# Patient Record
Sex: Female | Born: 1935 | Race: White | Hispanic: No | Marital: Married
Health system: Southern US, Community
[De-identification: ages and names within clinical notes are randomized; demographics above are authoritative.]

## PROBLEM LIST (undated history)

## (undated) DIAGNOSIS — I4891 Unspecified atrial fibrillation: Secondary | ICD-10-CM

## (undated) DIAGNOSIS — K56609 Unspecified intestinal obstruction, unspecified as to partial versus complete obstruction: Secondary | ICD-10-CM

## (undated) DIAGNOSIS — F32A Depression, unspecified: Secondary | ICD-10-CM

## (undated) DIAGNOSIS — Z923 Personal history of irradiation: Secondary | ICD-10-CM

## (undated) DIAGNOSIS — G629 Polyneuropathy, unspecified: Secondary | ICD-10-CM

## (undated) DIAGNOSIS — C55 Malignant neoplasm of uterus, part unspecified: Secondary | ICD-10-CM

## (undated) DIAGNOSIS — Z9049 Acquired absence of other specified parts of digestive tract: Secondary | ICD-10-CM

## (undated) DIAGNOSIS — F329 Major depressive disorder, single episode, unspecified: Secondary | ICD-10-CM

## (undated) DIAGNOSIS — G5602 Carpal tunnel syndrome, left upper limb: Secondary | ICD-10-CM

## (undated) DIAGNOSIS — E559 Vitamin D deficiency, unspecified: Secondary | ICD-10-CM

## (undated) DIAGNOSIS — G4700 Insomnia, unspecified: Secondary | ICD-10-CM

## (undated) DIAGNOSIS — E039 Hypothyroidism, unspecified: Secondary | ICD-10-CM

## (undated) HISTORY — DX: Hypothyroidism, unspecified: E03.9

## (undated) HISTORY — DX: Malignant neoplasm of uterus, part unspecified: C55

## (undated) HISTORY — DX: Acquired absence of other specified parts of digestive tract: Z90.49

## (undated) HISTORY — PX: ABDOMINAL HYSTERECTOMY: SHX81

## (undated) HISTORY — DX: Insomnia, unspecified: G47.00

## (undated) HISTORY — PX: CATARACT EXTRACTION: SUR2

## (undated) HISTORY — DX: Polyneuropathy, unspecified: G62.9

## (undated) HISTORY — DX: Carpal tunnel syndrome, left upper limb: G56.02

## (undated) HISTORY — DX: Major depressive disorder, single episode, unspecified: F32.9

## (undated) HISTORY — DX: Vitamin D deficiency, unspecified: E55.9

## (undated) HISTORY — DX: Personal history of irradiation: Z92.3

## (undated) HISTORY — DX: Depression, unspecified: F32.A

---
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
## 2001-02-10 ENCOUNTER — Other Ambulatory Visit: Admission: RE | Admit: 2001-02-10 | Discharge: 2001-02-10 | Payer: Self-pay | Admitting: Internal Medicine

## 2006-05-12 ENCOUNTER — Emergency Department (HOSPITAL_COMMUNITY): Admission: EM | Admit: 2006-05-12 | Discharge: 2006-05-12 | Payer: Self-pay | Admitting: Emergency Medicine

## 2007-12-10 ENCOUNTER — Emergency Department (HOSPITAL_COMMUNITY): Admission: EM | Admit: 2007-12-10 | Discharge: 2007-12-10 | Payer: Self-pay | Admitting: Emergency Medicine

## 2008-02-13 ENCOUNTER — Encounter: Admission: RE | Admit: 2008-02-13 | Discharge: 2008-03-14 | Payer: Self-pay | Admitting: Neurology

## 2012-01-11 DIAGNOSIS — G63 Polyneuropathy in diseases classified elsewhere: Secondary | ICD-10-CM | POA: Insufficient documentation

## 2012-01-11 DIAGNOSIS — M5137 Other intervertebral disc degeneration, lumbosacral region: Secondary | ICD-10-CM | POA: Insufficient documentation

## 2012-01-11 DIAGNOSIS — G544 Lumbosacral root disorders, not elsewhere classified: Secondary | ICD-10-CM | POA: Insufficient documentation

## 2012-01-11 DIAGNOSIS — R269 Unspecified abnormalities of gait and mobility: Secondary | ICD-10-CM | POA: Insufficient documentation

## 2012-01-11 DIAGNOSIS — R209 Unspecified disturbances of skin sensation: Secondary | ICD-10-CM | POA: Insufficient documentation

## 2012-01-28 ENCOUNTER — Ambulatory Visit: Payer: Medicare HMO

## 2012-01-28 ENCOUNTER — Ambulatory Visit (INDEPENDENT_AMBULATORY_CARE_PROVIDER_SITE_OTHER): Payer: Medicare HMO | Admitting: Family Medicine

## 2012-01-28 VITALS — BP 139/76 | HR 75 | Temp 97.9°F | Resp 16 | Ht 63.0 in | Wt 106.4 lb

## 2012-01-28 DIAGNOSIS — M25572 Pain in left ankle and joints of left foot: Secondary | ICD-10-CM

## 2012-01-28 DIAGNOSIS — S93409A Sprain of unspecified ligament of unspecified ankle, initial encounter: Secondary | ICD-10-CM

## 2012-01-28 DIAGNOSIS — S96919A Strain of unspecified muscle and tendon at ankle and foot level, unspecified foot, initial encounter: Secondary | ICD-10-CM

## 2012-01-28 NOTE — Progress Notes (Signed)
Subjective: Patient has been hurting her left ankle since yesterday. X-rays in the lower left leg just above the ankle. There is no injury. She did go grocery shopping yesterday.  Objective: Mild tenderness of the lower leg laterally along the fibula and Achilles area. Vascular squeezed real hard it hurt her left inner calf, but she is not tender along the vein and she had a negative Homans sign. She thought she was to follow little but I could not appreciate that. No erythema or warmth.[facility] reading (PRIMARY) by  Dr. Sofie Rower of leg was normal. One view of the fibula had little shot of that I think is artifact.  Assessment mild ankle strain  Plan: Swede-O. Ibuprofen 200 3 times a day 2 tablets  Return if worse.

## 2012-01-28 NOTE — Patient Instructions (Signed)
Take ibuprofen 3 times daily for several days.  If increasing pain, redness, or swelling, or generally worsening come back for a recheck. Wear the ankle splint until it is feeling better.

## 2013-05-28 ENCOUNTER — Telehealth: Payer: Self-pay | Admitting: Neurology

## 2013-05-28 NOTE — Telephone Encounter (Signed)
Left vm at contact number requesting that Ms [place] get a referral from her PCP for an appointment with Dr Anne Hahn.  She was last seen by Dr Anne Hahn on 01/01/2010.

## 2013-05-29 NOTE — Telephone Encounter (Signed)
Spoke with patient and confirmed that she was seen by Dr Anne Hahn on 01-11-2012 and that she was to have a f/u appointment in 1 year per Dr Anne Hahn.  I informed her that I would forward this information to Dr Anne Hahn' assistant and our scheduling dept. so that she may be scheduled for a follow-up;  I also informed her that I will be placing her on Dr Anne Hahn' wait-list so that she may be contacted in the event of a cancellation.

## 2013-05-29 NOTE — Telephone Encounter (Signed)
I called and left a message for the patient to callback to the office to schedule an appt.

## 2013-06-04 ENCOUNTER — Encounter: Payer: Self-pay | Admitting: Neurology

## 2013-06-04 DIAGNOSIS — M51379 Other intervertebral disc degeneration, lumbosacral region without mention of lumbar back pain or lower extremity pain: Secondary | ICD-10-CM

## 2013-06-04 DIAGNOSIS — R269 Unspecified abnormalities of gait and mobility: Secondary | ICD-10-CM

## 2013-06-04 DIAGNOSIS — G63 Polyneuropathy in diseases classified elsewhere: Secondary | ICD-10-CM

## 2013-06-04 DIAGNOSIS — M5137 Other intervertebral disc degeneration, lumbosacral region: Secondary | ICD-10-CM

## 2013-06-04 DIAGNOSIS — R209 Unspecified disturbances of skin sensation: Secondary | ICD-10-CM

## 2013-06-04 DIAGNOSIS — G544 Lumbosacral root disorders, not elsewhere classified: Secondary | ICD-10-CM

## 2013-06-05 ENCOUNTER — Encounter: Payer: Self-pay | Admitting: Neurology

## 2013-06-05 ENCOUNTER — Ambulatory Visit (INDEPENDENT_AMBULATORY_CARE_PROVIDER_SITE_OTHER): Payer: Medicare HMO | Admitting: Neurology

## 2013-06-05 VITALS — BP 154/85 | HR 86 | Wt 103.0 lb

## 2013-06-05 DIAGNOSIS — R269 Unspecified abnormalities of gait and mobility: Secondary | ICD-10-CM

## 2013-06-05 DIAGNOSIS — G63 Polyneuropathy in diseases classified elsewhere: Secondary | ICD-10-CM

## 2013-06-05 NOTE — Progress Notes (Signed)
Reason for visit: Peripheral neuropathy  Teresa Gonzalez is an 79 y.o. female  History of present illness:  Teresa Gonzalez is an 79 year old right-handed white female with a history of a peripheral neuropathy. Over time, Teresa Gonzalez indicates that Teresa numbness of Teresa leg has extended, and she now senses Teresa numbness to just below Teresa knees bilaterally. Teresa Gonzalez indicates that she stopped driving approximately one year ago because she could no longer feel her feet. Teresa Gonzalez has had one fall recently within Teresa last 2 weeks when she was coming downstairs, Teresa Gonzalez slipped and landed on her buttocks. Teresa Gonzalez fortunately did not sustain significant injury. Teresa Gonzalez has occasional left knee pain at night with sleeping. Teresa Gonzalez reports some occasional right hip pain with walking. Teresa Gonzalez otherwise has no discomfort with Teresa peripheral neuropathy, and she has never gone medications for Teresa neuropathy pain. Teresa Gonzalez has chronic insomnia unrelated to discomfort.  Past Medical History  Diagnosis Date  . Peripheral neuropathy   . Hypothyroidism   . Depression   . Uterine cancer   . Status post radiation therapy   . S/P small bowel resection     with Diarrhea  . Left carpal tunnel syndrome     By nerve conduction study  . Vitamin D deficiency   . Depression   . Insomnia     Past Surgical History  Procedure Laterality Date  . Cataract extraction Bilateral   . Abdominal hysterectomy      Family History  Problem Relation Age of Onset  . Stroke Mother   . Aneurysm Father   . Diabetes Brother     Social history:  reports that she has never smoked. She has never used smokeless tobacco. She reports that she does not drink alcohol or use illicit drugs.    Allergies  Allergen Reactions  . Codeine Itching  . Penicillins Swelling    Medications:  Current Outpatient Prescriptions on File Prior to Visit  Medication Sig Dispense Refill  . aspirin 81 MG EC tablet Take 81  mg by mouth daily. Swallow whole.      . Calcium Carbonate (CALTRATE 600 PO) Take 600 mg by mouth daily.       Marland Kitchen levothyroxine (SYNTHROID, LEVOTHROID) 25 MCG tablet Take 25 mcg by mouth daily.      Marland Kitchen lisinopril (PRINIVIL,ZESTRIL) 10 MG tablet Take 10 mg by mouth daily.      . Multiple Vitamins-Minerals (MULTIVITAMIN WITH MINERALS) tablet Take 1 tablet by mouth daily.      . raloxifene (EVISTA) 60 MG tablet Take 60 mg by mouth daily.      . Vitamin D, Ergocalciferol, (DRISDOL) 50000 UNITS CAPS Take 50,000 Units by mouth every 7 (seven) days.        No current facility-administered medications on file prior to visit.    ROS:  Out of a complete 14 system review of symptoms, Teresa Gonzalez complains only of Teresa following symptoms, and all other reviewed systems are negative.  Left knee pain Numbness in Teresa feet Gait disturbance  Blood pressure 154/85, pulse 86, weight 103 lb (46.72 kg).  Physical Exam  General: Teresa Gonzalez is alert and cooperative at Teresa time of Teresa examination.  Skin: No significant peripheral edema is noted.   Neurologic Exam  Cranial nerves: Facial symmetry is present. Speech is normal, no aphasia or dysarthria is noted. Extraocular movements are full. Visual fields are full.  Motor: Teresa Gonzalez has good strength in all 4  extremities.  Coordination: Teresa Gonzalez has good finger-nose-finger and heel-to-shin bilaterally.  Gait and station: Teresa Gonzalez has a slightly wide-based, unsteady gait. Teresa Gonzalez uses a walker for ambulation. Tandem gait was not attempted. Romberg is negative. No drift is seen.  Reflexes: Deep tendon reflexes are symmetric, but are depressed.   Assessment/Plan:  One. Peripheral neuropathy  2. Gait disturbance  Teresa Gonzalez is safe when using a walker. Teresa Gonzalez has assistance in Teresa home environment. Teresa Gonzalez is not having discomfort with Teresa neuropathy, and there is no indication for medical therapy. Teresa Gonzalez is no longer  operating a motor vehicle. Teresa Gonzalez will followup in one year.  Marlan Palau MD 06/05/2013 12:59 PM  [facility] [address] [place], [place] [address]  Phone [phone] Fax [phone]

## 2013-10-20 ENCOUNTER — Encounter (HOSPITAL_COMMUNITY): Payer: Self-pay | Admitting: Emergency Medicine

## 2013-10-20 ENCOUNTER — Inpatient Hospital Stay (HOSPITAL_COMMUNITY)
Admission: EM | Admit: 2013-10-20 | Discharge: 2013-10-23 | DRG: 389 | Disposition: A | Discharge status: Home / Self Care | Payer: Medicare HMO | Attending: Internal Medicine | Admitting: Internal Medicine

## 2013-10-20 ENCOUNTER — Emergency Department (HOSPITAL_COMMUNITY): Payer: Medicare HMO

## 2013-10-20 DIAGNOSIS — I10 Essential (primary) hypertension: Secondary | ICD-10-CM | POA: Diagnosis present

## 2013-10-20 DIAGNOSIS — R636 Underweight: Secondary | ICD-10-CM | POA: Diagnosis present

## 2013-10-20 DIAGNOSIS — G5600 Carpal tunnel syndrome, unspecified upper limb: Secondary | ICD-10-CM | POA: Diagnosis present

## 2013-10-20 DIAGNOSIS — M810 Age-related osteoporosis without current pathological fracture: Secondary | ICD-10-CM | POA: Diagnosis present

## 2013-10-20 DIAGNOSIS — Z9049 Acquired absence of other specified parts of digestive tract: Secondary | ICD-10-CM

## 2013-10-20 DIAGNOSIS — Z7982 Long term (current) use of aspirin: Secondary | ICD-10-CM

## 2013-10-20 DIAGNOSIS — K560 Paralytic ileus: Secondary | ICD-10-CM | POA: Diagnosis present

## 2013-10-20 DIAGNOSIS — Z90710 Acquired absence of both cervix and uterus: Secondary | ICD-10-CM

## 2013-10-20 DIAGNOSIS — E039 Hypothyroidism, unspecified: Secondary | ICD-10-CM | POA: Diagnosis present

## 2013-10-20 DIAGNOSIS — K56609 Unspecified intestinal obstruction, unspecified as to partial versus complete obstruction: Principal | ICD-10-CM

## 2013-10-20 DIAGNOSIS — Z880 Allergy status to penicillin: Secondary | ICD-10-CM

## 2013-10-20 DIAGNOSIS — F3289 Other specified depressive episodes: Secondary | ICD-10-CM | POA: Diagnosis present

## 2013-10-20 DIAGNOSIS — K912 Postsurgical malabsorption, not elsewhere classified: Secondary | ICD-10-CM | POA: Diagnosis present

## 2013-10-20 DIAGNOSIS — Z923 Personal history of irradiation: Secondary | ICD-10-CM

## 2013-10-20 DIAGNOSIS — G4700 Insomnia, unspecified: Secondary | ICD-10-CM | POA: Diagnosis present

## 2013-10-20 DIAGNOSIS — R197 Diarrhea, unspecified: Secondary | ICD-10-CM | POA: Diagnosis present

## 2013-10-20 DIAGNOSIS — E559 Vitamin D deficiency, unspecified: Secondary | ICD-10-CM | POA: Diagnosis present

## 2013-10-20 DIAGNOSIS — Z681 Body mass index (BMI) 19 or less, adult: Secondary | ICD-10-CM

## 2013-10-20 DIAGNOSIS — Z79899 Other long term (current) drug therapy: Secondary | ICD-10-CM

## 2013-10-20 DIAGNOSIS — Z8542 Personal history of malignant neoplasm of other parts of uterus: Secondary | ICD-10-CM

## 2013-10-20 DIAGNOSIS — Z885 Allergy status to narcotic agent status: Secondary | ICD-10-CM

## 2013-10-20 DIAGNOSIS — G609 Hereditary and idiopathic neuropathy, unspecified: Secondary | ICD-10-CM | POA: Diagnosis present

## 2013-10-20 DIAGNOSIS — F329 Major depressive disorder, single episode, unspecified: Secondary | ICD-10-CM | POA: Diagnosis present

## 2013-10-20 LAB — CBC WITH DIFFERENTIAL/PLATELET
BASOS PCT: 0 % (ref 0–1)
Basophils Absolute: 0 10*3/uL (ref 0.0–0.1)
EOS ABS: 0.1 10*3/uL (ref 0.0–0.7)
Eosinophils Relative: 1 % (ref 0–5)
HEMATOCRIT: 42.7 % (ref 36.0–46.0)
HEMOGLOBIN: 15 g/dL (ref 12.0–15.0)
Lymphocytes Relative: 17 % (ref 12–46)
Lymphs Abs: 1.7 10*3/uL (ref 0.7–4.0)
MCH: 31.3 pg (ref 26.0–34.0)
MCHC: 35.1 g/dL (ref 30.0–36.0)
MCV: 89.1 fL (ref 78.0–100.0)
MONO ABS: 0.7 10*3/uL (ref 0.1–1.0)
MONOS PCT: 8 % (ref 3–12)
Neutro Abs: 7.3 10*3/uL (ref 1.7–7.7)
Neutrophils Relative %: 74 % (ref 43–77)
Platelets: 316 10*3/uL (ref 150–400)
RBC: 4.79 MIL/uL (ref 3.87–5.11)
RDW: 13 % (ref 11.5–15.5)
WBC: 9.8 10*3/uL (ref 4.0–10.5)

## 2013-10-20 LAB — POCT I-STAT TROPONIN I: Troponin i, poc: 0 ng/mL (ref 0.00–0.08)

## 2013-10-20 LAB — URINE MICROSCOPIC-ADD ON

## 2013-10-20 LAB — COMPREHENSIVE METABOLIC PANEL
ALBUMIN: 3.7 g/dL (ref 3.5–5.2)
ALT: 15 U/L (ref 0–35)
AST: 19 U/L (ref 0–37)
Alkaline Phosphatase: 64 U/L (ref 39–117)
BILIRUBIN TOTAL: 0.6 mg/dL (ref 0.3–1.2)
BUN: 13 mg/dL (ref 6–23)
CO2: 27 mEq/L (ref 19–32)
CREATININE: 0.58 mg/dL (ref 0.50–1.10)
Calcium: 9.4 mg/dL (ref 8.4–10.5)
Chloride: 93 mEq/L — ABNORMAL LOW (ref 96–112)
GFR calc Af Amer: >90 mL/min (ref >90)
GFR calc non Af Amer: 79 mL/min — ABNORMAL LOW (ref >90)
Glucose, Bld: 132 mg/dL — ABNORMAL HIGH (ref 70–99)
Potassium: 3.9 mEq/L (ref 3.7–5.3)
Sodium: 133 mEq/L — ABNORMAL LOW (ref 137–147)
TOTAL PROTEIN: 6.8 g/dL (ref 6.0–8.3)

## 2013-10-20 LAB — URINALYSIS, ROUTINE W REFLEX MICROSCOPIC
Glucose, UA: NEGATIVE mg/dL
Hgb urine dipstick: NEGATIVE
KETONES UR: NEGATIVE mg/dL
NITRITE: NEGATIVE
PH: 6 (ref 5.0–8.0)
Protein, ur: NEGATIVE mg/dL
SPECIFIC GRAVITY, URINE: 1.021 (ref 1.005–1.030)
UROBILINOGEN UA: 0.2 mg/dL (ref 0.0–1.0)

## 2013-10-20 LAB — LIPASE, BLOOD: LIPASE: 47 U/L (ref 11–59)

## 2013-10-20 LAB — CG4 I-STAT (LACTIC ACID): Lactic Acid, Venous: 1.92 mmol/L (ref 0.5–2.2)

## 2013-10-20 MED ORDER — ONDANSETRON HCL 4 MG PO TABS: 4.0000 mg | ORAL_TABLET | Freq: Four times a day (QID) | ORAL | Status: DC | PRN

## 2013-10-20 MED ORDER — SODIUM CHLORIDE 0.9 % IV SOLN
INTRAVENOUS | Status: AC
  Administered 2013-10-20: 18:00:00 via INTRAVENOUS
  Administered 2013-10-21: 1000 mL via INTRAVENOUS
  Administered 2013-10-21 – 2013-10-22 (×2): via INTRAVENOUS

## 2013-10-20 MED ORDER — HEPARIN SODIUM (PORCINE) 5000 UNIT/ML IJ SOLN
5000.0000 [IU] | Freq: Three times a day (TID) | INTRAMUSCULAR | Status: DC
  Administered 2013-10-20 – 2013-10-23 (×8): 5000 [IU] via SUBCUTANEOUS
  Filled 2013-10-20 (×11): qty 1

## 2013-10-20 MED ORDER — PANTOPRAZOLE SODIUM 40 MG IV SOLR
40.0000 mg | INTRAVENOUS | Status: DC
Start: 2013-10-20 — End: 2013-10-21
  Administered 2013-10-20: 40 mg via INTRAVENOUS
  Filled 2013-10-20 (×2): qty 40

## 2013-10-20 MED ORDER — ONDANSETRON HCL 4 MG/2ML IJ SOLN
4.0000 mg | Freq: Once | INTRAMUSCULAR | Status: AC
  Administered 2013-10-20: 4 mg via INTRAVENOUS

## 2013-10-20 MED ORDER — FENTANYL CITRATE 0.05 MG/ML IJ SOLN
50.0000 ug | Freq: Once | INTRAMUSCULAR | Status: AC
  Administered 2013-10-20: 50 ug via INTRAVENOUS
  Filled 2013-10-20: qty 2

## 2013-10-20 MED ORDER — ONDANSETRON HCL 4 MG/2ML IJ SOLN
4.0000 mg | Freq: Four times a day (QID) | INTRAMUSCULAR | Status: DC | PRN
  Filled 2013-10-20: qty 2

## 2013-10-20 MED ORDER — MORPHINE SULFATE 2 MG/ML IJ SOLN
1.0000 mg | INTRAMUSCULAR | Status: DC | PRN
  Administered 2013-10-20: 1 mg via INTRAVENOUS
  Filled 2013-10-20: qty 1

## 2013-10-20 NOTE — Consult Note (Signed)
Reason for Consult:  Small bowel obstruction Referring Physician:   Dr. Shon Baton  Teresa Gonzalez is an 80 y.o. female.  HPI:  Last night, she developed some discomfort in the right lower abdominal area as well as epigastric pain and burning. She has had some episodes like this before. The pain was persistent and severe at times. She had one episode of nausea with some vomiting.  Her bowels have still been moving. She does have chronic diarrhea and takes Imodium for this. She's not noted a significant change in her bowel habits. She presented to the emergency department for evaluation. Abdominal x-rays were suggestive of a partial small bowel obstruction and she has been admitted for this. She has a history of a small bowel resection back in 1989 as well as a hysterectomy for malignancy in 1969. She underwent radiation treatment after the hysterectomy.  Past Medical History  Diagnosis Date  . Peripheral neuropathy   . Hypothyroidism   . Depression   . Uterine cancer   . Status post radiation therapy   . S/P small bowel resection     with Diarrhea  . Left carpal tunnel syndrome     By nerve conduction study  . Vitamin D deficiency   . Depression   . Insomnia     Past Surgical History  Procedure Laterality Date  . Cataract extraction Bilateral   . Abdominal hysterectomy      Family History  Problem Relation Age of Onset  . Stroke Mother   . Aneurysm Father   . Diabetes Brother     Social History:  reports that she has never smoked. She has never used smokeless tobacco. She reports that she does not drink alcohol or use illicit drugs.  Allergies:  Allergies  Allergen Reactions  . Codeine Itching  . Penicillins Swelling    Prior to Admission medications   Medication Sig Start Date End Date Taking? Authorizing Provider  aspirin 81 MG EC tablet Take 81 mg by mouth daily. Swallow whole.   Yes Historical Provider, MD  Calcium Carbonate (CALTRATE 600 PO) Take 600 mg by mouth  daily.    Yes Historical Provider, MD  famotidine-calcium carbonate-magnesium hydroxide (ACID CONTROLLER COMPLETE) 10-800-165 MG CHEW chewable tablet Chew 1 tablet by mouth daily as needed.   Yes Historical Provider, MD  levothyroxine (SYNTHROID, LEVOTHROID) 25 MCG tablet Take 25 mcg by mouth daily.   Yes Historical Provider, MD  lisinopril (PRINIVIL,ZESTRIL) 10 MG tablet Take 10 mg by mouth daily.   Yes Historical Provider, MD  loperamide (IMODIUM) 2 MG capsule Take 16 mg by mouth daily.   Yes Historical Provider, MD  Multiple Vitamins-Minerals (MULTIVITAMIN WITH MINERALS) tablet Take 1 tablet by mouth daily.   Yes Historical Provider, MD  raloxifene (EVISTA) 60 MG tablet Take 60 mg by mouth daily.   Yes Historical Provider, MD  Vitamin D, Ergocalciferol, (DRISDOL) 50000 UNITS CAPS Take 50,000 Units by mouth every 7 (seven) days.    Yes Historical Provider, MD     Results for orders placed during the hospital encounter of 10/20/13 (from the past 48 hour(s))  CBC WITH DIFFERENTIAL     Status: None   Collection Time    10/20/13  4:10 PM      Result Value Range   WBC 9.8  4.0 - 10.5 K/uL   RBC 4.79  3.87 - 5.11 MIL/uL   Hemoglobin 15.0  12.0 - 15.0 g/dL   HCT 42.7  36.0 - 46.0 %  MCV 89.1  78.0 - 100.0 fL   MCH 31.3  26.0 - 34.0 pg   MCHC 35.1  30.0 - 36.0 g/dL   RDW 13.0  11.5 - 15.5 %   Platelets 316  150 - 400 K/uL   Neutrophils Relative % 74  43 - 77 %   Neutro Abs 7.3  1.7 - 7.7 K/uL   Lymphocytes Relative 17  12 - 46 %   Lymphs Abs 1.7  0.7 - 4.0 K/uL   Monocytes Relative 8  3 - 12 %   Monocytes Absolute 0.7  0.1 - 1.0 K/uL   Eosinophils Relative 1  0 - 5 %   Eosinophils Absolute 0.1  0.0 - 0.7 K/uL   Basophils Relative 0  0 - 1 %   Basophils Absolute 0.0  0.0 - 0.1 K/uL  COMPREHENSIVE METABOLIC PANEL     Status: Abnormal   Collection Time    10/20/13  4:10 PM      Result Value Range   Sodium 133 (*) 137 - 147 mEq/L   Potassium 3.9  3.7 - 5.3 mEq/L   Chloride 93 (*) 96 -  112 mEq/L   CO2 27  19 - 32 mEq/L   Glucose, Bld 132 (*) 70 - 99 mg/dL   BUN 13  6 - 23 mg/dL   Creatinine, Ser 0.58  0.50 - 1.10 mg/dL   Calcium 9.4  8.4 - 10.5 mg/dL   Total Protein 6.8  6.0 - 8.3 g/dL   Albumin 3.7  3.5 - 5.2 g/dL   AST 19  0 - 37 U/L   ALT 15  0 - 35 U/L   Alkaline Phosphatase 64  39 - 117 U/L   Total Bilirubin 0.6  0.3 - 1.2 mg/dL   GFR calc non Af Amer 79 (*) >90 mL/min   GFR calc Af Amer >90  >90 mL/min   Comment: (NOTE)     The eGFR has been calculated using the CKD EPI equation.     This calculation has not been validated in all clinical situations.     eGFR's persistently <90 mL/min signify possible Chronic Kidney     Disease.  LIPASE, BLOOD     Status: None   Collection Time    10/20/13  4:10 PM      Result Value Range   Lipase 47  11 - 59 U/L  POCT I-STAT TROPONIN I     Status: None   Collection Time    10/20/13  4:17 PM      Result Value Range   Troponin i, poc 0.00  0.00 - 0.08 ng/mL   Comment 3            Comment: Due to the release kinetics of cTnI,     a negative result within the first hours     of the onset of symptoms does not rule out     myocardial infarction with certainty.     If myocardial infarction is still suspected,     repeat the test at appropriate intervals.  CG4 I-STAT (LACTIC ACID)     Status: None   Collection Time    10/20/13  4:19 PM      Result Value Range   Lactic Acid, Venous 1.92  0.5 - 2.2 mmol/L  URINALYSIS, ROUTINE W REFLEX MICROSCOPIC     Status: Abnormal   Collection Time    10/20/13  4:36 PM      Result Value Range  Color, Urine AMBER (*) YELLOW   Comment: BIOCHEMICALS MAY BE AFFECTED BY COLOR   APPearance CLOUDY (*) CLEAR   Specific Gravity, Urine 1.021  1.005 - 1.030   pH 6.0  5.0 - 8.0   Glucose, UA NEGATIVE  NEGATIVE mg/dL   Hgb urine dipstick NEGATIVE  NEGATIVE   Bilirubin Urine SMALL (*) NEGATIVE   Ketones, ur NEGATIVE  NEGATIVE mg/dL   Protein, ur NEGATIVE  NEGATIVE mg/dL   Urobilinogen, UA  0.2  0.0 - 1.0 mg/dL   Nitrite NEGATIVE  NEGATIVE   Leukocytes, UA SMALL (*) NEGATIVE  URINE MICROSCOPIC-ADD ON     Status: Abnormal   Collection Time    10/20/13  4:36 PM      Result Value Range   WBC, UA 0-2  <3 WBC/hpf   Bacteria, UA FEW (*) RARE   Crystals CA OXALATE CRYSTALS (*) NEGATIVE    Dg Abd Acute W/chest  10/20/2013   CLINICAL DATA:  Abdominal pain, history of uterine cancer  EXAM: ACUTE ABDOMEN SERIES (ABDOMEN 2 VIEW & CHEST 1 VIEW)  COMPARISON:  None.  FINDINGS: Cardiomediastinal silhouette is unremarkable. No acute infiltrate or pleural effusion. No pulmonary edema. Mild elevation of the right hemidiaphragm. Moderate stool in right colon and sigmoid colon. Distended small bowel loops with air-fluid levels in right abdomen highly suspicious for bowel obstruction. No free abdominal air.  IMPRESSION: No acute disease within chest. Distended small bowel loops in right abdomen with air-fluid levels highly suspicious for small bowel obstruction. No free abdominal air.   Electronically Signed   By: Lahoma Crocker M.D.   On: 10/20/2013 16:00    Review of Systems  Constitutional: Negative for fever and chills.  Respiratory: Positive for cough. Negative for shortness of breath.   Cardiovascular: Positive for chest pain (Burning pain).  Gastrointestinal: Positive for nausea, vomiting and diarrhea. Negative for blood in stool.  Genitourinary: Negative for dysuria and hematuria.   Blood pressure 155/92, pulse 93, temperature 98.2 F (36.8 C), temperature source Oral, resp. rate 20, height '5\' 4"'  (1.626 m), weight 103 lb (46.72 kg), SpO2 98.00%. Physical Exam  Constitutional: No distress.  Thin, elderly female.  HENT:  Head: Normocephalic and atraumatic.  Eyes: No scleral icterus.  Cardiovascular: Normal rate and regular rhythm.   Respiratory: Effort normal and breath sounds normal.  GI: Soft. Bowel sounds are normal. She exhibits no distension and no mass. There is no tenderness.   Lower midline scar. No hernias.  Genitourinary:  No palpable groin hernias.    Assessment/Plan: Abdominal pain with some nausea and one episode of small volume emesis. She's had multiple abdominal surgeries as well as pelvic radiation. X-rays suggest partial small bowel obstruction. No clinical evidence of ischemia.  Recommendation: I agree with bowel rest. If this does not improve quickly with medical management, would recommend a CT scan with oral and IV contrast.  Moishy Laday J 10/20/2013, 7:39 PM

## 2013-10-20 NOTE — ED Provider Notes (Signed)
Medical screening examination/treatment/procedure(s) were conducted as a shared visit with non-physician practitioner(s) and myself.  I personally evaluated the patient during the encounter.  EKG Interpretation    Date/Time:  Saturday October 20 2013 14:53:54 EST Ventricular Rate:  122 PR Interval:  202 QRS Duration: 122 QT Interval:  410 QTC Calculation: 584 R Axis:   -54 Text Interpretation:  Sinus tachycardia Borderline prolonged PR interval LAE, consider biatrial enlargement Left bundle branch block Baseline wander in lead(s) I III aVL V4 Confirmed by Zenia Resides  MD, Fawna Cranmer ([id number]) on 10/20/2013 3:22:26 PM             Leota Jacobsen, MD 10/20/13 [phone]

## 2013-10-20 NOTE — ED Notes (Signed)
Pt complains of lower right abdominal pain since yesterday sharp in nature.. Pt also  complains of epigastric burning. Pt was given 4 mg of Zofran and 50 meq of fentanyl in route by EMS.  Pt states she had a small bowel movement this AM which is abnormal for her because she usually has diarrhea daily and has to take 12 of imodium for her symptoms. Pt states she has chronic abdominal pain but this pain  has lasted longer than usual.

## 2013-10-20 NOTE — ED Provider Notes (Signed)
Medical screening examination/treatment/procedure(s) were performed by non-physician practitioner and as supervising physician I was immediately available for consultation/collaboration.  EKG Interpretation    Date/Time:  Saturday October 20 2013 14:53:54 EST Ventricular Rate:  122 PR Interval:  202 QRS Duration: 122 QT Interval:  410 QTC Calculation: 584 R Axis:   -54 Text Interpretation:  Sinus tachycardia Borderline prolonged PR interval LAE, consider biatrial enlargement Left bundle branch block Baseline wander in lead(s) I III aVL V4 Confirmed by Stiven Kaspar  MD, Edynn Gillock ([id number]) on 10/20/2013 3:22:26 PM            Pt without signs of acute abd , has sbo and will be admitted by medicine  Leota Jacobsen, MD 10/20/13 1725

## 2013-10-20 NOTE — Progress Notes (Signed)
Called to get report and Nurse unavailable to give report.

## 2013-10-20 NOTE — ED Provider Notes (Signed)
CSN: [id number]     Arrival date & time 10/20/13  1441 History   First MD Initiated Contact with Patient 10/20/13 1514     No chief complaint on file.  (Consider location/radiation/quality/duration/timing/severity/associated sxs/prior Treatment) HPI Comments: Patient with h/o small bowel resection, uterine cancer s/p radiation and abdominal hysterectomy, appendectomy (reported by patient) -- presents with c/o lower R abdominal pain and epigastric chest burning sensation starting last night. Patient took Tums without relief. She had vomited overnight. Patient has daily diarrhea but had a slightly hard, small BM this morning. She denies fever, dysuria. EMS was called and patient received 17mcg fentanyl and 4mg  zofran en route which has resolved pain. No history of heart or lung problems. The onset of this condition was acute. The course is constant. Aggravating factors: none. Alleviating factors: none.    The history is provided by the patient and medical records.    Past Medical History  Diagnosis Date  . Peripheral neuropathy   . Hypothyroidism   . Depression   . Uterine cancer   . Status post radiation therapy   . S/P small bowel resection     with Diarrhea  . Left carpal tunnel syndrome     By nerve conduction study  . Vitamin D deficiency   . Depression   . Insomnia    Past Surgical History  Procedure Laterality Date  . Cataract extraction Bilateral   . Abdominal hysterectomy     Family History  Problem Relation Age of Onset  . Stroke Mother   . Aneurysm Father   . Diabetes Brother    History  Substance Use Topics  . Smoking status: Never Smoker   . Smokeless tobacco: Never Used  . Alcohol Use: No   OB History   Grav Para Term Preterm Abortions TAB SAB Ect Mult Living                 Review of Systems  Constitutional: Negative for fever.  HENT: Negative for rhinorrhea and sore throat.   Eyes: Negative for redness.  Respiratory: Negative for cough.    Cardiovascular: Negative for chest pain.  Gastrointestinal: Positive for nausea, vomiting and abdominal pain. Negative for diarrhea and blood in stool.  Genitourinary: Negative for dysuria.  Musculoskeletal: Negative for myalgias.  Skin: Negative for rash.  Neurological: Negative for headaches.    Allergies  Codeine and Penicillins  Home Medications   Current Outpatient Rx  Name  Route  Sig  Dispense  Refill  . aspirin 81 MG EC tablet   Oral   Take 81 mg by mouth daily. Swallow whole.         . Calcium Carbonate (CALTRATE 600 PO)   Oral   Take 600 mg by mouth daily.          . famotidine-calcium carbonate-magnesium hydroxide (ACID CONTROLLER COMPLETE) 10-800-165 MG CHEW chewable tablet   Oral   Chew 1 tablet by mouth daily as needed.         Marland Kitchen levothyroxine (SYNTHROID, LEVOTHROID) 25 MCG tablet   Oral   Take 25 mcg by mouth daily.         Marland Kitchen lisinopril (PRINIVIL,ZESTRIL) 10 MG tablet   Oral   Take 10 mg by mouth daily.         Marland Kitchen loperamide (IMODIUM) 2 MG capsule   Oral   Take 16 mg by mouth daily.         . Multiple Vitamins-Minerals (MULTIVITAMIN WITH MINERALS) tablet   Oral  Take 1 tablet by mouth daily.         . raloxifene (EVISTA) 60 MG tablet   Oral   Take 60 mg by mouth daily.         . Vitamin D, Ergocalciferol, (DRISDOL) 50000 UNITS CAPS   Oral   Take 50,000 Units by mouth every 7 (seven) days.           BP 164/96  Pulse 123  Temp(Src) 97.7 F (36.5 C) (Oral)  Resp 18  SpO2 98% Physical Exam  Nursing note and vitals reviewed. Constitutional: She appears well-developed and well-nourished.  HENT:  Head: Normocephalic and atraumatic.  Eyes: Conjunctivae are normal. Right eye exhibits no discharge. Left eye exhibits no discharge.  Neck: Normal range of motion. Neck supple.  Cardiovascular: Normal rate, regular rhythm and normal heart sounds.   Pulmonary/Chest: Effort normal and breath sounds normal.  Abdominal: Soft. Bowel  sounds are normal. She exhibits no distension. There is no tenderness. There is no rebound and no guarding.  Neurological: She is alert.  Skin: Skin is warm and dry.  Psychiatric: She has a normal mood and affect.    ED Course  Procedures (including critical care time) Labs Review Labs Reviewed  COMPREHENSIVE METABOLIC PANEL - Abnormal; Notable for the following:    Sodium 133 (*)    Chloride 93 (*)    Glucose, Bld 132 (*)    GFR calc non Af Amer 79 (*)    All other components within normal limits  URINALYSIS, ROUTINE W REFLEX MICROSCOPIC - Abnormal; Notable for the following:    Color, Urine AMBER (*)    APPearance CLOUDY (*)    Bilirubin Urine SMALL (*)    Leukocytes, UA SMALL (*)    All other components within normal limits  URINE MICROSCOPIC-ADD ON - Abnormal; Notable for the following:    Bacteria, UA FEW (*)    Crystals CA OXALATE CRYSTALS (*)    All other components within normal limits  CBC WITH DIFFERENTIAL  LIPASE, BLOOD  BASIC METABOLIC PANEL  CBC  CG4 I-STAT (LACTIC ACID)  POCT I-STAT TROPONIN I   Imaging Review Dg Abd Acute W/chest  10/20/2013   CLINICAL DATA:  Abdominal pain, history of uterine cancer  EXAM: ACUTE ABDOMEN SERIES (ABDOMEN 2 VIEW & CHEST 1 VIEW)  COMPARISON:  None.  FINDINGS: Cardiomediastinal silhouette is unremarkable. No acute infiltrate or pleural effusion. No pulmonary edema. Mild elevation of the right hemidiaphragm. Moderate stool in right colon and sigmoid colon. Distended small bowel loops with air-fluid levels in right abdomen highly suspicious for bowel obstruction. No free abdominal air.  IMPRESSION: No acute disease within chest. Distended small bowel loops in right abdomen with air-fluid levels highly suspicious for small bowel obstruction. No free abdominal air.   Electronically Signed   By: Lahoma Crocker M.D.   On: 10/20/2013 16:00    EKG Interpretation    Date/Time:  Saturday October 20 2013 14:53:54 EST Ventricular Rate:  122 PR  Interval:  202 QRS Duration: 122 QT Interval:  410 QTC Calculation: 584 R Axis:   -54 Text Interpretation:  Sinus tachycardia Borderline prolonged PR interval LAE, consider biatrial enlargement Left bundle branch block Baseline wander in lead(s) I III aVL V4 Confirmed by Zenia Resides  MD, ANTHONY ([id number]) on 10/20/2013 3:22:26 PM           3:31 PM Patient seen and examined. Work-up initiated. Medications ordered.   Vital signs reviewed and are as follows: Filed Vitals:  10/20/13 1452  BP:   Pulse:   Temp: 97.7 F (36.5 C)  Resp:   BP 164/96  Pulse 123  Temp(Src) 97.7 F (36.5 C) (Oral)  Resp 18  SpO2 98%  4:11 PM GMA already in ED and notified of patient. Patient discussed with and seen by Dr. Zenia Resides. They will see and admit.   4:52 PM Remainder of labs reviewed (other than urine which has not returned). No other acute medical issues identified.   MDM   1. Small bowel obstruction    Admit for treatment of SBO.     Carlisle Cater, PA-C 10/20/13 1724

## 2013-10-20 NOTE — ED Notes (Signed)
Patient transported to X-ray 

## 2013-10-20 NOTE — ED Notes (Signed)
Bed: WA01 Expected date:  Expected time:  Means of arrival:  Comments: EMS- elderly abdominal pain

## 2013-10-20 NOTE — H&P (Addendum)
Teresa Gonzalez is an 80 y.o. female.   PCP:   [name], MD   Chief Complaint:  Ab pain  HPI: 35 F s/p Hysterectomy for uterine CA followed c XRT.  She had partial Small Bowel resection many yrs ago and has had chronic diarrhea since - due to short gut syndrome.  She also has developed a peripheral neuropathy as well. She has been doing well in her usual state of health until yesterday when she was not quite right.  Last night she started having Ab discomfort and pains.  She had one small BM this am.  She held her imodium.  Tums was unhelpful. The pains are described as lower right abdominal pain and epigastric burning. The pains progressed and worsened and she called EMS. Pt was given 4 mg of Zofran and 50 meq of fentanyl in route by EMS. She denies fever, dysuria.  No history of heart or lung problems. She has not done anything out of the ordinary.  In ED labs were unremarkable.  No Pancreatitis and AAS showed No acute disease within chest. Distended small bowel loops in right abdomen with air-fluid levels highly suspicious for small bowel obstruction. No free abdominal air.  For the SBO she will be admitted for eval and Rx.  She is currently well hydrated and HD stable.  She is mostly comfortable with intermittent waves of discomfort.  NGT is not in.     Past Medical History:  Past Medical History  Diagnosis Date  . Peripheral neuropathy   . Hypothyroidism   . Depression   . Uterine cancer   . Status post radiation therapy   . S/P small bowel resection     with Diarrhea  . Left carpal tunnel syndrome     By nerve conduction study  . Vitamin D deficiency   . Depression   . Insomnia     Past Surgical History  Procedure Laterality Date  . Cataract extraction Bilateral   . Abdominal hysterectomy        Allergies:   Allergies  Allergen Reactions  . Codeine Itching  . Penicillins Swelling     Medications: Prior to Admission medications   Medication Sig Start Date End  Date Taking? Authorizing Provider  aspirin 81 MG EC tablet Take 81 mg by mouth daily. Swallow whole.   Yes Historical Provider, MD  Calcium Carbonate (CALTRATE 600 PO) Take 600 mg by mouth daily.    Yes Historical Provider, MD  famotidine-calcium carbonate-magnesium hydroxide (ACID CONTROLLER COMPLETE) [phone] MG CHEW chewable tablet Chew 1 tablet by mouth daily as needed.   Yes Historical Provider, MD  levothyroxine (SYNTHROID, LEVOTHROID) 25 MCG tablet Take 25 mcg by mouth daily.   Yes Historical Provider, MD  lisinopril (PRINIVIL,ZESTRIL) 10 MG tablet Take 10 mg by mouth daily.   Yes Historical Provider, MD  loperamide (IMODIUM) 2 MG capsule Take 16 mg by mouth daily.   Yes Historical Provider, MD  Multiple Vitamins-Minerals (MULTIVITAMIN WITH MINERALS) tablet Take 1 tablet by mouth daily.   Yes Historical Provider, MD  raloxifene (EVISTA) 60 MG tablet Take 60 mg by mouth daily.   Yes Historical Provider, MD  Vitamin D, Ergocalciferol, (DRISDOL) 50000 UNITS CAPS Take 50,000 Units by mouth every 7 (seven) days.    Yes Historical Provider, MD      (Not in a hospital admission)   Social History:  reports that she has never smoked. She has never used smokeless tobacco. She reports that she does not  drink alcohol or use illicit drugs.  Family History: Family History  Problem Relation Age of Onset  . Stroke Mother   . Aneurysm Father   . Diabetes Brother     Review of Systems:  Review of Systems - See HPI. Denies CP/SOB. No bladder issues. All ROS done.  Physical Exam:  Blood pressure 145/80, pulse 85, temperature 97.9 F (36.6 C), temperature source Oral, resp. rate 19, SpO2 98.00%. Filed Vitals:   10/20/13 1450 10/20/13 1452 10/20/13 1556 10/20/13 1645  BP: 164/96  164/82 145/80  Pulse: 123  83 85  Temp:  97.7 F (36.5 C) 97.9 F (36.6 C)   TempSrc:  Oral Oral   Resp: 18  19 19   SpO2: 98%  97% 98%   General appearance: Elderly but looks younger than stated age. Head:  Normocephalic, without obvious abnormality, atraumatic Eyes: conjunctivae/corneas clear. PERRL, EOM's intact.  Nose: Nares normal. Septum midline. Mucosa normal. No drainage or sinus tenderness. Throat: lips, mucosa, and tongue normal; teeth and gums normal Neck: no adenopathy, no carotid bruit, no JVD and thyroid not enlarged, symmetric, no tenderness/mass/nodules Resp: CTA Cardio: Reg GI: Ab scars noted.  Softer than expected.  Intermittently tender.  No Reb/Guarding Extremities: extremities normal, atraumatic, no cyanosis or edema Pulses: 2+ and symmetric Lymph nodes: no cervical lymphadenopathy Neurologic: Alert and oriented X 3, normal strength and tone. Normal symmetric reflexes.     Labs on Admission:   Recent Labs  10/20/13 1610  NA 133*  K 3.9  CL 93*  CO2 27  GLUCOSE 132*  BUN 13  CREATININE 0.58  CALCIUM 9.4    Recent Labs  10/20/13 1610  AST 19  ALT 15  ALKPHOS 64  BILITOT 0.6  PROT 6.8  ALBUMIN 3.7    Recent Labs  10/20/13 1610  LIPASE 47    Recent Labs  10/20/13 1610  WBC 9.8  NEUTROABS 7.3  HGB 15.0  HCT 42.7  MCV 89.1  PLT 316   No results found for this basename: CKTOTAL, CKMB, CKMBINDEX, TROPONINI,  in the last 72 hours No results found for this basename: INR,  PROTIME     LAB RESULT POCT:  Results for orders placed during the hospital encounter of 10/20/13  CBC WITH DIFFERENTIAL      Result Value Range   WBC 9.8  4.0 - 10.5 K/uL   RBC 4.79  3.87 - 5.11 MIL/uL   Hemoglobin 15.0  12.0 - 15.0 g/dL   HCT 42.7  36.0 - 46.0 %   MCV 89.1  78.0 - 100.0 fL   MCH 31.3  26.0 - 34.0 pg   MCHC 35.1  30.0 - 36.0 g/dL   RDW 13.0  11.5 - 15.5 %   Platelets 316  150 - 400 K/uL   Neutrophils Relative % 74  43 - 77 %   Neutro Abs 7.3  1.7 - 7.7 K/uL   Lymphocytes Relative 17  12 - 46 %   Lymphs Abs 1.7  0.7 - 4.0 K/uL   Monocytes Relative 8  3 - 12 %   Monocytes Absolute 0.7  0.1 - 1.0 K/uL   Eosinophils Relative 1  0 - 5 %    Eosinophils Absolute 0.1  0.0 - 0.7 K/uL   Basophils Relative 0  0 - 1 %   Basophils Absolute 0.0  0.0 - 0.1 K/uL  COMPREHENSIVE METABOLIC PANEL      Result Value Range   Sodium 133 (*) 137 - 147  mEq/L   Potassium 3.9  3.7 - 5.3 mEq/L   Chloride 93 (*) 96 - 112 mEq/L   CO2 27  19 - 32 mEq/L   Glucose, Bld 132 (*) 70 - 99 mg/dL   BUN 13  6 - 23 mg/dL   Creatinine, Ser 0.58  0.50 - 1.10 mg/dL   Calcium 9.4  8.4 - 10.5 mg/dL   Total Protein 6.8  6.0 - 8.3 g/dL   Albumin 3.7  3.5 - 5.2 g/dL   AST 19  0 - 37 U/L   ALT 15  0 - 35 U/L   Alkaline Phosphatase 64  39 - 117 U/L   Total Bilirubin 0.6  0.3 - 1.2 mg/dL   GFR calc non Af Amer 79 (*) >90 mL/min   GFR calc Af Amer >90  >90 mL/min  LIPASE, BLOOD      Result Value Range   Lipase 47  11 - 59 U/L  CG4 I-STAT (LACTIC ACID)      Result Value Range   Lactic Acid, Venous 1.92  0.5 - 2.2 mmol/L  POCT I-STAT TROPONIN I      Result Value Range   Troponin i, poc 0.00  0.00 - 0.08 ng/mL   Comment 3               Radiological Exams on Admission: Dg Abd Acute W/chest  10/20/2013   CLINICAL DATA:  Abdominal pain, history of uterine cancer  EXAM: ACUTE ABDOMEN SERIES (ABDOMEN 2 VIEW & CHEST 1 VIEW)  COMPARISON:  None.  FINDINGS: Cardiomediastinal silhouette is unremarkable. No acute infiltrate or pleural effusion. No pulmonary edema. Mild elevation of the right hemidiaphragm. Moderate stool in right colon and sigmoid colon. Distended small bowel loops with air-fluid levels in right abdomen highly suspicious for bowel obstruction. No free abdominal air.  IMPRESSION: No acute disease within chest. Distended small bowel loops in right abdomen with air-fluid levels highly suspicious for small bowel obstruction. No free abdominal air.   Electronically Signed   By: Lahoma Crocker M.D.   On: 10/20/2013 16:00      Orders placed during the hospital encounter of 10/20/13  . EKG 12-LEAD  . EKG 12-LEAD  . ED EKG  . ED EKG      Assessment/Plan Principal Problem:   SBO (small bowel obstruction)  SBO - presumed Adhesions.  Mild.  Sxatic Rx and NPO.  Surgical consultation ordered.  IV PPI, IV pain meds, IV Zofran for Sxs. Hold imodium until better and diarrhea returns due to short gut.  F/Up KUB in am.  May need CT Ab/Pelvis?  DVT Proph  Hypothyroid - on Synthroid - Hold while NPO  Osteoporosis - Evista and Vit D held.  HTN - Lisinopril held while NPO   Edy Belt M 10/20/2013, 4:56 PM

## 2013-10-20 NOTE — ED Notes (Signed)
Report called to 4W. Rn unavailable to take report at this time. Will call back.

## 2013-10-20 NOTE — ED Notes (Signed)
Pt. Made aware of need for UA. Pt. sts she will call out when she can go.

## 2013-10-21 ENCOUNTER — Inpatient Hospital Stay (HOSPITAL_COMMUNITY): Payer: Medicare HMO

## 2013-10-21 LAB — CBC
HEMATOCRIT: 37 % (ref 36.0–46.0)
Hemoglobin: 12.4 g/dL (ref 12.0–15.0)
MCH: 30.8 pg (ref 26.0–34.0)
MCHC: 33.5 g/dL (ref 30.0–36.0)
MCV: 91.8 fL (ref 78.0–100.0)
Platelets: 264 10*3/uL (ref 150–400)
RBC: 4.03 MIL/uL (ref 3.87–5.11)
RDW: 13.3 % (ref 11.5–15.5)
WBC: 8.5 10*3/uL (ref 4.0–10.5)

## 2013-10-21 LAB — BASIC METABOLIC PANEL
BUN: 11 mg/dL (ref 6–23)
CO2: 24 meq/L (ref 19–32)
Calcium: 8.2 mg/dL — ABNORMAL LOW (ref 8.4–10.5)
Chloride: 101 mEq/L (ref 96–112)
Creatinine, Ser: 0.6 mg/dL (ref 0.50–1.10)
GFR calc Af Amer: >90 mL/min (ref >90)
GFR, EST NON AFRICAN AMERICAN: 78 mL/min — AB (ref >90)
Glucose, Bld: 113 mg/dL — ABNORMAL HIGH (ref 70–99)
POTASSIUM: 3.9 meq/L (ref 3.7–5.3)
SODIUM: 134 meq/L — AB (ref 137–147)

## 2013-10-21 MED ORDER — LISINOPRIL 10 MG PO TABS
10.0000 mg | ORAL_TABLET | Freq: Every day | ORAL | Status: DC
  Administered 2013-10-21 – 2013-10-22 (×2): 10 mg via ORAL
  Filled 2013-10-21 (×4): qty 1

## 2013-10-21 MED ORDER — PANTOPRAZOLE SODIUM 40 MG PO TBEC
40.0000 mg | DELAYED_RELEASE_TABLET | Freq: Every day | ORAL | Status: DC
  Administered 2013-10-21 – 2013-10-22 (×2): 40 mg via ORAL
  Filled 2013-10-21 (×3): qty 1

## 2013-10-21 MED ORDER — LEVOTHYROXINE SODIUM 25 MCG PO TABS
25.0000 ug | ORAL_TABLET | Freq: Every day | ORAL | Status: DC
  Administered 2013-10-22 – 2013-10-23 (×2): 25 ug via ORAL
  Filled 2013-10-21 (×3): qty 1

## 2013-10-21 NOTE — Progress Notes (Signed)
Subjective: F/Up SBO in pt c H/O extensive Ab surgery in past and prior uterine CA/Surgery/XRT. She feels much better Passing flatus. No BMs. No N/V/AB pains.  Objective: Vital signs in last 24 hours: Temp:  [97.7 F (36.5 C)-98.2 F (36.8 C)] 97.9 F (36.6 C) 2022/11/15 0636) Pulse Rate:  [74-123] 74 11-15-2022 0636) Resp:  [18-22] 20 11/15/2022 0636) BP: (142-164)/(66-96) 142/66 mmHg 15-Nov-2022 0636) SpO2:  [92 %-98 %] 96 % 11-15-2022 0636) Weight:  [46.72 kg (103 lb)-49.2 kg (108 lb 7.5 oz)] 49.2 kg (108 lb 7.5 oz) 11-15-2022 0636) Weight change:     CBG (last 3)  No results found for this basename: GLUCAP,  in the last 72 hours  Intake/Output from previous day: No intake or output data in the 24 hours ending 11/15/2013 1101     Physical Exam  General appearance: Elderly but looks younger than stated age. Sitting in chair Head: Normocephalic, without obvious abnormality, atraumatic  Eyes: conjunctivae/corneas clear. PERRL, EOM's intact.  Nose: Nares normal. Septum midline. Mucosa normal. No drainage or sinus tenderness.  Throat: lips, mucosa, and tongue normal; teeth and gums normal  Neck: no adenopathy, no carotid bruit, no JVD and thyroid not enlarged, symmetric, no tenderness/mass/nodules  Resp: CTA  Cardio: Reg  GI: Ab scars noted. Soft, non-tender. No Reb/Guarding  Extremities: extremities normal, atraumatic, no cyanosis or edema  Pulses: 2+ and symmetric  Lymph nodes: no cervical lymphadenopathy  Neurologic: Alert and oriented X 3, normal strength and tone. Normal symmetric reflexes.      Lab Results:  Recent Labs  10/20/13 1610 November 15, 2013 0527  NA 133* 134*  K 3.9 3.9  CL 93* 101  CO2 27 24  GLUCOSE 132* 113*  BUN 13 11  CREATININE 0.58 0.60  CALCIUM 9.4 8.2*     Recent Labs  10/20/13 1610  AST 19  ALT 15  ALKPHOS 64  BILITOT 0.6  PROT 6.8  ALBUMIN 3.7     Recent Labs  10/20/13 1610 Nov 15, 2013 0527  WBC 9.8 8.5  NEUTROABS 7.3  --   HGB 15.0 12.4  HCT  42.7 37.0  MCV 89.1 91.8  PLT 316 264    No results found for this basename: INR, PROTIME    No results found for this basename: CKTOTAL, CKMB, CKMBINDEX, TROPONINI,  in the last 72 hours  No results found for this basename: TSH, T4TOTAL, FREET3, T3FREE, THYROIDAB,  in the last 72 hours  No results found for this basename: VITAMINB12, FOLATE, FERRITIN, TIBC, IRON, RETICCTPCT,  in the last 72 hours  Micro Results: No results found for this or any previous visit (from the past 240 hour(s)).   Studies/Results: Dg Abd 2 Views  15-Nov-2013   CLINICAL DATA:  Abdominal distention and partial small bowel obstruction.  EXAM: ABDOMEN - 2 VIEW  COMPARISON:  10/20/2013  FINDINGS: Some residual mildly prominent small bowel loops remain in the central and right lower abdomen with air-fluid levels present. Air and stool was present in the colon. No free air is identified.  IMPRESSION: Stable pattern of partial small bowel obstruction/small bowel ileus.   Electronically Signed   By: Aletta Edouard M.D.   On: 11/15/2013 10:22   Dg Abd Acute W/chest  10/20/2013   CLINICAL DATA:  Abdominal pain, history of uterine cancer  EXAM: ACUTE ABDOMEN SERIES (ABDOMEN 2 VIEW & CHEST 1 VIEW)  COMPARISON:  None.  FINDINGS: Cardiomediastinal silhouette is unremarkable. No acute infiltrate or pleural effusion. No pulmonary edema. Mild elevation of the  right hemidiaphragm. Moderate stool in right colon and sigmoid colon. Distended small bowel loops with air-fluid levels in right abdomen highly suspicious for bowel obstruction. No free abdominal air.  IMPRESSION: No acute disease within chest. Distended small bowel loops in right abdomen with air-fluid levels highly suspicious for small bowel obstruction. No free abdominal air.   Electronically Signed   By: Lahoma Crocker M.D.   On: 10/20/2013 16:00     Medications: Scheduled: . heparin  5,000 Units Subcutaneous Q8H  . pantoprazole (PROTONIX) IV  40 mg Intravenous Q24H    Continuous: . sodium chloride 1,000 mL (10/21/13 0655)     Assessment/Plan: Principal Problem:   SBO (small bowel obstruction)  SBO - presumed Adhesions. Mild. Sxatic Rx and NPO started.  As she is much better clinically - add clears and follow. Surgical consultation c Dr Zella Richer appreciated and his plan was: I agree with bowel rest. If this does not improve quickly with medical management, would recommend a CT scan with oral and IV contrast.  Continue IV PPI, IV pain meds, IV Zofran for Sxs. Hold imodium until better and diarrhea returns due to short gut.  F/Up KUB showed: Stable pattern of partial small bowel obstruction/small bowel ileus. May need CT Ab/Pelvis?   DVT Proph - SQ Hep Hypothyroid - restart Synthroid Osteoporosis - Evista and Vit D held.  HTN - restart Lisinopril. BP OK   LOS: 1 day   Cindy Brindisi M 10/21/2013, 11:01 AM

## 2013-10-21 NOTE — Progress Notes (Signed)
Patient ID: Teresa Gonzalez, female   DOB: 1922/03/17, 80 y.o.   MRN: [id number] [facility] Surgery Progress Note:   * No surgery found *  Subjective: Mental status is clear. No complaints.   Objective: Vital signs in last 24 hours: Temp:  [97.7 F (36.5 C)-98.2 F (36.8 C)] 97.9 F (36.6 C) (01/11 0636) Pulse Rate:  [74-123] 74 (01/11 0636) Resp:  [18-22] 20 (01/11 0636) BP: (142-164)/(66-96) 142/66 mmHg (01/11 0636) SpO2:  [92 %-98 %] 96 % (01/11 0636) Weight:  [103 lb (46.72 kg)-108 lb 7.5 oz (49.2 kg)] 108 lb 7.5 oz (49.2 kg) (01/11 0636)  Intake/Output from previous day:   Intake/Output this shift: Total I/O In: -  Out: 200 [Urine:200]  Physical Exam: Work of breathing is not labored.  No complaints of abdominal pain and has been started on liquids.    Lab Results:  Results for orders placed during the hospital encounter of 10/20/13 (from the past 48 hour(s))  CBC WITH DIFFERENTIAL     Status: None   Collection Time    10/20/13  4:10 PM      Result Value Range   WBC 9.8  4.0 - 10.5 K/uL   RBC 4.79  3.87 - 5.11 MIL/uL   Hemoglobin 15.0  12.0 - 15.0 g/dL   HCT 42.7  36.0 - 46.0 %   MCV 89.1  78.0 - 100.0 fL   MCH 31.3  26.0 - 34.0 pg   MCHC 35.1  30.0 - 36.0 g/dL   RDW 13.0  11.5 - 15.5 %   Platelets 316  150 - 400 K/uL   Neutrophils Relative % 74  43 - 77 %   Neutro Abs 7.3  1.7 - 7.7 K/uL   Lymphocytes Relative 17  12 - 46 %   Lymphs Abs 1.7  0.7 - 4.0 K/uL   Monocytes Relative 8  3 - 12 %   Monocytes Absolute 0.7  0.1 - 1.0 K/uL   Eosinophils Relative 1  0 - 5 %   Eosinophils Absolute 0.1  0.0 - 0.7 K/uL   Basophils Relative 0  0 - 1 %   Basophils Absolute 0.0  0.0 - 0.1 K/uL  COMPREHENSIVE METABOLIC PANEL     Status: Abnormal   Collection Time    10/20/13  4:10 PM      Result Value Range   Sodium 133 (*) 137 - 147 mEq/L   Potassium 3.9  3.7 - 5.3 mEq/L   Chloride 93 (*) 96 - 112 mEq/L   CO2 27  19 - 32 mEq/L   Glucose, Bld 132 (*) 70 - 99 mg/dL   BUN 13  6 - 23 mg/dL   Creatinine, Ser 0.58  0.50 - 1.10 mg/dL   Calcium 9.4  8.4 - 10.5 mg/dL   Total Protein 6.8  6.0 - 8.3 g/dL   Albumin 3.7  3.5 - 5.2 g/dL   AST 19  0 - 37 U/L   ALT 15  0 - 35 U/L   Alkaline Phosphatase 64  39 - 117 U/L   Total Bilirubin 0.6  0.3 - 1.2 mg/dL   GFR calc non Af Amer 79 (*) >90 mL/min   GFR calc Af Amer >90  >90 mL/min   Comment: (NOTE)     The eGFR has been calculated using the CKD EPI equation.     This calculation has not been validated in all clinical situations.     eGFR's persistently <90 mL/min signify  possible Chronic Kidney     Disease.  LIPASE, BLOOD     Status: None   Collection Time    10/20/13  4:10 PM      Result Value Range   Lipase 47  11 - 59 U/L  POCT I-STAT TROPONIN I     Status: None   Collection Time    10/20/13  4:17 PM      Result Value Range   Troponin i, poc 0.00  0.00 - 0.08 ng/mL   Comment 3            Comment: Due to the release kinetics of cTnI,     a negative result within the first hours     of the onset of symptoms does not rule out     myocardial infarction with certainty.     If myocardial infarction is still suspected,     repeat the test at appropriate intervals.  CG4 I-STAT (LACTIC ACID)     Status: None   Collection Time    10/20/13  4:19 PM      Result Value Range   Lactic Acid, Venous 1.92  0.5 - 2.2 mmol/L  URINALYSIS, ROUTINE W REFLEX MICROSCOPIC     Status: Abnormal   Collection Time    10/20/13  4:36 PM      Result Value Range   Color, Urine AMBER (*) YELLOW   Comment: BIOCHEMICALS MAY BE AFFECTED BY COLOR   APPearance CLOUDY (*) CLEAR   Specific Gravity, Urine 1.021  1.005 - 1.030   pH 6.0  5.0 - 8.0   Glucose, UA NEGATIVE  NEGATIVE mg/dL   Hgb urine dipstick NEGATIVE  NEGATIVE   Bilirubin Urine SMALL (*) NEGATIVE   Ketones, ur NEGATIVE  NEGATIVE mg/dL   Protein, ur NEGATIVE  NEGATIVE mg/dL   Urobilinogen, UA 0.2  0.0 - 1.0 mg/dL   Nitrite NEGATIVE  NEGATIVE   Leukocytes, UA SMALL  (*) NEGATIVE  URINE MICROSCOPIC-ADD ON     Status: Abnormal   Collection Time    10/20/13  4:36 PM      Result Value Range   WBC, UA 0-2  <3 WBC/hpf   Bacteria, UA FEW (*) RARE   Crystals CA OXALATE CRYSTALS (*) NEGATIVE  BASIC METABOLIC PANEL     Status: Abnormal   Collection Time    10/21/13  5:27 AM      Result Value Range   Sodium 134 (*) 137 - 147 mEq/L   Potassium 3.9  3.7 - 5.3 mEq/L   Chloride 101  96 - 112 mEq/L   Comment: DELTA CHECK NOTED     REPEATED TO VERIFY   CO2 24  19 - 32 mEq/L   Glucose, Bld 113 (*) 70 - 99 mg/dL   BUN 11  6 - 23 mg/dL   Creatinine, Ser 0.60  0.50 - 1.10 mg/dL   Calcium 8.2 (*) 8.4 - 10.5 mg/dL   GFR calc non Af Amer 78 (*) >90 mL/min   GFR calc Af Amer >90  >90 mL/min   Comment: (NOTE)     The eGFR has been calculated using the CKD EPI equation.     This calculation has not been validated in all clinical situations.     eGFR's persistently <90 mL/min signify possible Chronic Kidney     Disease.  CBC     Status: None   Collection Time    10/21/13  5:27 AM      Result Value  Range   WBC 8.5  4.0 - 10.5 K/uL   RBC 4.03  3.87 - 5.11 MIL/uL   Hemoglobin 12.4  12.0 - 15.0 g/dL   Comment: DELTA CHECK NOTED     REPEATED TO VERIFY   HCT 37.0  36.0 - 46.0 %   MCV 91.8  78.0 - 100.0 fL   MCH 30.8  26.0 - 34.0 pg   MCHC 33.5  30.0 - 36.0 g/dL   RDW 13.3  11.5 - 15.5 %   Platelets 264  150 - 400 K/uL    Radiology/Results: Dg Abd 2 Views  10/21/2013   CLINICAL DATA:  Abdominal distention and partial small bowel obstruction.  EXAM: ABDOMEN - 2 VIEW  COMPARISON:  10/20/2013  FINDINGS: Some residual mildly prominent small bowel loops remain in the central and right lower abdomen with air-fluid levels present. Air and stool was present in the colon. No free air is identified.  IMPRESSION: Stable pattern of partial small bowel obstruction/small bowel ileus.   Electronically Signed   By: Aletta Edouard M.D.   On: 10/21/2013 10:22   Dg Abd Acute  W/chest  10/20/2013   CLINICAL DATA:  Abdominal pain, history of uterine cancer  EXAM: ACUTE ABDOMEN SERIES (ABDOMEN 2 VIEW & CHEST 1 VIEW)  COMPARISON:  None.  FINDINGS: Cardiomediastinal silhouette is unremarkable. No acute infiltrate or pleural effusion. No pulmonary edema. Mild elevation of the right hemidiaphragm. Moderate stool in right colon and sigmoid colon. Distended small bowel loops with air-fluid levels in right abdomen highly suspicious for bowel obstruction. No free abdominal air.  IMPRESSION: No acute disease within chest. Distended small bowel loops in right abdomen with air-fluid levels highly suspicious for small bowel obstruction. No free abdominal air.   Electronically Signed   By: Lahoma Crocker M.D.   On: 10/20/2013 16:00    Anti-infectives: Anti-infectives   None      Assessment/Plan: Problem List: Patient Active Problem List   Diagnosis Date Noted  . SBO (small bowel obstruction) 10/20/2013  . Abnormality of gait 01/11/2012  . Disturbance of skin sensation 01/11/2012  . Degeneration of lumbar or lumbosacral intervertebral disc 01/11/2012  . Lumbosacral root lesions, not elsewhere classified 01/11/2012  . Polyneuropathy in other diseases classified elsewhere 01/11/2012    Would advance diet as tolerated.   * No surgery found *    LOS: 1 day   Matt B. Hassell Done, MD, [facility] [phone] beeper [phone]  10/21/2013 12:44 PM

## 2013-10-22 MED ORDER — ZOLPIDEM TARTRATE 5 MG PO TABS
2.5000 mg | ORAL_TABLET | Freq: Every evening | ORAL | Status: DC | PRN
  Administered 2013-10-22: 22:00:00 2.5 mg via ORAL
  Filled 2013-10-22: qty 1

## 2013-10-22 MED ORDER — LOPERAMIDE HCL 2 MG PO CAPS: 2.0000 mg | ORAL_CAPSULE | Freq: Three times a day (TID) | ORAL | Status: DC | PRN

## 2013-10-22 MED ORDER — TRAMADOL HCL 50 MG PO TABS: 50.0000 mg | ORAL_TABLET | Freq: Two times a day (BID) | ORAL | Status: DC | PRN

## 2013-10-22 NOTE — Progress Notes (Signed)
  Subjective: She is doing much better overall. No abdominal pain. Tolerating diet. Bowels are moving.  Objective: Vital signs in last 24 hours: Temp:  [97.5 F (36.4 C)-98.8 F (37.1 C)] 97.9 F (36.6 C) (01/12 0636) Pulse Rate:  [70-91] 78 (01/12 0636) Resp:  [16-20] 19 (01/12 0636) BP: (121-173)/(52-79) 172/79 mmHg (01/12 0636) SpO2:  [96 %-100 %] 98 % (01/12 0636) Weight:  [107 lb 5.8 oz (48.7 kg)] 107 lb 5.8 oz (48.7 kg) (01/12 0500) Last BM Date: 11-03-2013  Intake/Output from previous day: Nov 03, 2022 0701 - 01/12 0700 In: 1506.7 [P.O.:240; I.V.:1266.7] Out: 2400 [Urine:2400] Intake/Output this shift:    PE: General- In NAD Abdomen-soft, nontender, nondistended.  Lab Results:   Recent Labs  10/20/13 1610 11/03/13 0527  WBC 9.8 8.5  HGB 15.0 12.4  HCT 42.7 37.0  PLT 316 264   BMET  Recent Labs  10/20/13 1610 11/03/2013 0527  NA 133* 134*  K 3.9 3.9  CL 93* 101  CO2 27 24  GLUCOSE 132* 113*  BUN 13 11  CREATININE 0.58 0.60  CALCIUM 9.4 8.2*   PT/INR No results found for this basename: LABPROT, INR,  in the last 72 hours Comprehensive Metabolic Panel:    Component Value Date/Time   NA 134* Nov 03, 2013 0527   K 3.9 Nov 03, 2013 0527   CL 101 Nov 03, 2013 0527   CO2 24 03-Nov-2013 0527   BUN 11 2013/11/03 0527   CREATININE 0.60 11-03-2013 0527   GLUCOSE 113* November 03, 2013 0527   CALCIUM 8.2* 2013/11/03 0527   AST 19 10/20/2013 1610   ALT 15 10/20/2013 1610   ALKPHOS 64 10/20/2013 1610   BILITOT 0.6 10/20/2013 1610   PROT 6.8 10/20/2013 1610   ALBUMIN 3.7 10/20/2013 1610     Studies/Results: Dg Abd 2 Views  November 03, 2013   CLINICAL DATA:  Abdominal distention and partial small bowel obstruction.  EXAM: ABDOMEN - 2 VIEW  COMPARISON:  10/20/2013  FINDINGS: Some residual mildly prominent small bowel loops remain in the central and right lower abdomen with air-fluid levels present. Air and stool was present in the colon. No free air is identified.  IMPRESSION: Stable pattern  of partial small bowel obstruction/small bowel ileus.   Electronically Signed   By: Aletta Edouard M.D.   On: 03-Nov-2013 10:22   Dg Abd Acute W/chest  10/20/2013   CLINICAL DATA:  Abdominal pain, history of uterine cancer  EXAM: ACUTE ABDOMEN SERIES (ABDOMEN 2 VIEW & CHEST 1 VIEW)  COMPARISON:  None.  FINDINGS: Cardiomediastinal silhouette is unremarkable. No acute infiltrate or pleural effusion. No pulmonary edema. Mild elevation of the right hemidiaphragm. Moderate stool in right colon and sigmoid colon. Distended small bowel loops with air-fluid levels in right abdomen highly suspicious for bowel obstruction. No free abdominal air.  IMPRESSION: No acute disease within chest. Distended small bowel loops in right abdomen with air-fluid levels highly suspicious for small bowel obstruction. No free abdominal air.   Electronically Signed   By: Lahoma Crocker M.D.   On: 10/20/2013 16:00    Anti-infectives: Anti-infectives   None      Assessment Principal Problem:   SBO (small bowel obstruction)-Short-lived and resolving    LOS: 2 days   Plan: We'll be available if needed.   Hughey Rittenberry J 10/22/2013

## 2013-10-22 NOTE — Progress Notes (Signed)
Subjective: F/Up SBO in pt c H/O extensive Ab surgery in past and prior uterine CA/Surgery/XRT. She feels much better Passing hard small BMs c hemorrhoidal blood. Tolerated clears. Did not sleep well. No N/V/AB pains. Asking @ when to restart imodium.  Objective: Vital signs in last 24 hours: Temp:  [97.5 F (36.4 C)-98.8 F (37.1 C)] 97.9 F (36.6 C) (01/12 0636) Pulse Rate:  [70-91] 78 (01/12 0636) Resp:  [16-20] 19 (01/12 0636) BP: (121-173)/(52-79) 172/79 mmHg (01/12 0636) SpO2:  [96 %-100 %] 98 % (01/12 0636) Weight:  [48.7 kg (107 lb 5.8 oz)] 48.7 kg (107 lb 5.8 oz) (01/12 0500) Weight change: 1.979 kg (4 lb 5.8 oz) Last BM Date: 19-Nov-2013  CBG (last 3)  No results found for this basename: GLUCAP,  in the last 72 hours  Intake/Output from previous day:  Intake/Output Summary (Last 24 hours) at 10/22/13 0644 Last data filed at 10/22/13 0238  Gross per 24 hour  Intake    915 ml  Output   2400 ml  Net  -1485 ml   2022/11/19 0701 - 01/12 0700 In: 737 [P.O.:240; I.V.:675] Out: 2400 [Urine:2400]   Physical Exam  General appearance: Elderly but looks younger than stated age. Sitting in chair Head: Normocephalic, without obvious abnormality, atraumatic  Eyes: conjunctivae/corneas clear. PERRL, EOM's intact.  Nose: Nares normal. Septum midline. Mucosa normal. No drainage or sinus tenderness.  Throat: lips, mucosa, and tongue normal; teeth and gums normal  Neck: no adenopathy, no carotid bruit, no JVD and thyroid not enlarged, symmetric, no tenderness/mass/nodules  Resp: CTA  Cardio: Reg  GI: Ab scars noted. Soft, non-tender. No Reb/Guarding  Extremities: extremities normal, atraumatic, no cyanosis or edema  Pulses: 2+ and symmetric  Lymph nodes: no cervical lymphadenopathy  Neurologic: Alert and oriented X 3, normal strength and tone. Normal symmetric reflexes.      Lab Results:  Recent Labs  10/20/13 1610 11-19-2013 0527  NA 133* 134*  K 3.9 3.9  CL 93* 101   CO2 27 24  GLUCOSE 132* 113*  BUN 13 11  CREATININE 0.58 0.60  CALCIUM 9.4 8.2*     Recent Labs  10/20/13 1610  AST 19  ALT 15  ALKPHOS 64  BILITOT 0.6  PROT 6.8  ALBUMIN 3.7     Recent Labs  10/20/13 1610 2013-11-19 0527  WBC 9.8 8.5  NEUTROABS 7.3  --   HGB 15.0 12.4  HCT 42.7 37.0  MCV 89.1 91.8  PLT 316 264    No results found for this basename: INR,  PROTIME    No results found for this basename: CKTOTAL, CKMB, CKMBINDEX, TROPONINI,  in the last 72 hours  No results found for this basename: TSH, T4TOTAL, FREET3, T3FREE, THYROIDAB,  in the last 72 hours  No results found for this basename: VITAMINB12, FOLATE, FERRITIN, TIBC, IRON, RETICCTPCT,  in the last 72 hours  Micro Results: No results found for this or any previous visit (from the past 240 hour(s)).   Studies/Results: Dg Abd 2 Views  2013/11/19   CLINICAL DATA:  Abdominal distention and partial small bowel obstruction.  EXAM: ABDOMEN - 2 VIEW  COMPARISON:  10/20/2013  FINDINGS: Some residual mildly prominent small bowel loops remain in the central and right lower abdomen with air-fluid levels present. Air and stool was present in the colon. No free air is identified.  IMPRESSION: Stable pattern of partial small bowel obstruction/small bowel ileus.   Electronically Signed   By: Jenness Corner.D.  On: 10/21/2013 10:22   Dg Abd Acute W/chest  10/20/2013   CLINICAL DATA:  Abdominal pain, history of uterine cancer  EXAM: ACUTE ABDOMEN SERIES (ABDOMEN 2 VIEW & CHEST 1 VIEW)  COMPARISON:  None.  FINDINGS: Cardiomediastinal silhouette is unremarkable. No acute infiltrate or pleural effusion. No pulmonary edema. Mild elevation of the right hemidiaphragm. Moderate stool in right colon and sigmoid colon. Distended small bowel loops with air-fluid levels in right abdomen highly suspicious for bowel obstruction. No free abdominal air.  IMPRESSION: No acute disease within chest. Distended small bowel loops in right  abdomen with air-fluid levels highly suspicious for small bowel obstruction. No free abdominal air.   Electronically Signed   By: Lahoma Crocker M.D.   On: 10/20/2013 16:00     Medications: Scheduled: . heparin  5,000 Units Subcutaneous Q8H  . levothyroxine  25 mcg Oral QAC breakfast  . lisinopril  10 mg Oral Daily  . pantoprazole  40 mg Oral Daily   Continuous: . sodium chloride 50 mL/hr at 10/21/13 2137     Assessment/Plan: Principal Problem:   SBO (small bowel obstruction)  SBO - presumed Adhesions. Mild. Sxatic Rx and NPO initially.  As she got better we advanced diet to clears yesterday and reg diet today.  Restart Imodium soon after diarrhea resurfaces.  As she is much better clinically -  follow. Surgical consultation c Dr Zella Richer and Dr Hassell Done appreciated - Hold CT.  Continue PO PPI, PO but rare pain meds, PO or IV Zofran for Sxs. Hold imodium until better and diarrhea returns due to short gut.  F/Up KUB showed: Stable pattern of partial small bowel obstruction/small bowel ileus.    DVT Proph - SQ Hep Hypothyroid - Synthroid Osteoporosis - Evista and Vit D held and can be restarted on D/c  HTN -Lisinopril. BP OK to high. Give 8am meds now. Anticipate D/C tomorrow.   LOS: 2 days   Gareth Fitzner M 10/22/2013, 6:44 AM

## 2013-10-22 NOTE — Progress Notes (Signed)
INITIAL NUTRITION ASSESSMENT  DOCUMENTATION CODES Per approved criteria  -Underweight   INTERVENTION: - Encouraged PO intake - Offered alternative supplements for pt's GI tolerabiltiy - Monitor PO intake and supplement as warranted  NUTRITION DIAGNOSIS: Underweight related to inadequate oral intake as evidenced by BMI <18.5.   Goal: Pt to meet >/= 90% of their estimated nutrition needs    Monitor:  Total protein/energy intake, GI profile, labs, weights  Reason for Assessment: Underweight BMI  80 y.o. female  Admitting Dx: SBO (small bowel obstruction)  ASSESSMENT: 62 F s/p Hysterectomy for uterine CA followed c XRT. She had partial Small Bowel resection many yrs ago and has had chronic diarrhea since - due to short gut syndrome. She also has developed a peripheral neuropathy as well. She has been doing well in her usual state of health until yesterday when she was not quite right. Last night she started having Ab discomfort and pains. She had one small BM this am. She held her imodium. Tums was unhelpful. The pains are described as lower right abdominal pain and epigastric burning. The pains progressed and worsened and she called EMS. Pt was given 4 mg of Zofran and 50 meq of fentanyl in route by EMS. She denies fever, dysuria. No history of heart or lung problems. She has not done anything out of the ordinary. In ED labs were unremarkable. No Pancreatitis and AAS showed No acute disease within chest. Distended small bowel loops in right abdomen with air-fluid levels highly suspicious for small bowel obstruction. No free abdominal air. For the SBO she will be admitted for eval and Rx. She is currently well hydrated and HD stable. She is mostly comfortable with intermittent waves of discomfort.  - Pt denied any changes in weight prior to admission - Has had several days of poor appetite PTA d/t nausea/abd pain. MD noted pt with SBO-presumed adhesions. MD and surgery recommend bowel rest  for treatment - Diet advanced to regular, with improving appetite. Pt consuming approx 75% of meals. Bowels moving. - Pt w/hx of short gut syndrome and chronic diarrhea. Finds Ensure/Boost supplements "too heavy" on her stomach. Was willing to try Resource Breeze supplement if pt appetite does not return to baseline.    Height: Ht Readings from Last 1 Encounters:  10/20/13 5\' 4"  (1.626 m)    Weight: Wt Readings from Last 1 Encounters:  10/22/13 107 lb 5.8 oz (48.7 kg)    Ideal Body Weight: 120 lbs  % Ideal Body Weight: 90%  Wt Readings from Last 10 Encounters:  10/22/13 107 lb 5.8 oz (48.7 kg)  06/05/13 103 lb (46.72 kg)  01/11/12 106 lb (48.081 kg)  01/28/12 106 lb 6.4 oz (48.263 kg)    Usual Body Weight: 107 lbs  % Usual Body Weight: 100%  BMI:  Body mass index is 18.42 kg/(m^2). Underweight  Estimated Nutritional Needs: Kcal: 1300-1500 Protein: 50-60 gram Fluid: 1500 ml/daily  Skin: WDL  Diet Order: General  EDUCATION NEEDS: -No education needs identified at this time   Intake/Output Summary (Last 24 hours) at 10/22/13 1412 Last data filed at 10/22/13 1300  Gross per 24 hour  Intake 1986.67 ml  Output   2100 ml  Net -113.33 ml    Last BM: 1/11   Labs:   Recent Labs Lab 10/20/13 1610 10/21/13 0527  NA 133* 134*  K 3.9 3.9  CL 93* 101  CO2 27 24  BUN 13 11  CREATININE 0.58 0.60  CALCIUM 9.4 8.2*  GLUCOSE  132* 113*    CBG (last 3)  No results found for this basename: GLUCAP,  in the last 72 hours  Scheduled Meds: . heparin  5,000 Units Subcutaneous Q8H  . levothyroxine  25 mcg Oral QAC breakfast  . lisinopril  10 mg Oral Daily  . pantoprazole  40 mg Oral Daily    Continuous Infusions: . sodium chloride 30 mL/hr at 10/22/13 3646    Past Medical History  Diagnosis Date  . Peripheral neuropathy   . Hypothyroidism   . Depression   . Uterine cancer   . Status post radiation therapy   . S/P small bowel resection     with Diarrhea   . Left carpal tunnel syndrome     By nerve conduction study  . Vitamin D deficiency   . Depression   . Insomnia     Past Surgical History  Procedure Laterality Date  . Cataract extraction Bilateral   . Abdominal hysterectomy      Atlee Abide Pelzer LDN Clinical Dietitian [phone]

## 2013-10-23 MED ORDER — LOPERAMIDE HCL 2 MG PO CAPS: 2.0000 mg | ORAL_CAPSULE | Freq: Every day | ORAL | Status: DC | PRN

## 2013-10-23 NOTE — Discharge Instructions (Signed)
Ileus The intestine (bowel, or gut) is a long muscular tube connecting your stomach to your rectum. If the intestine stops working, food cannot pass through. This is called an ileus. This can happen for a variety of reasons. Ileus is a major medical problem that usually requires hospitalization. If your intestine stops working because of a blockage, this is called a bowel obstruction, and is a different condition. CAUSES   Surgery in your abdomen. This can last from a few hours to a few days.  An infection or inflammation in the belly (abdomen). This includes inflammation of the lining of the abdomen (peritonitis).  Infection or inflammation in other parts of the body, such as pneumonia or pancreatitis.  Passage of gallstones or kidney stones.  Damage to the nerves or blood vessels which go to the bowel.  Imbalance in the salts in the blood (electrolytes).  Injury to the brain and or spinal cord.  Medications. Many medications can cause ileus or make it worse. The most common of these are strong pain medications. SYMPTOMS  Symptoms of bowel obstruction come from the bowel inactivity. They may include:  Bloating. Your belly gets bigger (distension).  Pain or discomfort in the abdomen.  Poor appetite, feeling sick to your stomach (nausea) and vomiting.  You may also not be able to hear your normal bowel sounds, such as "growling" in your stomach. DIAGNOSIS   Your history and a physical exam will usually suggest to your caregiver that you have an ileus.  X-rays or a CT scan of your abdomen will confirm the diagnosis. X-rays, CT scans and lab tests may also suggest the cause. TREATMENT   Rest the intestine until it starts working again. This is most often accomplished by:  Stopping intake of oral food and drink. Dehydration is prevented by using IV (intravenous) fluids.  Sometimes, a naso-gastric tube (NG tube) is needed. This is a narrow plastic tube inserted through your nose  and into your stomach. It is connected to suction to keep the stomach emptied out. This also helps treat the nausea and vomiting.  If there is an imbalance in the electrolytes, they are corrected with supplements in your intravenous fluids.  Medications that might make an ileus worse might be stopped.  There are no medications that reliably treat ileus, though your caregiver may suggest a trial of certain medications.  If your condition is slow to resolve, you will be re-evaluated to be sure another condition, such as a blockage, is not present. Ileus is common and usually has a good outcome. Depending on cause of your ileus, it usually can be treated by your caregivers with good results. Sometimes, specialists (surgeons or gastroenterologists) are asked to assist in your care.  HOME CARE INSTRUCTIONS   Follow your caregiver's instructions regarding diet and fluid intake. This will usually include drinking plenty of clear fluids, avoiding alcohol and caffeine, and eating a gentle diet.  Follow your caregiver's instructions regarding activity. A period of rest is sometimes advised before returning to work or school.  Take only medications prescribed by your caregiver. Be especially careful with narcotic pain medication, which can slow your bowel activity and contribute to ileus.  Keep any follow-up appointments with your caregiver or specialists. SEEK MEDICAL CARE IF:   You have a recurrence of nausea, vomiting or abdominal discomfort.  You develop fever of more than 102 F (38.9 C). SEEK IMMEDIATE MEDICAL CARE IF:   You have severe abdominal pain.  You are unable to keep  of nausea, vomiting or abdominal discomfort.   You develop fever of more than 102 F (38.9 C).  SEEK IMMEDIATE MEDICAL CARE IF:    You have severe abdominal pain.   You are unable to keep fluids down.  Document Released: 09/30/2003 Document Revised: 12/20/2011 Document Reviewed: 01/30/2009  Exit[employer] Patient Information 2014 [employer].

## 2013-10-23 NOTE — Discharge Summary (Signed)
Physician Discharge Summary  DISCHARGE SUMMARY   Patient ID: Teresa Gonzalez MR#: [id number] DOB/AGE: 1922-04-04 80 y.o.   Attending 81 M  Patient's PJK:DTOIZ,TIWP M, MD  Consults:  gen surgery  Admit date: 10/20/2013 Discharge date: 10/23/2013  Discharge Diagnoses:  Principal Problem:   SBO (small bowel obstruction)   Patient Active Problem List   Diagnosis Date Noted  . SBO (small bowel obstruction) 10/20/2013  . Abnormality of gait 01/11/2012  . Disturbance of skin sensation 01/11/2012  . Degeneration of lumbar or lumbosacral intervertebral disc 01/11/2012  . Lumbosacral root lesions, not elsewhere classified 01/11/2012  . Polyneuropathy in other diseases classified elsewhere 01/11/2012   Past Medical History  Diagnosis Date  . Peripheral neuropathy   . Hypothyroidism   . Depression   . Uterine cancer   . Status post radiation therapy   . S/P small bowel resection     with Diarrhea  . Left carpal tunnel syndrome     By nerve conduction study  . Vitamin D deficiency   . Depression   . Insomnia     Discharged Condition: stable  Discharge Medications:   Medication List         ACID CONTROLLER COMPLETE [phone] MG Chew chewable tablet  Generic drug:  famotidine-calcium carbonate-magnesium hydroxide  Chew 1 tablet by mouth daily as needed.     aspirin 81 MG EC tablet  Take 81 mg by mouth daily. Swallow whole.     CALTRATE 600 PO  Take 600 mg by mouth daily.     levothyroxine 25 MCG tablet  Commonly known as:  SYNTHROID, LEVOTHROID  Take 25 mcg by mouth daily.     lisinopril 10 MG tablet  Commonly known as:  PRINIVIL,ZESTRIL  Take 10 mg by mouth daily.     loperamide 2 MG capsule  Commonly known as:  IMODIUM  Take 1 capsule (2 mg total) by mouth 5 (five) times daily as needed for diarrhea or loose stools.     multivitamin with minerals tablet  Take 1 tablet by mouth daily.     raloxifene 60 MG tablet  Commonly known as:   EVISTA  Take 60 mg by mouth daily.     Vitamin D (Ergocalciferol) 50000 UNITS Caps capsule  Commonly known as:  DRISDOL  Take 50,000 Units by mouth every 7 (seven) days.        Hospital Procedures: Dg Abd 2 Views  10/21/2013   CLINICAL DATA:  Abdominal distention and partial small bowel obstruction.  EXAM: ABDOMEN - 2 VIEW  COMPARISON:  10/20/2013  FINDINGS: Some residual mildly prominent small bowel loops remain in the central and right lower abdomen with air-fluid levels present. Air and stool was present in the colon. No free air is identified.  IMPRESSION: Stable pattern of partial small bowel obstruction/small bowel ileus.   Electronically Signed   By: Aletta Edouard M.D.   On: 10/21/2013 10:22   Dg Abd Acute W/chest  10/20/2013   CLINICAL DATA:  Abdominal pain, history of uterine cancer  EXAM: ACUTE ABDOMEN SERIES (ABDOMEN 2 VIEW & CHEST 1 VIEW)  COMPARISON:  None.  FINDINGS: Cardiomediastinal silhouette is unremarkable. No acute infiltrate or pleural effusion. No pulmonary edema. Mild elevation of the right hemidiaphragm. Moderate stool in right colon and sigmoid colon. Distended small bowel loops with air-fluid levels in right abdomen highly suspicious for bowel obstruction. No free abdominal air.  IMPRESSION: No acute disease within chest. Distended small bowel loops in right abdomen with air-fluid  levels highly suspicious for small bowel obstruction. No free abdominal air.   Electronically Signed   By: Lahoma Crocker M.D.   On: 10/20/2013 16:00    History of Present Illness: 38 F s/p Hysterectomy for uterine CA followed c XRT. She had partial Small Bowel resection many yrs ago and has had chronic diarrhea since - due to short gut syndrome. She also has developed a peripheral neuropathy as well. She normally takes several imodium per day to manage her chronic diarrhea. She had been doing well in her usual state of health until 1 day prior to admission when she was not quite right. On 1/9  night she started having Ab discomfort and pains. She had one small BM 1/10 am. She held her imodium. Tums was unhelpful. The pains are described as lower right abdominal pain and epigastric burning. The pains progressed and worsened and she called EMS. Pt was given 4 mg of Zofran and 50 meq of fentanyl in route by EMS. She denied fever, dysuria. No history of heart or lung problems. She has not done anything out of the ordinary. In ED labs were unremarkable. No Pancreatitis and AAS showed No acute disease within chest. Distended small bowel loops in right abdomen with air-fluid levels highly suspicious for small bowel obstruction. No free abdominal air. For the SBO she was  admitted for eval and Rx. She was well hydrated and HD stable. She was mostly comfortable with intermittent waves of discomfort. NGT was not needed.      Hospital Course: She was admitted 10/20/13 for SBO in pt c H/O extensive Ab surgery in past and prior uterine CA/Surgery/XRT.  She was made NPO and Imodium held. She was treated conservatively. Gen Surg was consulted. She did not need NGT. We held Ab CT as well. By 1/12 she was Passing hard small BMs c hemorrhoidal blood and tolerated clears. Reg diet started No further N/V/AB pains.   SBO - presumed Adhesions. Mild. Sxatic Rx and NPO initially. As she got better we advanced diet to clears and reg. OK to restart Imodium soon after diarrhea resurfaces and slowly advance back to the way she normally does this medicine. She is much better clinically. Surgical consultation c Dr Zella Richer and Dr Hassell Done appreciated - Hold CT. She was on PPI for the epigastric burning but it is no longer needed.  She stopped needing pain meds. F/Up KUB showed: Stable pattern of partial small bowel obstruction/small bowel ileus.  DVT Proph - SQ Hep  Hypothyroid - Synthroid  Osteoporosis - Evista and Vit D held and can be restarted on D/c  HTN -Lisinopril. BP OK .  Underweight c BMI < 18.5 - she has  been this weight for > 14 yrs and she is healthy.  Monitor.  Seen on am rounds and she is near baseline.  Getting a bath.  Doing well.  Eating.  No pain.  Ready for D/C.  Slept last night.   Day of Discharge Exam BP 132/65  Pulse 79  Temp(Src) 98.7 F (37.1 C) (Oral)  Resp 14  Ht 5\' 4"  (1.626 m)  Wt 48.444 kg (106 lb 12.8 oz)  BMI 18.32 kg/m2  SpO2 100%  Physical Exam: General appearance: Elderly but looks younger than stated age.  Resp: CTA  Cardio: Reg  GI: Ab scars noted. Soft, non-tender. No Reb/Guarding  Extremities: extremities normal, atraumatic, no cyanosis or edema  Pulses: 2+ and symmetric  Lymph nodes: no cervical lymphadenopathy  Neurologic: Alert and oriented  X 3, normal strength and tone. Normal symmetric reflexes.     Discharge Labs:  Recent Labs  10/20/13 1610 10/21/13 0527  NA 133* 134*  K 3.9 3.9  CL 93* 101  CO2 27 24  GLUCOSE 132* 113*  BUN 13 11  CREATININE 0.58 0.60  CALCIUM 9.4 8.2*    Recent Labs  10/20/13 1610  AST 19  ALT 15  ALKPHOS 64  BILITOT 0.6  PROT 6.8  ALBUMIN 3.7    Recent Labs  10/20/13 1610 10/21/13 0527  WBC 9.8 8.5  NEUTROABS 7.3  --   HGB 15.0 12.4  HCT 42.7 37.0  MCV 89.1 91.8  PLT 316 264   No results found for this basename: CKTOTAL, CKMB, CKMBINDEX, TROPONINI,  in the last 72 hours No results found for this basename: TSH, T4TOTAL, FREET3, T3FREE, THYROIDAB,  in the last 72 hours No results found for this basename: VITAMINB12, FOLATE, FERRITIN, TIBC, IRON, RETICCTPCT,  in the last 72 hours No results found for this basename: INR, PROTIME       Discharge instructions:      Future Appointments Provider Department Dept Phone   06/04/2014 2:30 PM Kathrynn Ducking, MD [facility] [phone]     Final discharge disposition not confirmed Follow-up Information   Follow up with [name], MD In 1 week.   Specialty:  Internal Medicine   Contact information:   [facility] [place] [address] [phone]        Disposition: home Follow-up Appts: Follow-up with Dr. Virgina Jock at [facility] in 1 week.  Call for appointment.  Condition on Discharge: stable  Tests Needing Follow-up: None.  Time spent in discharge (includes decision making & examination of pt):25 min  Signed: Calven Gilkes M 10/23/2013, 6:45 AM

## 2014-01-26 ENCOUNTER — Emergency Department (HOSPITAL_COMMUNITY): Payer: Medicare HMO

## 2014-01-26 ENCOUNTER — Encounter (HOSPITAL_COMMUNITY): Payer: Self-pay | Admitting: Emergency Medicine

## 2014-01-26 ENCOUNTER — Emergency Department (HOSPITAL_COMMUNITY)
Admission: EM | Admit: 2014-01-26 | Discharge: 2014-01-26 | Disposition: A | Discharge status: Home / Self Care | Payer: Medicare HMO | Attending: Emergency Medicine | Admitting: Emergency Medicine

## 2014-01-26 DIAGNOSIS — S61409A Unspecified open wound of unspecified hand, initial encounter: Secondary | ICD-10-CM | POA: Insufficient documentation

## 2014-01-26 DIAGNOSIS — E039 Hypothyroidism, unspecified: Secondary | ICD-10-CM | POA: Insufficient documentation

## 2014-01-26 DIAGNOSIS — Y939 Activity, unspecified: Secondary | ICD-10-CM | POA: Insufficient documentation

## 2014-01-26 DIAGNOSIS — Z8542 Personal history of malignant neoplasm of other parts of uterus: Secondary | ICD-10-CM | POA: Insufficient documentation

## 2014-01-26 DIAGNOSIS — W230XXA Caught, crushed, jammed, or pinched between moving objects, initial encounter: Secondary | ICD-10-CM | POA: Insufficient documentation

## 2014-01-26 DIAGNOSIS — S60229A Contusion of unspecified hand, initial encounter: Secondary | ICD-10-CM | POA: Insufficient documentation

## 2014-01-26 DIAGNOSIS — E559 Vitamin D deficiency, unspecified: Secondary | ICD-10-CM | POA: Insufficient documentation

## 2014-01-26 DIAGNOSIS — Z79899 Other long term (current) drug therapy: Secondary | ICD-10-CM | POA: Insufficient documentation

## 2014-01-26 DIAGNOSIS — IMO0002 Reserved for concepts with insufficient information to code with codable children: Secondary | ICD-10-CM

## 2014-01-26 DIAGNOSIS — Z23 Encounter for immunization: Secondary | ICD-10-CM | POA: Insufficient documentation

## 2014-01-26 DIAGNOSIS — Z880 Allergy status to penicillin: Secondary | ICD-10-CM | POA: Insufficient documentation

## 2014-01-26 DIAGNOSIS — Z8659 Personal history of other mental and behavioral disorders: Secondary | ICD-10-CM | POA: Insufficient documentation

## 2014-01-26 DIAGNOSIS — Z7982 Long term (current) use of aspirin: Secondary | ICD-10-CM | POA: Insufficient documentation

## 2014-01-26 DIAGNOSIS — Y929 Unspecified place or not applicable: Secondary | ICD-10-CM | POA: Insufficient documentation

## 2014-01-26 DIAGNOSIS — Z8669 Personal history of other diseases of the nervous system and sense organs: Secondary | ICD-10-CM | POA: Insufficient documentation

## 2014-01-26 DIAGNOSIS — S60222A Contusion of left hand, initial encounter: Secondary | ICD-10-CM

## 2014-01-26 MED ORDER — TETANUS-DIPHTH-ACELL PERTUSSIS 5-2.5-18.5 LF-MCG/0.5 IM SUSP
0.5000 mL | Freq: Once | INTRAMUSCULAR | Status: AC
  Administered 2014-01-26: 0.5 mL via INTRAMUSCULAR
  Filled 2014-01-26: qty 0.5

## 2014-01-26 NOTE — ED Notes (Signed)
Pt states she accidentally shut the car door on her lt hand.  Large skin tear and bruising noted to hand.

## 2014-01-26 NOTE — ED Provider Notes (Signed)
Medical screening examination/treatment/procedure(s) were conducted as a shared visit with non-physician practitioner(s) and myself.  I personally evaluated the patient during the encounter.   EKG Interpretation None        Mervin Kung, MD 01/26/14 [phone]

## 2014-01-26 NOTE — Discharge Instructions (Signed)
1. Medications: usual home medications 2. Treatment: rest, drink plenty of fluids,  3. Follow Up: Please followup with your primary doctor in 3-5 days for wound check.     Sterile Tape Wound Care Some cuts and wounds can be closed using sterile tape, also called skin adhesive strips. Skin adhesive strips can be used for shallow (superficial) and simple cuts, wounds, lacerations, and surgical incisions. These strips act in place of stitches to hold the edges of the wound together, allowing for faster healing. Unlike stitches, the adhesive strips do not require needles or anesthetic medicine for placement. The strips will wear off naturally as the wound is healing. It is important to take proper care of your wound at home while it heals.  HOME CARE INSTRUCTIONS  Try to keep the area around your wound clean and dry. Do not allow the adhesive strips to get wet for the first 12 hours.   Do not use any soaps or ointments on the wound for the first 12 hours.   If a bandage (dressing) has been applied, follow your health care provider's instructions for how often to change the dressing. Keep the dressing dry if one has been applied.   Do not remove the adhesive strips. They will fall off on their own. If they do not, you may remove them gently after 10 days. You should gently wet the strips before removing them. For example, this can be done in the shower.  Do not scratch, pick, or rub the wound area.   Protect the wound from further injury until it is healed.   Protect the wound from sun and tanning bed exposure while it is healing and for several weeks after healing.   Only take over-the-counter or prescription medicines as directed by your health care provider.   Keep all follow-up appointments as directed by your health care provider.  SEEK MEDICAL CARE IF: Your adhesive strips become wet or soaked with blood before the wound has healed. The tape will need to be replaced.  SEEK  IMMEDIATE MEDICAL CARE IF:  You have increasing pain in the wound.   You develop a rash after the strips are applied.  Your wound becomes red, swollen, hot, or tender.   You have a red streak that goes away from the wound.   You have pus coming from the wound.   You have increased bleeding from the wound.  You notice a bad smell coming from the wound.   Your wound breaks open. MAKE SURE YOU:  Understand these instructions.  Will watch your condition.  Will get help right away if you are not doing well or get worse. Document Released: 11/04/2004 Document Revised: 07/18/2013 Document Reviewed: 04/18/2013 [facility] Patient Information 2014 [place], [place].

## 2014-01-26 NOTE — ED Provider Notes (Signed)
CSN: [id number]     Arrival date & time 01/26/14  1533 History   First MD Initiated Contact with Patient 01/26/14 1645     Chief Complaint  Patient presents with  . Hand Injury     (Consider location/radiation/quality/duration/timing/severity/associated sxs/prior Treatment) Patient is a 80 y.o. female presenting with hand injury.  Hand Injury Associated symptoms: no back pain, no fever and no neck pain     Teresa Gonzalez is a 80 y.o. female  with a hx of peripheral neuropathy, depression, hypothryoid  presents to the Emergency Department complaining of acute pain and laceration to the left hand onset approx 3pm after her car door closed on her left hand.  Pt reports she has been able to move all her fingers and wrist without difficulty.  She does not remember the last tetanus shot she had.  Associated symptoms include bleeding at the site.  She reports using ice and a washcloth at home without resolution of the pain. Hemostasis achieved.  Ice improves the pain and palpation makes the pain worse.  Pt denies use of anticoagulants.  Pt denies fever, chills, headache, neck pain, left shoulder pain, weakness, numbness, loss of sensation.     Past Medical History  Diagnosis Date  . Peripheral neuropathy   . Hypothyroidism   . Depression   . Uterine cancer   . Status post radiation therapy   . S/P small bowel resection     with Diarrhea  . Left carpal tunnel syndrome     By nerve conduction study  . Vitamin D deficiency   . Depression   . Insomnia    Past Surgical History  Procedure Laterality Date  . Cataract extraction Bilateral   . Abdominal hysterectomy     Family History  Problem Relation Age of Onset  . Stroke Mother   . Aneurysm Father   . Diabetes Brother    History  Substance Use Topics  . Smoking status: Never Smoker   . Smokeless tobacco: Never Used  . Alcohol Use: No   OB History   Grav Para Term Preterm Abortions TAB SAB Ect Mult Living                  Review of Systems  Constitutional: Negative for fever and chills.  Gastrointestinal: Negative for nausea and vomiting.  Musculoskeletal: Positive for arthralgias (left hand). Negative for back pain, joint swelling, neck pain and neck stiffness.  Skin: Positive for wound.  Allergic/Immunologic: Negative for immunocompromised state.  Neurological: Negative for weakness and numbness.  Hematological: Does not bruise/bleed easily.  Psychiatric/Behavioral: The patient is not nervous/anxious.   All other systems reviewed and are negative.     Allergies  Codeine and Penicillins  Home Medications   Prior to Admission medications   Medication Sig Start Date End Date Taking? Authorizing Provider  aspirin 81 MG EC tablet Take 81 mg by mouth daily. Swallow whole.    Historical Provider, MD  Calcium Carbonate (CALTRATE 600 PO) Take 600 mg by mouth daily.     Historical Provider, MD  famotidine-calcium carbonate-magnesium hydroxide (ACID CONTROLLER COMPLETE) [phone] MG CHEW chewable tablet Chew 1 tablet by mouth daily as needed.    Historical Provider, MD  levothyroxine (SYNTHROID, LEVOTHROID) 25 MCG tablet Take 25 mcg by mouth daily.    Historical Provider, MD  lisinopril (PRINIVIL,ZESTRIL) 10 MG tablet Take 10 mg by mouth daily.    Historical Provider, MD  loperamide (IMODIUM) 2 MG capsule Take 1 capsule (2 mg  total) by mouth 5 (five) times daily as needed for diarrhea or loose stools. 10/23/13   [name], MD  Multiple Vitamins-Minerals (MULTIVITAMIN WITH MINERALS) tablet Take 1 tablet by mouth daily.    Historical Provider, MD  raloxifene (EVISTA) 60 MG tablet Take 60 mg by mouth daily.    Historical Provider, MD  Vitamin D, Ergocalciferol, (DRISDOL) 50000 UNITS CAPS Take 50,000 Units by mouth every 7 (seven) days.     Historical Provider, MD   BP 138/68  Pulse 97  Temp(Src) 99.5 F (37.5 C) (Oral)  Resp 18  SpO2 100% Physical Exam  Nursing note and vitals  reviewed. Constitutional: She is oriented to person, place, and time. She appears well-developed and well-nourished. No distress.  HENT:  Head: Normocephalic and atraumatic.  Eyes: Conjunctivae are normal. No scleral icterus.  Neck: Normal range of motion.  Cardiovascular: Normal rate, regular rhythm, normal heart sounds and intact distal pulses.   No murmur heard. Capillary refill < 3 sec Distal pulses intact  Pulmonary/Chest: Effort normal and breath sounds normal. No respiratory distress. She has no wheezes.  Musculoskeletal: Normal range of motion. She exhibits no edema.  ROM: Full ROM of the left wrist and fingers  Neurological: She is alert and oriented to person, place, and time. She exhibits normal muscle tone. Coordination normal.  Sensation: intact to dull and sharp including 2 point discrimination of the left hand Strength: 5/5 of the LUE including resisted flexion and extension of the fingers and wrist.  Strong and equal grip strength  Skin: Skin is warm and dry. She is not diaphoretic. No erythema.  2cm and 3.5cm lacerations to the posterior portion of the L hand with surrounding ecchymosis and contusion  Psychiatric: She has a normal mood and affect.    ED Course  LACERATION REPAIR Date/Time: 01/26/2014 5:08 PM Performed by: Abigail Butts Authorized by: Abigail Butts Consent: Verbal consent obtained. Risks and benefits: risks, benefits and alternatives were discussed Consent given by: patient Patient understanding: patient states understanding of the procedure being performed Patient consent: the patient's understanding of the procedure matches consent given Procedure consent: procedure consent matches procedure scheduled Relevant documents: relevant documents present and verified Site marked: the operative site was marked Imaging studies: imaging studies available Required items: required blood products, implants, devices, and special equipment  available Patient identity confirmed: verbally with patient and arm band Time out: Immediately prior to procedure a "time out" was called to verify the correct patient, procedure, equipment, support staff and site/side marked as required. Body area: upper extremity Location details: left hand Laceration length: 3.5 cm Foreign bodies: no foreign bodies Tendon involvement: none Nerve involvement: none Vascular damage: no Patient sedated: no Preparation: Patient was prepped and draped in the usual sterile fashion. Irrigation solution: saline Irrigation method: syringe Amount of cleaning: standard Skin closure: Steri-Strips Approximation: loose Approximation difficulty: complex Patient tolerance: Patient tolerated the procedure well with no immediate complications.  LACERATION REPAIR Date/Time: 01/26/2014 5:09 PM Performed by: Abigail Butts Authorized by: Abigail Butts Consent: Verbal consent obtained. Risks and benefits: risks, benefits and alternatives were discussed Consent given by: patient Patient understanding: patient states understanding of the procedure being performed Patient consent: the patient's understanding of the procedure matches consent given Procedure consent: procedure consent matches procedure scheduled Relevant documents: relevant documents present and verified Site marked: the operative site was marked Imaging studies: imaging studies available Required items: required blood products, implants, devices, and special equipment available Patient identity confirmed: verbally with patient and  arm band Time out: Immediately prior to procedure a "time out" was called to verify the correct patient, procedure, equipment, support staff and site/side marked as required. Body area: upper extremity Location details: left hand Laceration length: 1.5 cm Foreign bodies: no foreign bodies Tendon involvement: none Nerve involvement: none Vascular damage:  no Patient sedated: no Preparation: Patient was prepped and draped in the usual sterile fashion. Irrigation solution: saline Irrigation method: syringe Amount of cleaning: standard Skin closure: Steri-Strips Approximation: loose Approximation difficulty: complex Patient tolerance: Patient tolerated the procedure well with no immediate complications.   (including critical care time) Labs Review Labs Reviewed - No data to display  Imaging Review Dg Hand Complete Left  01/26/2014   CLINICAL DATA:  Trauma and pain. Lacerations posterior aspect between the first and third metacarpals.  EXAM: LEFT HAND - COMPLETE 3+ VIEW  COMPARISON:  None.  FINDINGS: Osteopenia. Scattered degenerative changes. Mild dorsal soft tissue swelling at the level of the distal metacarpals. No acute fracture or dislocation.  IMPRESSION: No acute osseous abnormality.   Electronically Signed   By: Abigail Miyamoto M.D.   On: 01/26/2014 16:23     EKG Interpretation None      MDM   Final diagnoses:  Skin tear  Contusion of left hand   Teresa Gonzalez presents with pain in the L hand and multiple skin tears to the dorsum of the L hand after her car door shut on in several hours ago.  Pt without lacerations which are suturable.  Wounds cleaned, skin approximated as closely as possible and wound closed with steri-strips.    X-ray reviewed and is without fracture or dislocation.  I personally reviewed the imaging tests through PACS system. I reviewed available ER/hospitalization records through the EMR.    Tdap booster given. Pressure irrigation performed. Laceration occurred < 8 hours prior to repair which was well tolerated. Pt has no co morbidities to effect normal wound healing. Discussed wound home care w pt and answered questions. Pt to f-u for wound check in 3-5 days with her PCP. Pt is hemodynamically stable w no complaints prior to dc.     It has been determined that no acute conditions requiring further emergency  intervention are present at this time. The patient/guardian have been advised of the diagnosis and plan. We have discussed signs and symptoms that warrant return to the ED, such as changes or worsening in symptoms.   Vital signs are stable at discharge.   BP 138/68  Pulse 97  Temp(Src) 99.5 F (37.5 C) (Oral)  Resp 18  SpO2 100%  Patient/guardian has voiced understanding and agreed to follow-up with the PCP or specialist.  The patient was discussed with and seen by Dr. Rogene Houston who agrees with the treatment plan.      MDM Number of Diagnoses or Management Options Contusion of left hand: new, needed workup Skin tear: new, needed workup   Amount and/or Complexity of Data Reviewed Tests in the radiology section of CPT: ordered and reviewed Obtain history from someone other than the patient: yes Review and summarize past medical records: yes Discuss the patient with other providers: yes Independent visualization of images, tracings, or specimens: yes  Patient Progress Patient progress: improved       Abigail Butts, PA-C 01/26/14 1729

## 2014-01-26 NOTE — ED Provider Notes (Signed)
Medical screening examination/treatment/procedure(s) were conducted as a shared visit with non-physician practitioner(s) and myself.  I personally evaluated the patient during the encounter.   EKG Interpretation None       Patient seen by me. 80 year old female with injury to her left hand. She got it caught in a car door that closes automatically. Did not injure her fingers. Skin tear to the back of the hand. X-rays negative for any fracture. Wound care instructions on over with patient and she understands what to do. Her tetanus was updated today.  Results for orders placed during the hospital encounter of 10/20/13  CBC WITH DIFFERENTIAL      Result Value Ref Range   WBC 9.8  4.0 - 10.5 K/uL   RBC 4.79  3.87 - 5.11 MIL/uL   Hemoglobin 15.0  12.0 - 15.0 g/dL   HCT 42.7  36.0 - 46.0 %   MCV 89.1  78.0 - 100.0 fL   MCH 31.3  26.0 - 34.0 pg   MCHC 35.1  30.0 - 36.0 g/dL   RDW 13.0  11.5 - 15.5 %   Platelets 316  150 - 400 K/uL   Neutrophils Relative % 74  43 - 77 %   Neutro Abs 7.3  1.7 - 7.7 K/uL   Lymphocytes Relative 17  12 - 46 %   Lymphs Abs 1.7  0.7 - 4.0 K/uL   Monocytes Relative 8  3 - 12 %   Monocytes Absolute 0.7  0.1 - 1.0 K/uL   Eosinophils Relative 1  0 - 5 %   Eosinophils Absolute 0.1  0.0 - 0.7 K/uL   Basophils Relative 0  0 - 1 %   Basophils Absolute 0.0  0.0 - 0.1 K/uL  COMPREHENSIVE METABOLIC PANEL      Result Value Ref Range   Sodium 133 (*) 137 - 147 mEq/L   Potassium 3.9  3.7 - 5.3 mEq/L   Chloride 93 (*) 96 - 112 mEq/L   CO2 27  19 - 32 mEq/L   Glucose, Bld 132 (*) 70 - 99 mg/dL   BUN 13  6 - 23 mg/dL   Creatinine, Ser 0.58  0.50 - 1.10 mg/dL   Calcium 9.4  8.4 - 10.5 mg/dL   Total Protein 6.8  6.0 - 8.3 g/dL   Albumin 3.7  3.5 - 5.2 g/dL   AST 19  0 - 37 U/L   ALT 15  0 - 35 U/L   Alkaline Phosphatase 64  39 - 117 U/L   Total Bilirubin 0.6  0.3 - 1.2 mg/dL   GFR calc non Af Amer 79 (*) >90 mL/min   GFR calc Af Amer >90  >90 mL/min  LIPASE, BLOOD       Result Value Ref Range   Lipase 47  11 - 59 U/L  URINALYSIS, ROUTINE W REFLEX MICROSCOPIC      Result Value Ref Range   Color, Urine AMBER (*) YELLOW   APPearance CLOUDY (*) CLEAR   Specific Gravity, Urine 1.021  1.005 - 1.030   pH 6.0  5.0 - 8.0   Glucose, UA NEGATIVE  NEGATIVE mg/dL   Hgb urine dipstick NEGATIVE  NEGATIVE   Bilirubin Urine SMALL (*) NEGATIVE   Ketones, ur NEGATIVE  NEGATIVE mg/dL   Protein, ur NEGATIVE  NEGATIVE mg/dL   Urobilinogen, UA 0.2  0.0 - 1.0 mg/dL   Nitrite NEGATIVE  NEGATIVE   Leukocytes, UA SMALL (*) NEGATIVE  URINE MICROSCOPIC-ADD ON  Result Value Ref Range   WBC, UA 0-2  <3 WBC/hpf   Bacteria, UA FEW (*) RARE   Crystals CA OXALATE CRYSTALS (*) NEGATIVE  BASIC METABOLIC PANEL      Result Value Ref Range   Sodium 134 (*) 137 - 147 mEq/L   Potassium 3.9  3.7 - 5.3 mEq/L   Chloride 101  96 - 112 mEq/L   CO2 24  19 - 32 mEq/L   Glucose, Bld 113 (*) 70 - 99 mg/dL   BUN 11  6 - 23 mg/dL   Creatinine, Ser 0.60  0.50 - 1.10 mg/dL   Calcium 8.2 (*) 8.4 - 10.5 mg/dL   GFR calc non Af Amer 78 (*) >90 mL/min   GFR calc Af Amer >90  >90 mL/min  CBC      Result Value Ref Range   WBC 8.5  4.0 - 10.5 K/uL   RBC 4.03  3.87 - 5.11 MIL/uL   Hemoglobin 12.4  12.0 - 15.0 g/dL   HCT 37.0  36.0 - 46.0 %   MCV 91.8  78.0 - 100.0 fL   MCH 30.8  26.0 - 34.0 pg   MCHC 33.5  30.0 - 36.0 g/dL   RDW 13.3  11.5 - 15.5 %   Platelets 264  150 - 400 K/uL  CG4 I-STAT (LACTIC ACID)      Result Value Ref Range   Lactic Acid, Venous 1.92  0.5 - 2.2 mmol/L  POCT I-STAT TROPONIN I      Result Value Ref Range   Troponin i, poc 0.00  0.00 - 0.08 ng/mL   Comment 3            Dg Hand Complete Left  01/26/2014   CLINICAL DATA:  Trauma and pain. Lacerations posterior aspect between the first and third metacarpals.  EXAM: LEFT HAND - COMPLETE 3+ VIEW  COMPARISON:  None.  FINDINGS: Osteopenia. Scattered degenerative changes. Mild dorsal soft tissue swelling at the level  of the distal metacarpals. No acute fracture or dislocation.  IMPRESSION: No acute osseous abnormality.   Electronically Signed   By: Abigail Miyamoto M.D.   On: 01/26/2014 16:23      Mervin Kung, MD 01/26/14 [phone]

## 2014-06-04 ENCOUNTER — Ambulatory Visit: Payer: Medicare HMO | Admitting: Neurology

## 2014-08-22 ENCOUNTER — Other Ambulatory Visit (HOSPITAL_COMMUNITY): Payer: Self-pay | Admitting: Internal Medicine

## 2014-08-22 DIAGNOSIS — R1314 Dysphagia, pharyngoesophageal phase: Secondary | ICD-10-CM

## 2014-08-28 ENCOUNTER — Encounter: Payer: Self-pay | Admitting: Neurology

## 2014-08-29 ENCOUNTER — Ambulatory Visit (HOSPITAL_COMMUNITY)
Admission: RE | Admit: 2014-08-29 | Discharge: 2014-08-29 | Disposition: A | Discharge status: Home / Self Care | Payer: Commercial Managed Care - HMO | Source: Ambulatory Visit | Attending: Internal Medicine | Admitting: Internal Medicine

## 2014-08-29 DIAGNOSIS — F329 Major depressive disorder, single episode, unspecified: Secondary | ICD-10-CM | POA: Diagnosis not present

## 2014-08-29 DIAGNOSIS — G629 Polyneuropathy, unspecified: Secondary | ICD-10-CM | POA: Insufficient documentation

## 2014-08-29 DIAGNOSIS — Z923 Personal history of irradiation: Secondary | ICD-10-CM | POA: Diagnosis not present

## 2014-08-29 DIAGNOSIS — E559 Vitamin D deficiency, unspecified: Secondary | ICD-10-CM | POA: Insufficient documentation

## 2014-08-29 DIAGNOSIS — E039 Hypothyroidism, unspecified: Secondary | ICD-10-CM | POA: Insufficient documentation

## 2014-08-29 DIAGNOSIS — G4700 Insomnia, unspecified: Secondary | ICD-10-CM | POA: Diagnosis not present

## 2014-08-29 DIAGNOSIS — R1310 Dysphagia, unspecified: Secondary | ICD-10-CM | POA: Diagnosis present

## 2014-08-29 DIAGNOSIS — R1314 Dysphagia, pharyngoesophageal phase: Secondary | ICD-10-CM

## 2014-08-29 NOTE — Procedures (Signed)
Objective Swallowing Evaluation: Modified Barium Swallowing Study  Patient Details  Name: Teresa Gonzalez MRN: [id number] Date of Birth: 1922/03/09  Today's Date: 08/29/2014 Time: 2376-2831 SLP Time Calculation (min) (ACUTE ONLY): 29 min  Past Medical History:  Past Medical History  Diagnosis Date  . Peripheral neuropathy   . Hypothyroidism   . Depression   . Uterine cancer   . Status post radiation therapy   . S/P small bowel resection     with Diarrhea  . Left carpal tunnel syndrome     By nerve conduction study  . Vitamin D deficiency   . Depression   . Insomnia    Past Surgical History:  Past Surgical History  Procedure Laterality Date  . Cataract extraction Bilateral   . Abdominal hysterectomy     HPI:  80 yo female referred by Dr Virgina Jock for MBS due to concerns pt may be having pharyngeal dysphagia.  Pt with PMH + for xerstomia, arthritis, pna, acid reflux, peripheral neuropathy, SBO 11/10/13 s/p surgical repair, uterine cancer s/p radiation treatment.  Pt dysphagia symptoms include  coughing during meals, sensation of food sticking in throat and proximal esophagus, chest pain during meals, and voice changes.  Dysphagia has been noticable x 6 months per pt.  She has not required the heimlich manuever nor has she lost weight.  Pt does state more problems with food than drinks and she has recently had to cough up a pill that she tried to swallow.      Assessment / Plan / Recommendation Clinical Impression  Dysphagia Diagnosis: Within Functional Limits  Clinical impression: Overall oropharyngeal swallow ability is within functional limits for a 80 yo patient.  Flash penetration of thin noted that cleared independently.  Mild vallecular residuals noted that pt sensed and cleared.  Piecemeal deglutition with liquids was functional.    Barium tablet given with thin liquid appeared with transient delay in passage through the thoracic esophagus WITH pt sensation.  Further liquid  swallows appeared to faciliate clearance - suspect this may be pt's primary deficit combining pt's symptoms and MBS results.  Question if pt's severe hoarseness could be attributed to reflux and pt denies improvement in symptoms currently (on reflux medicine x 1 week).    Educated pt to results of testing and compensation strategies to mitigate dysphagia.      Treatment Recommendation    none   Diet Recommendation Thin liquid;Regular   Liquid Administration via: Cup;Straw Medication Administration: Whole meds with liquid Supervision: Patient able to self feed Compensations: Slow rate;Small sips/bites;Follow solids with liquid (start meal with liquids, consume liquids throughout meal, eat several small meals) Postural Changes and/or Swallow Maneuvers: Seated upright 90 degrees;Upright 30-60 min after meal    Other  Recommendations Recommended Consults: Consider esophageal assessment Oral Care Recommendations: Oral care BID   Follow Up Recommendations  None      General Date of Onset: 08/29/14 HPI: 80 yo female referred by Dr Virgina Jock for MBS due to concerns pt may be having pharyngeal dysphagia.  Pt with PMH + for xerstomia, arthritis, pna, acid reflux, peripheral neuropathy, SBO 11/10/13 s/p surgical repair, uterine cancer s/p radiation treatment.  Pt dysphagia symptoms include  coughing during meals, sensation of food sticking in throat and proximal esophagus, chest pain during meals, and voice changes.  Dysphagia has been noticable x 6 months per pt.  She has not required the heimlich manuever nor has she lost weight.  Pt does state more problems with food than drinks and  she has recently had to cough up a pill that she tried to swallow.  Type of Study: Modified Barium Swallowing Study Reason for Referral: Objectively evaluate swallowing function Diet Prior to this Study: Regular;Thin liquids Temperature Spikes Noted: No Respiratory Status: Room air History of Recent Intubation:  No Behavior/Cognition: Alert;Cooperative;Pleasant mood Oral Cavity - Dentition: Adequate natural dentition Oral Motor / Sensory Function: Within functional limits Self-Feeding Abilities: Able to feed self Patient Positioning: Upright in bed Baseline Vocal Quality: Hoarse (voice was noticably hoarse!) Volitional Cough: Strong Volitional Swallow: Able to elicit Anatomy: Within functional limits    Reason for Referral Objectively evaluate swallowing function   Oral Phase Oral Preparation/Oral Phase Oral Phase: Impaired Oral - Nectar Oral - Nectar Cup: Piecemeal swallowing;Within functional limits Oral - Thin Oral - Thin Teaspoon: Piecemeal swallowing;Within functional limits Oral - Thin Cup: Piecemeal swallowing;Within functional limits Oral - Thin Straw: Piecemeal swallowing;Within functional limits Oral - Solids Oral - Puree: Piecemeal swallowing;Within functional limits Oral - Regular: Piecemeal swallowing;Within functional limits Oral - Pill: Piecemeal swallowing;Within functional limits (given with thin barium)   Pharyngeal Phase Pharyngeal Phase Pharyngeal Phase: Impaired Pharyngeal - Thin Pharyngeal - Thin Teaspoon: Within functional limits Pharyngeal - Thin Cup: Penetration/Aspiration during swallow;Within functional limits Penetration/Aspiration details (thin cup): Material enters airway, remains ABOVE vocal cords then ejected out Pharyngeal - Thin Straw: Within functional limits Pharyngeal - Solids Pharyngeal - Puree: Pharyngeal residue - valleculae Pharyngeal - Regular: Within functional limits Pharyngeal - Pill: Within functional limits  Cervical Esophageal Phase    GO    Cervical Esophageal Phase Cervical Esophageal Phase: Impaired Cervical Esophageal Phase - Comment Cervical Esophageal Comment: barium tablet given with thin liquid appeared with transient delay in passage through the thoracic esophagus WITH pt sensation, further liquid swallows faciliated  clearance - suspect this area may be pt's primary deficit combining pt's symptoms and MBS results    Functional Assessment Tool Used: mbs, clinical judgement Functional Limitations: Swallowing Swallow Current Status (H0623): At least 1 percent but less than 20 percent impaired, limited or restricted Swallow Goal Status [phone]): At least 1 percent but less than 20 percent impaired, limited or restricted Swallow Discharge Status [phone]): At least 1 percent but less than 20 percent impaired, limited or restricted    Luanna Salk, [facility] SLP [phone]

## 2014-09-03 ENCOUNTER — Encounter: Payer: Self-pay | Admitting: Neurology

## 2014-10-12 DIAGNOSIS — I272 Other secondary pulmonary hypertension: Secondary | ICD-10-CM | POA: Diagnosis not present

## 2014-10-12 DIAGNOSIS — I4891 Unspecified atrial fibrillation: Secondary | ICD-10-CM | POA: Diagnosis not present

## 2014-10-12 DIAGNOSIS — R627 Adult failure to thrive: Secondary | ICD-10-CM | POA: Diagnosis not present

## 2014-10-12 DIAGNOSIS — I2510 Atherosclerotic heart disease of native coronary artery without angina pectoris: Secondary | ICD-10-CM | POA: Diagnosis not present

## 2014-10-12 DIAGNOSIS — Z7901 Long term (current) use of anticoagulants: Secondary | ICD-10-CM | POA: Diagnosis not present

## 2014-10-12 DIAGNOSIS — I119 Hypertensive heart disease without heart failure: Secondary | ICD-10-CM | POA: Diagnosis not present

## 2014-10-12 DIAGNOSIS — R1310 Dysphagia, unspecified: Secondary | ICD-10-CM | POA: Diagnosis not present

## 2014-10-12 DIAGNOSIS — N3946 Mixed incontinence: Secondary | ICD-10-CM | POA: Diagnosis not present

## 2014-10-12 DIAGNOSIS — N182 Chronic kidney disease, stage 2 (mild): Secondary | ICD-10-CM | POA: Diagnosis not present

## 2014-11-12 ENCOUNTER — Encounter (HOSPITAL_COMMUNITY): Payer: Self-pay | Admitting: Emergency Medicine

## 2014-11-12 ENCOUNTER — Emergency Department (HOSPITAL_COMMUNITY)
Admission: EM | Admit: 2014-11-12 | Discharge: 2014-11-12 | Disposition: A | Discharge status: Home / Self Care | Payer: Commercial Managed Care - HMO | Attending: Emergency Medicine | Admitting: Emergency Medicine

## 2014-11-12 ENCOUNTER — Emergency Department (HOSPITAL_COMMUNITY): Payer: Commercial Managed Care - HMO

## 2014-11-12 DIAGNOSIS — Z7982 Long term (current) use of aspirin: Secondary | ICD-10-CM | POA: Insufficient documentation

## 2014-11-12 DIAGNOSIS — I700 Atherosclerosis of aorta: Secondary | ICD-10-CM | POA: Diagnosis not present

## 2014-11-12 DIAGNOSIS — Z880 Allergy status to penicillin: Secondary | ICD-10-CM | POA: Diagnosis not present

## 2014-11-12 DIAGNOSIS — R197 Diarrhea, unspecified: Secondary | ICD-10-CM | POA: Diagnosis not present

## 2014-11-12 DIAGNOSIS — K5730 Diverticulosis of large intestine without perforation or abscess without bleeding: Secondary | ICD-10-CM | POA: Diagnosis not present

## 2014-11-12 DIAGNOSIS — R1031 Right lower quadrant pain: Secondary | ICD-10-CM | POA: Insufficient documentation

## 2014-11-12 DIAGNOSIS — Z8542 Personal history of malignant neoplasm of other parts of uterus: Secondary | ICD-10-CM | POA: Diagnosis not present

## 2014-11-12 DIAGNOSIS — R112 Nausea with vomiting, unspecified: Secondary | ICD-10-CM | POA: Diagnosis not present

## 2014-11-12 DIAGNOSIS — Z79899 Other long term (current) drug therapy: Secondary | ICD-10-CM | POA: Insufficient documentation

## 2014-11-12 DIAGNOSIS — Z8669 Personal history of other diseases of the nervous system and sense organs: Secondary | ICD-10-CM | POA: Insufficient documentation

## 2014-11-12 DIAGNOSIS — E559 Vitamin D deficiency, unspecified: Secondary | ICD-10-CM | POA: Diagnosis not present

## 2014-11-12 DIAGNOSIS — R140 Abdominal distension (gaseous): Secondary | ICD-10-CM | POA: Diagnosis not present

## 2014-11-12 DIAGNOSIS — R1033 Periumbilical pain: Secondary | ICD-10-CM | POA: Diagnosis not present

## 2014-11-12 DIAGNOSIS — Z8659 Personal history of other mental and behavioral disorders: Secondary | ICD-10-CM | POA: Insufficient documentation

## 2014-11-12 DIAGNOSIS — E039 Hypothyroidism, unspecified: Secondary | ICD-10-CM | POA: Diagnosis not present

## 2014-11-12 DIAGNOSIS — Z90710 Acquired absence of both cervix and uterus: Secondary | ICD-10-CM | POA: Insufficient documentation

## 2014-11-12 DIAGNOSIS — R1030 Lower abdominal pain, unspecified: Secondary | ICD-10-CM

## 2014-11-12 LAB — URINALYSIS, ROUTINE W REFLEX MICROSCOPIC
BILIRUBIN URINE: NEGATIVE
Glucose, UA: NEGATIVE mg/dL
Hgb urine dipstick: NEGATIVE
KETONES UR: NEGATIVE mg/dL
LEUKOCYTES UA: NEGATIVE
NITRITE: NEGATIVE
PH: 5 (ref 5.0–8.0)
Protein, ur: NEGATIVE mg/dL
Specific Gravity, Urine: 1.022 (ref 1.005–1.030)
Urobilinogen, UA: 0.2 mg/dL (ref 0.0–1.0)

## 2014-11-12 LAB — COMPREHENSIVE METABOLIC PANEL
ALBUMIN: 4.3 g/dL (ref 3.5–5.2)
ALK PHOS: 64 U/L (ref 39–117)
ALT: 18 U/L (ref 0–35)
AST: 22 U/L (ref 0–37)
Anion gap: 9 (ref 5–15)
BUN: 14 mg/dL (ref 6–23)
CHLORIDE: 103 mmol/L (ref 96–112)
CO2: 25 mmol/L (ref 19–32)
Calcium: 9.6 mg/dL (ref 8.4–10.5)
Creatinine, Ser: 0.71 mg/dL (ref 0.50–1.10)
GFR calc non Af Amer: 74 mL/min — ABNORMAL LOW (ref >90)
GFR, EST AFRICAN AMERICAN: 85 mL/min — AB (ref >90)
Glucose, Bld: 137 mg/dL — ABNORMAL HIGH (ref 70–99)
POTASSIUM: 3.8 mmol/L (ref 3.5–5.1)
SODIUM: 137 mmol/L (ref 135–145)
Total Bilirubin: 0.5 mg/dL (ref 0.3–1.2)
Total Protein: 7.2 g/dL (ref 6.0–8.3)

## 2014-11-12 LAB — CBC WITH DIFFERENTIAL/PLATELET
Basophils Absolute: 0 10*3/uL (ref 0.0–0.1)
Basophils Relative: 0 % (ref 0–1)
Eosinophils Absolute: 0.2 10*3/uL (ref 0.0–0.7)
Eosinophils Relative: 2 % (ref 0–5)
HCT: 44.5 % (ref 36.0–46.0)
Hemoglobin: 15 g/dL (ref 12.0–15.0)
LYMPHS PCT: 15 % (ref 12–46)
Lymphs Abs: 1.7 10*3/uL (ref 0.7–4.0)
MCH: 31.3 pg (ref 26.0–34.0)
MCHC: 33.7 g/dL (ref 30.0–36.0)
MCV: 92.9 fL (ref 78.0–100.0)
MONO ABS: 0.6 10*3/uL (ref 0.1–1.0)
Monocytes Relative: 6 % (ref 3–12)
Neutro Abs: 8.5 10*3/uL — ABNORMAL HIGH (ref 1.7–7.7)
Neutrophils Relative %: 77 % (ref 43–77)
Platelets: 342 10*3/uL (ref 150–400)
RBC: 4.79 MIL/uL (ref 3.87–5.11)
RDW: 13.7 % (ref 11.5–15.5)
WBC: 11 10*3/uL — AB (ref 4.0–10.5)

## 2014-11-12 LAB — TROPONIN I

## 2014-11-12 LAB — I-STAT CG4 LACTIC ACID, ED: LACTIC ACID, VENOUS: 2.51 mmol/L — AB (ref 0.5–2.0)

## 2014-11-12 LAB — LIPASE, BLOOD: LIPASE: 26 U/L (ref 11–59)

## 2014-11-12 MED ORDER — ONDANSETRON HCL 4 MG/2ML IJ SOLN
4.0000 mg | Freq: Once | INTRAMUSCULAR | Status: AC
  Administered 2014-11-12: 4 mg via INTRAVENOUS
  Filled 2014-11-12: qty 2

## 2014-11-12 MED ORDER — SODIUM CHLORIDE 0.9 % IV SOLN
Freq: Once | INTRAVENOUS | Status: AC
  Administered 2014-11-12: 21:00:00 via INTRAVENOUS

## 2014-11-12 MED ORDER — IOHEXOL 300 MG/ML  SOLN
50.0000 mL | Freq: Once | INTRAMUSCULAR | Status: AC | PRN
  Administered 2014-11-12: 50 mL via ORAL

## 2014-11-12 MED ORDER — ONDANSETRON HCL 4 MG PO TABS: 4.0000 mg | ORAL_TABLET | Freq: Four times a day (QID) | ORAL | Status: DC

## 2014-11-12 MED ORDER — DICYCLOMINE HCL 20 MG PO TABS: 20.0000 mg | ORAL_TABLET | Freq: Two times a day (BID) | ORAL | Status: DC

## 2014-11-12 MED ORDER — IOHEXOL 300 MG/ML  SOLN
100.0000 mL | Freq: Once | INTRAMUSCULAR | Status: AC | PRN
  Administered 2014-11-12: 100 mL via INTRAVENOUS

## 2014-11-12 NOTE — ED Notes (Signed)
Requested UA sample from pt. Pt states that she just used the restroom in the waiting room and is unable to void at this time.

## 2014-11-12 NOTE — Discharge Instructions (Signed)
Abdominal Pain °Many things can cause abdominal pain. Usually, abdominal pain is not caused by a disease and will improve without treatment. It can often be observed and treated at home. Your health care provider will do a physical exam and possibly order blood tests and X-rays to help determine the seriousness of your pain. However, in many cases, more time must pass before a clear cause of the pain can be found. Before that point, your health care provider may not know if you need more testing or further treatment. °HOME CARE INSTRUCTIONS  °Monitor your abdominal pain for any changes. The following actions may help to alleviate any discomfort you are experiencing: °· Only take over-the-counter or prescription medicines as directed by your health care provider. °· Do not take laxatives unless directed to do so by your health care provider. °· Try a clear liquid diet (broth, tea, or water) as directed by your health care provider. Slowly move to a bland diet as tolerated. °SEEK MEDICAL CARE IF: °· You have unexplained abdominal pain. °· You have abdominal pain associated with nausea or diarrhea. °· You have pain when you urinate or have a bowel movement. °· You experience abdominal pain that wakes you in the night. °· You have abdominal pain that is worsened or improved by eating food. °· You have abdominal pain that is worsened with eating fatty foods. °· You have a fever. °SEEK IMMEDIATE MEDICAL CARE IF:  °· Your pain does not go away within 2 hours. °· You keep throwing up (vomiting). °· Your pain is felt only in portions of the abdomen, such as the right side or the left lower portion of the abdomen. °· You pass bloody or black tarry stools. °MAKE SURE YOU: °· Understand these instructions.   °· Will watch your condition.   °· Will get help right away if you are not doing well or get worse.   °Document Released: 07/07/2005 Document Revised: 10/02/2013 Document Reviewed: 06/06/2013 °[employer]® Patient Information  ©2015 [employer]. This information is not intended to replace advice given to you by your health care provider. Make sure you discuss any questions you have with your health care provider. ° °Diarrhea °Diarrhea is frequent loose and watery bowel movements. It can cause you to feel weak and dehydrated. Dehydration can cause you to become tired and thirsty, have a dry mouth, and have decreased urination that often is dark yellow. Diarrhea is a sign of another problem, most often an infection that will not last long. In most cases, diarrhea typically lasts 2-3 days. However, it can last longer if it is a sign of something more serious. It is important to treat your diarrhea as directed by your caregiver to lessen or prevent future episodes of diarrhea. °CAUSES  °Some common causes include: °· Gastrointestinal infections caused by viruses, bacteria, or parasites. °· Food poisoning or food allergies. °· Certain medicines, such as antibiotics, chemotherapy, and laxatives. °· Artificial sweeteners and fructose. °· Digestive disorders. °HOME CARE INSTRUCTIONS °· Ensure adequate fluid intake (hydration): Have 1 cup (8 oz) of fluid for each diarrhea episode. Avoid fluids that contain simple sugars or sports drinks, fruit juices, whole milk products, and sodas. Your urine should be clear or pale yellow if you are drinking enough fluids. Hydrate with an oral rehydration solution that you can purchase at pharmacies, retail stores, and online. You can prepare an oral rehydration solution at home by mixing the following ingredients together: °¨  - tsp table salt. °¨ ¾ tsp baking soda. °¨    tsp salt substitute containing potassium chloride. °¨ 1  tablespoons sugar. °¨ 1 L (34 oz) of water. °· Certain foods and beverages may increase the speed at which food moves through the gastrointestinal (GI) tract. These foods and beverages should be avoided and include: °¨ Caffeinated and alcoholic beverages. °¨ High-fiber foods, such as raw  fruits and vegetables, nuts, seeds, and whole grain breads and cereals. °¨ Foods and beverages sweetened with sugar alcohols, such as xylitol, sorbitol, and mannitol. °· Some foods may be well tolerated and may help thicken stool including: °¨ Starchy foods, such as rice, toast, pasta, low-sugar cereal, oatmeal, grits, baked potatoes, crackers, and bagels. °¨ Bananas. °¨ Applesauce. °· Add probiotic-rich foods to help increase healthy bacteria in the GI tract, such as yogurt and fermented milk products. °· Wash your hands well after each diarrhea episode. °· Only take over-the-counter or prescription medicines as directed by your caregiver. °· Take a warm bath to relieve any burning or pain from frequent diarrhea episodes. °SEEK IMMEDIATE MEDICAL CARE IF:  °· You are unable to keep fluids down. °· You have persistent vomiting. °· You have blood in your stool, or your stools are black and tarry. °· You do not urinate in 6-8 hours, or there is only a small amount of very dark urine. °· You have abdominal pain that increases or localizes. °· You have weakness, dizziness, confusion, or light-headedness. °· You have a severe headache. °· Your diarrhea gets worse or does not get better. °· You have a fever or persistent symptoms for more than 2-3 days. °· You have a fever and your symptoms suddenly get worse. °MAKE SURE YOU:  °· Understand these instructions. °· Will watch your condition. °· Will get help right away if you are not doing well or get worse. °Document Released: 09/17/2002 Document Revised: 02/11/2014 Document Reviewed: 06/04/2012 °[employer]® Patient Information ©2015 [employer]. This information is not intended to replace advice given to you by your health care provider. Make sure you discuss any questions you have with your health care provider. ° °

## 2014-11-12 NOTE — ED Notes (Signed)
MD at bedside. 

## 2014-11-12 NOTE — ED Notes (Signed)
Pt  Made aware of urine sample. Pt stated that she went before coming back to the room. Rn notifited

## 2014-11-12 NOTE — ED Notes (Signed)
Pt with Hx of bowel obstructions c/o RLQ sharp abdominal pain, emesis, diarrhea.

## 2014-11-12 NOTE — ED Provider Notes (Signed)
CSN: [id number]     Arrival date & time 11/12/14  1807 History   First MD Initiated Contact with Patient 11/12/14 1939     Chief Complaint  Patient presents with  . Abdominal Pain     (Consider location/radiation/quality/duration/timing/severity/associated sxs/prior Treatment) HPI Comments: Patient presents to the ER for evaluation of nausea, vomiting, diarrhea and abdominal pain. Patient reports a constant, sharp pain in the right lower abdomen. She has had watery diarrhea, but this is somewhat chronic for her. Patient is concerned because she has a history of bowel obstruction with similar symptoms in the past. She has not had a fever. She does report "indigestion".  Patient is a 81 y.o. female presenting with abdominal pain.  Abdominal Pain Associated symptoms: diarrhea, nausea and vomiting     Past Medical History  Diagnosis Date  . Peripheral neuropathy   . Hypothyroidism   . Depression   . Uterine cancer   . Status post radiation therapy   . S/P small bowel resection     with Diarrhea  . Left carpal tunnel syndrome     By nerve conduction study  . Vitamin D deficiency   . Depression   . Insomnia    Past Surgical History  Procedure Laterality Date  . Cataract extraction Bilateral   . Abdominal hysterectomy     Family History  Problem Relation Age of Onset  . Stroke Mother   . Aneurysm Father   . Diabetes Brother    History  Substance Use Topics  . Smoking status: Never Smoker   . Smokeless tobacco: Never Used  . Alcohol Use: No   OB History    No data available     Review of Systems  Gastrointestinal: Positive for nausea, vomiting, abdominal pain and diarrhea.  All other systems reviewed and are negative.     Allergies  Codeine and Penicillins  Home Medications   Prior to Admission medications   Medication Sig Start Date End Date Taking? Authorizing Provider  aspirin 81 MG EC tablet Take 81 mg by mouth at bedtime. Swallow whole.   Yes Historical  Provider, MD  Calcium Carbonate (CALTRATE 600 PO) Take 600 mg by mouth daily.    Yes Historical Provider, MD  famotidine-calcium carbonate-magnesium hydroxide (ACID CONTROLLER COMPLETE) 10-800-165 MG CHEW chewable tablet Chew 1 tablet by mouth daily as needed (heartburn).    Yes Historical Provider, MD  levothyroxine (SYNTHROID, LEVOTHROID) 25 MCG tablet Take 25 mcg by mouth daily.   Yes Historical Provider, MD  lisinopril (PRINIVIL,ZESTRIL) 10 MG tablet Take 10 mg by mouth daily.   Yes Historical Provider, MD  loperamide (IMODIUM) 2 MG capsule Take 1 capsule (2 mg total) by mouth 5 (five) times daily as needed for diarrhea or loose stools. 10/23/13  Yes [name], MD  Multiple Vitamins-Minerals (MULTIVITAMIN WITH MINERALS) tablet Take 1 tablet by mouth daily.   Yes Historical Provider, MD  NON FORMULARY Take 1 capsule by mouth daily. Juice Plus. Food Supplement.   Yes Historical Provider, MD  omeprazole (PRILOSEC) 40 MG capsule Take 40 mg by mouth daily.   Yes Historical Provider, MD  raloxifene (EVISTA) 60 MG tablet Take 60 mg by mouth daily.   Yes Historical Provider, MD  VITAMIN B1-B12 IJ Inject 1 mL into the skin every 21 ( twenty-one) days.    Yes Historical Provider, MD  Vitamin D, Ergocalciferol, (DRISDOL) 50000 UNITS CAPS Take 50,000 Units by mouth every 7 (seven) days.    Yes Historical Provider, MD  BP 151/73 mmHg  Pulse 86  Temp(Src) 97.7 F (36.5 C) (Oral)  Resp 20  SpO2 94% Physical Exam  Constitutional: She is oriented to person, place, and time. She appears well-developed and well-nourished. No distress.  HENT:  Head: Normocephalic and atraumatic.  Right Ear: Hearing normal.  Left Ear: Hearing normal.  Nose: Nose normal.  Mouth/Throat: Oropharynx is clear and moist and mucous membranes are normal.  Eyes: Conjunctivae and EOM are normal. Pupils are equal, round, and reactive to light.  Neck: Normal range of motion. Neck supple.  Cardiovascular: Regular rhythm, S1 normal  and S2 normal.  Exam reveals no gallop and no friction rub.   No murmur heard. Pulmonary/Chest: Effort normal and breath sounds normal. No respiratory distress. She exhibits no tenderness.  Abdominal: Soft. Normal appearance and bowel sounds are normal. There is no hepatosplenomegaly. There is tenderness in the right lower quadrant and suprapubic area. There is no rebound, no guarding, no tenderness at McBurney's point and negative Murphy's sign. No hernia.  Musculoskeletal: Normal range of motion.  Neurological: She is alert and oriented to person, place, and time. She has normal strength. No cranial nerve deficit or sensory deficit. Coordination normal. GCS eye subscore is 4. GCS verbal subscore is 5. GCS motor subscore is 6.  Skin: Skin is warm, dry and intact. No rash noted. No cyanosis.  Psychiatric: She has a normal mood and affect. Her speech is normal and behavior is normal. Thought content normal.  Nursing note and vitals reviewed.   ED Course  Procedures (including critical care time) Labs Review Labs Reviewed  CBC WITH DIFFERENTIAL/PLATELET - Abnormal; Notable for the following:    WBC 11.0 (*)    Neutro Abs 8.5 (*)    All other components within normal limits  COMPREHENSIVE METABOLIC PANEL - Abnormal; Notable for the following:    Glucose, Bld 137 (*)    GFR calc non Af Amer 74 (*)    GFR calc Af Amer 85 (*)    All other components within normal limits  I-STAT CG4 LACTIC ACID, ED - Abnormal; Notable for the following:    Lactic Acid, Venous 2.51 (*)    All other components within normal limits  LIPASE, BLOOD  URINALYSIS, ROUTINE W REFLEX MICROSCOPIC  TROPONIN I    Imaging Review Ct Abdomen Pelvis W Contrast  11/12/2014   CLINICAL DATA:  Acute onset of sharp right lower quadrant abdominal pain, vomiting and diarrhea. Initial encounter.  EXAM: CT ABDOMEN AND PELVIS WITH CONTRAST  TECHNIQUE: Multidetector CT imaging of the abdomen and pelvis was performed using the standard  protocol following bolus administration of intravenous contrast.  CONTRAST:  163mL OMNIPAQUE IOHEXOL 300 MG/ML SOLN, 41mL OMNIPAQUE IOHEXOL 300 MG/ML SOLN  COMPARISON:  Abdominal radiograph performed 10/21/2013  FINDINGS: Minimal bibasilar atelectasis is noted. Scattered coronary artery calcification is seen. A small hiatal hernia is noted, filled with fluid.  The liver and spleen are unremarkable in appearance. The gallbladder is within normal limits. The pancreas and adrenal glands are unremarkable.  Scattered hypodense and isodense lesions are noted within the kidneys bilaterally, without evidence of enhancement or washout. These likely reflect benign cysts. There is no evidence of hydronephrosis. No renal or ureteral stones are identified. No significant perinephric stranding is appreciated.  Mild distention of the proximal small bowel is thought reflect recently ingested material. Scattered diverticulosis is noted along the mid ileum. The stomach is otherwise unremarkable. No acute vascular abnormalities are seen. Scattered calcification is seen along the  abdominal aorta and its branches.  The patient appears to be status post appendectomy. Scattered diverticulosis is noted along the distal transverse, descending and proximal sigmoid colon, without evidence of diverticulitis. Trace free fluid within the pelvis is nonspecific in nature.  The bladder is mildly distended and grossly unremarkable. The patient is status post hysterectomy. No suspicious adnexal masses are seen. No inguinal lymphadenopathy is seen.  No acute osseous abnormalities are identified. There is minimal grade 1 anterolisthesis of L4 on L5, reflecting underlying facet disease.  IMPRESSION: 1. No acute abnormality seen to explain the patient's symptoms. 2. Mild distention of the proximal small bowel is thought to reflect recently ingested material, without definite evidence of bowel obstruction. 3. Trace free fluid in the pelvis, nonspecific in  appearance. 4. Scattered diverticulosis along the distal transverse, descending and sigmoid colon, without evidence of diverticulitis. The proximal colon is largely filled with fluid. 5. Scattered diverticulosis along the mid ileum. 6. Small hiatal hernia noted, filled with fluid. 7. Scattered calcification along the abdominal aorta and its branches. 8. Hypodense and isodense lesions within the kidneys bilaterally, without evidence of enhancement or washout. These likely reflect benign cysts. 9. Scattered coronary artery calcification seen.   Electronically Signed   By: Garald Balding M.D.   On: 11/12/2014 21:45     EKG Interpretation   Date/Time:  Tuesday November 12 2014 20:13:50 EST Ventricular Rate:  91 PR Interval:  204 QRS Duration: 123 QT Interval:  432 QTC Calculation: 532 R Axis:   -54 Text Interpretation:  Sinus rhythm Atrial premature complex Left bundle  branch block No significant change since last tracing Confirmed by POLLINA   MD, CHRISTOPHER [id number]) on 11/12/2014 9:49:20 PM      MDM   Final diagnoses:  Lower abdominal pain    Patient presents to the ER for evaluation of abdominal pain. Patient and daughter are concerned about the possibility of bowel obstruction, because she has had one previously. Her previous bowel obstruction was because of radiation changes from treatment many years ago. Patient has been having bowel movements today, however. She did have nausea and vomiting earlier. Pain is on the right side of her abdomen. She had tenderness without guarding or rebound. Lab work unremarkable. Urinalysis does not suggest infection. CT scan performed, no evidence of obstruction. No acute pathology seen. Patient reassured, will be treated symptomatically and follow-up with primary doctor.    Orpah Greek, MD 11/12/14 2233

## 2014-12-16 DIAGNOSIS — Z681 Body mass index (BMI) 19 or less, adult: Secondary | ICD-10-CM | POA: Diagnosis not present

## 2014-12-16 DIAGNOSIS — E039 Hypothyroidism, unspecified: Secondary | ICD-10-CM | POA: Diagnosis not present

## 2014-12-16 DIAGNOSIS — I10 Essential (primary) hypertension: Secondary | ICD-10-CM | POA: Diagnosis not present

## 2014-12-16 DIAGNOSIS — N3946 Mixed incontinence: Secondary | ICD-10-CM | POA: Diagnosis not present

## 2014-12-16 DIAGNOSIS — I2510 Atherosclerotic heart disease of native coronary artery without angina pectoris: Secondary | ICD-10-CM | POA: Diagnosis not present

## 2014-12-16 DIAGNOSIS — E40 Kwashiorkor: Secondary | ICD-10-CM | POA: Diagnosis not present

## 2014-12-16 DIAGNOSIS — I429 Cardiomyopathy, unspecified: Secondary | ICD-10-CM | POA: Diagnosis not present

## 2014-12-16 DIAGNOSIS — Z1389 Encounter for screening for other disorder: Secondary | ICD-10-CM | POA: Diagnosis not present

## 2015-06-12 DIAGNOSIS — I2510 Atherosclerotic heart disease of native coronary artery without angina pectoris: Secondary | ICD-10-CM | POA: Diagnosis not present

## 2015-06-12 DIAGNOSIS — I6529 Occlusion and stenosis of unspecified carotid artery: Secondary | ICD-10-CM | POA: Diagnosis not present

## 2015-06-12 DIAGNOSIS — E039 Hypothyroidism, unspecified: Secondary | ICD-10-CM | POA: Diagnosis not present

## 2015-06-12 DIAGNOSIS — E781 Pure hyperglyceridemia: Secondary | ICD-10-CM | POA: Diagnosis not present

## 2015-06-12 DIAGNOSIS — M810 Age-related osteoporosis without current pathological fracture: Secondary | ICD-10-CM | POA: Diagnosis not present

## 2015-06-12 DIAGNOSIS — E538 Deficiency of other specified B group vitamins: Secondary | ICD-10-CM | POA: Diagnosis not present

## 2015-06-12 DIAGNOSIS — I10 Essential (primary) hypertension: Secondary | ICD-10-CM | POA: Diagnosis not present

## 2015-06-20 DIAGNOSIS — I2510 Atherosclerotic heart disease of native coronary artery without angina pectoris: Secondary | ICD-10-CM | POA: Diagnosis not present

## 2015-06-20 DIAGNOSIS — R1310 Dysphagia, unspecified: Secondary | ICD-10-CM | POA: Diagnosis not present

## 2015-06-20 DIAGNOSIS — M25559 Pain in unspecified hip: Secondary | ICD-10-CM | POA: Diagnosis not present

## 2015-06-20 DIAGNOSIS — Z0000 Encounter for general adult medical examination without abnormal findings: Secondary | ICD-10-CM | POA: Diagnosis not present

## 2015-06-20 DIAGNOSIS — I429 Cardiomyopathy, unspecified: Secondary | ICD-10-CM | POA: Diagnosis not present

## 2015-06-20 DIAGNOSIS — M1990 Unspecified osteoarthritis, unspecified site: Secondary | ICD-10-CM | POA: Diagnosis not present

## 2015-06-20 DIAGNOSIS — G4700 Insomnia, unspecified: Secondary | ICD-10-CM | POA: Diagnosis not present

## 2015-06-20 DIAGNOSIS — E40 Kwashiorkor: Secondary | ICD-10-CM | POA: Diagnosis not present

## 2015-06-20 DIAGNOSIS — R269 Unspecified abnormalities of gait and mobility: Secondary | ICD-10-CM | POA: Diagnosis not present

## 2015-07-03 DIAGNOSIS — M810 Age-related osteoporosis without current pathological fracture: Secondary | ICD-10-CM | POA: Diagnosis not present

## 2015-07-09 DIAGNOSIS — Z1212 Encounter for screening for malignant neoplasm of rectum: Secondary | ICD-10-CM | POA: Diagnosis not present

## 2015-07-31 ENCOUNTER — Inpatient Hospital Stay (HOSPITAL_COMMUNITY)
Admission: EM | Admit: 2015-07-31 | Discharge: 2015-08-06 | DRG: 189 | Disposition: A | Discharge status: Skilled Nursing Facility | Payer: Commercial Managed Care - HMO | Attending: Internal Medicine | Admitting: Internal Medicine

## 2015-07-31 DIAGNOSIS — M810 Age-related osteoporosis without current pathological fracture: Secondary | ICD-10-CM | POA: Diagnosis present

## 2015-07-31 DIAGNOSIS — J9601 Acute respiratory failure with hypoxia: Principal | ICD-10-CM | POA: Diagnosis present

## 2015-07-31 DIAGNOSIS — I2510 Atherosclerotic heart disease of native coronary artery without angina pectoris: Secondary | ICD-10-CM | POA: Diagnosis present

## 2015-07-31 DIAGNOSIS — E46 Unspecified protein-calorie malnutrition: Secondary | ICD-10-CM | POA: Diagnosis present

## 2015-07-31 DIAGNOSIS — Z681 Body mass index (BMI) 19 or less, adult: Secondary | ICD-10-CM | POA: Diagnosis not present

## 2015-07-31 DIAGNOSIS — K589 Irritable bowel syndrome without diarrhea: Secondary | ICD-10-CM | POA: Diagnosis present

## 2015-07-31 DIAGNOSIS — F329 Major depressive disorder, single episode, unspecified: Secondary | ICD-10-CM | POA: Diagnosis present

## 2015-07-31 DIAGNOSIS — K912 Postsurgical malabsorption, not elsewhere classified: Secondary | ICD-10-CM | POA: Diagnosis present

## 2015-07-31 DIAGNOSIS — N182 Chronic kidney disease, stage 2 (mild): Secondary | ICD-10-CM | POA: Diagnosis present

## 2015-07-31 DIAGNOSIS — E861 Hypovolemia: Secondary | ICD-10-CM | POA: Diagnosis present

## 2015-07-31 DIAGNOSIS — R197 Diarrhea, unspecified: Secondary | ICD-10-CM | POA: Diagnosis present

## 2015-07-31 DIAGNOSIS — I272 Other secondary pulmonary hypertension: Secondary | ICD-10-CM | POA: Diagnosis present

## 2015-07-31 DIAGNOSIS — E781 Pure hyperglyceridemia: Secondary | ICD-10-CM | POA: Diagnosis present

## 2015-07-31 DIAGNOSIS — Z823 Family history of stroke: Secondary | ICD-10-CM

## 2015-07-31 DIAGNOSIS — Z79899 Other long term (current) drug therapy: Secondary | ICD-10-CM

## 2015-07-31 DIAGNOSIS — G4700 Insomnia, unspecified: Secondary | ICD-10-CM | POA: Diagnosis present

## 2015-07-31 DIAGNOSIS — R0602 Shortness of breath: Secondary | ICD-10-CM | POA: Diagnosis present

## 2015-07-31 DIAGNOSIS — R911 Solitary pulmonary nodule: Secondary | ICD-10-CM | POA: Diagnosis present

## 2015-07-31 DIAGNOSIS — Z880 Allergy status to penicillin: Secondary | ICD-10-CM

## 2015-07-31 DIAGNOSIS — J189 Pneumonia, unspecified organism: Secondary | ICD-10-CM | POA: Diagnosis present

## 2015-07-31 DIAGNOSIS — Z885 Allergy status to narcotic agent status: Secondary | ICD-10-CM

## 2015-07-31 DIAGNOSIS — Z7982 Long term (current) use of aspirin: Secondary | ICD-10-CM

## 2015-07-31 DIAGNOSIS — M8580 Other specified disorders of bone density and structure, unspecified site: Secondary | ICD-10-CM | POA: Diagnosis present

## 2015-07-31 DIAGNOSIS — Z8542 Personal history of malignant neoplasm of other parts of uterus: Secondary | ICD-10-CM

## 2015-07-31 DIAGNOSIS — I482 Chronic atrial fibrillation, unspecified: Secondary | ICD-10-CM | POA: Diagnosis present

## 2015-07-31 DIAGNOSIS — R0600 Dyspnea, unspecified: Secondary | ICD-10-CM | POA: Diagnosis not present

## 2015-07-31 DIAGNOSIS — E876 Hypokalemia: Secondary | ICD-10-CM | POA: Diagnosis present

## 2015-07-31 DIAGNOSIS — E538 Deficiency of other specified B group vitamins: Secondary | ICD-10-CM | POA: Diagnosis present

## 2015-07-31 DIAGNOSIS — R159 Full incontinence of feces: Secondary | ICD-10-CM | POA: Diagnosis present

## 2015-07-31 DIAGNOSIS — R269 Unspecified abnormalities of gait and mobility: Secondary | ICD-10-CM | POA: Diagnosis present

## 2015-07-31 DIAGNOSIS — I480 Paroxysmal atrial fibrillation: Secondary | ICD-10-CM | POA: Diagnosis present

## 2015-07-31 DIAGNOSIS — Z90710 Acquired absence of both cervix and uterus: Secondary | ICD-10-CM | POA: Diagnosis not present

## 2015-07-31 DIAGNOSIS — E872 Acidosis, unspecified: Secondary | ICD-10-CM | POA: Diagnosis present

## 2015-07-31 DIAGNOSIS — I4891 Unspecified atrial fibrillation: Secondary | ICD-10-CM | POA: Diagnosis present

## 2015-07-31 DIAGNOSIS — R069 Unspecified abnormalities of breathing: Secondary | ICD-10-CM | POA: Diagnosis not present

## 2015-07-31 DIAGNOSIS — E871 Hypo-osmolality and hyponatremia: Secondary | ICD-10-CM | POA: Diagnosis present

## 2015-07-31 DIAGNOSIS — R1310 Dysphagia, unspecified: Secondary | ICD-10-CM | POA: Diagnosis present

## 2015-07-31 DIAGNOSIS — G629 Polyneuropathy, unspecified: Secondary | ICD-10-CM | POA: Diagnosis present

## 2015-07-31 DIAGNOSIS — I447 Left bundle-branch block, unspecified: Secondary | ICD-10-CM | POA: Diagnosis present

## 2015-07-31 DIAGNOSIS — I43 Cardiomyopathy in diseases classified elsewhere: Secondary | ICD-10-CM | POA: Diagnosis present

## 2015-07-31 DIAGNOSIS — K219 Gastro-esophageal reflux disease without esophagitis: Secondary | ICD-10-CM | POA: Diagnosis present

## 2015-07-31 DIAGNOSIS — I1310 Hypertensive heart and chronic kidney disease without heart failure, with stage 1 through stage 4 chronic kidney disease, or unspecified chronic kidney disease: Secondary | ICD-10-CM | POA: Diagnosis present

## 2015-07-31 DIAGNOSIS — I248 Other forms of acute ischemic heart disease: Secondary | ICD-10-CM | POA: Diagnosis present

## 2015-07-31 DIAGNOSIS — E039 Hypothyroidism, unspecified: Secondary | ICD-10-CM | POA: Diagnosis present

## 2015-07-31 DIAGNOSIS — Z5189 Encounter for other specified aftercare: Secondary | ICD-10-CM | POA: Diagnosis not present

## 2015-07-31 DIAGNOSIS — Z66 Do not resuscitate: Secondary | ICD-10-CM | POA: Diagnosis present

## 2015-07-31 DIAGNOSIS — R32 Unspecified urinary incontinence: Secondary | ICD-10-CM | POA: Diagnosis present

## 2015-07-31 DIAGNOSIS — A419 Sepsis, unspecified organism: Secondary | ICD-10-CM | POA: Diagnosis not present

## 2015-07-31 DIAGNOSIS — Z923 Personal history of irradiation: Secondary | ICD-10-CM | POA: Diagnosis not present

## 2015-07-31 DIAGNOSIS — E038 Other specified hypothyroidism: Secondary | ICD-10-CM

## 2015-07-31 NOTE — ED Provider Notes (Signed)
CSN: [id number]     Arrival date & time 07/31/15  2350 History  By signing my name below, I, Helane Gunther, attest that this documentation has been prepared under the direction and in the presence of Everlene Balls, MD. Electronically Signed: Helane Gunther, ED Scribe. 08/01/2015. 12:40 AM.    Chief Complaint  Patient presents with  . Atrial Fibrillation  . Shortness of Breath   The history is provided by the patient and the EMS personnel. No language interpreter was used.   HPI Comments: Teresa Gonzalez is a 81 y.o. female brought in by ambulance, who presents to the Emergency Department complaining of A-Fib and SOB that began just PTA. Pt states she was lying in bed when the symptoms began. She reports associated chest tightness and SOB.   Per EMS pt had difficulty breathing when they arrived. She was given 10 albuterol and 0.5 atrovent at home. As well as Adenosine x 2 (6 and 12) and 10mg  Cardizem IV, and 450 cc NaCl. Her BP dropped from 381 systolic to 829/93. Per EMS, pt has not improved and still c/o CP, SOB, and rapid HR. They note that per husband, pt had a physical done one week ago, which came back normal. Pt is from home.   Past Medical History  Diagnosis Date  . Peripheral neuropathy ([place])   . Hypothyroidism   . Depression   . Uterine cancer ([place])   . Status post radiation therapy   . S/P small bowel resection     with Diarrhea  . Left carpal tunnel syndrome     By nerve conduction study  . Vitamin D deficiency   . Depression   . Insomnia    Past Surgical History  Procedure Laterality Date  . Cataract extraction Bilateral   . Abdominal hysterectomy     Family History  Problem Relation Age of Onset  . Stroke Mother   . Aneurysm Father   . Diabetes Brother    Social History  Substance Use Topics  . Smoking status: Never Smoker   . Smokeless tobacco: Never Used  . Alcohol Use: No   OB History    No data available     Review of Systems A complete 10 system  review of systems was obtained and all systems are negative except as noted in the HPI and PMH.   Allergies  Codeine and Penicillins  Home Medications   Prior to Admission medications   Medication Sig Start Date End Date Taking? Authorizing Provider  aspirin 81 MG EC tablet Take 81 mg by mouth at bedtime. Swallow whole.    Historical Provider, MD  Calcium Carbonate (CALTRATE 600 PO) Take 600 mg by mouth daily.     Historical Provider, MD  dicyclomine (BENTYL) 20 MG tablet Take 1 tablet (20 mg total) by mouth 2 (two) times daily. 11/12/14   Orpah Greek, MD  famotidine-calcium carbonate-magnesium hydroxide (ACID CONTROLLER COMPLETE) 10-800-165 MG CHEW chewable tablet Chew 1 tablet by mouth daily as needed (heartburn).     Historical Provider, MD  levothyroxine (SYNTHROID, LEVOTHROID) 25 MCG tablet Take 25 mcg by mouth daily.    Historical Provider, MD  lisinopril (PRINIVIL,ZESTRIL) 10 MG tablet Take 10 mg by mouth daily.    Historical Provider, MD  loperamide (IMODIUM) 2 MG capsule Take 1 capsule (2 mg total) by mouth 5 (five) times daily as needed for diarrhea or loose stools. 10/23/13   Shon Baton, MD  Multiple Vitamins-Minerals (MULTIVITAMIN WITH MINERALS) tablet Take 1  tablet by mouth daily.    Historical Provider, MD  NON FORMULARY Take 1 capsule by mouth daily. Juice Plus. Food Supplement.    Historical Provider, MD  omeprazole (PRILOSEC) 40 MG capsule Take 40 mg by mouth daily.    Historical Provider, MD  ondansetron (ZOFRAN) 4 MG tablet Take 1 tablet (4 mg total) by mouth every 6 (six) hours. 11/12/14   Orpah Greek, MD  raloxifene (EVISTA) 60 MG tablet Take 60 mg by mouth daily.    Historical Provider, MD  VITAMIN B1-B12 IJ Inject 1 mL into the skin every 21 ( twenty-one) days.     Historical Provider, MD  Vitamin D, Ergocalciferol, (DRISDOL) 50000 UNITS CAPS Take 50,000 Units by mouth every 7 (seven) days.     Historical Provider, MD   BP 112/76 mmHg  Pulse 151   Temp(Src) 101.8 F (38.8 C) (Rectal)  Resp 22  Ht 5\' 5"  (1.651 m)  Wt 109 lb (49.442 kg)  BMI 18.14 kg/m2  SpO2 96% Physical Exam  Constitutional: She is oriented to person, place, and time. She appears well-developed and well-nourished. She appears distressed.  HENT:  Head: Normocephalic and atraumatic.  Nose: Nose normal.  Mouth/Throat: Oropharynx is clear and moist. No oropharyngeal exudate.  Eyes: Conjunctivae and EOM are normal. Pupils are equal, round, and reactive to light. No scleral icterus.  Neck: Normal range of motion. Neck supple. No JVD present. No tracheal deviation present. No thyromegaly present.  Cardiovascular: Exam reveals no gallop and no friction rub.   No murmur heard. Tachycardic with irregular rhythm  Pulmonary/Chest: She is in respiratory distress. She has no wheezes. She exhibits no tenderness.  tachypnic with bilateral crackles at the bases  Abdominal: Soft. Bowel sounds are normal. She exhibits no distension and no mass. There is no tenderness. There is no rebound and no guarding.  Musculoskeletal: Normal range of motion. She exhibits no edema or tenderness.  Lymphadenopathy:    She has no cervical adenopathy.  Neurological: She is alert and oriented to person, place, and time. No cranial nerve deficit. She exhibits normal muscle tone.  Skin: Skin is warm and dry. No rash noted. No erythema. No pallor.  Nursing note and vitals reviewed.   ED Course  Procedures  DIAGNOSTIC STUDIES: Oxygen Saturation is 98% on RA, normal by my interpretation.    COORDINATION OF CARE: 11:59 PM - Discussed plans to order diagnostic studies. Pt advised of plan for treatment and pt agrees.  Labs Review Labs Reviewed  BRAIN NATRIURETIC PEPTIDE - Abnormal; Notable for the following:    B Natriuretic Peptide 273.1 (*)    All other components within normal limits  BASIC METABOLIC PANEL - Abnormal; Notable for the following:    Sodium 126 (*)    Potassium 3.2 (*)     Chloride 96 (*)    CO2 20 (*)    Glucose, Bld 179 (*)    Calcium 7.9 (*)    All other components within normal limits  MAGNESIUM - Abnormal; Notable for the following:    Magnesium 1.3 (*)    All other components within normal limits  I-STAT CG4 LACTIC ACID, ED - Abnormal; Notable for the following:    Lactic Acid, Venous 3.38 (*)    All other components within normal limits  I-STAT TROPOININ, ED - Abnormal; Notable for the following:    Troponin i, poc 0.10 (*)    All other components within normal limits  CULTURE, BLOOD (ROUTINE X 2)  CULTURE,  BLOOD (ROUTINE X 2)  URINE CULTURE  CBC WITH DIFFERENTIAL/PLATELET  URINALYSIS, ROUTINE W REFLEX MICROSCOPIC (NOT AT [facility])  I-STAT CG4 LACTIC ACID, ED    Imaging Review Dg Chest Port 1 View  08/01/2015  CLINICAL DATA:  Acute onset of shortness of breath and generalized chest pain. Initial encounter. EXAM: PORTABLE CHEST 1 VIEW COMPARISON:  Chest radiograph performed 10/20/2013 FINDINGS: The lungs are well-aerated. Right upper lobe airspace opacity is compatible with pneumonia. Mild left basilar airspace opacity is also seen. There is no evidence of pleural effusion or pneumothorax. The cardiomediastinal silhouette is borderline normal in size. No acute osseous abnormalities are seen. IMPRESSION: Right upper lobe pneumonia noted. Mild left basilar airspace opacity likely also reflects pneumonia. Followup PA and lateral chest X-ray is recommended in 3-4 weeks following trial of antibiotic therapy, as deemed clinically appropriate, to ensure resolution and exclude underlying malignancy. Electronically Signed   By: Garald Balding M.D.   On: 08/01/2015 00:34   I have personally reviewed and evaluated these images and lab results as part of my medical decision-making.   EKG Interpretation None      MDM   Final diagnoses:  None   Patient presents to the ED for chest pain ans SOB.  ED evaluation reveals sepsis as likely cause, bilateral  pneumonia.  Patient is febrile and tachycardic as well.  Given PR tylenol and IVF.  Sepsis protocol ordered, ICU has been consulted for possible admission as the patient is currently on Bipap.   CRITICAL CARE Performed by: Everlene Balls   Total critical care time: 27min - sepsis  Critical care time was exclusive of separately billable procedures and treating other patients.  Critical care was necessary to treat or prevent imminent or life-threatening deterioration.  Critical care was time spent personally by me on the following activities: development of treatment plan with patient and/or surrogate as well as nursing, discussions with consultants, evaluation of patient's response to treatment, examination of patient, obtaining history from patient or surrogate, ordering and performing treatments and interventions, ordering and review of laboratory studies, ordering and review of radiographic studies, pulse oximetry and re-evaluation of patient's condition.    I, Sinclair Arrazola, personally performed the services described in this documentation. All medical record entries made by the scribe were at my direction and in my presence.  I have reviewed the chart and discharge instructions and agree that the record reflects my personal performance and is accurate and complete. Monesha Monreal.  08/01/2015. 1:56 AM.     Everlene Balls, MD 08/01/15 1655

## 2015-08-01 ENCOUNTER — Encounter (HOSPITAL_COMMUNITY): Payer: Self-pay | Admitting: Emergency Medicine

## 2015-08-01 ENCOUNTER — Emergency Department (HOSPITAL_COMMUNITY): Payer: Commercial Managed Care - HMO

## 2015-08-01 DIAGNOSIS — Z681 Body mass index (BMI) 19 or less, adult: Secondary | ICD-10-CM | POA: Diagnosis not present

## 2015-08-01 DIAGNOSIS — A419 Sepsis, unspecified organism: Secondary | ICD-10-CM | POA: Diagnosis not present

## 2015-08-01 DIAGNOSIS — K912 Postsurgical malabsorption, not elsewhere classified: Secondary | ICD-10-CM | POA: Diagnosis present

## 2015-08-01 DIAGNOSIS — I482 Chronic atrial fibrillation, unspecified: Secondary | ICD-10-CM | POA: Diagnosis present

## 2015-08-01 DIAGNOSIS — K589 Irritable bowel syndrome without diarrhea: Secondary | ICD-10-CM | POA: Diagnosis present

## 2015-08-01 DIAGNOSIS — R0602 Shortness of breath: Secondary | ICD-10-CM | POA: Diagnosis present

## 2015-08-01 DIAGNOSIS — F329 Major depressive disorder, single episode, unspecified: Secondary | ICD-10-CM | POA: Diagnosis present

## 2015-08-01 DIAGNOSIS — E872 Acidosis, unspecified: Secondary | ICD-10-CM | POA: Diagnosis present

## 2015-08-01 DIAGNOSIS — M810 Age-related osteoporosis without current pathological fracture: Secondary | ICD-10-CM | POA: Diagnosis present

## 2015-08-01 DIAGNOSIS — I248 Other forms of acute ischemic heart disease: Secondary | ICD-10-CM | POA: Diagnosis present

## 2015-08-01 DIAGNOSIS — E538 Deficiency of other specified B group vitamins: Secondary | ICD-10-CM | POA: Diagnosis present

## 2015-08-01 DIAGNOSIS — N182 Chronic kidney disease, stage 2 (mild): Secondary | ICD-10-CM | POA: Diagnosis present

## 2015-08-01 DIAGNOSIS — I447 Left bundle-branch block, unspecified: Secondary | ICD-10-CM | POA: Diagnosis present

## 2015-08-01 DIAGNOSIS — I1310 Hypertensive heart and chronic kidney disease without heart failure, with stage 1 through stage 4 chronic kidney disease, or unspecified chronic kidney disease: Secondary | ICD-10-CM | POA: Diagnosis present

## 2015-08-01 DIAGNOSIS — E039 Hypothyroidism, unspecified: Secondary | ICD-10-CM | POA: Diagnosis present

## 2015-08-01 DIAGNOSIS — Z885 Allergy status to narcotic agent status: Secondary | ICD-10-CM | POA: Diagnosis not present

## 2015-08-01 DIAGNOSIS — J189 Pneumonia, unspecified organism: Secondary | ICD-10-CM | POA: Diagnosis present

## 2015-08-01 DIAGNOSIS — I43 Cardiomyopathy in diseases classified elsewhere: Secondary | ICD-10-CM | POA: Diagnosis present

## 2015-08-01 DIAGNOSIS — I272 Other secondary pulmonary hypertension: Secondary | ICD-10-CM | POA: Diagnosis present

## 2015-08-01 DIAGNOSIS — Z7982 Long term (current) use of aspirin: Secondary | ICD-10-CM | POA: Diagnosis not present

## 2015-08-01 DIAGNOSIS — E871 Hypo-osmolality and hyponatremia: Secondary | ICD-10-CM | POA: Diagnosis present

## 2015-08-01 DIAGNOSIS — R0600 Dyspnea, unspecified: Secondary | ICD-10-CM | POA: Diagnosis not present

## 2015-08-01 DIAGNOSIS — E46 Unspecified protein-calorie malnutrition: Secondary | ICD-10-CM | POA: Diagnosis present

## 2015-08-01 DIAGNOSIS — Z79899 Other long term (current) drug therapy: Secondary | ICD-10-CM | POA: Diagnosis not present

## 2015-08-01 DIAGNOSIS — K219 Gastro-esophageal reflux disease without esophagitis: Secondary | ICD-10-CM | POA: Diagnosis present

## 2015-08-01 DIAGNOSIS — Z823 Family history of stroke: Secondary | ICD-10-CM | POA: Diagnosis not present

## 2015-08-01 DIAGNOSIS — R32 Unspecified urinary incontinence: Secondary | ICD-10-CM | POA: Diagnosis present

## 2015-08-01 DIAGNOSIS — I480 Paroxysmal atrial fibrillation: Secondary | ICD-10-CM | POA: Diagnosis present

## 2015-08-01 DIAGNOSIS — R159 Full incontinence of feces: Secondary | ICD-10-CM | POA: Diagnosis present

## 2015-08-01 DIAGNOSIS — J9601 Acute respiratory failure with hypoxia: Secondary | ICD-10-CM | POA: Diagnosis present

## 2015-08-01 DIAGNOSIS — R1310 Dysphagia, unspecified: Secondary | ICD-10-CM | POA: Diagnosis present

## 2015-08-01 DIAGNOSIS — Z90710 Acquired absence of both cervix and uterus: Secondary | ICD-10-CM | POA: Diagnosis not present

## 2015-08-01 DIAGNOSIS — E861 Hypovolemia: Secondary | ICD-10-CM | POA: Diagnosis present

## 2015-08-01 DIAGNOSIS — E781 Pure hyperglyceridemia: Secondary | ICD-10-CM | POA: Diagnosis present

## 2015-08-01 DIAGNOSIS — I4891 Unspecified atrial fibrillation: Secondary | ICD-10-CM | POA: Diagnosis present

## 2015-08-01 DIAGNOSIS — E876 Hypokalemia: Secondary | ICD-10-CM | POA: Diagnosis present

## 2015-08-01 DIAGNOSIS — Z923 Personal history of irradiation: Secondary | ICD-10-CM | POA: Diagnosis not present

## 2015-08-01 DIAGNOSIS — R911 Solitary pulmonary nodule: Secondary | ICD-10-CM | POA: Diagnosis present

## 2015-08-01 DIAGNOSIS — Z880 Allergy status to penicillin: Secondary | ICD-10-CM | POA: Diagnosis not present

## 2015-08-01 DIAGNOSIS — G629 Polyneuropathy, unspecified: Secondary | ICD-10-CM | POA: Diagnosis present

## 2015-08-01 DIAGNOSIS — R197 Diarrhea, unspecified: Secondary | ICD-10-CM | POA: Diagnosis present

## 2015-08-01 DIAGNOSIS — Z66 Do not resuscitate: Secondary | ICD-10-CM | POA: Diagnosis present

## 2015-08-01 DIAGNOSIS — I2510 Atherosclerotic heart disease of native coronary artery without angina pectoris: Secondary | ICD-10-CM | POA: Diagnosis present

## 2015-08-01 DIAGNOSIS — Z8542 Personal history of malignant neoplasm of other parts of uterus: Secondary | ICD-10-CM | POA: Diagnosis not present

## 2015-08-01 DIAGNOSIS — M8580 Other specified disorders of bone density and structure, unspecified site: Secondary | ICD-10-CM | POA: Diagnosis present

## 2015-08-01 DIAGNOSIS — R269 Unspecified abnormalities of gait and mobility: Secondary | ICD-10-CM | POA: Diagnosis present

## 2015-08-01 DIAGNOSIS — G4700 Insomnia, unspecified: Secondary | ICD-10-CM | POA: Diagnosis present

## 2015-08-01 LAB — URINALYSIS, ROUTINE W REFLEX MICROSCOPIC
BILIRUBIN URINE: NEGATIVE
Glucose, UA: NEGATIVE mg/dL
HGB URINE DIPSTICK: NEGATIVE
KETONES UR: NEGATIVE mg/dL
Leukocytes, UA: NEGATIVE
NITRITE: NEGATIVE
Protein, ur: 30 mg/dL — AB
SPECIFIC GRAVITY, URINE: 1.012 (ref 1.005–1.030)
UROBILINOGEN UA: 0.2 mg/dL (ref 0.0–1.0)
pH: 5.5 (ref 5.0–8.0)

## 2015-08-01 LAB — LACTIC ACID, PLASMA
LACTIC ACID, VENOUS: 3.4 mmol/L — AB (ref 0.5–2.0)
LACTIC ACID, VENOUS: 2.9 mmol/L — AB (ref 0.5–2.0)

## 2015-08-01 LAB — CBC WITH DIFFERENTIAL/PLATELET
BASOS ABS: 0 10*3/uL (ref 0.0–0.1)
Basophils Relative: 0 %
EOS PCT: 0 %
Eosinophils Absolute: 0 10*3/uL (ref 0.0–0.7)
HCT: 36.6 % (ref 36.0–46.0)
HEMOGLOBIN: 12.2 g/dL (ref 12.0–15.0)
LYMPHS ABS: 0.8 10*3/uL (ref 0.7–4.0)
Lymphocytes Relative: 9 %
MCH: 30.4 pg (ref 26.0–34.0)
MCHC: 33.3 g/dL (ref 30.0–36.0)
MCV: 91.3 fL (ref 78.0–100.0)
Monocytes Absolute: 0.3 10*3/uL (ref 0.1–1.0)
Monocytes Relative: 4 %
NEUTROS PCT: 87 %
Neutro Abs: 7.4 10*3/uL (ref 1.7–7.7)
PLATELETS: 208 10*3/uL (ref 150–400)
RBC: 4.01 MIL/uL (ref 3.87–5.11)
RDW: 13 % (ref 11.5–15.5)
WBC: 8.5 10*3/uL (ref 4.0–10.5)

## 2015-08-01 LAB — TROPONIN I
TROPONIN I: 0.27 ng/mL — AB (ref <0.031)
Troponin I: 0.29 ng/mL — ABNORMAL HIGH (ref <0.031)

## 2015-08-01 LAB — MAGNESIUM
MAGNESIUM: 2.7 mg/dL — AB (ref 1.7–2.4)
Magnesium: 1.3 mg/dL — ABNORMAL LOW (ref 1.7–2.4)

## 2015-08-01 LAB — BASIC METABOLIC PANEL
ANION GAP: 10 (ref 5–15)
ANION GAP: 11 (ref 5–15)
BUN: 9 mg/dL (ref 6–20)
BUN: 9 mg/dL (ref 6–20)
CALCIUM: 7.9 mg/dL — AB (ref 8.9–10.3)
CHLORIDE: 98 mmol/L — AB (ref 101–111)
CO2: 20 mmol/L — ABNORMAL LOW (ref 22–32)
CO2: 18 mmol/L — ABNORMAL LOW (ref 22–32)
CREATININE: 0.75 mg/dL (ref 0.44–1.00)
Calcium: 7.7 mg/dL — ABNORMAL LOW (ref 8.9–10.3)
Chloride: 96 mmol/L — ABNORMAL LOW (ref 101–111)
Creatinine, Ser: 0.81 mg/dL (ref 0.44–1.00)
GFR calc non Af Amer: >60 mL/min (ref >60)
GLUCOSE: 179 mg/dL — AB (ref 65–99)
Glucose, Bld: 138 mg/dL — ABNORMAL HIGH (ref 65–99)
POTASSIUM: 3.2 mmol/L — AB (ref 3.5–5.1)
POTASSIUM: 3.9 mmol/L (ref 3.5–5.1)
SODIUM: 127 mmol/L — AB (ref 135–145)
Sodium: 126 mmol/L — ABNORMAL LOW (ref 135–145)

## 2015-08-01 LAB — URINE MICROSCOPIC-ADD ON

## 2015-08-01 LAB — I-STAT TROPONIN, ED: Troponin i, poc: 0.1 ng/mL (ref 0.00–0.08)

## 2015-08-01 LAB — CBC
HEMATOCRIT: 33.5 % — AB (ref 36.0–46.0)
HEMOGLOBIN: 11.4 g/dL — AB (ref 12.0–15.0)
MCH: 31.4 pg (ref 26.0–34.0)
MCHC: 34 g/dL (ref 30.0–36.0)
MCV: 92.3 fL (ref 78.0–100.0)
Platelets: 139 10*3/uL — ABNORMAL LOW (ref 150–400)
RBC: 3.63 MIL/uL — AB (ref 3.87–5.11)
RDW: 13.2 % (ref 11.5–15.5)
WBC: 7.9 10*3/uL (ref 4.0–10.5)

## 2015-08-01 LAB — HEPARIN LEVEL (UNFRACTIONATED)
Heparin Unfractionated: 0.34 IU/mL (ref 0.30–0.70)
Heparin Unfractionated: 0.3 IU/mL (ref 0.30–0.70)

## 2015-08-01 LAB — OSMOLALITY: OSMOLALITY: 269 mosm/kg — AB (ref 275–300)

## 2015-08-01 LAB — I-STAT CG4 LACTIC ACID, ED: LACTIC ACID, VENOUS: 3.38 mmol/L — AB (ref 0.5–2.0)

## 2015-08-01 LAB — TSH: TSH: 1.606 u[IU]/mL (ref 0.350–4.500)

## 2015-08-01 LAB — BRAIN NATRIURETIC PEPTIDE: B Natriuretic Peptide: 273.1 pg/mL — ABNORMAL HIGH (ref 0.0–100.0)

## 2015-08-01 LAB — MRSA PCR SCREENING: MRSA BY PCR: NEGATIVE

## 2015-08-01 LAB — PHOSPHORUS: PHOSPHORUS: 2.6 mg/dL (ref 2.5–4.6)

## 2015-08-01 MED ORDER — ASPIRIN EC 81 MG PO TBEC
81.0000 mg | DELAYED_RELEASE_TABLET | Freq: Every day | ORAL | Status: DC
  Administered 2015-08-01 – 2015-08-05 (×5): 81 mg via ORAL
  Filled 2015-08-01 (×5): qty 1

## 2015-08-01 MED ORDER — FAMOTIDINE 10 MG PO TABS
10.0000 mg | ORAL_TABLET | Freq: Every day | ORAL | Status: DC | PRN
  Administered 2015-08-05: 10 mg via ORAL
  Filled 2015-08-01 (×3): qty 1

## 2015-08-01 MED ORDER — LEVOTHYROXINE SODIUM 50 MCG PO TABS
50.0000 ug | ORAL_TABLET | Freq: Every day | ORAL | Status: DC
Start: 2015-08-02 — End: 2015-08-06
  Administered 2015-08-02 – 2015-08-06 (×5): 50 ug via ORAL
  Filled 2015-08-01 (×6): qty 1

## 2015-08-01 MED ORDER — DICYCLOMINE HCL 20 MG PO TABS
20.0000 mg | ORAL_TABLET | Freq: Two times a day (BID) | ORAL | Status: DC
  Administered 2015-08-01 – 2015-08-06 (×10): 20 mg via ORAL
  Filled 2015-08-01 (×12): qty 1

## 2015-08-01 MED ORDER — ACETAMINOPHEN 500 MG PO TABS
1000.0000 mg | ORAL_TABLET | Freq: Once | ORAL | Status: DC
Start: 2015-08-01 — End: 2015-08-01

## 2015-08-01 MED ORDER — SODIUM CHLORIDE 0.9 % IV BOLUS (SEPSIS): 500.0000 mL | INTRAVENOUS | Status: AC

## 2015-08-01 MED ORDER — ONDANSETRON HCL 4 MG/2ML IJ SOLN: 4.0000 mg | Freq: Four times a day (QID) | INTRAMUSCULAR | Status: DC | PRN

## 2015-08-01 MED ORDER — SODIUM CHLORIDE 0.9 % IV SOLN: 250.0000 mL | INTRAVENOUS | Status: DC | PRN

## 2015-08-01 MED ORDER — HEPARIN BOLUS VIA INFUSION
2000.0000 [IU] | Freq: Once | INTRAVENOUS | Status: AC
  Administered 2015-08-01: 2000 [IU] via INTRAVENOUS
  Filled 2015-08-01: qty 2000

## 2015-08-01 MED ORDER — SODIUM CHLORIDE 0.9 % IV BOLUS (SEPSIS)
1000.0000 mL | Freq: Once | INTRAVENOUS | Status: AC
  Administered 2015-08-01: 1000 mL via INTRAVENOUS

## 2015-08-01 MED ORDER — PANTOPRAZOLE SODIUM 40 MG PO TBEC
40.0000 mg | DELAYED_RELEASE_TABLET | Freq: Every day | ORAL | Status: DC
  Administered 2015-08-01 – 2015-08-06 (×6): 40 mg via ORAL
  Filled 2015-08-01 (×6): qty 1

## 2015-08-01 MED ORDER — CETYLPYRIDINIUM CHLORIDE 0.05 % MT LIQD
7.0000 mL | Freq: Two times a day (BID) | OROMUCOSAL | Status: DC
  Administered 2015-08-01 – 2015-08-05 (×8): 7 mL via OROMUCOSAL

## 2015-08-01 MED ORDER — VANCOMYCIN HCL IN DEXTROSE 1-5 GM/200ML-% IV SOLN
1000.0000 mg | Freq: Once | INTRAVENOUS | Status: AC
  Administered 2015-08-01: 1000 mg via INTRAVENOUS
  Filled 2015-08-01: qty 200

## 2015-08-01 MED ORDER — HEPARIN (PORCINE) IN NACL 100-0.45 UNIT/ML-% IJ SOLN
900.0000 [IU]/h | INTRAMUSCULAR | Status: DC
  Administered 2015-08-01: 700 [IU]/h via INTRAVENOUS
  Administered 2015-08-02 – 2015-08-03 (×2): 800 [IU]/h via INTRAVENOUS
  Administered 2015-08-05: 900 [IU]/h via INTRAVENOUS
  Filled 2015-08-01 (×4): qty 250

## 2015-08-01 MED ORDER — LEVOTHYROXINE SODIUM 50 MCG PO TABS
25.0000 ug | ORAL_TABLET | Freq: Once | ORAL | Status: AC
  Administered 2015-08-01: 25 ug via ORAL
  Filled 2015-08-01: qty 1

## 2015-08-01 MED ORDER — NOREPINEPHRINE BITARTRATE 1 MG/ML IV SOLN
2.0000 ug/min | INTRAVENOUS | Status: DC
  Filled 2015-08-01: qty 4

## 2015-08-01 MED ORDER — LOPERAMIDE HCL 2 MG PO CAPS
2.0000 mg | ORAL_CAPSULE | Freq: Every day | ORAL | Status: DC | PRN
  Administered 2015-08-01: 2 mg via ORAL
  Administered 2015-08-01: 4 mg via ORAL
  Administered 2015-08-01 – 2015-08-05 (×22): 2 mg via ORAL
  Filled 2015-08-01 (×23): qty 1

## 2015-08-01 MED ORDER — ASPIRIN 325 MG PO TABS
325.0000 mg | ORAL_TABLET | Freq: Once | ORAL | Status: AC
  Administered 2015-08-01: 325 mg via ORAL
  Filled 2015-08-01: qty 1

## 2015-08-01 MED ORDER — NITROGLYCERIN 0.4 MG SL SUBL: 0.4000 mg | SUBLINGUAL_TABLET | SUBLINGUAL | Status: DC | PRN

## 2015-08-01 MED ORDER — ACETAMINOPHEN 650 MG RE SUPP
975.0000 mg | Freq: Once | RECTAL | Status: AC
  Administered 2015-08-01: 975 mg via RECTAL
  Filled 2015-08-01 (×2): qty 1

## 2015-08-01 MED ORDER — SODIUM CHLORIDE 0.9 % IV SOLN
INTRAVENOUS | Status: DC
Start: 2015-08-01 — End: 2015-08-01

## 2015-08-01 MED ORDER — DEXTROSE 5 % IV SOLN
1.0000 g | Freq: Once | INTRAVENOUS | Status: DC
  Filled 2015-08-01: qty 1

## 2015-08-01 MED ORDER — RALOXIFENE HCL 60 MG PO TABS
60.0000 mg | ORAL_TABLET | Freq: Every day | ORAL | Status: DC
  Administered 2015-08-01 – 2015-08-06 (×6): 60 mg via ORAL
  Filled 2015-08-01 (×6): qty 1

## 2015-08-01 MED ORDER — DEXTROSE 5 % IV SOLN
2.0000 g | INTRAVENOUS | Status: DC
  Administered 2015-08-01 – 2015-08-03 (×3): 2 g via INTRAVENOUS
  Filled 2015-08-01 (×4): qty 2

## 2015-08-01 MED ORDER — MAGNESIUM SULFATE 2 GM/50ML IV SOLN
2.0000 g | Freq: Once | INTRAVENOUS | Status: AC
  Administered 2015-08-01: 2 g via INTRAVENOUS
  Filled 2015-08-01: qty 50

## 2015-08-01 MED ORDER — POTASSIUM CHLORIDE CRYS ER 20 MEQ PO TBCR
40.0000 meq | EXTENDED_RELEASE_TABLET | Freq: Once | ORAL | Status: AC
  Administered 2015-08-01: 40 meq via ORAL
  Filled 2015-08-01: qty 2

## 2015-08-01 MED ORDER — DEXTROSE 5 % IV SOLN
500.0000 mg | INTRAVENOUS | Status: DC
  Administered 2015-08-01 – 2015-08-02 (×2): 500 mg via INTRAVENOUS
  Filled 2015-08-01 (×5): qty 500

## 2015-08-01 MED ORDER — ADULT MULTIVITAMIN W/MINERALS CH
1.0000 | ORAL_TABLET | Freq: Every day | ORAL | Status: DC
  Administered 2015-08-01 – 2015-08-06 (×6): 1 via ORAL
  Filled 2015-08-01 (×6): qty 1

## 2015-08-01 MED ORDER — HEPARIN SODIUM (PORCINE) 5000 UNIT/ML IJ SOLN: 5000.0000 [IU] | Freq: Three times a day (TID) | INTRAMUSCULAR | Status: DC

## 2015-08-01 MED ORDER — LEVOTHYROXINE SODIUM 25 MCG PO TABS
25.0000 ug | ORAL_TABLET | Freq: Every day | ORAL | Status: DC
  Administered 2015-08-01: 25 ug via ORAL
  Filled 2015-08-01 (×2): qty 1

## 2015-08-01 MED ORDER — LEVOFLOXACIN IN D5W 750 MG/150ML IV SOLN
750.0000 mg | Freq: Once | INTRAVENOUS | Status: AC
  Administered 2015-08-01: 750 mg via INTRAVENOUS
  Filled 2015-08-01: qty 150

## 2015-08-01 NOTE — Progress Notes (Signed)
Lab called with critical LA 3.4.  Result called to CCM.  No new  orders at this time.

## 2015-08-01 NOTE — ED Notes (Signed)
Respiratory at bedside to apply Bipap. 

## 2015-08-01 NOTE — Progress Notes (Signed)
Info regarding pt pulled from 06/2015 Notes.  Past Medical History: HTN.   EF 30-40%.  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 - 06/20/15 PNA  04/2008 Prevnar 06/18/14 Tdap  01/26/14 Zostavax  04/06/06  ECHO 05/2007  ECHO.  EF 30-40%. No Change form 2007 Carotids 04/2010 Carotid Artery Duplex. <50% B Carotid Stenosis. vert Antegrade.  CAD--  11/12/14 CT showed Scattered coronary artery calcification.  Known LBBBB ECHO 05/2007 function was EF 30-40%.   On ASA.  LDL 83 (06/12/2015 10:58:00 AM) LDL 74 (06/11/2014 2:51:00 PM)   Dysphagia, unspecified She had UGI in fall 2015.  Occ eats too fast and gets "lump in chest" small bited and fluids c meals   Protein malnutrition- She is underwieght and has malabsorption. She is doing well for her. She has gained some weight back BMI:  19.31 (06/20/2015 11:18:51 AM), weight: 109 (06/20/2015 11:18:51 AM), height: 63 (06/20/2015 11:18:51 AM) weight: 110.38 (12/16/2014 10:53:55 AM) weight: 109 (06/18/2014 2:23:01 PM)   Insomnia  Abnormality of gait  Uses Hand brake rolling walker well. gets overbalanced and no recent falls.   CARDIOMYOPATHY Last ECHO 05/2007 function was EF 30-40%.   no Sxs. No need to repeat ECHO when stable. On ACEi.  Chronic OA and Hip issues.   CAROTID ARTERY DISEASE  Carotids 04/2010 Carotid Artery Duplex. <50% B Carotid Stenosis. vert Antegrade.  B12 DEFICIENCY on shots -  Rx  Microalbuminuria 50 in 2015 - 51.4 06/2015   CKD stage II (mild) GFR 60-89 (ICD-585.2) (ICD10-N18.2) Microalb 50's . Cr: 0.7 (06/12/2015) Cr Cl  78.4  VERTIGO meclizine 12/5mg  1/2 to 1 every 6 hrs as needed, caution on sedation, mostly at hs Occ vertigo if gets up too quickly     URINARY INCONTINENCE, MIXEDLots of incont HS. Some Dysuria. (-) UA  Chronic Hyponatremia  131 06/2015  OSTEOPOROSIS - Recent DEXA done  Evista 60 Mg Tabs (Raloxifene hcl) .Marland Kitchen... 1 tab daily    Vitamin D (ergocalciferol) 50000 Unit Caps (Ergocalciferol) .Marland Kitchen... Take one tablet 4 days per week   NEUROPATHY Neuropathy from B12 Def and Low back Saw Dr Jannifer Franklin 06/04/13 for Peripheral neuropathy and  Gait disturbance Continue walker. The patient has assistance in the home environment. The patient is not having discomfort with the neuropathy, and there is no indication for medical therapy. The Neuropathy has not progressed. The patient is no longer operating a motor vehicle.     LUNG NODULE Lung nodule - See CT Scan 12/09/08. She and I were comfortable not repeating CT scan again and besides if she had lung Cancer she would probably not be here. Last CXR was (-)   LBBB ECHO 05/2007  ECHO.  EF 30-40%. No Change form 2007. EKG 10/2013 was without change. She is stable.   Hypertensive cardiomyopathy    HYPOTHYROIDISM on Rx    Synthroid 50 Mcg Tabs (Levothyroxine sodium) .Marland Kitchen... 1 tab daily TSH: 3.72 (06/12/2015)   Total T3: 80.5 (12/16/2014) - #s fine. Chol: 165 (06/12/2015)   LDL: 83 (06/12/2015)  DIARRHEA, CHRONIC Her updated medication list for this problem includes:    Meclizine Hcl 12.5 Mg Oral Tabs (Meclizine hcl) .Marland Kitchen... 1/2 to 1 every 6 hrs as needed for dizziness, may take one at bedtime    Loperamide Hcl 2 Mg Oral Caps (Loperamide hcl) .Marland Kitchen... Take 1 capsule by mouth up to 8 times a day as needed for diarrhea  Dr Cristina Gong prn Short bowel syndrome: Chronic diarrhea on Immodium - off the  cholestyramine She is doing the best she can on current meds. Titrates the Imodium u and down as needed She has to be careful to try and regulate and may leave off Loperimide prn    HYPERTRIGLYCERIDEMIA Total 165 (06/12/2015 10:58:00 AM), Trig 121 (06/12/2015 10:58:00 AM) HDL 58 MG/DL (06/12/2015 10:58:00 AM) LDL 83 (06/12/2015 10:58:00 AM)  Total 158 (06/11/2014 2:51:00 PM), Trig 193 (06/11/2014 2:51:00 PM) HDL 45 MG/DL (06/11/2014 2:51:00 PM) LDL 74 (06/11/2014 2:51:00 PM)  Off Cholestyramine  TG 121-235 due to short gut and doing well currently s issues     Medications Added to Medication List This Visit: 1)  Loperamide Hcl 2 Mg Oral Caps (Loperamide hcl) .... Take 1 capsule by mouth up to 8 times a day as needed for diarrhea  Complete Medication List: 1)  Prilosec 40 Mg Oral Cpdr (Omeprazole) .... Take one tablet by mouth daily 29minutes prior to meal 2)  Vitamin D3 1000 Unit Oral Caps (Cholecalciferol) .Marland Kitchen.. 1 po qd 3)  Juice Plus Fibre Oral Liqd (Nutritional supplements) .... Qd ad 4)  Meclizine Hcl 12.5 Mg Oral Tabs (Meclizine hcl) .... 1/2 to 1 every 6 hrs as needed for dizziness, may take one at bedtime 5)  Aspirin 81 Mg Ec Tab (Aspirin) .... Take one (1) tablet by mouth daily 6)  Cyanocobalamin 1000 Mcg/ml Inj Soln (Cyanocobalamin) .Marland Kitchen.. 1045mcg im every month 7)  Caltrate 600+d Tabs (Calcium carbonate-vitamin d tabs) .... Take one tablet by mouth twice daily 8)  Evista 60 Mg Tabs (Raloxifene hcl) .Marland Kitchen.. 1 tab daily 9)  Loperamide Hcl 2 Mg Oral Caps (Loperamide hcl) .... Take 1 capsule by mouth up to 8 times a day as needed for diarrhea 10)  Lisinopril 10 Mg Tabs (Lisinopril) .Marland Kitchen.. 1 tab daily 11)  Multivitamins Tabs (Multiple vitamin) .... Take one tablet by mouth every day 12)  Synthroid 50 Mcg Tabs (Levothyroxine sodium) .Marland Kitchen.. 1 tab daily 13)  Vitamin D (ergocalciferol) 50000 Unit Caps (Ergocalciferol) .... Take one tablet 4 days per week

## 2015-08-01 NOTE — ED Notes (Signed)
Per EMS:  Pt called EMS due to SOB x 2 hours.  Denies CP.  Pt was breathing heavily upon EMS arrival.  Given 10 albuterol and 0.5 atrovent at home.  Pt found to be in A-fib with RVR w/ HR of 180+.  Pt given Adenosine x 2 (6 and 12) and 10mg  Cardizem IV.  Pt given 450 cc NaCl.  BP dropped from 248 systolic to 185/90.

## 2015-08-01 NOTE — ED Notes (Signed)
Md at bedside

## 2015-08-01 NOTE — Progress Notes (Signed)
ANTICOAGULATION CONSULT NOTE - Initial Consult  Pharmacy Consult for heparin Indication: atrial fibrillation  Allergies  Allergen Reactions  . Codeine Itching  . Penicillins Swelling    Patient Measurements: Height: 5\' 5"  (165.1 cm) Weight: 109 lb (49.442 kg) IBW/kg (Calculated) : 57  Vital Signs: Temp: 101.8 F (38.8 C) (10/21 0121) Temp Source: Rectal (10/21 0121) BP: 98/49 mmHg (10/21 0245) Pulse Rate: 100 (10/21 0245)  Labs:  Recent Labs  08/01/15 0008  HGB 12.2  HCT 36.6  PLT 208  CREATININE 0.81    Estimated Creatinine Clearance: 35.3 mL/min (by C-G formula based on Cr of 0.81).   Medical History: Past Medical History  Diagnosis Date  . Peripheral neuropathy ([place])   . Hypothyroidism   . Depression   . Uterine cancer ([place])   . Status post radiation therapy   . S/P small bowel resection     with Diarrhea  . Left carpal tunnel syndrome     By nerve conduction study  . Vitamin D deficiency   . Depression   . Insomnia      Assessment: 81yo female c/o SOB, being tx'd for CAP, also found to be in new-onset Afib w/ RVR, to begin heparin.  Goal of Therapy:  Heparin level 0.3-0.7 units/ml Monitor platelets by anticoagulation protocol: Yes   Plan:  Will give heparin 2000 units x1 followed by gtt at 700 units/hr and monitor heparin levels and CBC.  Wynona Neat, PharmD, BCPS  08/01/2015,3:07 AM

## 2015-08-01 NOTE — H&P (Signed)
Name: Teresa Gonzalez MRN: [id number] DOB: 03/22/1922    ADMISSION DATE:  07/31/2015 CONSULTATION DATE:  08/01/15  REFERRING MD :  EDP  CHIEF COMPLAINT:  SOB  BRIEF PATIENT DESCRIPTION: 81 y.o. F brought to [facility] ED 10/21 for SOB.  Found to have CAP and was started on BiPAP for hypoxia and increased work of breathing.  PCCM called for admission.  SIGNIFICANT EVENTS  10/21 - admitted  STUDIES:  CXR 10/21 >>> RUL infiltrate c/w CAP.  Mild left basilar opacity likely also reflects PNA.  HISTORY OF PRESENT ILLNESS:  Teresa Gonzalez is a 81 y.o. F with PMH as outlined below.  She was brought to [facility] ED during early AM hours 10/21 due to SOB and "just feeling bad".  She apparently has "felt bad" for the past few days with no specific complaint.  On night of 10/20 while trying to go to sleep, she began to experience SOB and palpitations.  EMS was dispatched and on their arrival she was found to be in A.fib with RVR (HR in 180's).  She was given albuterol, atrovent, adenosine x 2, 10mg  Cardizem, and 450cc NS bolus.  On ED arrival, she was hypotensive; however, this responded well to IVF's.  She was placed on BiPAP due to hypoxia and increased WOB.  Per pt, BiPAP has helped relieve her sensation of SOB.  CXR was obtained and revealed RUL infiltrate.  She had 1 documented rectal temp of 101.67F and initial lactate was elevated at 3.38.  PCCM was called for admission.  She has not had any fevers/chills/sweats, chest pain, cough, wheezing, N/V/D, abdominal pain, myalgias.  No recent travel or exposure to known sick contacts.  Her sister does live in a nursing home and she does visit her, but she had not had any overnight stays in health care facility's and has not been hospitalized recently.  At the time of my assessment, I feel that she can come off of BiPAP; however, she asks if she can continue it until morning given the fact that she feels more comfortable with it and state her SOB has improved with it.  PAST  MEDICAL HISTORY :   has a past medical history of Peripheral neuropathy ([place]); Hypothyroidism; Depression; Uterine cancer ([place]); Status post radiation therapy; S/P small bowel resection; Left carpal tunnel syndrome; Vitamin D deficiency; Depression; and Insomnia.  has past surgical history that includes Cataract extraction (Bilateral) and Abdominal hysterectomy. Prior to Admission medications   Medication Sig Start Date End Date Taking? Authorizing Provider  aspirin 81 MG EC tablet Take 81 mg by mouth at bedtime. Swallow whole.    Historical Provider, MD  Calcium Carbonate (CALTRATE 600 PO) Take 600 mg by mouth daily.     Historical Provider, MD  dicyclomine (BENTYL) 20 MG tablet Take 1 tablet (20 mg total) by mouth 2 (two) times daily. 11/12/14   Orpah Greek, MD  famotidine-calcium carbonate-magnesium hydroxide (ACID CONTROLLER COMPLETE) 10-800-165 MG CHEW chewable tablet Chew 1 tablet by mouth daily as needed (heartburn).     Historical Provider, MD  levothyroxine (SYNTHROID, LEVOTHROID) 25 MCG tablet Take 25 mcg by mouth daily.    Historical Provider, MD  lisinopril (PRINIVIL,ZESTRIL) 10 MG tablet Take 10 mg by mouth daily.    Historical Provider, MD  loperamide (IMODIUM) 2 MG capsule Take 1 capsule (2 mg total) by mouth 5 (five) times daily as needed for diarrhea or loose stools. 10/23/13   Shon Baton, MD  Multiple Vitamins-Minerals (MULTIVITAMIN WITH MINERALS)  tablet Take 1 tablet by mouth daily.    Historical Provider, MD  NON FORMULARY Take 1 capsule by mouth daily. Juice Plus. Food Supplement.    Historical Provider, MD  omeprazole (PRILOSEC) 40 MG capsule Take 40 mg by mouth daily.    Historical Provider, MD  ondansetron (ZOFRAN) 4 MG tablet Take 1 tablet (4 mg total) by mouth every 6 (six) hours. 11/12/14   Orpah Greek, MD  raloxifene (EVISTA) 60 MG tablet Take 60 mg by mouth daily.    Historical Provider, MD  VITAMIN B1-B12 IJ Inject 1 mL into the skin every 21 (  twenty-one) days.     Historical Provider, MD  Vitamin D, Ergocalciferol, (DRISDOL) 50000 UNITS CAPS Take 50,000 Units by mouth every 7 (seven) days.     Historical Provider, MD   Allergies  Allergen Reactions  . Codeine Itching  . Penicillins Swelling    FAMILY HISTORY:  family history includes Aneurysm in her father; Diabetes in her brother; Stroke in her mother. SOCIAL HISTORY:  reports that she has never smoked. She has never used smokeless tobacco. She reports that she does not drink alcohol or use illicit drugs.  REVIEW OF SYSTEMS:   All negative; except for those that are bolded, which indicate positives.  Constitutional: weight loss, weight gain, night sweats, fevers, chills, fatigue, weakness, generalized ill feeling. HEENT: headaches, sore throat, sneezing, nasal congestion, post nasal drip, difficulty swallowing, tooth/dental problems, visual complaints, visual changes, ear aches. Neuro: difficulty with speech, weakness, numbness, ataxia. CV:  chest pain, orthopnea, PND, swelling in lower extremities, dizziness, palpitations, syncope.  Resp: cough, hemoptysis, dyspnea, wheezing. GI  heartburn, indigestion, abdominal pain, nausea, vomiting, diarrhea, constipation, change in bowel habits, loss of appetite, hematemesis, melena, hematochezia, watery stools (chronic). GU: dysuria, change in color of urine, urgency or frequency, flank pain, hematuria. MSK: joint pain or swelling, decreased range of motion. Psych: change in mood or affect, depression, anxiety, suicidal ideations, homicidal ideations. Skin: rash, itching, bruising.   SUBJECTIVE:  SOB is improved since arrival to ED.  She denies any chest pain, cough, N/V/D, abd pain.  VITAL SIGNS: Temp:  [99.1 F (37.3 C)-101.8 F (38.8 C)] 101.8 F (38.8 C) (10/21 0121) Pulse Rate:  [102-153] 102 (10/21 0215) Resp:  [16-30] 17 (10/21 0215) BP: (95-112)/(46-79) 105/58 mmHg (10/21 0215) SpO2:  [85 %-99 %] 99 % (10/21  0215) FiO2 (%):  [40 %] 40 % (10/21 0032) Weight:  [49.442 kg (109 lb)] 49.442 kg (109 lb) (10/21 0032)  PHYSICAL EXAMINATION: General: Elderly female, resting in bed, in NAD. Neuro: A&O x 3, non-focal.  HEENT: [place]/AT. PERRL, arcus senilis.  BiPAP in place. Cardiovascular: IRIR, no M/R/G.  Lungs: Respirations even and unlabored.  Faint basilar rhonchi R > L. Abdomen: Old abdominal midline incision noted.  BS x 4, soft, NT/ND.  Musculoskeletal: No gross deformities, no edema.  Skin: Intact, warm, no rashes.    Recent Labs Lab 08/01/15 0008  NA 126*  K 3.2*  CL 96*  CO2 20*  BUN 9  CREATININE 0.81  GLUCOSE 179*    Recent Labs Lab 08/01/15 0008  HGB 12.2  HCT 36.6  WBC 8.5  PLT 208   Dg Chest Port 1 View  08/01/2015  CLINICAL DATA:  Acute onset of shortness of breath and generalized chest pain. Initial encounter. EXAM: PORTABLE CHEST 1 VIEW COMPARISON:  Chest radiograph performed 10/20/2013 FINDINGS: The lungs are well-aerated. Right upper lobe airspace opacity is compatible with pneumonia. Mild left basilar  airspace opacity is also seen. There is no evidence of pleural effusion or pneumothorax. The cardiomediastinal silhouette is borderline normal in size. No acute osseous abnormalities are seen. IMPRESSION: Right upper lobe pneumonia noted. Mild left basilar airspace opacity likely also reflects pneumonia. Followup PA and lateral chest X-ray is recommended in 3-4 weeks following trial of antibiotic therapy, as deemed clinically appropriate, to ensure resolution and exclude underlying malignancy. Electronically Signed   By: Garald Balding M.D.   On: 08/01/2015 00:34    ASSESSMENT / PLAN:  CAP - bilateral infiltrates, R > L. Acute hypoxic respiratory failure - due to above. Plan: Empiric abx (ceftriaxone, azithromycin). Sputum cultures, blood cultures. Check PCT. Trend lactate. Continue BiPAP as needed. Supplemental O2 as needed when not on BiPAP. F/u CXR in AM. Would  get f/u CXR with PCP in 1 month to ensure resolution of infiltrate (she has hx uterine CA s/p radiation).  A.fib - appears new; could be compensatory due to infectious process vs electrolyte derangements. Troponin leak - suspect demand ischemia. LBB - old. Plan: Heparin gtt for now. Defer rate controlling agents for now given that she had labile BP after EMS administered cardizem. Trend troponins.  Hyponatremia - likely hypovolemic. Hypokalemia. NAG metabolic acidosis. Hypomagnesemia - s/p 2g replacement in ED. ? Hypocalcemia - no albumin to correct for. Plan: NS @ 75. Check serum osmoles. 43mEq K PO. Check ionized calcium. BMP in AM.  Hypothyroidism. Plan: Continue outpatient synthroid. Check TSH.  GERD. ? Irritable bowel with fecal incontinence. Plan: Continue outpatient PPI, bentyl, loperamide.  Osteoporosis. Plan: Continue outpatient raloxifene.  Best Practice: Code Status:  DNR / DNI. VTE Prophylaxis:  SCD's / Heparin gtt. Diet:  NPO for now, diet in AM.   Montey Hora, PA - [facility] Pager: [phone]  or [phone] 08/01/2015, 2:41 AM  Attending Note:  81 year old female with PMH above presenting with SOB to the ED.  Was noted to have a CAP and had increased WOB.  BiPAP was started by EDP and PCCM was called to admit.  On physical exam continues to have increased WOB but improved from admission.  R>L crackles on exam.  I reviewed CXR myself and RUL infiltrate noted, likely PNA.  Discussed with PCCM-NP and bedside RN and TRH-Hospitalist.  CAP - bilateral infiltrates, R > L. Acute hypoxic respiratory failure - due to above. Plan: Continue rocephin and zithromax. F/U on culture. PCT pending. Change BiPAP to PRN, tolerating [employer] well at this point. Titrate O2 for sat of 88-92%. Would get f/u CXR with PCP in 1 month to ensure resolution of infiltrate (she has hx uterine CA s/p radiation).  A.fib - appears new;  could be compensatory due to infectious process vs electrolyte derangements. Troponin leak - suspect demand ischemia. LBB - old. Plan: Heparin for a-fib. Will not continue home rate control medications, has been stable since has been in the hospital. Troponin 0.27, likely demand ischemia, if f/u is increasing now that the patient is stable will consider calling cards.  Hyponatremia - likely hypovolemic. Hypokalemia. NAG metabolic acidosis. Hypomagnesemia - s/p 2g replacement in ED. ? Hypocalcemia - no albumin to correct for. Plan: KVO IVF at this point to avoid fluid overload. Check serum osmoles. K-dur 40 mg PO x1. Replace electrolytes as indicated. BMP in AM.  Hypothyroidism. Plan: Continue outpatient synthroid (increase to 50 from 25, evidently that is the home dose). TSH 1.606.  GERD. ? Irritable  bowel with fecal incontinence. Plan: Continue outpatient PPI, bentyl, loperamide. Heart healthy diet.  Osteoporosis. Plan: Continue outpatient raloxifene.  Patient is DNR.  Hold for observation in SDU but transfer care to [facility] with PCCM off 10/22.  Patient seen and examined, agree with above note.  I dictated the care and orders written for this patient under my direction.  Rush Farmer, MD [phone]

## 2015-08-01 NOTE — Progress Notes (Signed)
ANTICOAGULATION CONSULT NOTE - Follow Up Consult  Pharmacy Consult for heparin Indication: atrial fibrillation   Patient Measurements: Height: 5\' 5"  (165.1 cm) Weight: 112 lb 3.4 oz (50.9 kg) IBW/kg (Calculated) : 57   Vital Signs: Temp: 98.5 F (36.9 C) (10/21 0700) Temp Source: Oral (10/21 0700) BP: 129/68 mmHg (10/21 0900) Pulse Rate: 86 (10/21 0900)  Labs:  Recent Labs  08/01/15 0008 08/01/15 0445 08/01/15 1128  HGB 12.2 11.4*  --   HCT 36.6 33.5*  --   PLT 208 139*  --   HEPARINUNFRC  --   --  0.34  CREATININE 0.81 0.75  --   TROPONINI  --  0.27*  --     Estimated Creatinine Clearance: 36.8 mL/min (by C-G formula based on Cr of 0.75).   Medications:  Scheduled:  . antiseptic oral rinse  7 mL Mouth Rinse BID  . aspirin EC  81 mg Oral QHS  . [START ON 08/02/2015] azithromycin  500 mg Intravenous Q24H  . cefTRIAXone (ROCEPHIN)  IV  2 g Intravenous Q24H  . dicyclomine  20 mg Oral BID  . levothyroxine  25 mcg Oral QAC breakfast  . multivitamin with minerals  1 tablet Oral Daily  . pantoprazole  40 mg Oral Daily  . raloxifene  60 mg Oral Daily   Infusions:  . sodium chloride 500 mL (08/01/15 0353)  . heparin 700 Units/hr (08/01/15 0352)    Assessment: 81yo female c/o SOB, found to be in new-onset Afib w/ RVR (CHADSVASC= 5; HTN, age, gender, HF) on heparin. Heparin is at 700 units/hr and initial level is at goal (HL= 0.34)  Goal of Therapy:  Heparin level 0.3-0.7 units/ml Monitor platelets by anticoagulation protocol: Yes   Plan:  -No heparin changes needed -Will follow anticoagulation plans  Hildred Laser, Pharm D 08/01/2015 12:00 PM

## 2015-08-01 NOTE — Progress Notes (Signed)
ANTIBIOTIC CONSULT NOTE - INITIAL  Pharmacy Consult for Rocephin and Zithromax Indication: CAP   Labs:  Recent Labs  08/01/15 0008  WBC 8.5  HGB 12.2  PLT 208  CREATININE 0.81   Estimated Creatinine Clearance: 35.3 mL/min (by C-G formula based on Cr of 0.81).   Assessment/Plan:  81yo female c/o SOB, found to be in Afib w/ RVR and CXR shows PNA, code sepsis called in ED, to begin IV ABX.  Will start Rocephin 2g IV Q24H and azithromycin 500mg  IV Q24H and monitor CBC and Cx.  Wynona Neat, PharmD, BCPS  08/01/2015,2:38 AM

## 2015-08-01 NOTE — Progress Notes (Signed)
ANTICOAGULATION CONSULT NOTE - Follow Up Consult  Pharmacy Consult for heparin Indication: atrial fibrillation   Patient Measurements: Height: 5\' 5"  (165.1 cm) Weight: 112 lb 3.4 oz (50.9 kg) IBW/kg (Calculated) : 57   Vital Signs: Temp: 97.5 F (36.4 C) (10/21 1954) Temp Source: Axillary (10/21 1954) BP: 160/76 mmHg (10/21 2000) Pulse Rate: 81 (10/21 2000)  Labs:  Recent Labs  08/01/15 0008 08/01/15 0445 08/01/15 1128 08/01/15 2015  HGB 12.2 11.4*  --   --   HCT 36.6 33.5*  --   --   PLT 208 139*  --   --   HEPARINUNFRC  --   --  0.34 0.30  CREATININE 0.81 0.75  --   --   TROPONINI  --  0.27* 0.29*  --     Estimated Creatinine Clearance: 36.8 mL/min (by C-G formula based on Cr of 0.75).   Medications:  Scheduled:  . antiseptic oral rinse  7 mL Mouth Rinse BID  . aspirin EC  81 mg Oral QHS  . [START ON 08/02/2015] azithromycin  500 mg Intravenous Q24H  . cefTRIAXone (ROCEPHIN)  IV  2 g Intravenous Q24H  . dicyclomine  20 mg Oral BID  . [START ON 08/02/2015] levothyroxine  50 mcg Oral QAC breakfast  . multivitamin with minerals  1 tablet Oral Daily  . pantoprazole  40 mg Oral Daily  . raloxifene  60 mg Oral Daily   Infusions:  . heparin 700 Units/hr (08/01/15 2000)    Assessment: 81yo female c/o SOB, found to be in new-onset Afib w/ RVR (CHADSVASC= 5; HTN, age, gender, HF) on heparin. Heparin is at 700 units/hr and initial level is at goal (HL= 0.34)  Confirmatory HL 0.30, slightly increase Heparin IV to 800units/hr.   Goal of Therapy:  Heparin level 0.3-0.7 units/ml Monitor platelets by anticoagulation protocol: Yes   Plan:  -Increase heparin IV to 800units/hr -Daily HL/CBC -Will follow anticoagulation plans  Jireh Elmore C. Lennox Grumbles, PharmD Pharmacy Resident  Pager: [phone] 08/01/2015 8:52 PM

## 2015-08-01 NOTE — ED Notes (Signed)
Pt saturations on RA at 85%.  Pt placed on 2L [place].  Pt saturations around 91%.  Pt bumped up to 4L per MD.

## 2015-08-01 NOTE — ED Notes (Signed)
Pt called out stating she was having some itching in her left arm.  Red hives noted above the wrist to elbow.  Levaquin stopped and MD made aware.

## 2015-08-02 ENCOUNTER — Inpatient Hospital Stay (HOSPITAL_COMMUNITY): Payer: Commercial Managed Care - HMO

## 2015-08-02 DIAGNOSIS — J9601 Acute respiratory failure with hypoxia: Secondary | ICD-10-CM | POA: Diagnosis present

## 2015-08-02 DIAGNOSIS — E038 Other specified hypothyroidism: Secondary | ICD-10-CM

## 2015-08-02 DIAGNOSIS — E876 Hypokalemia: Secondary | ICD-10-CM | POA: Diagnosis present

## 2015-08-02 DIAGNOSIS — I4891 Unspecified atrial fibrillation: Secondary | ICD-10-CM | POA: Diagnosis present

## 2015-08-02 DIAGNOSIS — M810 Age-related osteoporosis without current pathological fracture: Secondary | ICD-10-CM | POA: Diagnosis present

## 2015-08-02 DIAGNOSIS — E871 Hypo-osmolality and hyponatremia: Secondary | ICD-10-CM | POA: Diagnosis present

## 2015-08-02 DIAGNOSIS — E039 Hypothyroidism, unspecified: Secondary | ICD-10-CM | POA: Diagnosis present

## 2015-08-02 LAB — URINE CULTURE

## 2015-08-02 LAB — CBC
HEMATOCRIT: 33.4 % — AB (ref 36.0–46.0)
Hemoglobin: 11.2 g/dL — ABNORMAL LOW (ref 12.0–15.0)
MCH: 30.7 pg (ref 26.0–34.0)
MCHC: 33.5 g/dL (ref 30.0–36.0)
MCV: 91.5 fL (ref 78.0–100.0)
Platelets: 195 10*3/uL (ref 150–400)
RBC: 3.65 MIL/uL — ABNORMAL LOW (ref 3.87–5.11)
RDW: 13.4 % (ref 11.5–15.5)
WBC: 5.8 10*3/uL (ref 4.0–10.5)

## 2015-08-02 LAB — BASIC METABOLIC PANEL
Anion gap: 7 (ref 5–15)
BUN: 7 mg/dL (ref 6–20)
CALCIUM: 8.1 mg/dL — AB (ref 8.9–10.3)
CO2: 21 mmol/L — AB (ref 22–32)
CREATININE: 0.5 mg/dL (ref 0.44–1.00)
Chloride: 101 mmol/L (ref 101–111)
GFR calc Af Amer: >60 mL/min (ref >60)
GFR calc non Af Amer: >60 mL/min (ref >60)
GLUCOSE: 100 mg/dL — AB (ref 65–99)
Potassium: 4.2 mmol/L (ref 3.5–5.1)
Sodium: 129 mmol/L — ABNORMAL LOW (ref 135–145)

## 2015-08-02 LAB — LACTIC ACID, PLASMA
Lactic Acid, Venous: 2.3 mmol/L (ref 0.5–2.0)
Lactic Acid, Venous: 4.1 mmol/L (ref 0.5–2.0)

## 2015-08-02 LAB — HEPARIN LEVEL (UNFRACTIONATED): Heparin Unfractionated: 0.34 IU/mL (ref 0.30–0.70)

## 2015-08-02 LAB — TROPONIN I: TROPONIN I: 0.1 ng/mL — AB (ref <0.031)

## 2015-08-02 LAB — PHOSPHORUS: Phosphorus: 2.5 mg/dL (ref 2.5–4.6)

## 2015-08-02 LAB — MAGNESIUM: Magnesium: 1.8 mg/dL (ref 1.7–2.4)

## 2015-08-02 MED ORDER — SODIUM CHLORIDE 0.9 % IV SOLN
INTRAVENOUS | Status: DC
  Administered 2015-08-02 – 2015-08-04 (×3): via INTRAVENOUS

## 2015-08-02 MED ORDER — METHYLPREDNISOLONE SODIUM SUCC 125 MG IJ SOLR
60.0000 mg | INTRAMUSCULAR | Status: DC
  Administered 2015-08-02 – 2015-08-03 (×2): 60 mg via INTRAVENOUS
  Filled 2015-08-02 (×2): qty 2

## 2015-08-02 MED ORDER — SODIUM CHLORIDE 0.9 % IV BOLUS (SEPSIS)
1000.0000 mL | Freq: Once | INTRAVENOUS | Status: AC
  Administered 2015-08-02: 1000 mL via INTRAVENOUS

## 2015-08-02 MED ORDER — ALPRAZOLAM 0.25 MG PO TABS
0.2500 mg | ORAL_TABLET | Freq: Once | ORAL | Status: AC
  Administered 2015-08-02: 0.25 mg via ORAL
  Filled 2015-08-02: qty 1

## 2015-08-02 MED ORDER — DM-GUAIFENESIN ER 30-600 MG PO TB12
1.0000 | ORAL_TABLET | Freq: Two times a day (BID) | ORAL | Status: DC
  Administered 2015-08-02 – 2015-08-06 (×8): 1 via ORAL
  Filled 2015-08-02 (×8): qty 1

## 2015-08-02 MED ORDER — LEVALBUTEROL HCL 1.25 MG/0.5ML IN NEBU
1.2500 mg | INHALATION_SOLUTION | Freq: Four times a day (QID) | RESPIRATORY_TRACT | Status: DC
  Administered 2015-08-02: 1.25 mg via RESPIRATORY_TRACT
  Filled 2015-08-02: qty 0.5

## 2015-08-02 NOTE — Progress Notes (Signed)
CRITICAL VALUE ALERT  Critical value received:  Lactic Acid 4.1  Date of notification:  08/02/2015  Time of notification:  2007  Critical value read back:Yes.    Nurse who received alert:  Rosebud Poles RN  MD notified (1st page):  Raliegh Ip Schorr  Time of first page:  2024  MD notified (2nd page):K. Schorr  Time of second page:2045  MD notified (3rd page): Dr. Fredirick Maudlin  Time of Third page: 2102  Responding MD:  Fredirick Maudlin  Time MD responded:  2105

## 2015-08-02 NOTE — Progress Notes (Signed)
[place] Progress Note Patient Name: Teresa Gonzalez DOB: [birth year] MRN: [id number]   Date of Service  08/02/2015  HPI/Events of Note  Patient requests sleeping aide.   eICU Interventions  Will order Xanax 0.25 mg PO X 1.      Intervention Category Minor Interventions: Routine modifications to care plan (e.g. PRN medications for pain, fever)  Ma Munoz Eugene 08/02/2015, 1:35 AM

## 2015-08-02 NOTE — Progress Notes (Signed)
[place] TEAM 1 - Stepdown/ICU TEAM Progress Note  Finl[place] HOBDY NLG:[id number] DOB: [birth year] DOA: 07/31/2015 PCP: [name], MD  Admit HPI / Brief Narrative: 81 y.o. WF PMHx depression, insomnia, hypothyroidism, peripheral neuropathy, uterine cancer S/P XRT, S/P small bowel resection   as outlined below. She was brought to [facility] ED during early AM hours 10/21 due to SOB and "just feeling bad". She apparently has "felt bad" for the past few days with no specific complaint. On night of 10/20 while trying to go to sleep, she began to experience SOB and palpitations. EMS was dispatched and on their arrival she was found to be in A.fib with RVR (HR in 180's). She was given albuterol, atrovent, adenosine x 2, 10mg  Cardizem, and 450cc NS bolus.  On ED arrival, she was hypotensive; however, this responded well to IVF's. She was placed on BiPAP due to hypoxia and increased WOB. Per pt, BiPAP has helped relieve her sensation of SOB. CXR was obtained and revealed RUL infiltrate. She had 1 documented rectal temp of 101.35F and initial lactate was elevated at 3.38. PCCM was called for admission.  She has not had any fevers/chills/sweats, chest pain, cough, wheezing, N/V/D, abdominal pain, myalgias. No recent travel or exposure to known sick contacts. Her sister does live in a nursing home and she does visit her, but she had not had any overnight stays in health care facility's and has not been hospitalized recently.  At the time of my assessment, I feel that she can come off of BiPAP; however, she asks if she can continue it until morning given the fact that she feels more comfortable with it and state her SOB has improved with it.  HPI/Subjective: 10/22, A/O 4, positive SOB, negative CP, negative N/V. States does not use home O2    Assessment/Plan: Acute respiratory failure with hypoxia /CAP  - bilateral infiltrates, R > L. -Empiric abx (ceftriaxone, azithromycin). -Sputum cultures,  blood cultures. -Trend lactate. -Continue BiPAP as needed. -Supplemental O2 as needed when not on BiPAP. -Solu-Medrol 60 mg daily -Xopenex QID -Mucinex DM -Flutter valve  Atrial Fibrillation unspecified type (new) -Currently in NSR -LBBB -Obtain echocardiogram  -Strict in and out -Daily weight  ACS? -Troponin leak, most likely demand ischemia; trend troponins.  Hyponatremia  - likely hypovolemic. Normal saline 26ml/hr  Hypokalemia. -Resolved monitor closely  Hypomagnesemia -Resolved . ? Hypocalcemia   Hypothyroidism. -Continue synthroid 50 g daily . -TSH WNL.  ? Irritable bowel with fecal incontinence. -Continue outpatient PPI, bentyl, loperamide.  Osteoporosis. Continue outpatient raloxifene 60 mg daily.    Code Status: DO NOT RESUSCITATE Family Communication: no family present at time of exam Disposition Plan: Resolution CAP    Consultants: Dr.Wesam Kathryne Sharper [facility] M)   Procedure/Significant Events: CXR 10/21 >>> RUL infiltrate c/w CAP. Mild left basilar opacity likely also reflects PNA.   Culture 10/21 blood left hand/wrist NGTD 10/21 urine multiple species present 10/21 MRSA by PCR negative   Antibiotics: Azithromycin 10/22>> Ceftriaxone 10/21>>   DVT prophylaxis: Heparin drip   Devices    LINES / TUBES:      Continuous Infusions: . sodium chloride 75 mL/hr at 08/02/15 1804  . heparin 800 Units/hr (08/02/15 1900)    Objective: VITAL SIGNS: Temp: 98.1 F (36.7 C) (10/22 1623) Temp Source: Oral (10/22 1623) BP: 155/77 mmHg (10/22 1820) Pulse Rate: 92 (10/22 1900) SPO2; FIO2:   Intake/Output Summary (Last 24 hours) at 08/02/15 1957 Last data filed at 08/02/15 1900  Gross per 24  hour  Intake 1415.67 ml  Output    675 ml  Net 740.67 ml     Exam: General: A/O 4, positive acute respiratory distress (course, talks in 5 word sentences) Eyes: Negative headache, eye pain, double vision,negative scleral  hemorrhage ENT: Negative Runny nose, negative gingival bleeding, Neck:  Negative scars, masses, torticollis, lymphadenopathy, JVD Lungs: poor air movement diffusely, mild by basilar bilaterally wheezes, negative crackles Cardiovascular: Regular rate and rhythm without murmur gallop or rub normal S1 and S2 Abdomen:negative abdominal pain, nondistended, positive soft, bowel sounds, no rebound, no ascites, no appreciable mass Extremities: No significant cyanosis, clubbing, or edema bilateral lower extremities Psychiatric:  Negative depression, negative anxiety, negative fatigue, negative mania  Neurologic:  Cranial nerves II through XII intact, tongue/uvula midline, all extremities muscle strength 5/5, sensation intact throughout, negative dysarthria, negative expressive aphasia, negative receptive aphasia.   Data Reviewed: Basic Metabolic Panel:  Recent Labs Lab 08/01/15 0008 08/01/15 0445 08/02/15 0257  NA 126* 127* 129*  K 3.2* 3.9 4.2  CL 96* 98* 101  CO2 20* 18* 21*  GLUCOSE 179* 138* 100*  BUN 9 9 7   CREATININE 0.81 0.75 0.50  CALCIUM 7.9* 7.7* 8.1*  MG 1.3* 2.7* 1.8  PHOS  --  2.6 2.5   Liver Function Tests: No results for input(s): AST, ALT, ALKPHOS, BILITOT, PROT, ALBUMIN in the last 168 hours. No results for input(s): LIPASE, AMYLASE in the last 168 hours. No results for input(s): AMMONIA in the last 168 hours. CBC:  Recent Labs Lab 08/01/15 0008 08/01/15 0445 08/02/15 0257  WBC 8.5 7.9 5.8  NEUTROABS 7.4  --   --   HGB 12.2 11.4* 11.2*  HCT 36.6 33.5* 33.4*  MCV 91.3 92.3 91.5  PLT 208 139* 195   Cardiac Enzymes:  Recent Labs Lab 08/01/15 0445 08/01/15 1128 08/02/15 1700  TROPONINI 0.27* 0.29* 0.10*   BNP (last 3 results)  Recent Labs  08/01/15 0008  BNP 273.1*    ProBNP (last 3 results) No results for input(s): PROBNP in the last 8760 hours.  CBG: No results for input(s): GLUCAP in the last 168 hours.  Recent Results (from the past 240  hour(s))  Blood Culture (routine x 2)     Status: None (Preliminary result)   Collection Time: 08/01/15  1:13 AM  Result Value Ref Range Status   Specimen Description BLOOD LEFT HAND  Final   Special Requests BOTTLES DRAWN AEROBIC AND ANAEROBIC 5CC  Final   Culture NO GROWTH 1 DAY  Final   Report Status PENDING  Incomplete  Blood Culture (routine x 2)     Status: None (Preliminary result)   Collection Time: 08/01/15  1:17 AM  Result Value Ref Range Status   Specimen Description BLOOD LEFT WRIST  Final   Special Requests BOTTLES DRAWN AEROBIC AND ANAEROBIC 5CC  Final   Culture NO GROWTH 1 DAY  Final   Report Status PENDING  Incomplete  Urine culture     Status: None   Collection Time: 08/01/15  1:25 AM  Result Value Ref Range Status   Specimen Description URINE, RANDOM  Final   Special Requests NONE  Final   Culture MULTIPLE SPECIES PRESENT, SUGGEST RECOLLECTION  Final   Report Status 08/02/2015 FINAL  Final  MRSA PCR Screening     Status: None   Collection Time: 08/01/15  3:42 AM  Result Value Ref Range Status   MRSA by PCR NEGATIVE NEGATIVE Final    Comment:  The GeneXpert MRSA Assay (FDA approved for NASAL specimens only), is one component of a comprehensive MRSA colonization surveillance program. It is not intended to diagnose MRSA infection nor to guide or monitor treatment for MRSA infections.      Studies:  Recent x-ray studies have been reviewed in detail by the Attending Physician  Scheduled Meds:  Scheduled Meds: . antiseptic oral rinse  7 mL Mouth Rinse BID  . aspirin EC  81 mg Oral QHS  . azithromycin  500 mg Intravenous Q24H  . cefTRIAXone (ROCEPHIN)  IV  2 g Intravenous Q24H  . dextromethorphan-guaiFENesin  1 tablet Oral BID  . dicyclomine  20 mg Oral BID  . levalbuterol  1.25 mg Nebulization 4 times per day  . levothyroxine  50 mcg Oral QAC breakfast  . methylPREDNISolone (SOLU-MEDROL) injection  60 mg Intravenous Q24H  . multivitamin with  minerals  1 tablet Oral Daily  . pantoprazole  40 mg Oral Daily  . raloxifene  60 mg Oral Daily    Time spent on care of this patient: 40 mins   WOODS, Geraldo Docker , MD  [facility] Office  [phone] Pager [phone]  On-Call/Text Page:      [email or web address]      password [email or web address]  If 7PM-7AM, please contact night-coverage [email or web address] Password [email or web address] 08/02/2015, 7:57 PM   LOS: 1 day   Care during the described time interval was provided by me .  I have reviewed this patient's available data, including medical history, events of note, physical examination, and all test results as part of my evaluation. I have personally reviewed and interpreted all radiology studies.   Dia Crawford, MD [phone] Pager

## 2015-08-02 NOTE — Evaluation (Signed)
Physical Therapy Evaluation Patient Details Name: Teresa Gonzalez MRN: [id number] DOB: Nov 13, 1921 Today's Date: 08/02/2015   History of Present Illness  pt is a 81 y/o with pmhx of peripheral neuropathy, uterine CA s/p radiation, admitted with SOB due to CAP with hypoxia.   Clinical Impression  Pt admitted with/for CAP.  Pt currently limited functionally due to the problems listed below.  (see problems list.)  Pt will benefit from PT to maximize function and safety to be able to get home safely with available assist of family and PCA.     Follow Up Recommendations Home health PT    Equipment Recommendations  None recommended by PT    Recommendations for Other Services       Precautions / Restrictions Precautions Precautions: Fall Restrictions Weight Bearing Restrictions: No      Mobility  Bed Mobility               General bed mobility comments: NT  Transfers Overall transfer level: Needs assistance Equipment used: Rolling walker (2 wheeled) Transfers: Sit to/from Stand Sit to Stand: Min assist         General transfer comment: Used 1 hand on armrest, but pulled too hard on RW that RW needed holding.  Ambulation/Gait Ambulation/Gait assistance: Min guard;Min assist Ambulation Distance (Feet): 125 Feet Assistive device: Rolling walker (2 wheeled) Gait Pattern/deviations: Step-through pattern;Drifts right/left;Narrow base of support Gait velocity: slow and not variable Gait velocity interpretation: Below normal speed for age/gender General Gait Details: mildly unsteady with RW, but no need to assist for stability in general except for when asked to scan around or during turns when pt in notably more unsteady.  Stairs            Wheelchair Mobility    Modified Rankin (Stroke Patients Only)       Balance Overall balance assessment: Needs assistance Sitting-balance support: No upper extremity supported Sitting balance-Leahy Scale: Fair Sitting  balance - Comments: can reach outside BOS for objects     Standing balance-Leahy Scale: Poor Standing balance comment: reliant on the RW                             Pertinent Vitals/Pain Pain Assessment: No/denies pain    Home Living Family/patient expects to be discharged to:: Private residence Living Arrangements: Spouse/significant other Available Help at Discharge: Family;Personal care attendant;Available 24 hours/day (PCA 5 days/wk 8-4:30) Type of Home: House Home Access: Stairs to enter Entrance Stairs-Rails:  (support) Entrance Stairs-Number of Steps: 1 Home Layout: One level Home Equipment: Walker - 4 wheels      Prior Function Level of Independence: Needs assistance   Gait / Transfers Assistance Needed: I in home with RW  ADL's / Homemaking Assistance Needed: assisted by PCA for some ADLS  Comments: pt walks outside with PCA and runs errands with PCA assist     Hand Dominance        Extremity/Trunk Assessment   Upper Extremity Assessment: Defer to OT evaluation           Lower Extremity Assessment: Generalized weakness;Overall WFL for tasks assessed (proximal weakness and lower trunk weakness)         Communication   Communication: No difficulties  Cognition Arousal/Alertness: Awake/alert Behavior During Therapy: WFL for tasks assessed/performed Overall Cognitive Status: Within Functional Limits for tasks assessed  General Comments General comments (skin integrity, edema, etc.): VSS on RA and with ambulation    Exercises        Assessment/Plan    PT Assessment Patient needs continued PT services  PT Diagnosis Difficulty walking;Generalized weakness   PT Problem List Decreased strength;Decreased activity tolerance;Decreased balance;Decreased mobility;Decreased knowledge of use of DME  PT Treatment Interventions DME instruction;Gait training;Stair training;Functional mobility training;Therapeutic  activities;Balance training;Patient/family education   PT Goals (Current goals can be found in the Care Plan section) Acute Rehab PT Goals Patient Stated Goal: move more safely PT Goal Formulation: With patient Time For Goal Achievement: 08/09/15 Potential to Achieve Goals: Good    Frequency Min 3X/week   Barriers to discharge        Co-evaluation               End of Session   Activity Tolerance: Patient tolerated treatment well Patient left: in chair;with call bell/phone within reach;with family/visitor present Nurse Communication: Mobility status         Time: 1045-1110 PT Time Calculation (min) (ACUTE ONLY): 25 min   Charges:   PT Evaluation $Initial PT Evaluation Tier I: 1 Procedure PT Treatments $Gait Training: 8-22 mins   PT G Codes:        Abigaelle Verley, Tessie Fass 08/02/2015, 11:24 AM 08/02/2015  Donnella Sham, PT [phone]  (pager)

## 2015-08-02 NOTE — Progress Notes (Signed)
ANTICOAGULATION CONSULT NOTE - Follow Up Consult  Pharmacy Consult for heparin Indication: atrial fibrillation   Patient Measurements: Height: 5\' 5"  (165.1 cm) Weight: 113 lb 5.1 oz (51.4 kg) IBW/kg (Calculated) : 57   Vital Signs: Temp: 98 F (36.7 C) (10/22 0322) Temp Source: Oral (10/22 0322) BP: 158/81 mmHg (10/22 0600) Pulse Rate: 85 (10/22 0600)  Labs:  Recent Labs  08/01/15 0008 08/01/15 0445 08/01/15 1128 08/01/15 2015 08/02/15 0257  HGB 12.2 11.4*  --   --  11.2*  HCT 36.6 33.5*  --   --  33.4*  PLT 208 139*  --   --  195  HEPARINUNFRC  --   --  0.34 0.30 0.34  CREATININE 0.81 0.75  --   --  0.50  TROPONINI  --  0.27* 0.29*  --   --     Estimated Creatinine Clearance: 37.2 mL/min (by C-G formula based on Cr of 0.5).   Medications:  Scheduled:  . antiseptic oral rinse  7 mL Mouth Rinse BID  . aspirin EC  81 mg Oral QHS  . azithromycin  500 mg Intravenous Q24H  . cefTRIAXone (ROCEPHIN)  IV  2 g Intravenous Q24H  . dicyclomine  20 mg Oral BID  . levothyroxine  50 mcg Oral QAC breakfast  . multivitamin with minerals  1 tablet Oral Daily  . pantoprazole  40 mg Oral Daily  . raloxifene  60 mg Oral Daily   Infusions:  . heparin 800 Units/hr (08/01/15 2100)    Assessment: 81yo female admitted on 07/31/2015 c/o SOB, found to be in new-onset Afib w/ RVR (CHADSVASC= 5; HTN, age, gender, HF) on heparin. Heparin is at 800 units/hr and level remains therapeutic at 0.34. H/H remains low but stable, plt wnl with no reported significant s/s bleeding.   Goal of Therapy:  Heparin level 0.3-0.7 units/ml Monitor platelets by anticoagulation protocol: Yes   Plan:  - Continue heparin gtt at 800units/hr - Daily HL/CBC - Will follow long-term anticoagulation plans  Kaleiah Kutzer K. Velva Harman, PharmD, BCPS, CPP Clinical Pharmacist Pager: [phone] Phone: [phone] 08/02/2015 7:31 AM

## 2015-08-02 NOTE — Progress Notes (Addendum)
CRITICAL VALUE ALERT  Critical value received:  Lactic Acid 2.3  Date of notification:  08/02/2015  Time of notification:  5750  Critical value read back:Yes.    Nurse who received alert:  Bobbe Medico, RN  MD notified (1st page):  Dr. Sherral Hammers  Time of first page:  1830  MD notified (2nd page):  Time of second page:  Responding MD:  Text paged Dr. Sherral Hammers.  Lab is lower than previous Lactic acid.  Will continue to monitor..  Time MD responded:

## 2015-08-03 ENCOUNTER — Inpatient Hospital Stay (HOSPITAL_COMMUNITY): Payer: Commercial Managed Care - HMO

## 2015-08-03 DIAGNOSIS — R0600 Dyspnea, unspecified: Secondary | ICD-10-CM

## 2015-08-03 LAB — TROPONIN I
TROPONIN I: 0.05 ng/mL — AB (ref <0.031)
TROPONIN I: 0.07 ng/mL — AB (ref <0.031)

## 2015-08-03 LAB — CBC WITH DIFFERENTIAL/PLATELET
BASOS ABS: 0 10*3/uL (ref 0.0–0.1)
Basophils Relative: 0 %
Eosinophils Absolute: 0 10*3/uL (ref 0.0–0.7)
Eosinophils Relative: 0 %
HCT: 33.1 % — ABNORMAL LOW (ref 36.0–46.0)
HEMOGLOBIN: 11.2 g/dL — AB (ref 12.0–15.0)
LYMPHS ABS: 0.7 10*3/uL (ref 0.7–4.0)
LYMPHS PCT: 18 %
MCH: 30.9 pg (ref 26.0–34.0)
MCHC: 33.8 g/dL (ref 30.0–36.0)
MCV: 91.2 fL (ref 78.0–100.0)
Monocytes Absolute: 0.2 10*3/uL (ref 0.1–1.0)
Monocytes Relative: 4 %
NEUTROS PCT: 78 %
Neutro Abs: 2.9 10*3/uL (ref 1.7–7.7)
Platelets: 222 10*3/uL (ref 150–400)
RBC: 3.63 MIL/uL — AB (ref 3.87–5.11)
RDW: 13.3 % (ref 11.5–15.5)
WBC: 3.7 10*3/uL — AB (ref 4.0–10.5)

## 2015-08-03 LAB — COMPREHENSIVE METABOLIC PANEL
ALT: 20 U/L (ref 14–54)
ANION GAP: 6 (ref 5–15)
AST: 28 U/L (ref 15–41)
Albumin: 2.6 g/dL — ABNORMAL LOW (ref 3.5–5.0)
Alkaline Phosphatase: 61 U/L (ref 38–126)
BILIRUBIN TOTAL: 0.4 mg/dL (ref 0.3–1.2)
BUN: 6 mg/dL (ref 6–20)
CHLORIDE: 101 mmol/L (ref 101–111)
CO2: 25 mmol/L (ref 22–32)
Calcium: 8.2 mg/dL — ABNORMAL LOW (ref 8.9–10.3)
Creatinine, Ser: 0.55 mg/dL (ref 0.44–1.00)
Glucose, Bld: 157 mg/dL — ABNORMAL HIGH (ref 65–99)
POTASSIUM: 3.9 mmol/L (ref 3.5–5.1)
Sodium: 132 mmol/L — ABNORMAL LOW (ref 135–145)
TOTAL PROTEIN: 5.2 g/dL — AB (ref 6.5–8.1)

## 2015-08-03 LAB — HEPARIN LEVEL (UNFRACTIONATED): HEPARIN UNFRACTIONATED: 0.37 [IU]/mL (ref 0.30–0.70)

## 2015-08-03 LAB — MAGNESIUM: Magnesium: 1.5 mg/dL — ABNORMAL LOW (ref 1.7–2.4)

## 2015-08-03 LAB — CALCIUM, IONIZED: CALCIUM, IONIZED, SERUM: 4.6 mg/dL (ref 4.5–5.6)

## 2015-08-03 LAB — LACTIC ACID, PLASMA
Lactic Acid, Venous: 1.6 mmol/L (ref 0.5–2.0)
Lactic Acid, Venous: 3.1 mmol/L (ref 0.5–2.0)
Lactic Acid, Venous: 3.2 mmol/L (ref 0.5–2.0)

## 2015-08-03 MED ORDER — LEVALBUTEROL HCL 1.25 MG/0.5ML IN NEBU
1.2500 mg | INHALATION_SOLUTION | Freq: Four times a day (QID) | RESPIRATORY_TRACT | Status: DC
  Administered 2015-08-03 – 2015-08-04 (×7): 1.25 mg via RESPIRATORY_TRACT
  Filled 2015-08-03 (×8): qty 0.5

## 2015-08-03 MED ORDER — AZITHROMYCIN 250 MG PO TABS
250.0000 mg | ORAL_TABLET | ORAL | Status: DC
  Administered 2015-08-03 – 2015-08-05 (×3): 250 mg via ORAL
  Filled 2015-08-03 (×4): qty 1

## 2015-08-03 MED ORDER — CEFIXIME 400 MG PO CAPS: 400.0000 mg | ORAL_CAPSULE | Freq: Every day | ORAL | Status: DC

## 2015-08-03 MED ORDER — MAGNESIUM OXIDE 400 (241.3 MG) MG PO TABS
400.0000 mg | ORAL_TABLET | Freq: Two times a day (BID) | ORAL | Status: AC
  Administered 2015-08-03 (×2): 400 mg via ORAL
  Filled 2015-08-03 (×2): qty 1

## 2015-08-03 MED ORDER — CEFIXIME 100 MG/5ML PO SUSR
260.0000 mg | Freq: Every day | ORAL | Status: DC
  Administered 2015-08-04 – 2015-08-06 (×3): 260 mg via ORAL
  Filled 2015-08-03 (×3): qty 13

## 2015-08-03 MED ORDER — SODIUM CHLORIDE 0.9 % IV BOLUS (SEPSIS)
500.0000 mL | Freq: Once | INTRAVENOUS | Status: AC
  Administered 2015-08-03: 500 mL via INTRAVENOUS

## 2015-08-03 NOTE — Progress Notes (Signed)
Rt note: Pt not on bipap at this time.  Rt will continue to monitor.

## 2015-08-03 NOTE — Progress Notes (Signed)
CRITICAL VALUE ALERT  Critical value received:  Lactic Acid  Date of notification:  08/03/2015  Time of notification:  0109  Critical value read back:Yes.    Nurse who received alert:  Rosebud Poles RN  MD notified (1st page):  Raliegh Ip Schorr  Time of first page:  0130  Responding MD:  Lamar Blinks  Time MD responded:  [phone]

## 2015-08-03 NOTE — Progress Notes (Signed)
Pt. HR going in and out of ST and A fib RVR. Highest HR in 150s while moving from bedside commode. Informed L. [place]. Will continue to monitor closely.

## 2015-08-03 NOTE — Progress Notes (Signed)
[place] TEAM 1 - Stepdown/ICU TEAM Progress Note  Teresa Gonzalez:[id number] DOB: 13-Jul-1922 DOA: 07/31/2015 PCP: [name], MD  Admit HPI / Brief Narrative: 81 y.o. WF PMHx depression, insomnia, hypothyroidism, peripheral neuropathy, uterine cancer S/P XRT, S/P small bowel resection   as outlined below. She was brought to [facility] ED during early AM hours 10/21 due to SOB and "just feeling bad". She apparently has "felt bad" for the past few days with no specific complaint. On night of 10/20 while trying to go to sleep, she began to experience SOB and palpitations. EMS was dispatched and on their arrival she was found to be in A.fib with RVR (HR in 180's). She was given albuterol, atrovent, adenosine x 2, 10mg  Cardizem, and 450cc NS bolus.  On ED arrival, she was hypotensive; however, this responded well to IVF's. She was placed on BiPAP due to hypoxia and increased WOB. Per pt, BiPAP has helped relieve her sensation of SOB. CXR was obtained and revealed RUL infiltrate. She had 1 documented rectal temp of 101.51F and initial lactate was elevated at 3.38. PCCM was called for admission.  She has not had any fevers/chills/sweats, chest pain, cough, wheezing, N/V/D, abdominal pain, myalgias. No recent travel or exposure to known sick contacts. Her sister does live in a nursing home and she does visit her, but she had not had any overnight stays in health care facility's and has not been hospitalized recently.  At the time of my assessment, I feel that she can come off of BiPAP; however, she asks if she can continue it until morning given the fact that she feels more comfortable with it and state her SOB has improved with it.  HPI/Subjective: 10/23, A/O 4, negative SOB, negative CP, negative N/V. States does not use home O2    Assessment/Plan: Acute respiratory failure with hypoxia /CAP  - bilateral infiltrates, R > L. -Empiric abx  for 7 days. -Sputum cultures, blood  cultures. -Trend lactate. -Continue BiPAP as needed. -Supplemental O2 as needed when not on BiPAP. -Solu-Medrol 60 mg daily -Xopenex QID -Mucinex DM -Flutter valve -10/23 PT/OT pending -Ambulatory SPO2  Atrial Fibrillation unspecified type (new) -Currently in NSR -LBBB -Obtain echocardiogram  -Strict in and out since admission; + 2.5 L -Daily weight; 10/23 bed weight= 49.7 kg  ACS? -Trending down most likely demand ischemia  Hyponatremia  - likely hypovolemic. Improving with hydration  -Continue Normal saline 46ml/hr  Hypokalemia. -Resolved monitor closely  Hypomagnesemia -Magnesium oxide 400 mg  BID 2 doses   Hypothyroidism. -Continue synthroid 50 g daily . -TSH WNL.  ? Irritable bowel with fecal incontinence. -Continue outpatient PPI, bentyl, loperamide.  Osteoporosis. Continue outpatient raloxifene 60 mg daily.    Code Status: DO NOT RESUSCITATE Family Communication: no family present at time of exam Disposition Plan: Resolution CAP    Consultants: Dr.Wesam [facility]ryne [facility] M)   Procedure/Significant Events: CXR 10/21 >>> RUL infiltrate c/w CAP. Mild left basilar opacity likely also reflects PNA. 10/23 echocardiogram;- Left ventricle: moderate LVH. Focal basal hypertrophy.  -LVEF= 50% to 55%. - Mitral valve: moderate regurgitation. - Pulmonary arteries: PA peak pressure: 34 mm Hg (S).  Culture 10/21 blood left hand/wrist NGTD 10/21 urine multiple species present 10/21 MRSA by PCR negative 10/22 urine pending   Antibiotics: Azithromycin 10/22>> Ceftriaxone 10/21>> stop 10/23 Cefixime 10/24>>   DVT prophylaxis: Heparin drip   Devices    LINES / TUBES:      Continuous Infusions: . sodium chloride 75 mL/hr at  08/03/15 0023  . heparin 800 Units/hr (08/02/15 1900)    Objective: VITAL SIGNS: Temp: 97.9 F (36.6 C) (10/23 1122) Temp Source: Oral (10/23 1122) BP: 150/83 mmHg (10/23 1122) Pulse Rate: 88 (10/23  1122) SPO2; FIO2:   Intake/Output Summary (Last 24 hours) at 08/03/15 1308 Last data filed at 08/03/15 1220  Gross per 24 hour  Intake 2828.42 ml  Output   1100 ml  Net 1728.42 ml     Exam: General: A/O 4, negative acute respiratory distress  Eyes: Negative headache, eye pain, double vision,negative scleral hemorrhage ENT: Negative Runny nose, negative gingival bleeding, Neck:  Negative scars, masses, torticollis, lymphadenopathy, JVD Lungs: clear to auscultation bilateral, negative wheezes, negative crackles Cardiovascular: Regular rate and rhythm without murmur gallop or rub normal S1 and S2 Abdomen:negative abdominal pain, nondistended, positive soft, bowel sounds, no rebound, no ascites, no appreciable mass Extremities: No significant cyanosis, clubbing, or edema bilateral lower extremities Psychiatric:  Negative depression, negative anxiety, negative fatigue, negative mania  Neurologic:  Cranial nerves II through XII intact, tongue/uvula midline, all extremities muscle strength 5/5, sensation intact throughout, negative dysarthria, negative expressive aphasia, negative receptive aphasia.   Data Reviewed: Basic Metabolic Panel:  Recent Labs Lab 08/01/15 0008 08/01/15 0445 08/02/15 0257 08/03/15 0443  NA 126* 127* 129* 132*  K 3.2* 3.9 4.2 3.9  CL 96* 98* 101 101  CO2 20* 18* 21* 25  GLUCOSE 179* 138* 100* 157*  BUN 9 9 7 6   CREATININE 0.81 0.75 0.50 0.55  CALCIUM 7.9* 7.7* 8.1* 8.2*  MG 1.3* 2.7* 1.8 1.5*  PHOS  --  2.6 2.5  --    Liver Function Tests:  Recent Labs Lab 08/03/15 0443  AST 28  ALT 20  ALKPHOS 61  BILITOT 0.4  PROT 5.2*  ALBUMIN 2.6*   No results for input(s): LIPASE, AMYLASE in the last 168 hours. No results for input(s): AMMONIA in the last 168 hours. CBC:  Recent Labs Lab 08/01/15 0008 08/01/15 0445 08/02/15 0257 08/03/15 0443  WBC 8.5 7.9 5.8 3.7*  NEUTROABS 7.4  --   --  2.9  HGB 12.2 11.4* 11.2* 11.2*  HCT 36.6 33.5*  33.4* 33.1*  MCV 91.3 92.3 91.5 91.2  PLT 208 139* 195 222   Cardiac Enzymes:  Recent Labs Lab 08/01/15 0445 08/01/15 1128 08/02/15 1700 08/02/15 2331 08/03/15 0443  TROPONINI 0.27* 0.29* 0.10* 0.07* 0.05*   BNP (last 3 results)  Recent Labs  08/01/15 0008  BNP 273.1*    ProBNP (last 3 results) No results for input(s): PROBNP in the last 8760 hours.  CBG: No results for input(s): GLUCAP in the last 168 hours.  Recent Results (from the past 240 hour(s))  Blood Culture (routine x 2)     Status: None (Preliminary result)   Collection Time: 08/01/15  1:13 AM  Result Value Ref Range Status   Specimen Description BLOOD LEFT HAND  Final   Special Requests BOTTLES DRAWN AEROBIC AND ANAEROBIC 5CC  Final   Culture NO GROWTH 2 DAYS  Final   Report Status PENDING  Incomplete  Blood Culture (routine x 2)     Status: None (Preliminary result)   Collection Time: 08/01/15  1:17 AM  Result Value Ref Range Status   Specimen Description BLOOD LEFT WRIST  Final   Special Requests BOTTLES DRAWN AEROBIC AND ANAEROBIC 5CC  Final   Culture NO GROWTH 2 DAYS  Final   Report Status PENDING  Incomplete  Urine culture  Status: None   Collection Time: 08/01/15  1:25 AM  Result Value Ref Range Status   Specimen Description URINE, RANDOM  Final   Special Requests NONE  Final   Culture MULTIPLE SPECIES PRESENT, SUGGEST RECOLLECTION  Final   Report Status 08/02/2015 FINAL  Final  MRSA PCR Screening     Status: None   Collection Time: 08/01/15  3:42 AM  Result Value Ref Range Status   MRSA by PCR NEGATIVE NEGATIVE Final    Comment:        The GeneXpert MRSA Assay (FDA approved for NASAL specimens only), is one component of a comprehensive MRSA colonization surveillance program. It is not intended to diagnose MRSA infection nor to guide or monitor treatment for MRSA infections.      Studies:  Recent x-ray studies have been reviewed in detail by the Attending  Physician  Scheduled Meds:  Scheduled Meds: . antiseptic oral rinse  7 mL Mouth Rinse BID  . aspirin EC  81 mg Oral QHS  . azithromycin  500 mg Intravenous Q24H  . cefTRIAXone (ROCEPHIN)  IV  2 g Intravenous Q24H  . dextromethorphan-guaiFENesin  1 tablet Oral BID  . dicyclomine  20 mg Oral BID  . levalbuterol  1.25 mg Nebulization Q6H  . levothyroxine  50 mcg Oral QAC breakfast  . methylPREDNISolone (SOLU-MEDROL) injection  60 mg Intravenous Q24H  . multivitamin with minerals  1 tablet Oral Daily  . pantoprazole  40 mg Oral Daily  . raloxifene  60 mg Oral Daily    Time spent on care of this patient: 40 mins   Azlee Monforte, Geraldo Docker , MD  [facility] Office  [phone] Pager [phone]  On-Call/Text Page:      [email or web address]      password [email or web address]  If 7PM-7AM, please contact night-coverage [email or web address] Password [email or web address] 08/03/2015, 1:08 PM   LOS: 2 days   Care during the described time interval was provided by me .  I have reviewed this patient's available data, including medical history, events of note, physical examination, and all test results as part of my evaluation. I have personally reviewed and interpreted all radiology studies.   Dia Crawford, MD [phone] Pager

## 2015-08-03 NOTE — Progress Notes (Signed)
ANTICOAGULATION CONSULT NOTE - Follow Up Consult  Pharmacy Consult for heparin Indication: atrial fibrillation   Patient Measurements: Height: 5\' 5"  (165.1 cm) Weight: 109 lb 9.1 oz (49.7 kg) IBW/kg (Calculated) : 57   Vital Signs: Temp: 97.6 F (36.4 C) (10/23 0741) Temp Source: Oral (10/23 0741) BP: 158/80 mmHg (10/23 0741) Pulse Rate: 79 (10/23 0741)  Labs:  Recent Labs  08/01/15 0445  08/01/15 2015 08/02/15 0257 08/02/15 1700 08/02/15 2331 08/03/15 0443  HGB 11.4*  --   --  11.2*  --   --  11.2*  HCT 33.5*  --   --  33.4*  --   --  33.1*  PLT 139*  --   --  195  --   --  222  HEPARINUNFRC  --   < > 0.30 0.34  --   --  0.37  CREATININE 0.75  --   --  0.50  --   --  0.55  TROPONINI 0.27*  < >  --   --  0.10* 0.07* 0.05*  < > = values in this interval not displayed.  Estimated Creatinine Clearance: 35.9 mL/min (by C-G formula based on Cr of 0.55).   Medications:  Scheduled:  . antiseptic oral rinse  7 mL Mouth Rinse BID  . aspirin EC  81 mg Oral QHS  . azithromycin  500 mg Intravenous Q24H  . cefTRIAXone (ROCEPHIN)  IV  2 g Intravenous Q24H  . dextromethorphan-guaiFENesin  1 tablet Oral BID  . dicyclomine  20 mg Oral BID  . levalbuterol  1.25 mg Nebulization Q6H  . levothyroxine  50 mcg Oral QAC breakfast  . methylPREDNISolone (SOLU-MEDROL) injection  60 mg Intravenous Q24H  . multivitamin with minerals  1 tablet Oral Daily  . pantoprazole  40 mg Oral Daily  . raloxifene  60 mg Oral Daily   Infusions:  . sodium chloride 75 mL/hr at 08/03/15 0023  . heparin 800 Units/hr (08/02/15 1900)    Assessment: 81yo female admitted on 07/31/2015 c/o SOB, found to be in new-onset Afib w/ RVR (CHADSVASC= 5; HTN, age, gender, HF) on heparin. Heparin is at 800 units/hr and level remains therapeutic at 0.37. H/H remains low but stable, plt wnl with no reported significant s/s bleeding.   Goal of Therapy:  Heparin level 0.3-0.7 units/ml Monitor platelets by  anticoagulation protocol: Yes   Plan:  - Continue heparin gtt at 800units/hr - Daily HL/CBC - Will follow long-term anticoagulation plans  Rosco Harriott K. Velva Harman, PharmD, BCPS, CPP Clinical Pharmacist Pager: [phone] Phone: [phone] 08/03/2015 8:08 AM

## 2015-08-03 NOTE — Progress Notes (Signed)
  Echocardiogram 2D Echocardiogram has been performed.  Teresa Gonzalez 08/03/2015, 11:57 AM

## 2015-08-04 DIAGNOSIS — E876 Hypokalemia: Secondary | ICD-10-CM

## 2015-08-04 DIAGNOSIS — J189 Pneumonia, unspecified organism: Secondary | ICD-10-CM

## 2015-08-04 DIAGNOSIS — E871 Hypo-osmolality and hyponatremia: Secondary | ICD-10-CM

## 2015-08-04 DIAGNOSIS — J9601 Acute respiratory failure with hypoxia: Principal | ICD-10-CM

## 2015-08-04 DIAGNOSIS — I4891 Unspecified atrial fibrillation: Secondary | ICD-10-CM

## 2015-08-04 LAB — CBC WITH DIFFERENTIAL/PLATELET
BASOS PCT: 0 %
Basophils Absolute: 0 10*3/uL (ref 0.0–0.1)
Eosinophils Absolute: 0 10*3/uL (ref 0.0–0.7)
Eosinophils Relative: 0 %
HEMATOCRIT: 34.7 % — AB (ref 36.0–46.0)
HEMOGLOBIN: 11.6 g/dL — AB (ref 12.0–15.0)
LYMPHS PCT: 12 %
Lymphs Abs: 0.9 10*3/uL (ref 0.7–4.0)
MCH: 30.3 pg (ref 26.0–34.0)
MCHC: 33.4 g/dL (ref 30.0–36.0)
MCV: 90.6 fL (ref 78.0–100.0)
MONO ABS: 0.3 10*3/uL (ref 0.1–1.0)
MONOS PCT: 3 %
NEUTROS ABS: 6.4 10*3/uL (ref 1.7–7.7)
NEUTROS PCT: 85 %
Platelets: 260 10*3/uL (ref 150–400)
RBC: 3.83 MIL/uL — ABNORMAL LOW (ref 3.87–5.11)
RDW: 13.3 % (ref 11.5–15.5)
WBC: 7.6 10*3/uL (ref 4.0–10.5)

## 2015-08-04 LAB — COMPREHENSIVE METABOLIC PANEL
ALBUMIN: 2.8 g/dL — AB (ref 3.5–5.0)
ALT: 26 U/L (ref 14–54)
ANION GAP: 9 (ref 5–15)
AST: 39 U/L (ref 15–41)
Alkaline Phosphatase: 61 U/L (ref 38–126)
BUN: 11 mg/dL (ref 6–20)
CHLORIDE: 104 mmol/L (ref 101–111)
CO2: 24 mmol/L (ref 22–32)
Calcium: 8.9 mg/dL (ref 8.9–10.3)
Creatinine, Ser: 0.72 mg/dL (ref 0.44–1.00)
GFR calc non Af Amer: >60 mL/min (ref >60)
GLUCOSE: 154 mg/dL — AB (ref 65–99)
POTASSIUM: 4 mmol/L (ref 3.5–5.1)
SODIUM: 137 mmol/L (ref 135–145)
Total Bilirubin: 0.5 mg/dL (ref 0.3–1.2)
Total Protein: 5.5 g/dL — ABNORMAL LOW (ref 6.5–8.1)

## 2015-08-04 LAB — MAGNESIUM: Magnesium: 1.6 mg/dL — ABNORMAL LOW (ref 1.7–2.4)

## 2015-08-04 LAB — URINE CULTURE: Culture: NO GROWTH

## 2015-08-04 LAB — HEPARIN LEVEL (UNFRACTIONATED): Heparin Unfractionated: 0.3 IU/mL (ref 0.30–0.70)

## 2015-08-04 MED ORDER — METOPROLOL TARTRATE 25 MG PO TABS
25.0000 mg | ORAL_TABLET | Freq: Two times a day (BID) | ORAL | Status: DC
  Administered 2015-08-04 – 2015-08-06 (×5): 25 mg via ORAL
  Filled 2015-08-04 (×5): qty 1

## 2015-08-04 MED ORDER — MAGNESIUM SULFATE 2 GM/50ML IV SOLN
2.0000 g | Freq: Once | INTRAVENOUS | Status: AC
  Administered 2015-08-04: 2 g via INTRAVENOUS
  Filled 2015-08-04: qty 50

## 2015-08-04 MED ORDER — LEVALBUTEROL HCL 1.25 MG/0.5ML IN NEBU: 1.2500 mg | INHALATION_SOLUTION | Freq: Four times a day (QID) | RESPIRATORY_TRACT | Status: DC | PRN

## 2015-08-04 NOTE — Progress Notes (Signed)
[place] TEAM 1 - Stepdown/ICU TEAM PROGRESS NOTE  Teresa Gonzalez SVX:[id number] DOB: 1921-10-22 DOA: 07/31/2015 PCP: Teresa Reel, MD  Admit HPI / Brief Narrative: 81 y.o. F Hx depression, insomnia, hypothyroidism, peripheral neuropathy, uterine cancer S/P XRT, and S/P small bowel resection who was brought to [facility] ED during early AM hours 10/21 due to SOB and "just feeling bad". EMS was dispatched and on their arrival she was found to be in A.fib with RVR (HR in 180's). She was given albuterol, atrovent, adenosine x 2, 10mg  Cardizem, and 450cc NS bolus.  On ED arrival, she was hypotensive; however, this responded well to IVF's. She was placed on BiPAP due to hypoxia and increased WOB. CXR revealed RUL infiltrate. She had a rectal temp of 101.56F and initial lactate was elevated at 3.38.  HPI/Subjective: Pt is resting comfortably.  She denies cp, sob, n/v, or abdom pain.  She has an episode of RVR to 150 last PM when exerting herself.    Assessment/Plan:  Acute respiratory failure with hypoxia /CAP  -bilateral basilar infiltrates R > L - clinically improving  -empiric abxfor 7 days  Newly diagnosed Atrial Fibrillation w/ RVR   -relapsed back into Afib last PM w/ RVR - CHA2DS2 VASc score is 4 - Has Bled score 2-3 - will allow PT to evaluate pt and then make decision on long term anticoag use at home - she would be interested if we recommend that she be on a blood thinner   Elevated troponin - ACS? -most likely demand ischemia - trended down w/o cp   Chronic LBBB  Hyponatremia  -hypovolemic - resolved with hydration   Hypokalemia -Resolved  Hypomagnesemia -replace and follow - goal is 2.0  Hypothyroidism -Continue synthroid 50 g daily  -TSH WNL  ? Irritable bowel with fecal incontinence -Continue outpatient PPI, bentyl, loperamide  Osteoporosis Continue outpatient raloxifene 60 mg daily  Code Status: DNR Family Communication: no family present at time of  exam Disposition Plan: PT/OT evals today - make decision on Eliquis or ASA alone - add BB to control HR - possible d/c home 10/25  Consultants: PCCM  Procedures: 10/23 TTE - Left ventricle: moderate LVH. Focal basal hypertrophy -LVEF= 50% to 55%. - Mitral valve: moderate regurgitation - Pulmonary arteries: PA peak pressure: 34 mm Hg (S).  Antibiotics: Azithromycin 10/22>> Ceftriaxone 10/21>> 10/23 Cefixime 10/24>>  DVT prophylaxis: IV heparin   Objective: Blood pressure 148/71, pulse 91, temperature 97.6 F (36.4 C), temperature source Oral, resp. rate 22, height 5\' 5"  (1.651 m), weight 51.03 kg (112 lb 8 oz), SpO2 96 %.  Intake/Output Summary (Last 24 hours) at 08/04/15 1138 Last data filed at 08/04/15 1000  Gross per 24 hour  Intake   2186 ml  Output   1877 ml  Net    309 ml   Exam: General: No acute respiratory distress Lungs: mild bibasilar crackles - no wheeze  Cardiovascular: Regular rate and rhythm without murmur gallop or rub  Abdomen: Nontender, nondistended, soft, bowel sounds positive, no rebound, no ascites, no appreciable mass Extremities: No significant cyanosis, clubbing, or edema bilateral lower extremities  Data Reviewed: Basic Metabolic Panel:  Recent Labs Lab 08/01/15 0008 08/01/15 0445 08/02/15 0257 08/03/15 0443 08/04/15 0255  NA 126* 127* 129* 132* 137  K 3.2* 3.9 4.2 3.9 4.0  CL 96* 98* 101 101 104  CO2 20* 18* 21* 25 24  GLUCOSE 179* 138* 100* 157* 154*  BUN 9 9 7 6 11   CREATININE 0.81  0.75 0.50 0.55 0.72  CALCIUM 7.9* 7.7* 8.1* 8.2* 8.9  MG 1.3* 2.7* 1.8 1.5* 1.6*  PHOS  --  2.6 2.5  --   --     CBC:  Recent Labs Lab 08/01/15 0008 08/01/15 0445 08/02/15 0257 08/03/15 0443 08/04/15 0255  WBC 8.5 7.9 5.8 3.7* 7.6  NEUTROABS 7.4  --   --  2.9 6.4  HGB 12.2 11.4* 11.2* 11.2* 11.6*  HCT 36.6 33.5* 33.4* 33.1* 34.7*  MCV 91.3 92.3 91.5 91.2 90.6  PLT 208 139* 195 222 260    Liver Function Tests:  Recent Labs Lab  08/03/15 0443 08/04/15 0255  AST 28 39  ALT 20 26  ALKPHOS 61 61  BILITOT 0.4 0.5  PROT 5.2* 5.5*  ALBUMIN 2.6* 2.8*    Cardiac Enzymes:  Recent Labs Lab 08/01/15 0445 08/01/15 1128 08/02/15 1700 08/02/15 2331 08/03/15 0443  TROPONINI 0.27* 0.29* 0.10* 0.07* 0.05*    Recent Results (from the past 240 hour(s))  Blood Culture (routine x 2)     Status: None (Preliminary result)   Collection Time: 08/01/15  1:13 AM  Result Value Ref Range Status   Specimen Description BLOOD LEFT HAND  Final   Special Requests BOTTLES DRAWN AEROBIC AND ANAEROBIC 5CC  Final   Culture NO GROWTH 2 DAYS  Final   Report Status PENDING  Incomplete  Blood Culture (routine x 2)     Status: None (Preliminary result)   Collection Time: 08/01/15  1:17 AM  Result Value Ref Range Status   Specimen Description BLOOD LEFT WRIST  Final   Special Requests BOTTLES DRAWN AEROBIC AND ANAEROBIC 5CC  Final   Culture NO GROWTH 2 DAYS  Final   Report Status PENDING  Incomplete  Urine culture     Status: None   Collection Time: 08/01/15  1:25 AM  Result Value Ref Range Status   Specimen Description URINE, RANDOM  Final   Special Requests NONE  Final   Culture MULTIPLE SPECIES PRESENT, SUGGEST RECOLLECTION  Final   Report Status 08/02/2015 FINAL  Final  MRSA PCR Screening     Status: None   Collection Time: 08/01/15  3:42 AM  Result Value Ref Range Status   MRSA by PCR NEGATIVE NEGATIVE Final    Comment:        The GeneXpert MRSA Assay (FDA approved for NASAL specimens only), is one component of a comprehensive MRSA colonization surveillance program. It is not intended to diagnose MRSA infection nor to guide or monitor treatment for MRSA infections.      Studies:   Recent x-ray studies have been reviewed in detail by the Attending Physician  Scheduled Meds:  Scheduled Meds: . antiseptic oral rinse  7 mL Mouth Rinse BID  . aspirin EC  81 mg Oral QHS  . azithromycin  250 mg Oral Q24H  .  cefixime  260 mg Oral Daily  . dextromethorphan-guaiFENesin  1 tablet Oral BID  . dicyclomine  20 mg Oral BID  . levalbuterol  1.25 mg Nebulization Q6H  . levothyroxine  50 mcg Oral QAC breakfast  . methylPREDNISolone (SOLU-MEDROL) injection  60 mg Intravenous Q24H  . multivitamin with minerals  1 tablet Oral Daily  . pantoprazole  40 mg Oral Daily  . raloxifene  60 mg Oral Daily    Time spent on care of this patient: 35 mins   Lupe Bonner T , MD   [facility] Office  [phone] Pager - Text Page per Shea Evans as per  below:  On-Call/Text Page:      [email or web address]      password [email or web address]  If 7PM-7AM, please contact night-coverage [email or web address] Password [email or web address] 08/04/2015, 11:38 AM   LOS: 3 days

## 2015-08-04 NOTE — Evaluation (Signed)
Occupational Therapy Evaluation Patient Details Name: Teresa Gonzalez MRN: [id number] DOB: 1921/12/02 Today's Date: 08/04/2015    History of Present Illness pt is a 81 y/o with pmhx of peripheral neuropathy, uterine CA s/p radiation, admitted with SOB due to CAP with hypoxia.    Clinical Impression   This 81 yo female admitted with above presents to acute OT with decreased balance and decreased mobility thus affecting her ability to care for herself with minimal help at home pta from a basic ADL standpoint. She will benefit from acute OT with follow up OT at SNF short term or HHOT if she can have 24/7 care (needing A anytime she is up on her feet).    Follow Up Recommendations  SNF;Other (comment);Supervision/Assistance - 24 hour (unless 24/7 S/prn A anytime she is up on her feet is available--if so then [facility])    Equipment Recommendations  None recommended by OT       Precautions / Restrictions Precautions Precautions: Fall Restrictions Weight Bearing Restrictions: No      Mobility Bed Mobility Overal bed mobility: Needs Assistance Bed Mobility: Sit to Supine       Sit to supine: Min guard    Transfers Overall transfer level: Needs assistance Equipment used: 1 person hand held assist Transfers: Sit to/from Stand Sit to Stand: Min assist            Balance Overall balance assessment: Needs assistance Sitting-balance support: Feet supported;No upper extremity supported Sitting balance-Leahy Scale: Good     Standing balance support: Bilateral upper extremity supported Standing balance-Leahy Scale: Poor                              ADL Overall ADL's : Needs assistance/impaired Eating/Feeding: Independent;Sitting   Grooming: Set up;Sitting   Upper Body Bathing: Set up;Sitting   Lower Body Bathing: Minimal assistance;Sit to/from stand   Upper Body Dressing : Set up;Sitting   Lower Body Dressing: Minimal assistance;Sit to/from stand   Toilet  Transfer: Minimal assistance;Stand-pivot;BSC   Toileting- Water quality scientist and Hygiene: Min guard;Sitting/lateral lean               Vision Additional Comments: No change from baseline          Pertinent Vitals/Pain Pain Assessment: No/denies pain     Hand Dominance Right   Extremity/Trunk Assessment Upper Extremity Assessment Upper Extremity Assessment: Overall WFL for tasks assessed           Communication Communication Communication: No difficulties   Cognition Arousal/Alertness: Awake/alert Behavior During Therapy: WFL for tasks assessed/performed Overall Cognitive Status: Within Functional Limits for tasks assessed                                Home Living Family/patient expects to be discharged to:: Private residence Living Arrangements: Spouse/significant other Available Help at Discharge: Family;Personal care attendant;Available 24 hours/day (PCA  5 days a week 8-4:30) Type of Home: House Home Access: Stairs to enter [employer] of Steps: 1   Home Layout: One level     Bathroom Shower/Tub: Occupational psychologist: Standard Bathroom Accessibility: Yes   Home Equipment: Environmental consultant - 4 wheels          Prior Functioning/Environment Level of Independence: Needs assistance  Gait / Transfers Assistance Needed: Independent in home with RW ADL's / Homemaking Assistance Needed: assisted by PCA for some ADLS  Comments: pt walks outside with PCA and runs errands with PCA assist    OT Diagnosis: Generalized weakness   OT Problem List: Decreased strength;Impaired balance (sitting and/or standing);Decreased knowledge of use of DME or AE   OT Treatment/Interventions: Self-care/ADL training;Patient/family education;Balance training;DME and/or AE instruction;Therapeutic activities    OT Goals(Current goals can be found in the care plan section) Acute Rehab OT Goals Patient Stated Goal: get where I can be up again  without someone needing to help me OT Goal Formulation: With patient Time For Goal Achievement: 08/11/15 Potential to Achieve Goals: Good  OT Frequency: Min 2X/week              End of Session    Activity Tolerance: Patient tolerated treatment well Patient left: in bed;with call bell/phone within reach;with bed alarm set   Time: 1348-1405 OT Time Calculation (min): 17 min Charges:  OT General Charges $OT Visit: 1 Procedure OT Evaluation $Initial OT Evaluation Tier I: 1 Procedure  Almon Register [phone] 08/04/2015, 3:29 PM

## 2015-08-04 NOTE — Progress Notes (Addendum)
10/24 Pt copay will be $47 for Eliquis, copay will be$350 for Pradaxa- Xarelto was not covered . Gave pt 30day free eliquis card.pt to talk w husb and da req going for rehab for 1-2 weeks vs home w fam and caregiver she has now from 8:30-4pm and hhpt. Will await decision. Explained to pt how i would help w hhpt if needed. Will cont to follow.

## 2015-08-04 NOTE — Care Management Important Message (Signed)
Important Message  Patient Details  Name: Teresa Gonzalez MRN: [id number] Date of Birth: 15-Apr-1922   Medicare Important Message Given:  Yes-second notification given    Delorse Lek 08/04/2015, 12:15 PM

## 2015-08-04 NOTE — Progress Notes (Signed)
ANTICOAGULATION CONSULT NOTE - Follow Up Consult  Pharmacy Consult for heparin Indication: atrial fibrillation   Patient Measurements: Height: 5\' 5"  (165.1 cm) Weight: 112 lb 8 oz (51.03 kg) IBW/kg (Calculated) : 57   Vital Signs: Temp: 97.6 F (36.4 C) (10/24 1132) Temp Source: Oral (10/24 1132) BP: 148/71 mmHg (10/24 1132) Pulse Rate: 91 (10/24 1132)  Labs:  Recent Labs  08/02/15 0257 08/02/15 1700 08/02/15 2331 08/03/15 0443 08/04/15 0255  HGB 11.2*  --   --  11.2* 11.6*  HCT 33.4*  --   --  33.1* 34.7*  PLT 195  --   --  222 260  HEPARINUNFRC 0.34  --   --  0.37 0.30  CREATININE 0.50  --   --  0.55 0.72  TROPONINI  --  0.10* 0.07* 0.05*  --     Estimated Creatinine Clearance: 36.9 mL/min (by C-G formula based on Cr of 0.72).   Medications:  Scheduled:  . antiseptic oral rinse  7 mL Mouth Rinse BID  . aspirin EC  81 mg Oral QHS  . azithromycin  250 mg Oral Q24H  . cefixime  260 mg Oral Daily  . dextromethorphan-guaiFENesin  1 tablet Oral BID  . dicyclomine  20 mg Oral BID  . levalbuterol  1.25 mg Nebulization Q6H  . levothyroxine  50 mcg Oral QAC breakfast  . metoprolol tartrate  25 mg Oral BID  . multivitamin with minerals  1 tablet Oral Daily  . pantoprazole  40 mg Oral Daily  . raloxifene  60 mg Oral Daily   Infusions:  . sodium chloride 10 mL/hr at 08/04/15 1225  . heparin 800 Units/hr (08/04/15 0800)    Assessment: 81yo female admitted on 07/31/2015 c/o SOB, found to be in new-onset Afib w/ RVR (CHADSVASC= 5; HTN, age, gender, HF) on heparin drip. Heparin is at 800 units/hr and level remains therapeutic at 0.3. H/H remains low but stable, plt wnl with no reported significant s/s bleeding.  Follow up plans to change to oral AC  Goal of Therapy:  Heparin level 0.3-0.7 units/ml Monitor platelets by anticoagulation protocol: Yes   Plan:  - Continue heparin gtt at 800units/hr - Daily HL/CBC - Will follow long-term anticoagulation  plans   Bonnita Nasuti Pharm.D. CPP, BCPS Clinical Pharmacist [phone] 08/04/2015 3:18 PM

## 2015-08-04 NOTE — Progress Notes (Signed)
Physical Therapy Treatment Patient Details Name: Teresa Gonzalez MRN: [id number] DOB: 05/06/1922 Today's Date: 08/04/2015    History of Present Illness pt is a 81 y/o with pmhx of peripheral neuropathy, uterine CA s/p radiation, admitted with SOB due to CAP with hypoxia.     PT Comments    Pt functioning at [facility] and at significant falls risk. Pt remains to have irregular HR during session however asymptomatic. HR would spike to 163 and then return to 105. In order for safe d/c home pt requires 24/7 ASSIST not just supervision. Pt does have an aide 830-4 M-F but her husband doesn't get home until 5-530. When asked if spouse can assist her she reports "well maybe I should go to rehab." Unclear if spouse is capable of providing assist patient needs. Patient to talk to daughter and spouse to see if they can provide 24/7 assist if not PT recommends pt go to [facility]SNF to achieve supervision level of function for safe transition home. PT to re-assess tomorrow.  Follow Up Recommendations  SNF;Supervision/Assistance - 24 hour (may be able to go home if 24/7 available.)     Equipment Recommendations  None recommended by PT    Recommendations for Other Services       Precautions / Restrictions Precautions Precautions: Fall Restrictions Weight Bearing Restrictions: No    Mobility  Bed Mobility               General bed mobility comments: pt up in chair  Transfers Overall transfer level: Needs assistance Equipment used: Rolling walker (2 wheeled) Transfers: Sit to/from Stand Sit to Stand: Min assist         General transfer comment: v/c's to push up from chair, minA to steady pt during transition of hands from chair to RW  Ambulation/Gait Ambulation/Gait assistance: Min assist Ambulation Distance (Feet): 125 Feet Assistive device: Rolling walker (2 wheeled) Gait Pattern/deviations: Step-through pattern;Decreased stride length;Shuffle;Narrow base of support Gait velocity:  slow Gait velocity interpretation: Below normal speed for age/gender General Gait Details: pt with couple of episodes of instability but not overt LOB. pt unsteady with RW requiring min guard to steady pt. pt significantly decreased pace during turns, minimal foot clearance. when asked how this compares to her ambulation PTA pt reports "oh this is so much slowerTourist information centre manager [place] (Stroke Patients Only)       Balance                                    Cognition Arousal/Alertness: Awake/alert Behavior During Therapy: WFL for tasks assessed/performed Overall Cognitive Status: Within Functional Limits for tasks assessed                      Exercises      General Comments        Pertinent Vitals/Pain Pain Assessment: No/denies pain    Home Living                      Prior Function            PT Goals (current goals can now be found in the care plan section) Acute Rehab PT Goals Patient Stated Goal: go back to how I was Progress towards PT goals: Progressing toward goals    Frequency  Min  3X/week    PT Plan Discharge plan needs to be updated    Co-evaluation             End of Session Equipment Utilized During Treatment: Gait belt Activity Tolerance: Patient tolerated treatment well Patient left: in chair;with call bell/phone within reach;with family/visitor present     Time: 1211-1233 PT Time Calculation (min) (ACUTE ONLY): 22 min  Charges:  $Gait Training: 8-22 mins                    G Codes:      Kingsley Callander 08/04/2015, 12:57 PM   Kittie Plater, PT, DPT Pager #: [phone] Office #: [phone]

## 2015-08-05 LAB — CBC
HEMATOCRIT: 33.6 % — AB (ref 36.0–46.0)
HEMOGLOBIN: 11.1 g/dL — AB (ref 12.0–15.0)
MCH: 30.2 pg (ref 26.0–34.0)
MCHC: 33 g/dL (ref 30.0–36.0)
MCV: 91.3 fL (ref 78.0–100.0)
Platelets: 266 10*3/uL (ref 150–400)
RBC: 3.68 MIL/uL — ABNORMAL LOW (ref 3.87–5.11)
RDW: 13.6 % (ref 11.5–15.5)
WBC: 10.4 10*3/uL (ref 4.0–10.5)

## 2015-08-05 LAB — BASIC METABOLIC PANEL
ANION GAP: 10 (ref 5–15)
BUN: 11 mg/dL (ref 6–20)
CO2: 25 mmol/L (ref 22–32)
Calcium: 8.7 mg/dL — ABNORMAL LOW (ref 8.9–10.3)
Chloride: 103 mmol/L (ref 101–111)
Creatinine, Ser: 0.67 mg/dL (ref 0.44–1.00)
GFR calc Af Amer: >60 mL/min (ref >60)
Glucose, Bld: 106 mg/dL — ABNORMAL HIGH (ref 65–99)
POTASSIUM: 3.6 mmol/L (ref 3.5–5.1)
SODIUM: 138 mmol/L (ref 135–145)

## 2015-08-05 LAB — HEPARIN LEVEL (UNFRACTIONATED): HEPARIN UNFRACTIONATED: 0.22 [IU]/mL — AB (ref 0.30–0.70)

## 2015-08-05 LAB — MAGNESIUM: MAGNESIUM: 2 mg/dL (ref 1.7–2.4)

## 2015-08-05 MED ORDER — APIXABAN 2.5 MG PO TABS
2.5000 mg | ORAL_TABLET | Freq: Two times a day (BID) | ORAL | Status: DC
  Administered 2015-08-05 – 2015-08-06 (×3): 2.5 mg via ORAL
  Filled 2015-08-05 (×3): qty 1

## 2015-08-05 MED ORDER — RIVAROXABAN 15 MG PO TABS
15.0000 mg | ORAL_TABLET | Freq: Every day | ORAL | Status: DC
  Filled 2015-08-05: qty 1

## 2015-08-05 MED ORDER — LOPERAMIDE HCL 2 MG PO CAPS
4.0000 mg | ORAL_CAPSULE | Freq: Four times a day (QID) | ORAL | Status: DC | PRN
  Administered 2015-08-05 – 2015-08-06 (×3): 4 mg via ORAL
  Filled 2015-08-05 (×3): qty 2

## 2015-08-05 NOTE — Clinical Social Work Placement (Signed)
   CLINICAL SOCIAL WORK PLACEMENT  NOTE  Date:  08/05/2015  Patient Details  Name: DEVA RON MRN: [id number] Date of Birth: July 31, 1922  Clinical Social Work is seeking post-discharge placement for this patient at the [facility] level of care (*CSW will initial, date and re-position this form in  chart as items are completed):  Yes   Patient/family provided with [facility] Work Department's list of facilities offering this level of care within the geographic area requested by the patient (or if unable, by the patient's family).  Yes   Patient/family informed of their freedom to choose among providers that offer the needed level of care, that participate in Medicare, Medicaid or managed care program needed by the patient, have an available bed and are willing to accept the patient.  Yes   Patient/family informed of [facility]'s ownership interest in [facility] and [facility], as well as of the fact that they are under no obligation to receive care at these facilities.  PASRR submitted to EDS on 08/05/15     PASRR number received on 08/05/15     Existing PASRR number confirmed on       FL2 transmitted to all facilities in geographic area requested by pt/family on 08/05/15     FL2 transmitted to all facilities within larger geographic area on       Patient informed that his/her managed care company has contracts with or will negotiate with certain facilities, including the following:            Patient/family informed of bed offers received.  Patient chooses bed at       Physician recommends and patient chooses bed at      Patient to be transferred to   on  .  Patient to be transferred to facility by       Patient family notified on   of transfer.  Name of family member notified:        PHYSICIAN       Additional Comment:    _______________________________________________ Liz Beach MSW, [place], [place], [phone]

## 2015-08-05 NOTE — Progress Notes (Signed)
[place] TEAM 1 - Stepdown/ICU TEAM Progress Note  Teresa Gonzalez GGY:[id number] DOB: August 22, 1922 DOA: 07/31/2015 PCP: [name], MD  Admit HPI / Brief Narrative: 81 y.o. WF PMHx depression, insomnia, hypothyroidism, peripheral neuropathy, uterine cancer S/P XRT, S/P small bowel resection   as outlined below. She was brought to [facility] ED during early AM hours 10/21 due to SOB and "just feeling bad". She apparently has "felt bad" for the past few days with no specific complaint. On night of 10/20 while trying to go to sleep, she began to experience SOB and palpitations. EMS was dispatched and on their arrival she was found to be in A.fib with RVR (HR in 180's). She was given albuterol, atrovent, adenosine x 2, 10mg  Cardizem, and 450cc NS bolus.  On ED arrival, she was hypotensive; however, this responded well to IVF's. She was placed on BiPAP due to hypoxia and increased WOB. Per pt, BiPAP has helped relieve her sensation of SOB. CXR was obtained and revealed RUL infiltrate. She had 1 documented rectal temp of 101.60F and initial lactate was elevated at 3.38. PCCM was called for admission.  She has not had any fevers/chills/sweats, chest pain, cough, wheezing, N/V/D, abdominal pain, myalgias. No recent travel or exposure to known sick contacts. Her sister does live in a nursing home and she does visit her, but she had not had any overnight stays in health care facility's and has not been hospitalized recently.  At the time of my assessment, I feel that she can come off of BiPAP; however, she asks if she can continue it until morning given the fact that she feels more comfortable with it and state her SOB has improved with it.  HPI/Subjective: 10/25,  A/O 4, negative SOB, negative CP, negative N/V. States does not use home O2. Understands she needs to go to SNF to regain strength    Assessment/Plan: Acute respiratory failure with hypoxia /CAP  - bilateral infiltrates, R > L. -Empiric  abx  for 7 days. -Sputum cultures, blood cultures. -Supplemental O2 as needed when not on BiPAP. -Xopenex QID PRN -Mucinex DM -Flutter valve -PT/OT; recommends SNF -Ambulatory SPO2 -Follow-up with Dr. Shon Baton in 2 weeks for acute respiratory failure with hypoxia, paroxysmal atrial fibrillation, pulmonary hypertension  Paroxysmal Atrial Fibrillation (new Dx) -Currently in NSR -LBBB -Echocardiogram ; pulmonary hypertension see results below -Strict in and out since admission; + 2.5 L -Daily weight; 10/23 bed weight= 49.7 kg -Start Eliquis  Pulmonary hypertension/MV regurgitation -Continue metoprolol 25 mg BID  Hyponatremia  - Resolved  Hypokalemia. -Potassium goal> 4  Hypomagnesemia -Magnesium goal> 2  Hypothyroidism. -Continue synthroid 50 g daily . -TSH WNL.  ? Irritable bowel with fecal incontinence. -Continue outpatient PPI, bentyl, loperamide.  Osteoporosis. Continue outpatient raloxifene 60 mg daily.    Code Status: DO NOT RESUSCITATE Family Communication: no family present at time of exam Disposition Plan: Resolution CAP; CSW searching for SNF    Consultants: Dr.Wesam Kathryne Sharper [facility])   Procedure/Significant Events: CXR 10/21 >>> RUL infiltrate c/w CAP. Mild left basilar opacity likely also reflects PNA. 10/23 echocardiogram;- Left ventricle: moderate LVH. Focal basal hypertrophy.  -LVEF= 50% to 55%. - Mitral valve: moderate regurgitation. - Pulmonary arteries: PA peak pressure: 34 mm Hg (S).  Culture 10/21 blood left hand/wrist NGTD 10/21 urine multiple species present 10/21 MRSA by PCR negative 10/22 urine pending   Antibiotics: Azithromycin 10/22>> Ceftriaxone 10/21>> stop 10/23 Cefixime 10/24>>   DVT prophylaxis: Eliquis   Devices  LINES / TUBES:      Continuous Infusions: . sodium chloride 10 mL/hr at 08/04/15 1225    Objective: VITAL SIGNS: Temp: 98 F (36.7 C) (10/25 1617) Temp Source: Oral (10/25 1617) BP:  168/84 mmHg (10/25 1617) Pulse Rate: 76 (10/25 1617) SPO2; FIO2:   Intake/Output Summary (Last 24 hours) at 08/05/15 1955 Last data filed at 08/05/15 1600  Gross per 24 hour  Intake 976.43 ml  Output   2025 ml  Net -1048.57 ml     Exam: General: A/O 4, negative acute respiratory distress  Eyes: Negative headache, eye pain, double vision,negative scleral hemorrhage ENT: Negative Runny nose, negative gingival bleeding, Neck:  Negative scars, masses, torticollis, lymphadenopathy, JVD Lungs: clear to auscultation bilateral, negative wheezes, negative crackles Cardiovascular: Regular rate and rhythm without murmur gallop or rub normal S1 and S2 Abdomen:negative abdominal pain, nondistended, positive soft, bowel sounds, no rebound, no ascites, no appreciable mass Extremities: No significant cyanosis, clubbing, or edema bilateral lower extremities Psychiatric:  Negative depression, negative anxiety, negative fatigue, negative mania  Neurologic:  Cranial nerves II through XII intact, tongue/uvula midline, all extremities muscle strength 5/5, sensation intact throughout, negative dysarthria, negative expressive aphasia, negative receptive aphasia.   Data Reviewed: Basic Metabolic Panel:  Recent Labs Lab 08/01/15 0445 08/02/15 0257 08/03/15 0443 08/04/15 0255 08/05/15 0225  NA 127* 129* 132* 137 138  K 3.9 4.2 3.9 4.0 3.6  CL 98* 101 101 104 103  CO2 18* 21* 25 24 25   GLUCOSE 138* 100* 157* 154* 106*  BUN 9 7 6 11 11   CREATININE 0.75 0.50 0.55 0.72 0.67  CALCIUM 7.7* 8.1* 8.2* 8.9 8.7*  MG 2.7* 1.8 1.5* 1.6* 2.0  PHOS 2.6 2.5  --   --   --    Liver Function Tests:  Recent Labs Lab 08/03/15 0443 08/04/15 0255  AST 28 39  ALT 20 26  ALKPHOS 61 61  BILITOT 0.4 0.5  PROT 5.2* 5.5*  ALBUMIN 2.6* 2.8*   No results for input(s): LIPASE, AMYLASE in the last 168 hours. No results for input(s): AMMONIA in the last 168 hours. CBC:  Recent Labs Lab 08/01/15 0008  08/01/15 0445 08/02/15 0257 08/03/15 0443 08/04/15 0255 08/05/15 0225  WBC 8.5 7.9 5.8 3.7* 7.6 10.4  NEUTROABS 7.4  --   --  2.9 6.4  --   HGB 12.2 11.4* 11.2* 11.2* 11.6* 11.1*  HCT 36.6 33.5* 33.4* 33.1* 34.7* 33.6*  MCV 91.3 92.3 91.5 91.2 90.6 91.3  PLT 208 139* 195 222 260 266   Cardiac Enzymes:  Recent Labs Lab 08/01/15 0445 08/01/15 1128 08/02/15 1700 08/02/15 2331 08/03/15 0443  TROPONINI 0.27* 0.29* 0.10* 0.07* 0.05*   BNP (last 3 results)  Recent Labs  08/01/15 0008  BNP 273.1*    ProBNP (last 3 results) No results for input(s): PROBNP in the last 8760 hours.  CBG: No results for input(s): GLUCAP in the last 168 hours.  Recent Results (from the past 240 hour(s))  Blood Culture (routine x 2)     Status: None (Preliminary result)   Collection Time: 08/01/15  1:13 AM  Result Value Ref Range Status   Specimen Description BLOOD LEFT HAND  Final   Special Requests BOTTLES DRAWN AEROBIC AND ANAEROBIC 5CC  Final   Culture NO GROWTH 4 DAYS  Final   Report Status PENDING  Incomplete  Blood Culture (routine x 2)     Status: None (Preliminary result)   Collection Time: 08/01/15  1:17 AM  Result Value Ref Range Status   Specimen Description BLOOD LEFT WRIST  Final   Special Requests BOTTLES DRAWN AEROBIC AND ANAEROBIC 5CC  Final   Culture NO GROWTH 4 DAYS  Final   Report Status PENDING  Incomplete  Urine culture     Status: None   Collection Time: 08/01/15  1:25 AM  Result Value Ref Range Status   Specimen Description URINE, RANDOM  Final   Special Requests NONE  Final   Culture MULTIPLE SPECIES PRESENT, SUGGEST RECOLLECTION  Final   Report Status 08/02/2015 FINAL  Final  MRSA PCR Screening     Status: None   Collection Time: 08/01/15  3:42 AM  Result Value Ref Range Status   MRSA by PCR NEGATIVE NEGATIVE Final    Comment:        The GeneXpert MRSA Assay (FDA approved for NASAL specimens only), is one component of a comprehensive MRSA  colonization surveillance program. It is not intended to diagnose MRSA infection nor to guide or monitor treatment for MRSA infections.   Culture, Urine     Status: None   Collection Time: 08/02/15 11:40 PM  Result Value Ref Range Status   Specimen Description URINE, CLEAN CATCH  Final   Special Requests NONE  Final   Culture NO GROWTH 1 DAY  Final   Report Status 08/04/2015 FINAL  Final     Studies:  Recent x-ray studies have been reviewed in detail by the Attending Physician  Scheduled Meds:  Scheduled Meds: . antiseptic oral rinse  7 mL Mouth Rinse BID  . apixaban  2.5 mg Oral BID  . aspirin EC  81 mg Oral QHS  . azithromycin  250 mg Oral Q24H  . cefixime  260 mg Oral Daily  . dextromethorphan-guaiFENesin  1 tablet Oral BID  . dicyclomine  20 mg Oral BID  . levothyroxine  50 mcg Oral QAC breakfast  . metoprolol tartrate  25 mg Oral BID  . multivitamin with minerals  1 tablet Oral Daily  . pantoprazole  40 mg Oral Daily  . raloxifene  60 mg Oral Daily    Time spent on care of this patient: 40 mins   WOODS, Geraldo Docker , MD  [facility] Office  [phone] Pager [phone]  On-Call/Text Page:      [email or web address]      password [email or web address]  If 7PM-7AM, please contact night-coverage [email or web address] Password [email or web address] 08/05/2015, 7:55 PM   LOS: 4 days   Care during the described time interval was provided by me .  I have reviewed this patient's available data, including medical history, events of note, physical examination, and all test results as part of my evaluation. I have personally reviewed and interpreted all radiology studies.   Dia Crawford, MD [phone] Pager

## 2015-08-05 NOTE — NC FL2 (Addendum)
[place] LEVEL OF CARE SCREENING TOOL     IDENTIFICATION  Patient Name: Teresa Gonzalez Birthdate: 1921/11/21 Sex: female Admission Date (Current Location): 07/31/2015  [facility] and [place] Number: Therapist, sports and Address:  [facility] [address] Provider Number: [id number]   Attending Physician Name and Address:  Allie Bossier, MD  Relative Name and Address:  Rip Harbour    Current Level of Care: Hospital Recommended Level of Care: [place] Prior Approval Number:    Date Approved/Denied:   PASRR Number: [id number]  Discharge Plan: SNF    Current Diagnoses: Patient Active Problem List   Diagnosis Date Noted  . Atrial fibrillation ([place])   . Acute respiratory failure with hypoxia ([place])   . Hyponatremia   . Hypokalemia   . Other specified hypothyroidism   . Osteoporosis   . CAP (community acquired pneumonia) 08/01/2015  . Sepsis ([place])   . Chronic atrial fibrillation ([place])   . Metabolic acidosis   . SBO (small bowel obstruction) ([place]) 10/20/2013  . Abnormality of gait 01/11/2012  . Disturbance of skin sensation 01/11/2012  . Degeneration of lumbar or lumbosacral intervertebral disc 01/11/2012  . Lumbosacral root lesions, not elsewhere classified 01/11/2012  . Polyneuropathy in other diseases classified elsewhere ([place]) 01/11/2012    DISORIENTED AMBULATORY STATUS BLADDER BOWEL   (Oriented x4) Ambulatory Incontinent Continent  INAPPROPRIATE BEHAVIOR FUNCTIONAL LIMITATIONS COMMUNICATION OF NEEDS RESPIRATION      Verbally Normal  PERSONAL CARE ASSISTANCE ACTIVITIES/SOCIAL SKIN NUTRITION STATUS  Bathing, Dressing Active Normal Diet (Heart Healthy)  PHYSICIAN VISITS NEUROLOGICAL            SPECIAL CARE FACTORS FREQUENCY  PT (By licensed PT) ([place] is a DNR)     PT Frequency: 5x/week             Current Medications (08/05/2015): Current Facility-Administered Medications  Medication Dose Route Frequency Provider Last Rate Last  Dose  . 0.9 %  sodium chloride infusion   Intravenous Continuous Cherene Altes, MD 10 mL/hr at 08/04/15 1225    . antiseptic oral rinse (CPC / CETYLPYRIDINIUM CHLORIDE 0.05%) solution 7 mL  7 mL Mouth Rinse BID Raylene Miyamoto, MD   7 mL at 08/04/15 2159  . aspirin EC tablet 81 mg  81 mg Oral QHS Rahul P Desai, PA-C   81 mg at 08/04/15 2155  . azithromycin (ZITHROMAX) tablet 250 mg  250 mg Oral Q24H Allie Bossier, MD   250 mg at 08/04/15 2012  . cefixime (SUPRAX) 100 MG/5ML suspension 260 mg  260 mg Oral Daily Allie Bossier, MD   260 mg at 08/04/15 0917  . dextromethorphan-guaiFENesin (MUCINEX DM) 30-600 MG per 12 hr tablet 1 tablet  1 tablet Oral BID Allie Bossier, MD   1 tablet at 08/04/15 2154  . dicyclomine (BENTYL) tablet 20 mg  20 mg Oral BID Rahul P Desai, PA-C   20 mg at 08/04/15 0917  . famotidine (PEPCID) tablet 10 mg  10 mg Oral Daily PRN Rahul P Desai, PA-C      . heparin ADULT infusion 100 units/mL (25000 units/250 mL)  900 Units/hr Intravenous Continuous Laren Everts, RPH 9 mL/hr at 08/05/15 0411 900 Units/hr at 08/05/15 0411  . levalbuterol (XOPENEX) nebulizer solution 1.25 mg  1.25 mg Nebulization Q6H PRN Cherene Altes, MD      . levothyroxine (SYNTHROID, LEVOTHROID) tablet 50 mcg  50 mcg Oral QAC breakfast Rush Farmer, MD   50  mcg at 08/05/15 0811  . loperamide (IMODIUM) capsule 2 mg  2 mg Oral 5 X Daily PRN Rahul P Desai, PA-C   2 mg at 08/04/15 1829  . metoprolol tartrate (LOPRESSOR) tablet 25 mg  25 mg Oral BID Cherene Altes, MD   25 mg at 08/04/15 2155  . multivitamin with minerals tablet 1 tablet  1 tablet Oral Daily Rahul Dianna Rossetti, PA-C   1 tablet at 08/04/15 0916  . pantoprazole (PROTONIX) EC tablet 40 mg  40 mg Oral Daily Rahul P Desai, PA-C   40 mg at 08/04/15 0916  . raloxifene (EVISTA) tablet 60 mg  60 mg Oral Daily Rahul P Desai, PA-C   60 mg at 08/04/15 8916   Do not use this list as official medication orders. Please verify with discharge  summary.  Discharge Medications:   Medication List    ASK your doctor about these medications        aspirin 81 MG EC tablet  Take 81 mg by mouth at bedtime. Swallow whole.     CALTRATE 600 PO  Take 600 mg by mouth daily.     dicyclomine 20 MG tablet  Commonly known as:  BENTYL  Take 1 tablet (20 mg total) by mouth 2 (two) times daily.     lisinopril 10 MG tablet  Commonly known as:  PRINIVIL,ZESTRIL  Take 10 mg by mouth daily.     loperamide 2 MG capsule  Commonly known as:  IMODIUM  Take 1 capsule (2 mg total) by mouth 5 (five) times daily as needed for diarrhea or loose stools.     multivitamin with minerals tablet  Take 1 tablet by mouth daily.     NON FORMULARY  Take 1 capsule by mouth daily. Juice Plus. Food Supplement.     omeprazole 40 MG capsule  Commonly known as:  PRILOSEC  Take 40 mg by mouth daily.     ondansetron 4 MG tablet  Commonly known as:  ZOFRAN  Take 1 tablet (4 mg total) by mouth every 6 (six) hours.     raloxifene 60 MG tablet  Commonly known as:  EVISTA  Take 60 mg by mouth daily.     SYNTHROID 50 MCG tablet  Generic drug:  levothyroxine  Take 50 mcg by mouth daily.     VITAMIN B1-B12 IJ  Inject 1 mL into the skin every 21 ( twenty-one) days.     Vitamin D (Ergocalciferol) 50000 UNITS Caps capsule  Commonly known as:  DRISDOL  Take 50,000 Units by mouth every 7 (seven) days.        Relevant Imaging Results:  Relevant Lab Results:  Recent Labs    Additional Information Allergic to Codeine and Penicillins  Liz Beach MSW, LCSW, [place], [phone]    Cherene Altes, MD [facility] For Consults/Admissions - Flow Manager - [phone] Office  [phone]

## 2015-08-05 NOTE — Progress Notes (Signed)
ANTICOAGULATION CONSULT NOTE - Follow Up Consult  Pharmacy Consult for heparin Indication: atrial fibrillation   Labs:  Recent Labs  08/02/15 1700 08/02/15 2331  08/03/15 0443 08/04/15 0255 08/05/15 0225  HGB  --   --   < > 11.2* 11.6* 11.1*  HCT  --   --   --  33.1* 34.7* 33.6*  PLT  --   --   --  222 260 266  HEPARINUNFRC  --   --   --  0.37 0.30 0.22*  CREATININE  --   --   --  0.55 0.72  --   TROPONINI 0.10* 0.07*  --  0.05*  --   --   < > = values in this interval not displayed.    Assessment: 81yo female now subtherapeutic on heparin after several levels at low end of goal.  Goal of Therapy:  Heparin level 0.3-0.7 units/ml   Plan:  Will increase heparin gtt by 2 units/kg/hr to 900 units/hr and check level in [place], PharmD, BCPS  08/05/2015,3:35 AM

## 2015-08-05 NOTE — Progress Notes (Signed)
Ambulated patient 250 ft.   Pulse oximetry pre-ambulation: 95% Pulse oximetry during ambulation: 92% Pulse oximetry post-ambulation: 95%  Vicie Mutters, RN

## 2015-08-05 NOTE — Progress Notes (Signed)
Physical Therapy Treatment Patient Details Name: Teresa Gonzalez MRN: [id number] DOB: [birth year] Today's Date: 08/05/2015    History of Present Illness pt is a 81 y/o with pmhx of peripheral neuropathy, uterine CA s/p radiation, admitted with SOB due to CAP with hypoxia.     PT Comments    Pt admitted with above diagnosis. Pt currently with functional limitations due to balance and endurance deficits. Pt progressing.  Still needs NH to reach prior functional level.  Pt agreeable.   Pt will benefit from skilled PT to increase their independence and safety with mobility to allow discharge to the venue listed below.    Follow Up Recommendations  SNF;Supervision/Assistance - 24 hour     Equipment Recommendations  None recommended by PT    Recommendations for Other Services       Precautions / Restrictions Precautions Precautions: Fall Restrictions Weight Bearing Restrictions: No    Mobility  Bed Mobility                  Transfers Overall transfer level: Needs assistance Equipment used: Rolling walker (2 wheeled) Transfers: Sit to/from Stand Sit to Stand: Min guard         General transfer comment: pt able to push up from chair with cues for hand placement with incr time and 2 attempts.   Ambulation/Gait Ambulation/Gait assistance: Min guard;Min assist Ambulation Distance (Feet): 250 Feet Assistive device: Rolling walker (2 wheeled) Gait Pattern/deviations: Step-through pattern;Decreased stride length;Drifts right/left;Narrow base of support Gait velocity: slow Gait velocity interpretation: Below normal speed for age/gender General Gait Details: pt with couple of episodes of instability but not overt LOB. pt unsteady with RW requiring min guard to steady pt. pt significantly decreased pace during turns, minimal foot clearance.   Stairs            Wheelchair Mobility    Modified Rankin (Stroke Patients Only)       Balance Overall balance  assessment: Needs assistance Sitting-balance support: No upper extremity supported;Feet supported Sitting balance-Leahy Scale: Good Sitting balance - Comments: can reach outside BOS for objects   Standing balance support: Bilateral upper extremity supported;During functional activity Standing balance-Leahy Scale: Poor Standing balance comment: relies on RW                    Cognition Arousal/Alertness: Awake/alert Behavior During Therapy: WFL for tasks assessed/performed Overall Cognitive Status: Within Functional Limits for tasks assessed                      Exercises General Exercises - Upper Extremity Shoulder Flexion: AROM;Both;5 reps;Seated General Exercises - Lower Extremity Ankle Circles/Pumps: AROM;Both;10 reps;Seated Long Arc Quad: AROM;Both;10 reps;Seated Hip Flexion/Marching: AROM;Both;10 reps;Seated    General Comments        Pertinent Vitals/Pain Pain Assessment: No/denies pain  HR 78-89 bpm.  O2 95% on RA    Home Living                      Prior Function            PT Goals (current goals can now be found in the care plan section) Progress towards PT goals: Progressing toward goals    Frequency  Min 3X/week    PT Plan Current plan remains appropriate    Co-evaluation             End of Session Equipment Utilized During Treatment: Gait belt Activity Tolerance: Patient tolerated treatment well  Patient left: in chair;with call bell/phone within reach     Time: 1100-1112 PT Time Calculation (min) (ACUTE ONLY): 12 min  Charges:  $Gait Training: 8-22 mins                    G Codes:      Denice Paradise 08/27/2015, 12:28 PM  Allard Lightsey,PT Acute Rehabilitation [phone] (pager)

## 2015-08-05 NOTE — Progress Notes (Addendum)
ANTICOAGULATION CONSULT NOTE - Follow Up Consult  Pharmacy Consult for heparin>>apixaban Indication: atrial fibrillation   Patient Measurements: Height: 5\' 5"  (165.1 cm) Weight: 113 lb 1.6 oz (51.302 kg) IBW/kg (Calculated) : 57   Vital Signs: Temp: 97.6 F (36.4 C) (10/25 0729) Temp Source: Oral (10/25 0729) BP: 135/73 mmHg (10/25 0729) Pulse Rate: 87 (10/25 0729)  Labs:  Recent Labs  08/02/15 1700 08/02/15 2331  08/03/15 0443 08/04/15 0255 08/05/15 0225  HGB  --   --   < > 11.2* 11.6* 11.1*  HCT  --   --   --  33.1* 34.7* 33.6*  PLT  --   --   --  222 260 266  HEPARINUNFRC  --   --   --  0.37 0.30 0.22*  CREATININE  --   --   --  0.55 0.72 0.67  TROPONINI 0.10* 0.07*  --  0.05*  --   --   < > = values in this interval not displayed.  Estimated Creatinine Clearance: 37.1 mL/min (by C-G formula based on Cr of 0.67).   Assessment: 81yo female admitted on 07/31/2015 c/o SOB, found to be in new-onset Afib w/ RVR (CHADSVASC= 5; HTN, age, gender, HF) currently on heparin drip.  Heparin is at 900 units/hr and level just below goal with am check. H/H remains low but stable, plt wnl with no reported significant s/s bleeding.    New orders received this am to change to apixaban, will adjust to 2.5mg  bid given age/wt.  Goal of Therapy:  Heparin level 0.3-0.7 units/ml Monitor platelets by anticoagulation protocol: Yes   Plan:  - D/c heparin - Start apixaban 2.5mg  bid  Erin Hearing PharmD., BCPS Clinical Pharmacist Pager [phone] 08/05/2015 8:40 AM

## 2015-08-05 NOTE — Clinical Social Work Note (Signed)
Clinical Social Work Assessment  Patient Details  Name: Teresa Gonzalez MRN: [id number] Date of Birth: 1922-05-30  Date of referral:  08/05/15               Reason for consult:  Facility Placement                Permission sought to share information with:  Facility Sport and exercise psychologist, Family Supports Permission granted to share information::  Yes, Verbal Permission Granted  Name::     Rilla Buckman  Agency::  SNF  Relationship::  Husband  Contact Information:  [phone]  Housing/Transportation Living arrangements for the past 2 months:  [place] of Information:  Patient, Spouse Patient Interpreter Needed:  None Criminal Activity/Legal Involvement Pertinent to Current Situation/Hospitalization:  No - Comment as needed Significant Relationships:  Adult Children, Spouse Lives with:  Spouse Do you feel safe going back to the place where you live?  Yes Need for family participation in patient care:  Yes (Comment)  Care giving concerns:  Patient lives at home with husband. Care needed for the patient exceeds the capabilities for care received at the home.   Social Worker assessment / plan:  BSW intern met with patient and patient husband at bedside to complete assessment. Patient stated that she came to the hospital from home with shortness of breath and chest pains. Patient states that her and her husband live at home. Patient states that she has a daughter that lives in [place]. BSW intern spoke with patient about SNF as an option at discharge. Patient was very receptive to the idea of going to SNF for rehab. Patient stated that she wants to get well so she can go home. BSW intern asked patient if she had a preferred facility, and patient stated that she would like to go to [facility] because that facility is close to her house. Social worker to continue to assist as needed.  Employment status:  Retired Nurse, adult PT Recommendations:   [place] / Referral to community resources:  [place]  Patient/Family's Response to care:  Patient and husband were both very pleased with the care that the patient has been receiving while she has been in the hospital.  Patient/Family's Understanding of and Emotional Response to Diagnosis, Current Treatment, and Prognosis:  Patient and patient husband understand that patient needs rehab to gain strength to be able to return home. Patient and patient husband understand that rehab involves going to SNF before patient will be ready to go home. Patient and patient husband are hopeful of patient's upcoming discharge. Patient and patient husband are understanding of post discharge needs.  Emotional Assessment Appearance:  Appears stated age, Well-Groomed Attitude/Demeanor/Rapport:  Other (Pleasant) Affect (typically observed):  Accepting, Hopeful, Pleasant, Appropriate Orientation:  Oriented to Self, Oriented to Place, Oriented to  Time, Oriented to Situation Alcohol / Substance use:  Not Applicable Psych involvement (Current and /or in the community):  No (Comment)  Discharge Needs  Concerns to be addressed:  Discharge Planning Concerns Readmission within the last 30 days:  No Current discharge risk:  None Barriers to Discharge:  Continued Medical Work up   [employer], [phone]

## 2015-08-06 DIAGNOSIS — I480 Paroxysmal atrial fibrillation: Secondary | ICD-10-CM | POA: Diagnosis not present

## 2015-08-06 DIAGNOSIS — I4891 Unspecified atrial fibrillation: Secondary | ICD-10-CM | POA: Diagnosis not present

## 2015-08-06 DIAGNOSIS — Z5189 Encounter for other specified aftercare: Secondary | ICD-10-CM | POA: Diagnosis not present

## 2015-08-06 DIAGNOSIS — J9601 Acute respiratory failure with hypoxia: Secondary | ICD-10-CM | POA: Diagnosis not present

## 2015-08-06 DIAGNOSIS — J189 Pneumonia, unspecified organism: Secondary | ICD-10-CM | POA: Diagnosis not present

## 2015-08-06 DIAGNOSIS — E876 Hypokalemia: Secondary | ICD-10-CM | POA: Diagnosis not present

## 2015-08-06 DIAGNOSIS — I272 Other secondary pulmonary hypertension: Secondary | ICD-10-CM | POA: Diagnosis not present

## 2015-08-06 DIAGNOSIS — M810 Age-related osteoporosis without current pathological fracture: Secondary | ICD-10-CM

## 2015-08-06 LAB — CULTURE, BLOOD (ROUTINE X 2)
CULTURE: NO GROWTH
Culture: NO GROWTH

## 2015-08-06 MED ORDER — METOPROLOL TARTRATE 25 MG PO TABS: 25.0000 mg | ORAL_TABLET | Freq: Two times a day (BID) | ORAL | Status: DC

## 2015-08-06 MED ORDER — APIXABAN 2.5 MG PO TABS: 2.5000 mg | ORAL_TABLET | Freq: Two times a day (BID) | ORAL | Status: DC

## 2015-08-06 NOTE — Discharge Summary (Signed)
DISCHARGE SUMMARY  ELLOWYN RIEVES  MR#: [id number]  DOB:10/13/1921  Date of Admission: 07/31/2015 Date of Discharge: 08/06/2015  Attending Physician:Heriberto Stmartin T  Patient's NWG:NFAOZ,HYQM M, MD  Consults:  none  Disposition: D/C to SNF   Follow-up Appts:     Follow-up Information    Follow up with [name], MD. Schedule an appointment as soon as possible for a visit in 2 weeks.   Specialty:  Internal Medicine   Why:  Follow-up with Dr. Shon Baton in 2 weeks for acute respiratory failure with hypoxia, paroxysmal atrial fibrillation, pulmonary hypertension   Contact information:   [place] [address] [phone]      Tests Needing Follow-up: -recheck of CBC in 3-5 days as a new start on Eliquis  -monitor BP and consider resuming prior Lisinopril if BP not controlled on BB alone   Discharge Diagnoses: Acute respiratory failure with hypoxia /CAP  Newly diagnosed Atrial Fibrillation w/ RVR  Elevated troponin - ACS? Chronic LBBB Hyponatremia  Hypokalemia Hypomagnesemia Hypothyroidism ? Irritable bowel with fecal incontinence Osteoporosis  Initial presentation: 81 y.o. F Hx depression, insomnia, hypothyroidism, peripheral neuropathy, uterine cancer S/P XRT, and S/P small bowel resection who was brought to [facility] ED during early AM hours 10/21 due to SOB and "just feeling bad". EMS was dispatched and on their arrival she was found to be in A.fib with RVR (HR in 180's). She was given albuterol, atrovent, adenosine x 2, 10mg  Cardizem, and 450cc NS bolus.  On ED arrival, she was hypotensive; however, this responded well to IVF's. She was placed on BiPAP due to hypoxia and increased WOB. CXR revealed RUL infiltrate. She had a rectal temp of 101.51F and initial lactate was elevated at 3.38.  Hospital Course:  Acute respiratory failure with hypoxia /CAP  -bilateral basilar infiltrates R > L - clinically improving  -has completed a course of  empiric abxfor 7 days -O2 sats 92% or greater on RA even w/ ambulation   Newly diagnosed Parox Atrial Fibrillation w/ intermittent RVR  -relapsed in and out of Afib w/ RVR during admit - CHA2DS2 VASc score is 4 - Has Bled score 2-3 - as pt is going to SNF facility it is felt that use of anticoag is reasonable - Eliquis initiated this admit   Elevated troponin -most likely demand ischemia/rate related - trended down w/o cp after peak at 0.29  Chronic LBBB  Hyponatremia  -hypovolemic - resolved with hydration   Hypokalemia -Resolved  Hypomagnesemia -replace and follow - goal is 2.0  Hypothyroidism -Continue synthroid 50 g daily  -TSH WNL  ? Irritable bowel with fecal incontinence -Continue outpatient PPI, bentyl, loperamide  Osteoporosis Continue outpatient raloxifene 60 mg daily    Medication List    STOP taking these medications        aspirin 81 MG EC tablet     lisinopril 10 MG tablet  Commonly known as:  PRINIVIL,ZESTRIL      TAKE these medications        apixaban 2.5 MG Tabs tablet  Commonly known as:  ELIQUIS  Take 1 tablet (2.5 mg total) by mouth 2 (two) times daily.     CALTRATE 600 PO  Take 600 mg by mouth daily.     dicyclomine 20 MG tablet  Commonly known as:  BENTYL  Take 1 tablet (20 mg total) by mouth 2 (two) times daily.     loperamide 2 MG capsule  Commonly known as:  IMODIUM  Take 1 capsule (  2 mg total) by mouth 5 (five) times daily as needed for diarrhea or loose stools.     metoprolol tartrate 25 MG tablet  Commonly known as:  LOPRESSOR  Take 1 tablet (25 mg total) by mouth 2 (two) times daily.     multivitamin with minerals tablet  Take 1 tablet by mouth daily.     NON FORMULARY  Take 1 capsule by mouth daily. Juice Plus. Food Supplement.     omeprazole 40 MG capsule  Commonly known as:  PRILOSEC  Take 40 mg by mouth daily.     ondansetron 4 MG tablet  Commonly known as:  ZOFRAN  Take 1 tablet (4 mg total) by mouth  every 6 (six) hours.     raloxifene 60 MG tablet  Commonly known as:  EVISTA  Take 60 mg by mouth daily.     SYNTHROID 50 MCG tablet  Generic drug:  levothyroxine  Take 50 mcg by mouth daily.     VITAMIN B1-B12 IJ  Inject 1 mL into the skin every 21 ( twenty-one) days.     Vitamin D (Ergocalciferol) 50000 UNITS Caps capsule  Commonly known as:  DRISDOL  Take 50,000 Units by mouth every 7 (seven) days.       Day of Discharge BP 151/83 mmHg  Pulse 72  Temp(Src) 98.1 F (36.7 C) (Oral)  Resp 16  Ht 5\' 5"  (1.651 m)  Wt 48.988 kg (108 lb)  BMI 17.97 kg/m2  SpO2 96%  Physical Exam: General: No acute respiratory distress Lungs: Clear to auscultation bilaterally without wheezes or crackles Cardiovascular: Regular rate and rhythm without murmur gallop or rub normal S1 and S2 Abdomen: Nontender, nondistended, soft, bowel sounds positive, no rebound, no ascites, no appreciable mass Extremities: No significant cyanosis, clubbing, or edema bilateral lower extremities  Basic Metabolic Panel:  Recent Labs Lab 08/01/15 0445 08/02/15 0257 08/03/15 0443 08/04/15 0255 08/05/15 0225  NA 127* 129* 132* 137 138  K 3.9 4.2 3.9 4.0 3.6  CL 98* 101 101 104 103  CO2 18* 21* 25 24 25   GLUCOSE 138* 100* 157* 154* 106*  BUN 9 7 6 11 11   CREATININE 0.75 0.50 0.55 0.72 0.67  CALCIUM 7.7* 8.1* 8.2* 8.9 8.7*  MG 2.7* 1.8 1.5* 1.6* 2.0  PHOS 2.6 2.5  --   --   --     Liver Function Tests:  Recent Labs Lab 08/03/15 0443 08/04/15 0255  AST 28 39  ALT 20 26  ALKPHOS 61 61  BILITOT 0.4 0.5  PROT 5.2* 5.5*  ALBUMIN 2.6* 2.8*    CBC:  Recent Labs Lab 08/01/15 0008 08/01/15 0445 08/02/15 0257 08/03/15 0443 08/04/15 0255 08/05/15 0225  WBC 8.5 7.9 5.8 3.7* 7.6 10.4  NEUTROABS 7.4  --   --  2.9 6.4  --   HGB 12.2 11.4* 11.2* 11.2* 11.6* 11.1*  HCT 36.6 33.5* 33.4* 33.1* 34.7* 33.6*  MCV 91.3 92.3 91.5 91.2 90.6 91.3  PLT 208 139* 195 222 260 266    Cardiac  Enzymes:  Recent Labs Lab 08/01/15 0445 08/01/15 1128 08/02/15 1700 08/02/15 2331 08/03/15 0443  TROPONINI 0.27* 0.29* 0.10* 0.07* 0.05*   BNP (last 3 results)  Recent Labs  08/01/15 0008  BNP 273.1*    Recent Results (from the past 240 hour(s))  Blood Culture (routine x 2)     Status: None (Preliminary result)   Collection Time: 08/01/15  1:13 AM  Result Value Ref Range Status   Specimen  Description BLOOD LEFT HAND  Final   Special Requests BOTTLES DRAWN AEROBIC AND ANAEROBIC 5CC  Final   Culture NO GROWTH 4 DAYS  Final   Report Status PENDING  Incomplete  Blood Culture (routine x 2)     Status: None (Preliminary result)   Collection Time: 08/01/15  1:17 AM  Result Value Ref Range Status   Specimen Description BLOOD LEFT WRIST  Final   Special Requests BOTTLES DRAWN AEROBIC AND ANAEROBIC 5CC  Final   Culture NO GROWTH 4 DAYS  Final   Report Status PENDING  Incomplete  Urine culture     Status: None   Collection Time: 08/01/15  1:25 AM  Result Value Ref Range Status   Specimen Description URINE, RANDOM  Final   Special Requests NONE  Final   Culture MULTIPLE SPECIES PRESENT, SUGGEST RECOLLECTION  Final   Report Status 08/02/2015 FINAL  Final  MRSA PCR Screening     Status: None   Collection Time: 08/01/15  3:42 AM  Result Value Ref Range Status   MRSA by PCR NEGATIVE NEGATIVE Final    Comment:        The GeneXpert MRSA Assay (FDA approved for NASAL specimens only), is one component of a comprehensive MRSA colonization surveillance program. It is not intended to diagnose MRSA infection nor to guide or monitor treatment for MRSA infections.   Culture, Urine     Status: None   Collection Time: 08/02/15 11:40 PM  Result Value Ref Range Status   Specimen Description URINE, CLEAN CATCH  Final   Special Requests NONE  Final   Culture NO GROWTH 1 DAY  Final   Report Status 08/04/2015 FINAL  Final      Time spent in discharge (includes decision making &  examination of pt): >35 minutes  08/06/2015, 12:08 PM   Cherene Altes, MD [facility] Office  [phone] Pager [phone]  On-Call/Text Page:      [email or web address]      password [facility]

## 2015-08-06 NOTE — Discharge Instructions (Signed)

## 2015-08-06 NOTE — Progress Notes (Signed)
Pt has been very appropriate with no problematic behaviors.

## 2015-08-06 NOTE — Clinical Social Work Note (Signed)
Patient has a bed at [facility] (patient's preferred facility). CSW has made [facility] aware of patient needing to DC to SNF. MD please write DC Summary and DC order when you feel patient is appropriate for DC.   Liz Beach MSW, [employer], [place], [phone]

## 2015-08-06 NOTE — Clinical Social Work Placement (Signed)
   CLINICAL SOCIAL WORK PLACEMENT  NOTE  Date:  08/06/2015  Patient Details  Name: Teresa Gonzalez MRN: [id number] Date of Birth: [birth year]  Clinical Social Work is seeking post-discharge placement for this patient at the [place] level of care (*CSW will initial, date and re-position this form in  chart as items are completed):  Yes   Patient/family provided with [place] Work Department's list of facilities offering this level of care within the geographic area requested by the patient (or if unable, by the patient's family).  Yes   Patient/family informed of their freedom to choose among providers that offer the needed level of care, that participate in Medicare, Medicaid or managed care program needed by the patient, have an available bed and are willing to accept the patient.  Yes   Patient/family informed of [facility]'s ownership interest in [facility] and [facility], as well as of the fact that they are under no obligation to receive care at these facilities.  PASRR submitted to EDS on 08/05/15     PASRR number received on 08/05/15     Existing PASRR number confirmed on       FL2 transmitted to all facilities in geographic area requested by pt/family on 08/05/15     FL2 transmitted to all facilities within larger geographic area on       Patient informed that his/her managed care company has contracts with or will negotiate with certain facilities, including the following:        Yes   Patient/family informed of bed offers received.  Patient chooses bed at [facility]     Physician recommends and patient chooses bed at      Patient to be transferred to [facility] on 08/06/15.  Patient to be transferred to facility by Daughter     Patient family notified on 08/06/15 of transfer.  Name of family member notified:  Daughter at bedside     PHYSICIAN       Additional Comment:  Per MD patient ready for DC to [facility]. RN, patient, patient's family, and facility notified of DC. RN given number for report. DC packet on chart. Ambulance transport requested for patient. CSW signing off.   _______________________________________________ Liz Beach MSW, [place], [place], [phone]

## 2015-08-07 DIAGNOSIS — I272 Other secondary pulmonary hypertension: Secondary | ICD-10-CM | POA: Diagnosis not present

## 2015-08-13 DIAGNOSIS — Z681 Body mass index (BMI) 19 or less, adult: Secondary | ICD-10-CM | POA: Diagnosis not present

## 2015-08-13 DIAGNOSIS — I4891 Unspecified atrial fibrillation: Secondary | ICD-10-CM | POA: Diagnosis not present

## 2015-08-13 DIAGNOSIS — M5416 Radiculopathy, lumbar region: Secondary | ICD-10-CM | POA: Diagnosis not present

## 2015-08-13 DIAGNOSIS — M1990 Unspecified osteoarthritis, unspecified site: Secondary | ICD-10-CM | POA: Diagnosis not present

## 2015-08-16 DIAGNOSIS — I2510 Atherosclerotic heart disease of native coronary artery without angina pectoris: Secondary | ICD-10-CM | POA: Diagnosis not present

## 2015-08-16 DIAGNOSIS — I119 Hypertensive heart disease without heart failure: Secondary | ICD-10-CM | POA: Diagnosis not present

## 2015-08-16 DIAGNOSIS — R1310 Dysphagia, unspecified: Secondary | ICD-10-CM | POA: Diagnosis not present

## 2015-08-16 DIAGNOSIS — Z7901 Long term (current) use of anticoagulants: Secondary | ICD-10-CM | POA: Diagnosis not present

## 2015-08-16 DIAGNOSIS — N3946 Mixed incontinence: Secondary | ICD-10-CM | POA: Diagnosis not present

## 2015-08-16 DIAGNOSIS — N182 Chronic kidney disease, stage 2 (mild): Secondary | ICD-10-CM | POA: Diagnosis not present

## 2015-08-16 DIAGNOSIS — R627 Adult failure to thrive: Secondary | ICD-10-CM | POA: Diagnosis not present

## 2015-08-16 DIAGNOSIS — I272 Other secondary pulmonary hypertension: Secondary | ICD-10-CM | POA: Diagnosis not present

## 2015-08-16 DIAGNOSIS — I4891 Unspecified atrial fibrillation: Secondary | ICD-10-CM | POA: Diagnosis not present

## 2015-08-18 DIAGNOSIS — N3946 Mixed incontinence: Secondary | ICD-10-CM | POA: Diagnosis not present

## 2015-08-18 DIAGNOSIS — R1310 Dysphagia, unspecified: Secondary | ICD-10-CM | POA: Diagnosis not present

## 2015-08-18 DIAGNOSIS — N182 Chronic kidney disease, stage 2 (mild): Secondary | ICD-10-CM | POA: Diagnosis not present

## 2015-08-18 DIAGNOSIS — I4891 Unspecified atrial fibrillation: Secondary | ICD-10-CM | POA: Diagnosis not present

## 2015-08-18 DIAGNOSIS — I119 Hypertensive heart disease without heart failure: Secondary | ICD-10-CM | POA: Diagnosis not present

## 2015-08-18 DIAGNOSIS — I2510 Atherosclerotic heart disease of native coronary artery without angina pectoris: Secondary | ICD-10-CM | POA: Diagnosis not present

## 2015-08-18 DIAGNOSIS — R627 Adult failure to thrive: Secondary | ICD-10-CM | POA: Diagnosis not present

## 2015-08-18 DIAGNOSIS — Z7901 Long term (current) use of anticoagulants: Secondary | ICD-10-CM | POA: Diagnosis not present

## 2015-08-18 DIAGNOSIS — I272 Other secondary pulmonary hypertension: Secondary | ICD-10-CM | POA: Diagnosis not present

## 2015-08-19 DIAGNOSIS — Z7901 Long term (current) use of anticoagulants: Secondary | ICD-10-CM | POA: Diagnosis not present

## 2015-08-19 DIAGNOSIS — R627 Adult failure to thrive: Secondary | ICD-10-CM | POA: Diagnosis not present

## 2015-08-19 DIAGNOSIS — N3946 Mixed incontinence: Secondary | ICD-10-CM | POA: Diagnosis not present

## 2015-08-19 DIAGNOSIS — I272 Other secondary pulmonary hypertension: Secondary | ICD-10-CM | POA: Diagnosis not present

## 2015-08-19 DIAGNOSIS — I4891 Unspecified atrial fibrillation: Secondary | ICD-10-CM | POA: Diagnosis not present

## 2015-08-19 DIAGNOSIS — N182 Chronic kidney disease, stage 2 (mild): Secondary | ICD-10-CM | POA: Diagnosis not present

## 2015-08-19 DIAGNOSIS — R1310 Dysphagia, unspecified: Secondary | ICD-10-CM | POA: Diagnosis not present

## 2015-08-19 DIAGNOSIS — I2510 Atherosclerotic heart disease of native coronary artery without angina pectoris: Secondary | ICD-10-CM | POA: Diagnosis not present

## 2015-08-19 DIAGNOSIS — I119 Hypertensive heart disease without heart failure: Secondary | ICD-10-CM | POA: Diagnosis not present

## 2015-08-20 DIAGNOSIS — R627 Adult failure to thrive: Secondary | ICD-10-CM | POA: Diagnosis not present

## 2015-08-20 DIAGNOSIS — N182 Chronic kidney disease, stage 2 (mild): Secondary | ICD-10-CM | POA: Diagnosis not present

## 2015-08-20 DIAGNOSIS — I2510 Atherosclerotic heart disease of native coronary artery without angina pectoris: Secondary | ICD-10-CM | POA: Diagnosis not present

## 2015-08-20 DIAGNOSIS — I4891 Unspecified atrial fibrillation: Secondary | ICD-10-CM | POA: Diagnosis not present

## 2015-08-20 DIAGNOSIS — I119 Hypertensive heart disease without heart failure: Secondary | ICD-10-CM | POA: Diagnosis not present

## 2015-08-20 DIAGNOSIS — R1310 Dysphagia, unspecified: Secondary | ICD-10-CM | POA: Diagnosis not present

## 2015-08-20 DIAGNOSIS — Z7901 Long term (current) use of anticoagulants: Secondary | ICD-10-CM | POA: Diagnosis not present

## 2015-08-20 DIAGNOSIS — I272 Other secondary pulmonary hypertension: Secondary | ICD-10-CM | POA: Diagnosis not present

## 2015-08-20 DIAGNOSIS — N3946 Mixed incontinence: Secondary | ICD-10-CM | POA: Diagnosis not present

## 2015-08-21 DIAGNOSIS — I119 Hypertensive heart disease without heart failure: Secondary | ICD-10-CM | POA: Diagnosis not present

## 2015-08-21 DIAGNOSIS — I4891 Unspecified atrial fibrillation: Secondary | ICD-10-CM | POA: Diagnosis not present

## 2015-08-21 DIAGNOSIS — N3946 Mixed incontinence: Secondary | ICD-10-CM | POA: Diagnosis not present

## 2015-08-21 DIAGNOSIS — N182 Chronic kidney disease, stage 2 (mild): Secondary | ICD-10-CM | POA: Diagnosis not present

## 2015-08-21 DIAGNOSIS — I2510 Atherosclerotic heart disease of native coronary artery without angina pectoris: Secondary | ICD-10-CM | POA: Diagnosis not present

## 2015-08-21 DIAGNOSIS — I272 Other secondary pulmonary hypertension: Secondary | ICD-10-CM | POA: Diagnosis not present

## 2015-08-21 DIAGNOSIS — R1310 Dysphagia, unspecified: Secondary | ICD-10-CM | POA: Diagnosis not present

## 2015-08-21 DIAGNOSIS — Z7901 Long term (current) use of anticoagulants: Secondary | ICD-10-CM | POA: Diagnosis not present

## 2015-08-21 DIAGNOSIS — R627 Adult failure to thrive: Secondary | ICD-10-CM | POA: Diagnosis not present

## 2015-08-22 DIAGNOSIS — R1310 Dysphagia, unspecified: Secondary | ICD-10-CM | POA: Diagnosis not present

## 2015-08-22 DIAGNOSIS — N182 Chronic kidney disease, stage 2 (mild): Secondary | ICD-10-CM | POA: Diagnosis not present

## 2015-08-22 DIAGNOSIS — N3946 Mixed incontinence: Secondary | ICD-10-CM | POA: Diagnosis not present

## 2015-08-22 DIAGNOSIS — I272 Other secondary pulmonary hypertension: Secondary | ICD-10-CM | POA: Diagnosis not present

## 2015-08-22 DIAGNOSIS — R627 Adult failure to thrive: Secondary | ICD-10-CM | POA: Diagnosis not present

## 2015-08-22 DIAGNOSIS — I2510 Atherosclerotic heart disease of native coronary artery without angina pectoris: Secondary | ICD-10-CM | POA: Diagnosis not present

## 2015-08-22 DIAGNOSIS — Z7901 Long term (current) use of anticoagulants: Secondary | ICD-10-CM | POA: Diagnosis not present

## 2015-08-22 DIAGNOSIS — I4891 Unspecified atrial fibrillation: Secondary | ICD-10-CM | POA: Diagnosis not present

## 2015-08-22 DIAGNOSIS — I119 Hypertensive heart disease without heart failure: Secondary | ICD-10-CM | POA: Diagnosis not present

## 2015-08-25 DIAGNOSIS — N182 Chronic kidney disease, stage 2 (mild): Secondary | ICD-10-CM | POA: Diagnosis not present

## 2015-08-25 DIAGNOSIS — I4891 Unspecified atrial fibrillation: Secondary | ICD-10-CM | POA: Diagnosis not present

## 2015-08-25 DIAGNOSIS — R1310 Dysphagia, unspecified: Secondary | ICD-10-CM | POA: Diagnosis not present

## 2015-08-25 DIAGNOSIS — I272 Other secondary pulmonary hypertension: Secondary | ICD-10-CM | POA: Diagnosis not present

## 2015-08-25 DIAGNOSIS — I2510 Atherosclerotic heart disease of native coronary artery without angina pectoris: Secondary | ICD-10-CM | POA: Diagnosis not present

## 2015-08-25 DIAGNOSIS — R627 Adult failure to thrive: Secondary | ICD-10-CM | POA: Diagnosis not present

## 2015-08-25 DIAGNOSIS — Z7901 Long term (current) use of anticoagulants: Secondary | ICD-10-CM | POA: Diagnosis not present

## 2015-08-25 DIAGNOSIS — I119 Hypertensive heart disease without heart failure: Secondary | ICD-10-CM | POA: Diagnosis not present

## 2015-08-25 DIAGNOSIS — N3946 Mixed incontinence: Secondary | ICD-10-CM | POA: Diagnosis not present

## 2015-08-26 DIAGNOSIS — R627 Adult failure to thrive: Secondary | ICD-10-CM | POA: Diagnosis not present

## 2015-08-26 DIAGNOSIS — I119 Hypertensive heart disease without heart failure: Secondary | ICD-10-CM | POA: Diagnosis not present

## 2015-08-26 DIAGNOSIS — R1310 Dysphagia, unspecified: Secondary | ICD-10-CM | POA: Diagnosis not present

## 2015-08-26 DIAGNOSIS — I272 Other secondary pulmonary hypertension: Secondary | ICD-10-CM | POA: Diagnosis not present

## 2015-08-26 DIAGNOSIS — I4891 Unspecified atrial fibrillation: Secondary | ICD-10-CM | POA: Diagnosis not present

## 2015-08-26 DIAGNOSIS — N3946 Mixed incontinence: Secondary | ICD-10-CM | POA: Diagnosis not present

## 2015-08-26 DIAGNOSIS — I2510 Atherosclerotic heart disease of native coronary artery without angina pectoris: Secondary | ICD-10-CM | POA: Diagnosis not present

## 2015-08-26 DIAGNOSIS — N182 Chronic kidney disease, stage 2 (mild): Secondary | ICD-10-CM | POA: Diagnosis not present

## 2015-08-26 DIAGNOSIS — Z7901 Long term (current) use of anticoagulants: Secondary | ICD-10-CM | POA: Diagnosis not present

## 2015-08-27 DIAGNOSIS — N182 Chronic kidney disease, stage 2 (mild): Secondary | ICD-10-CM | POA: Diagnosis not present

## 2015-08-27 DIAGNOSIS — I272 Other secondary pulmonary hypertension: Secondary | ICD-10-CM | POA: Diagnosis not present

## 2015-08-27 DIAGNOSIS — N3946 Mixed incontinence: Secondary | ICD-10-CM | POA: Diagnosis not present

## 2015-08-27 DIAGNOSIS — I4891 Unspecified atrial fibrillation: Secondary | ICD-10-CM | POA: Diagnosis not present

## 2015-08-27 DIAGNOSIS — I2510 Atherosclerotic heart disease of native coronary artery without angina pectoris: Secondary | ICD-10-CM | POA: Diagnosis not present

## 2015-08-27 DIAGNOSIS — I119 Hypertensive heart disease without heart failure: Secondary | ICD-10-CM | POA: Diagnosis not present

## 2015-08-27 DIAGNOSIS — Z7901 Long term (current) use of anticoagulants: Secondary | ICD-10-CM | POA: Diagnosis not present

## 2015-08-27 DIAGNOSIS — R1310 Dysphagia, unspecified: Secondary | ICD-10-CM | POA: Diagnosis not present

## 2015-08-27 DIAGNOSIS — R627 Adult failure to thrive: Secondary | ICD-10-CM | POA: Diagnosis not present

## 2015-08-28 DIAGNOSIS — R627 Adult failure to thrive: Secondary | ICD-10-CM | POA: Diagnosis not present

## 2015-08-28 DIAGNOSIS — N182 Chronic kidney disease, stage 2 (mild): Secondary | ICD-10-CM | POA: Diagnosis not present

## 2015-08-28 DIAGNOSIS — I119 Hypertensive heart disease without heart failure: Secondary | ICD-10-CM | POA: Diagnosis not present

## 2015-08-28 DIAGNOSIS — R1310 Dysphagia, unspecified: Secondary | ICD-10-CM | POA: Diagnosis not present

## 2015-08-28 DIAGNOSIS — I4891 Unspecified atrial fibrillation: Secondary | ICD-10-CM | POA: Diagnosis not present

## 2015-08-28 DIAGNOSIS — I2510 Atherosclerotic heart disease of native coronary artery without angina pectoris: Secondary | ICD-10-CM | POA: Diagnosis not present

## 2015-08-28 DIAGNOSIS — N3946 Mixed incontinence: Secondary | ICD-10-CM | POA: Diagnosis not present

## 2015-08-28 DIAGNOSIS — I272 Other secondary pulmonary hypertension: Secondary | ICD-10-CM | POA: Diagnosis not present

## 2015-08-28 DIAGNOSIS — Z7901 Long term (current) use of anticoagulants: Secondary | ICD-10-CM | POA: Diagnosis not present

## 2015-09-01 DIAGNOSIS — Z7901 Long term (current) use of anticoagulants: Secondary | ICD-10-CM | POA: Diagnosis not present

## 2015-09-01 DIAGNOSIS — I2510 Atherosclerotic heart disease of native coronary artery without angina pectoris: Secondary | ICD-10-CM | POA: Diagnosis not present

## 2015-09-01 DIAGNOSIS — N182 Chronic kidney disease, stage 2 (mild): Secondary | ICD-10-CM | POA: Diagnosis not present

## 2015-09-01 DIAGNOSIS — I4891 Unspecified atrial fibrillation: Secondary | ICD-10-CM | POA: Diagnosis not present

## 2015-09-01 DIAGNOSIS — I272 Other secondary pulmonary hypertension: Secondary | ICD-10-CM | POA: Diagnosis not present

## 2015-09-01 DIAGNOSIS — R627 Adult failure to thrive: Secondary | ICD-10-CM | POA: Diagnosis not present

## 2015-09-01 DIAGNOSIS — I119 Hypertensive heart disease without heart failure: Secondary | ICD-10-CM | POA: Diagnosis not present

## 2015-09-01 DIAGNOSIS — R1310 Dysphagia, unspecified: Secondary | ICD-10-CM | POA: Diagnosis not present

## 2015-09-01 DIAGNOSIS — N3946 Mixed incontinence: Secondary | ICD-10-CM | POA: Diagnosis not present

## 2015-09-02 DIAGNOSIS — I119 Hypertensive heart disease without heart failure: Secondary | ICD-10-CM | POA: Diagnosis not present

## 2015-09-02 DIAGNOSIS — R627 Adult failure to thrive: Secondary | ICD-10-CM | POA: Diagnosis not present

## 2015-09-02 DIAGNOSIS — Z7901 Long term (current) use of anticoagulants: Secondary | ICD-10-CM | POA: Diagnosis not present

## 2015-09-02 DIAGNOSIS — N3946 Mixed incontinence: Secondary | ICD-10-CM | POA: Diagnosis not present

## 2015-09-02 DIAGNOSIS — I2510 Atherosclerotic heart disease of native coronary artery without angina pectoris: Secondary | ICD-10-CM | POA: Diagnosis not present

## 2015-09-02 DIAGNOSIS — R1310 Dysphagia, unspecified: Secondary | ICD-10-CM | POA: Diagnosis not present

## 2015-09-02 DIAGNOSIS — I272 Other secondary pulmonary hypertension: Secondary | ICD-10-CM | POA: Diagnosis not present

## 2015-09-02 DIAGNOSIS — I4891 Unspecified atrial fibrillation: Secondary | ICD-10-CM | POA: Diagnosis not present

## 2015-09-02 DIAGNOSIS — N182 Chronic kidney disease, stage 2 (mild): Secondary | ICD-10-CM | POA: Diagnosis not present

## 2015-09-04 DIAGNOSIS — J189 Pneumonia, unspecified organism: Secondary | ICD-10-CM | POA: Diagnosis not present

## 2015-09-04 DIAGNOSIS — I4891 Unspecified atrial fibrillation: Secondary | ICD-10-CM | POA: Diagnosis not present

## 2015-09-04 DIAGNOSIS — I248 Other forms of acute ischemic heart disease: Secondary | ICD-10-CM | POA: Diagnosis not present

## 2015-09-04 DIAGNOSIS — R627 Adult failure to thrive: Secondary | ICD-10-CM | POA: Diagnosis not present

## 2015-09-04 DIAGNOSIS — Z681 Body mass index (BMI) 19 or less, adult: Secondary | ICD-10-CM | POA: Diagnosis not present

## 2015-09-04 DIAGNOSIS — I272 Other secondary pulmonary hypertension: Secondary | ICD-10-CM | POA: Diagnosis not present

## 2015-09-09 DIAGNOSIS — R627 Adult failure to thrive: Secondary | ICD-10-CM | POA: Diagnosis not present

## 2015-09-09 DIAGNOSIS — I272 Other secondary pulmonary hypertension: Secondary | ICD-10-CM | POA: Diagnosis not present

## 2015-09-09 DIAGNOSIS — N182 Chronic kidney disease, stage 2 (mild): Secondary | ICD-10-CM | POA: Diagnosis not present

## 2015-09-09 DIAGNOSIS — R1310 Dysphagia, unspecified: Secondary | ICD-10-CM | POA: Diagnosis not present

## 2015-09-09 DIAGNOSIS — I2510 Atherosclerotic heart disease of native coronary artery without angina pectoris: Secondary | ICD-10-CM | POA: Diagnosis not present

## 2015-09-09 DIAGNOSIS — N3946 Mixed incontinence: Secondary | ICD-10-CM | POA: Diagnosis not present

## 2015-09-09 DIAGNOSIS — Z7901 Long term (current) use of anticoagulants: Secondary | ICD-10-CM | POA: Diagnosis not present

## 2015-09-09 DIAGNOSIS — I4891 Unspecified atrial fibrillation: Secondary | ICD-10-CM | POA: Diagnosis not present

## 2015-09-09 DIAGNOSIS — I119 Hypertensive heart disease without heart failure: Secondary | ICD-10-CM | POA: Diagnosis not present

## 2015-09-10 DIAGNOSIS — R197 Diarrhea, unspecified: Secondary | ICD-10-CM | POA: Diagnosis not present

## 2015-09-11 DIAGNOSIS — I2510 Atherosclerotic heart disease of native coronary artery without angina pectoris: Secondary | ICD-10-CM | POA: Diagnosis not present

## 2015-09-11 DIAGNOSIS — I272 Other secondary pulmonary hypertension: Secondary | ICD-10-CM | POA: Diagnosis not present

## 2015-09-11 DIAGNOSIS — N182 Chronic kidney disease, stage 2 (mild): Secondary | ICD-10-CM | POA: Diagnosis not present

## 2015-09-11 DIAGNOSIS — N3946 Mixed incontinence: Secondary | ICD-10-CM | POA: Diagnosis not present

## 2015-09-11 DIAGNOSIS — R627 Adult failure to thrive: Secondary | ICD-10-CM | POA: Diagnosis not present

## 2015-09-11 DIAGNOSIS — I119 Hypertensive heart disease without heart failure: Secondary | ICD-10-CM | POA: Diagnosis not present

## 2015-09-11 DIAGNOSIS — I4891 Unspecified atrial fibrillation: Secondary | ICD-10-CM | POA: Diagnosis not present

## 2015-09-11 DIAGNOSIS — Z7901 Long term (current) use of anticoagulants: Secondary | ICD-10-CM | POA: Diagnosis not present

## 2015-09-11 DIAGNOSIS — R1310 Dysphagia, unspecified: Secondary | ICD-10-CM | POA: Diagnosis not present

## 2015-09-17 DIAGNOSIS — N182 Chronic kidney disease, stage 2 (mild): Secondary | ICD-10-CM | POA: Diagnosis not present

## 2015-09-17 DIAGNOSIS — I2510 Atherosclerotic heart disease of native coronary artery without angina pectoris: Secondary | ICD-10-CM | POA: Diagnosis not present

## 2015-09-17 DIAGNOSIS — I272 Other secondary pulmonary hypertension: Secondary | ICD-10-CM | POA: Diagnosis not present

## 2015-09-17 DIAGNOSIS — I119 Hypertensive heart disease without heart failure: Secondary | ICD-10-CM | POA: Diagnosis not present

## 2015-09-17 DIAGNOSIS — Z7901 Long term (current) use of anticoagulants: Secondary | ICD-10-CM | POA: Diagnosis not present

## 2015-09-17 DIAGNOSIS — R1310 Dysphagia, unspecified: Secondary | ICD-10-CM | POA: Diagnosis not present

## 2015-09-17 DIAGNOSIS — I4891 Unspecified atrial fibrillation: Secondary | ICD-10-CM | POA: Diagnosis not present

## 2015-09-17 DIAGNOSIS — R627 Adult failure to thrive: Secondary | ICD-10-CM | POA: Diagnosis not present

## 2015-09-17 DIAGNOSIS — N3946 Mixed incontinence: Secondary | ICD-10-CM | POA: Diagnosis not present

## 2015-09-23 DIAGNOSIS — I119 Hypertensive heart disease without heart failure: Secondary | ICD-10-CM | POA: Diagnosis not present

## 2015-09-23 DIAGNOSIS — R1310 Dysphagia, unspecified: Secondary | ICD-10-CM | POA: Diagnosis not present

## 2015-09-23 DIAGNOSIS — R627 Adult failure to thrive: Secondary | ICD-10-CM | POA: Diagnosis not present

## 2015-09-23 DIAGNOSIS — Z7901 Long term (current) use of anticoagulants: Secondary | ICD-10-CM | POA: Diagnosis not present

## 2015-09-23 DIAGNOSIS — I2510 Atherosclerotic heart disease of native coronary artery without angina pectoris: Secondary | ICD-10-CM | POA: Diagnosis not present

## 2015-09-23 DIAGNOSIS — N182 Chronic kidney disease, stage 2 (mild): Secondary | ICD-10-CM | POA: Diagnosis not present

## 2015-09-23 DIAGNOSIS — N3946 Mixed incontinence: Secondary | ICD-10-CM | POA: Diagnosis not present

## 2015-09-23 DIAGNOSIS — I4891 Unspecified atrial fibrillation: Secondary | ICD-10-CM | POA: Diagnosis not present

## 2015-09-23 DIAGNOSIS — I272 Other secondary pulmonary hypertension: Secondary | ICD-10-CM | POA: Diagnosis not present

## 2015-09-25 DIAGNOSIS — N182 Chronic kidney disease, stage 2 (mild): Secondary | ICD-10-CM | POA: Diagnosis not present

## 2015-09-25 DIAGNOSIS — R627 Adult failure to thrive: Secondary | ICD-10-CM | POA: Diagnosis not present

## 2015-09-25 DIAGNOSIS — I272 Other secondary pulmonary hypertension: Secondary | ICD-10-CM | POA: Diagnosis not present

## 2015-09-25 DIAGNOSIS — Z681 Body mass index (BMI) 19 or less, adult: Secondary | ICD-10-CM | POA: Diagnosis not present

## 2015-09-25 DIAGNOSIS — K529 Noninfective gastroenteritis and colitis, unspecified: Secondary | ICD-10-CM | POA: Diagnosis not present

## 2015-09-25 DIAGNOSIS — I10 Essential (primary) hypertension: Secondary | ICD-10-CM | POA: Diagnosis not present

## 2015-09-25 DIAGNOSIS — E038 Other specified hypothyroidism: Secondary | ICD-10-CM | POA: Diagnosis not present

## 2015-09-29 DIAGNOSIS — N3946 Mixed incontinence: Secondary | ICD-10-CM | POA: Diagnosis not present

## 2015-09-29 DIAGNOSIS — Z7901 Long term (current) use of anticoagulants: Secondary | ICD-10-CM | POA: Diagnosis not present

## 2015-09-29 DIAGNOSIS — R1310 Dysphagia, unspecified: Secondary | ICD-10-CM | POA: Diagnosis not present

## 2015-09-29 DIAGNOSIS — I4891 Unspecified atrial fibrillation: Secondary | ICD-10-CM | POA: Diagnosis not present

## 2015-09-29 DIAGNOSIS — R627 Adult failure to thrive: Secondary | ICD-10-CM | POA: Diagnosis not present

## 2015-09-29 DIAGNOSIS — N182 Chronic kidney disease, stage 2 (mild): Secondary | ICD-10-CM | POA: Diagnosis not present

## 2015-09-29 DIAGNOSIS — I119 Hypertensive heart disease without heart failure: Secondary | ICD-10-CM | POA: Diagnosis not present

## 2015-09-29 DIAGNOSIS — I272 Other secondary pulmonary hypertension: Secondary | ICD-10-CM | POA: Diagnosis not present

## 2015-09-29 DIAGNOSIS — I2510 Atherosclerotic heart disease of native coronary artery without angina pectoris: Secondary | ICD-10-CM | POA: Diagnosis not present

## 2015-10-09 DIAGNOSIS — I2510 Atherosclerotic heart disease of native coronary artery without angina pectoris: Secondary | ICD-10-CM | POA: Diagnosis not present

## 2015-10-09 DIAGNOSIS — Z7901 Long term (current) use of anticoagulants: Secondary | ICD-10-CM | POA: Diagnosis not present

## 2015-10-09 DIAGNOSIS — N3946 Mixed incontinence: Secondary | ICD-10-CM | POA: Diagnosis not present

## 2015-10-09 DIAGNOSIS — N182 Chronic kidney disease, stage 2 (mild): Secondary | ICD-10-CM | POA: Diagnosis not present

## 2015-10-09 DIAGNOSIS — R627 Adult failure to thrive: Secondary | ICD-10-CM | POA: Diagnosis not present

## 2015-10-09 DIAGNOSIS — R1310 Dysphagia, unspecified: Secondary | ICD-10-CM | POA: Diagnosis not present

## 2015-10-09 DIAGNOSIS — I272 Other secondary pulmonary hypertension: Secondary | ICD-10-CM | POA: Diagnosis not present

## 2015-10-09 DIAGNOSIS — I119 Hypertensive heart disease without heart failure: Secondary | ICD-10-CM | POA: Diagnosis not present

## 2015-10-09 DIAGNOSIS — I4891 Unspecified atrial fibrillation: Secondary | ICD-10-CM | POA: Diagnosis not present

## 2015-10-15 DIAGNOSIS — R627 Adult failure to thrive: Secondary | ICD-10-CM | POA: Diagnosis not present

## 2015-10-15 DIAGNOSIS — I4891 Unspecified atrial fibrillation: Secondary | ICD-10-CM | POA: Diagnosis not present

## 2015-10-15 DIAGNOSIS — I272 Other secondary pulmonary hypertension: Secondary | ICD-10-CM | POA: Diagnosis not present

## 2015-10-15 DIAGNOSIS — N390 Urinary tract infection, site not specified: Secondary | ICD-10-CM | POA: Diagnosis not present

## 2015-10-17 DIAGNOSIS — N182 Chronic kidney disease, stage 2 (mild): Secondary | ICD-10-CM | POA: Diagnosis not present

## 2015-10-17 DIAGNOSIS — N390 Urinary tract infection, site not specified: Secondary | ICD-10-CM | POA: Diagnosis not present

## 2015-10-17 DIAGNOSIS — I2510 Atherosclerotic heart disease of native coronary artery without angina pectoris: Secondary | ICD-10-CM | POA: Diagnosis not present

## 2015-10-17 DIAGNOSIS — I4891 Unspecified atrial fibrillation: Secondary | ICD-10-CM | POA: Diagnosis not present

## 2015-10-17 DIAGNOSIS — N3946 Mixed incontinence: Secondary | ICD-10-CM | POA: Diagnosis not present

## 2015-10-17 DIAGNOSIS — H5210 Myopia, unspecified eye: Secondary | ICD-10-CM | POA: Diagnosis not present

## 2015-10-17 DIAGNOSIS — H5203 Hypermetropia, bilateral: Secondary | ICD-10-CM | POA: Diagnosis not present

## 2015-10-17 DIAGNOSIS — R627 Adult failure to thrive: Secondary | ICD-10-CM | POA: Diagnosis not present

## 2015-10-17 DIAGNOSIS — I272 Other secondary pulmonary hypertension: Secondary | ICD-10-CM | POA: Diagnosis not present

## 2015-10-17 DIAGNOSIS — I119 Hypertensive heart disease without heart failure: Secondary | ICD-10-CM | POA: Diagnosis not present

## 2015-10-17 DIAGNOSIS — R1310 Dysphagia, unspecified: Secondary | ICD-10-CM | POA: Diagnosis not present

## 2015-10-21 DIAGNOSIS — I4891 Unspecified atrial fibrillation: Secondary | ICD-10-CM | POA: Diagnosis not present

## 2015-10-21 DIAGNOSIS — N182 Chronic kidney disease, stage 2 (mild): Secondary | ICD-10-CM | POA: Diagnosis not present

## 2015-10-21 DIAGNOSIS — R627 Adult failure to thrive: Secondary | ICD-10-CM | POA: Diagnosis not present

## 2015-10-21 DIAGNOSIS — I119 Hypertensive heart disease without heart failure: Secondary | ICD-10-CM | POA: Diagnosis not present

## 2015-10-21 DIAGNOSIS — I272 Other secondary pulmonary hypertension: Secondary | ICD-10-CM | POA: Diagnosis not present

## 2015-10-21 DIAGNOSIS — I2510 Atherosclerotic heart disease of native coronary artery without angina pectoris: Secondary | ICD-10-CM | POA: Diagnosis not present

## 2015-10-21 DIAGNOSIS — N390 Urinary tract infection, site not specified: Secondary | ICD-10-CM | POA: Diagnosis not present

## 2015-10-21 DIAGNOSIS — R1310 Dysphagia, unspecified: Secondary | ICD-10-CM | POA: Diagnosis not present

## 2015-10-21 DIAGNOSIS — N3946 Mixed incontinence: Secondary | ICD-10-CM | POA: Diagnosis not present

## 2015-10-27 DIAGNOSIS — N3946 Mixed incontinence: Secondary | ICD-10-CM | POA: Diagnosis not present

## 2015-10-27 DIAGNOSIS — I272 Other secondary pulmonary hypertension: Secondary | ICD-10-CM | POA: Diagnosis not present

## 2015-10-27 DIAGNOSIS — R1310 Dysphagia, unspecified: Secondary | ICD-10-CM | POA: Diagnosis not present

## 2015-10-27 DIAGNOSIS — I4891 Unspecified atrial fibrillation: Secondary | ICD-10-CM | POA: Diagnosis not present

## 2015-10-27 DIAGNOSIS — I2510 Atherosclerotic heart disease of native coronary artery without angina pectoris: Secondary | ICD-10-CM | POA: Diagnosis not present

## 2015-10-27 DIAGNOSIS — I119 Hypertensive heart disease without heart failure: Secondary | ICD-10-CM | POA: Diagnosis not present

## 2015-10-27 DIAGNOSIS — R627 Adult failure to thrive: Secondary | ICD-10-CM | POA: Diagnosis not present

## 2015-10-27 DIAGNOSIS — N390 Urinary tract infection, site not specified: Secondary | ICD-10-CM | POA: Diagnosis not present

## 2015-10-27 DIAGNOSIS — N182 Chronic kidney disease, stage 2 (mild): Secondary | ICD-10-CM | POA: Diagnosis not present

## 2015-11-04 DIAGNOSIS — N390 Urinary tract infection, site not specified: Secondary | ICD-10-CM | POA: Diagnosis not present

## 2015-11-04 DIAGNOSIS — R1310 Dysphagia, unspecified: Secondary | ICD-10-CM | POA: Diagnosis not present

## 2015-11-04 DIAGNOSIS — I2510 Atherosclerotic heart disease of native coronary artery without angina pectoris: Secondary | ICD-10-CM | POA: Diagnosis not present

## 2015-11-04 DIAGNOSIS — I4891 Unspecified atrial fibrillation: Secondary | ICD-10-CM | POA: Diagnosis not present

## 2015-11-04 DIAGNOSIS — N3946 Mixed incontinence: Secondary | ICD-10-CM | POA: Diagnosis not present

## 2015-11-04 DIAGNOSIS — R627 Adult failure to thrive: Secondary | ICD-10-CM | POA: Diagnosis not present

## 2015-11-04 DIAGNOSIS — I272 Other secondary pulmonary hypertension: Secondary | ICD-10-CM | POA: Diagnosis not present

## 2015-11-04 DIAGNOSIS — I119 Hypertensive heart disease without heart failure: Secondary | ICD-10-CM | POA: Diagnosis not present

## 2015-11-04 DIAGNOSIS — N182 Chronic kidney disease, stage 2 (mild): Secondary | ICD-10-CM | POA: Diagnosis not present

## 2015-11-10 DIAGNOSIS — R1310 Dysphagia, unspecified: Secondary | ICD-10-CM | POA: Diagnosis not present

## 2015-11-10 DIAGNOSIS — I119 Hypertensive heart disease without heart failure: Secondary | ICD-10-CM | POA: Diagnosis not present

## 2015-11-10 DIAGNOSIS — I2510 Atherosclerotic heart disease of native coronary artery without angina pectoris: Secondary | ICD-10-CM | POA: Diagnosis not present

## 2015-11-10 DIAGNOSIS — I272 Other secondary pulmonary hypertension: Secondary | ICD-10-CM | POA: Diagnosis not present

## 2015-11-10 DIAGNOSIS — N390 Urinary tract infection, site not specified: Secondary | ICD-10-CM | POA: Diagnosis not present

## 2015-11-10 DIAGNOSIS — I4891 Unspecified atrial fibrillation: Secondary | ICD-10-CM | POA: Diagnosis not present

## 2015-11-10 DIAGNOSIS — N3946 Mixed incontinence: Secondary | ICD-10-CM | POA: Diagnosis not present

## 2015-11-10 DIAGNOSIS — R627 Adult failure to thrive: Secondary | ICD-10-CM | POA: Diagnosis not present

## 2015-11-10 DIAGNOSIS — N182 Chronic kidney disease, stage 2 (mild): Secondary | ICD-10-CM | POA: Diagnosis not present

## 2015-11-19 DIAGNOSIS — R1310 Dysphagia, unspecified: Secondary | ICD-10-CM | POA: Diagnosis not present

## 2015-11-19 DIAGNOSIS — N182 Chronic kidney disease, stage 2 (mild): Secondary | ICD-10-CM | POA: Diagnosis not present

## 2015-11-19 DIAGNOSIS — N3946 Mixed incontinence: Secondary | ICD-10-CM | POA: Diagnosis not present

## 2015-11-19 DIAGNOSIS — I2510 Atherosclerotic heart disease of native coronary artery without angina pectoris: Secondary | ICD-10-CM | POA: Diagnosis not present

## 2015-11-19 DIAGNOSIS — R627 Adult failure to thrive: Secondary | ICD-10-CM | POA: Diagnosis not present

## 2015-11-19 DIAGNOSIS — N390 Urinary tract infection, site not specified: Secondary | ICD-10-CM | POA: Diagnosis not present

## 2015-11-19 DIAGNOSIS — I4891 Unspecified atrial fibrillation: Secondary | ICD-10-CM | POA: Diagnosis not present

## 2015-11-19 DIAGNOSIS — I272 Other secondary pulmonary hypertension: Secondary | ICD-10-CM | POA: Diagnosis not present

## 2015-11-19 DIAGNOSIS — I119 Hypertensive heart disease without heart failure: Secondary | ICD-10-CM | POA: Diagnosis not present

## 2015-11-26 DIAGNOSIS — Z681 Body mass index (BMI) 19 or less, adult: Secondary | ICD-10-CM | POA: Diagnosis not present

## 2015-11-26 DIAGNOSIS — M62830 Muscle spasm of back: Secondary | ICD-10-CM | POA: Diagnosis not present

## 2015-11-26 DIAGNOSIS — S60410A Abrasion of right index finger, initial encounter: Secondary | ICD-10-CM | POA: Diagnosis not present

## 2015-11-28 DIAGNOSIS — L0889 Other specified local infections of the skin and subcutaneous tissue: Secondary | ICD-10-CM | POA: Diagnosis not present

## 2015-11-28 DIAGNOSIS — Z681 Body mass index (BMI) 19 or less, adult: Secondary | ICD-10-CM | POA: Diagnosis not present

## 2015-11-28 DIAGNOSIS — L03019 Cellulitis of unspecified finger: Secondary | ICD-10-CM | POA: Diagnosis not present

## 2015-11-28 DIAGNOSIS — S60410D Abrasion of right index finger, subsequent encounter: Secondary | ICD-10-CM | POA: Diagnosis not present

## 2015-12-05 DIAGNOSIS — I10 Essential (primary) hypertension: Secondary | ICD-10-CM | POA: Diagnosis not present

## 2015-12-05 DIAGNOSIS — L0889 Other specified local infections of the skin and subcutaneous tissue: Secondary | ICD-10-CM | POA: Diagnosis not present

## 2015-12-05 DIAGNOSIS — S60410S Abrasion of right index finger, sequela: Secondary | ICD-10-CM | POA: Diagnosis not present

## 2015-12-05 DIAGNOSIS — L03011 Cellulitis of right finger: Secondary | ICD-10-CM | POA: Diagnosis not present

## 2015-12-18 DIAGNOSIS — I272 Other secondary pulmonary hypertension: Secondary | ICD-10-CM | POA: Diagnosis not present

## 2015-12-18 DIAGNOSIS — L03019 Cellulitis of unspecified finger: Secondary | ICD-10-CM | POA: Diagnosis not present

## 2015-12-18 DIAGNOSIS — I2510 Atherosclerotic heart disease of native coronary artery without angina pectoris: Secondary | ICD-10-CM | POA: Diagnosis not present

## 2015-12-18 DIAGNOSIS — N182 Chronic kidney disease, stage 2 (mild): Secondary | ICD-10-CM | POA: Diagnosis not present

## 2015-12-18 DIAGNOSIS — E038 Other specified hypothyroidism: Secondary | ICD-10-CM | POA: Diagnosis not present

## 2015-12-18 DIAGNOSIS — K529 Noninfective gastroenteritis and colitis, unspecified: Secondary | ICD-10-CM | POA: Diagnosis not present

## 2015-12-18 DIAGNOSIS — R627 Adult failure to thrive: Secondary | ICD-10-CM | POA: Diagnosis not present

## 2015-12-18 DIAGNOSIS — I119 Hypertensive heart disease without heart failure: Secondary | ICD-10-CM | POA: Diagnosis not present

## 2015-12-18 DIAGNOSIS — I480 Paroxysmal atrial fibrillation: Secondary | ICD-10-CM | POA: Diagnosis not present

## 2016-01-06 DIAGNOSIS — L03011 Cellulitis of right finger: Secondary | ICD-10-CM | POA: Diagnosis not present

## 2016-05-21 DIAGNOSIS — G4489 Other headache syndrome: Secondary | ICD-10-CM | POA: Diagnosis not present

## 2016-05-21 DIAGNOSIS — R1111 Vomiting without nausea: Secondary | ICD-10-CM | POA: Diagnosis not present

## 2016-05-21 DIAGNOSIS — K2970 Gastritis, unspecified, without bleeding: Secondary | ICD-10-CM | POA: Diagnosis not present

## 2016-05-22 ENCOUNTER — Encounter (HOSPITAL_COMMUNITY): Payer: Self-pay | Admitting: Emergency Medicine

## 2016-05-22 ENCOUNTER — Emergency Department (HOSPITAL_COMMUNITY): Payer: Commercial Managed Care - HMO

## 2016-05-22 ENCOUNTER — Emergency Department (HOSPITAL_COMMUNITY)
Admission: EM | Admit: 2016-05-22 | Discharge: 2016-05-22 | Disposition: A | Discharge status: Home / Self Care | Payer: Commercial Managed Care - HMO | Attending: Emergency Medicine | Admitting: Emergency Medicine

## 2016-05-22 DIAGNOSIS — R1084 Generalized abdominal pain: Secondary | ICD-10-CM | POA: Diagnosis not present

## 2016-05-22 DIAGNOSIS — Z8542 Personal history of malignant neoplasm of other parts of uterus: Secondary | ICD-10-CM | POA: Insufficient documentation

## 2016-05-22 DIAGNOSIS — R112 Nausea with vomiting, unspecified: Secondary | ICD-10-CM | POA: Diagnosis not present

## 2016-05-22 DIAGNOSIS — R072 Precordial pain: Secondary | ICD-10-CM | POA: Diagnosis not present

## 2016-05-22 DIAGNOSIS — K449 Diaphragmatic hernia without obstruction or gangrene: Secondary | ICD-10-CM | POA: Diagnosis not present

## 2016-05-22 DIAGNOSIS — R05 Cough: Secondary | ICD-10-CM | POA: Diagnosis not present

## 2016-05-22 DIAGNOSIS — E039 Hypothyroidism, unspecified: Secondary | ICD-10-CM | POA: Diagnosis not present

## 2016-05-22 DIAGNOSIS — Z7901 Long term (current) use of anticoagulants: Secondary | ICD-10-CM | POA: Insufficient documentation

## 2016-05-22 DIAGNOSIS — R1030 Lower abdominal pain, unspecified: Secondary | ICD-10-CM | POA: Diagnosis not present

## 2016-05-22 DIAGNOSIS — R079 Chest pain, unspecified: Secondary | ICD-10-CM | POA: Diagnosis not present

## 2016-05-22 LAB — URINALYSIS, ROUTINE W REFLEX MICROSCOPIC
BILIRUBIN URINE: NEGATIVE
GLUCOSE, UA: NEGATIVE mg/dL
Hgb urine dipstick: NEGATIVE
KETONES UR: NEGATIVE mg/dL
Nitrite: NEGATIVE
PH: 5.5 (ref 5.0–8.0)
Protein, ur: NEGATIVE mg/dL
Specific Gravity, Urine: 1.025 (ref 1.005–1.030)

## 2016-05-22 LAB — HEPATIC FUNCTION PANEL
ALBUMIN: 4.1 g/dL (ref 3.5–5.0)
ALT: 20 U/L (ref 14–54)
AST: 27 U/L (ref 15–41)
Alkaline Phosphatase: 62 U/L (ref 38–126)
BILIRUBIN DIRECT: 0.1 mg/dL (ref 0.1–0.5)
BILIRUBIN INDIRECT: 0.5 mg/dL (ref 0.3–0.9)
BILIRUBIN TOTAL: 0.6 mg/dL (ref 0.3–1.2)
TOTAL PROTEIN: 7.1 g/dL (ref 6.5–8.1)

## 2016-05-22 LAB — URINE MICROSCOPIC-ADD ON

## 2016-05-22 LAB — BASIC METABOLIC PANEL
ANION GAP: 11 (ref 5–15)
BUN: 11 mg/dL (ref 6–20)
CALCIUM: 9.9 mg/dL (ref 8.9–10.3)
CHLORIDE: 102 mmol/L (ref 101–111)
CO2: 19 mmol/L — AB (ref 22–32)
CREATININE: 0.62 mg/dL (ref 0.44–1.00)
GFR calc non Af Amer: >60 mL/min (ref >60)
Glucose, Bld: 159 mg/dL — ABNORMAL HIGH (ref 65–99)
Potassium: 3.5 mmol/L (ref 3.5–5.1)
SODIUM: 132 mmol/L — AB (ref 135–145)

## 2016-05-22 LAB — CBC
HCT: 44.6 % (ref 36.0–46.0)
HEMOGLOBIN: 15 g/dL (ref 12.0–15.0)
MCH: 30.4 pg (ref 26.0–34.0)
MCHC: 33.6 g/dL (ref 30.0–36.0)
MCV: 90.5 fL (ref 78.0–100.0)
PLATELETS: 298 10*3/uL (ref 150–400)
RBC: 4.93 MIL/uL (ref 3.87–5.11)
RDW: 13.2 % (ref 11.5–15.5)
WBC: 10.6 10*3/uL — AB (ref 4.0–10.5)

## 2016-05-22 LAB — I-STAT TROPONIN, ED: TROPONIN I, POC: 0.01 ng/mL (ref 0.00–0.08)

## 2016-05-22 LAB — LIPASE, BLOOD: Lipase: 24 U/L (ref 11–51)

## 2016-05-22 MED ORDER — IOPAMIDOL (ISOVUE-300) INJECTION 61%
INTRAVENOUS | Status: AC
  Administered 2016-05-22: 100 mL
  Filled 2016-05-22: qty 100

## 2016-05-22 MED ORDER — FENTANYL CITRATE (PF) 100 MCG/2ML IJ SOLN
50.0000 ug | Freq: Once | INTRAMUSCULAR | Status: AC
  Administered 2016-05-22: 50 ug via INTRAVENOUS
  Filled 2016-05-22: qty 2

## 2016-05-22 MED ORDER — ONDANSETRON HCL 4 MG/2ML IJ SOLN
4.0000 mg | Freq: Once | INTRAMUSCULAR | Status: AC
  Administered 2016-05-22: 4 mg via INTRAVENOUS
  Filled 2016-05-22: qty 2

## 2016-05-22 MED ORDER — LOPERAMIDE HCL 2 MG PO CAPS
2.0000 mg | ORAL_CAPSULE | Freq: Once | ORAL | Status: AC
  Administered 2016-05-22: 2 mg via ORAL
  Filled 2016-05-22: qty 1

## 2016-05-22 MED ORDER — SODIUM CHLORIDE 0.9 % IV BOLUS (SEPSIS)
500.0000 mL | Freq: Once | INTRAVENOUS | Status: AC
  Administered 2016-05-22: 500 mL via INTRAVENOUS

## 2016-05-22 NOTE — ED Provider Notes (Signed)
[facility] DEPT Provider Note   CSN: [place]:[id number] Arrival date & time: 05/22/16  [id number]  First Provider Contact:   First MD Initiated Contact with Patient 05/22/16 0120     By signing my name below, I, Maud Deed. Royston Sinner and Dolores Hoose, attest that this documentation has been prepared under the direction and in the presence of Ripley Fraise, MD.  Electronically Signed: Maud Deed. Royston Sinner, ED Scribe. 05/22/16. 1:37 AM.   History   Chief Complaint Chief Complaint  Patient presents with  . Abdominal Pain  . Emesis  . Tachycardia  . Chest Pain    The history is provided by the patient and a relative. No language interpreter was used.  Abdominal Pain   This is a new problem. The current episode started 6 to 12 hours ago. The problem occurs constantly. The problem has been gradually worsening. Associated symptoms include nausea, vomiting and arthralgias. Pertinent negatives include hematochezia and melena. Nothing aggravates the symptoms. Nothing relieves the symptoms.    HPI Comments: Teresa Gonzalez, brought in by EMS is a 82 y.o. female with a PMHx of A-Fib who presents to the Emergency Department complaining of worsening, constant chest pain and abdominal pain onset a few hours ago after lunch today. She describes the pain as a "soreness" and "aching". Pt reports associated vomiting, nausea, back pain, and shoulder pain. No aggravating or alleviating factors at this time. Pt was given 4 mg of Zofran en route to department. Per nurse, EMS stated systolic blood pressure dropped from 160 to 120 during orthostatics after pt became dizzy when moving from sitting to standing. Pt denies any melena, hematochezia or vision changes. PSHx includes bowel resection, appendectomy and hysterectomy.   Pt is currently on Eliquis daily.   Past Medical History:  Diagnosis Date  . Depression   . Depression   . Hypothyroidism   . Insomnia   . Left carpal tunnel syndrome    By nerve conduction study  .  Peripheral neuropathy ([place])   . S/P small bowel resection    with Diarrhea  . Status post radiation therapy   . Uterine cancer ([place])   . Vitamin D deficiency     Patient Active Problem List   Diagnosis Date Noted  . Atrial fibrillation ([place])   . Osteoporosis   . SBO (small bowel obstruction) ([place]) 10/20/2013  . Abnormality of gait 01/11/2012  . Disturbance of skin sensation 01/11/2012  . Degeneration of lumbar or lumbosacral intervertebral disc 01/11/2012  . Lumbosacral root lesions, not elsewhere classified 01/11/2012  . Polyneuropathy in other diseases classified elsewhere ([place]) 01/11/2012    Past Surgical History:  Procedure Laterality Date  . ABDOMINAL HYSTERECTOMY    . CATARACT EXTRACTION Bilateral     OB History    No data available       Home Medications    Prior to Admission medications   Medication Sig Start Date End Date Taking? Authorizing Provider  apixaban (ELIQUIS) 2.5 MG TABS tablet Take 1 tablet (2.5 mg total) by mouth 2 (two) times daily. 08/06/15   Cherene Altes, MD  Calcium Carbonate (CALTRATE 600 PO) Take 600 mg by mouth daily.     Historical Provider, MD  dicyclomine (BENTYL) 20 MG tablet Take 1 tablet (20 mg total) by mouth 2 (two) times daily. 11/12/14   Orpah Greek, MD  loperamide (IMODIUM) 2 MG capsule Take 1 capsule (2 mg total) by mouth 5 (five) times daily as needed for diarrhea or loose stools. 10/23/13  Shon Baton, MD  metoprolol tartrate (LOPRESSOR) 25 MG tablet Take 1 tablet (25 mg total) by mouth 2 (two) times daily. 08/06/15   Cherene Altes, MD  Multiple Vitamins-Minerals (MULTIVITAMIN WITH MINERALS) tablet Take 1 tablet by mouth daily.    Historical Provider, MD  NON FORMULARY Take 1 capsule by mouth daily. Juice Plus. Food Supplement.    Historical Provider, MD  omeprazole (PRILOSEC) 40 MG capsule Take 40 mg by mouth daily.    Historical Provider, MD  ondansetron (ZOFRAN) 4 MG tablet Take 1 tablet (4 mg total) by mouth  every 6 (six) hours. 11/12/14   Orpah Greek, MD  raloxifene (EVISTA) 60 MG tablet Take 60 mg by mouth daily.    Historical Provider, MD  SYNTHROID 50 MCG tablet Take 50 mcg by mouth daily. 07/01/15   Historical Provider, MD  VITAMIN B1-B12 IJ Inject 1 mL into the skin every 21 ( twenty-one) days.     Historical Provider, MD  Vitamin D, Ergocalciferol, (DRISDOL) 50000 UNITS CAPS Take 50,000 Units by mouth every 7 (seven) days.     Historical Provider, MD    Family History Family History  Problem Relation Age of Onset  . Stroke Mother   . Aneurysm Father   . Diabetes Brother     Social History Social History  Substance Use Topics  . Smoking status: Never Smoker  . Smokeless tobacco: Never Used  . Alcohol use No     Allergies   Codeine and Penicillins   Review of Systems Review of Systems  Eyes: Negative for visual disturbance.  Cardiovascular: Positive for chest pain.  Gastrointestinal: Positive for abdominal pain, nausea and vomiting. Negative for blood in stool, hematochezia and melena.  Musculoskeletal: Positive for arthralgias and back pain.  All other systems reviewed and are negative.    Physical Exam Updated Vital Signs BP 163/94   Pulse 94   Temp 98.4 F (36.9 C) (Oral)   Resp 20   SpO2 95%   Physical Exam CONSTITUTIONAL: elderly, no acute distress HEAD: Normocephalic/atraumatic EYES: EOMI/PERRL ENMT: Mucous membranes moist NECK: supple no meningeal signs SPINE/BACK:entire spine nontender CV: S1/S2 noted, no murmurs/rubs/gallops noted LUNGS: Lungs are clear to auscultation bilaterally, no apparent distress ABDOMEN: soft, moderate diffuse tenderness, no rebound or guarding, decreased bowel sounds noted  GU:no cva tenderness NEURO: Pt is awake/alert/appropriate, moves all extremitiesx4.  No facial droop.   EXTREMITIES: pulses normal/equal, full ROM SKIN: warm, color normal PSYCH: no abnormalities of mood noted, alert and oriented to  situational  ED Treatments / Results  DIAGNOSTIC STUDIES: Oxygen Saturation is 95% on RA, adequate by my interpretation.    COORDINATION OF CARE: 1:36 AM-Discussed treatment plan with pt at bedside which included X-ray, CT abdomen, and bloodwork and pt agreed to plan.      Labs (all labs ordered are listed, but only abnormal results are displayed) Labs Reviewed  BASIC METABOLIC PANEL - Abnormal; Notable for the following:       Result Value   Sodium 132 (*)    CO2 19 (*)    Glucose, Bld 159 (*)    All other components within normal limits  CBC - Abnormal; Notable for the following:    WBC 10.6 (*)    All other components within normal limits  URINALYSIS, ROUTINE W REFLEX MICROSCOPIC (NOT AT [facility]) - Abnormal; Notable for the following:    Leukocytes, UA TRACE (*)    All other components within normal limits  URINE MICROSCOPIC-ADD ON -  Abnormal; Notable for the following:    Squamous Epithelial / LPF 0-5 (*)    Bacteria, UA FEW (*)    All other components within normal limits  HEPATIC FUNCTION PANEL  LIPASE, BLOOD  I-STAT TROPOININ, ED    EKG  EKG Interpretation  Date/Time:  Saturday May 22 2016 00:59:40 EDT Ventricular Rate:  89 PR Interval:  206 QRS Duration: 126 QT Interval:  422 QTC Calculation: 513 R Axis:   -67 Text Interpretation:  Normal sinus rhythm Left axis deviation Left bundle branch block Abnormal ECG Confirmed by Christy Gentles  MD, Elenore Rota ([id number]) on 05/22/2016 1:19:09 AM       Radiology Ct Abdomen Pelvis W Contrast  Result Date: 05/22/2016 CLINICAL DATA:  Acute onset of substernal chest pain, radiating to the right side of the abdomen. Nausea and vomiting. Dizziness. Initial encounter. EXAM: CT ABDOMEN AND PELVIS WITH CONTRAST TECHNIQUE: Multidetector CT imaging of the abdomen and pelvis was performed using the standard protocol following bolus administration of intravenous contrast. CONTRAST:  1110mL ISOVUE-300 IOPAMIDOL (ISOVUE-300) INJECTION 61%  COMPARISON:  CT of the abdomen and pelvis from 11/12/2014 FINDINGS: Minimal bibasilar opacities may reflect atelectasis or scarring. A small hiatal hernia is noted, filled with fluid. The stomach is largely filled with fluid. Scattered coronary artery calcifications are seen. There is dilatation of the common bile duct to 1.2 cm, raising concern for distal obstruction. No definite underlying mass is seen. No intrahepatic biliary ductal dilatation is seen. The liver and spleen are unremarkable in appearance. The gallbladder is within normal limits. The pancreas and adrenal glands are unremarkable. Scattered bilateral renal cysts are noted. Evaluation for renal or ureteral stones is suboptimal due to contrast within the renal collecting systems. There is no evidence of hydronephrosis. There appears to be a bilobed 2.9 cm isodense lesion at the lateral aspect of the right kidney, relatively stable from 2016 and likely reflecting benign cysts. No perinephric stranding is appreciated. No free fluid is identified. Scattered diverticula are seen about the proximal ileum at the upper pelvis. The remainder of the small bowel is grossly unremarkable. No acute vascular abnormalities are seen. Relatively diffuse calcification is noted along the abdominal aorta and its branches. The appendix is not definitely characterized; there is no evidence of appendicitis. The transverse colon is mildly redundant. The colon is partially filled with stool and is unremarkable in appearance. The bladder is mildly distended and grossly unremarkable in appearance. A small amount of contrast is seen within the bladder. The patient is status post hysterectomy. No suspicious adnexal masses are seen. No inguinal lymphadenopathy is seen. No acute osseous abnormalities are identified. There is mild grade 1 anterolisthesis of L4 on L5, reflecting underlying facet disease. There is also mild grade 1 retrolisthesis of L1 on L2. IMPRESSION: 1. Dilatation  of the common bile duct to 1.2 cm, raising concern for distal obstruction, though no definite underlying mass is seen, and no intrahepatic biliary ductal dilatation is seen. This is more prominent than in 2016. Further evaluation could be considered if deemed clinically appropriate. 2. Small hiatal hernia, filled with fluid. 3. Scattered bilateral renal cysts. Bilobed 2.9 cm isodense lesion at the lateral aspect of the right kidney is relatively stable from 2016 and likely reflects benign cysts. 4. Scattered diverticula about the proximal ileum at the upper pelvis. 5. Minimal bibasilar airspace opacities may reflect atelectasis or scarring. 6. Scattered coronary artery calcifications seen. 7. Relatively diffuse calcification along the abdominal aorta and its branches. Electronically Signed  By: Garald Balding M.D.   On: 05/22/2016 04:06   Dg Chest Portable 1 View  Result Date: 05/22/2016 CLINICAL DATA:  Chest pain shortness breath tonight.  Dry cough. EXAM: PORTABLE CHEST 1 VIEW COMPARISON:  08/02/2015. FINDINGS: Normal sized heart. A small amount of linear density at the left lung base with improvement. Clear right lung. Minimal diffuse peribronchial thickening. Aortic calcifications. Mild scoliosis. IMPRESSION: 1. Mild left basilar linear atelectasis or scarring. 2. Minimal bronchitic changes. Electronically Signed   By: Claudie Revering M.D.   On: 05/22/2016 02:01    Procedures Procedures (including critical care time)  Medications Ordered in ED Medications  loperamide (IMODIUM) capsule 2 mg (not administered)  sodium chloride 0.9 % bolus 500 mL (0 mLs Intravenous Stopped 05/22/16 0518)  fentaNYL (SUBLIMAZE) injection 50 mcg (50 mcg Intravenous Given 05/22/16 0313)  iopamidol (ISOVUE-300) 61 % injection (100 mLs  Contrast Given 05/22/16 0241)  fentaNYL (SUBLIMAZE) injection 50 mcg (50 mcg Intravenous Given 05/22/16 0531)  ondansetron (ZOFRAN) injection 4 mg (4 mg Intravenous Given 05/22/16 0530)      Initial Impression / Assessment and Plan / ED Course  I have reviewed the triage vital signs and the nursing notes.  Pertinent labs & imaging results that were available during my care of the patient were reviewed by me and considered in my medical decision making (see chart for details).  Clinical Course    Pt  Observed for several hours She felt improved For CP - appeared atypical and her main issue was abdominal pain and she had reproducible pain on exam CT imaging negative for acute disease, ?CBD dilation though no other acute findings.  I feel this can be worked up as an outpatient.  She has no focal RUQ tenderness  She is now improved She had some loose/frequent stools which states is common and she requests imodium   Final Clinical Impressions(s) / ED Diagnoses   Final diagnoses:  Non-intractable vomiting with nausea, vomiting of unspecified type  Lower abdominal pain    New Prescriptions New Prescriptions   No medications on file  I personally performed the services described in this documentation, which was scribed in my presence. The recorded information has been reviewed and is accurate.        Ripley Fraise, MD 05/22/16 [phone]

## 2016-05-22 NOTE — Discharge Instructions (Signed)
°  SEEK IMMEDIATE MEDICAL ATTENTION IF: °The pain does not go away or becomes severe, particularly over the next 8-12 hours.  °A temperature above 100.4F develops.  °Repeated vomiting occurs (multiple episodes).  °The pain becomes localized to portions of the abdomen.  In an adult, the left lower portion of the abdomen could be colitis or diverticulitis.  °Blood is being passed in stools or vomit (bright red or black tarry stools).  °Return also if you develop chest pain, difficulty breathing, dizziness or fainting, or become confused, poorly responsive, or inconsolable. ° °

## 2016-05-22 NOTE — ED Triage Notes (Signed)
Pt presents from with [facility] for abd pain with N/V since yesterday; pt also reporting substernal CP that radiates to the RIGHT side that began at 3pm yesterday; EMS reports pt became dizzy when changing positions on scene from sitting to standing orthostatic VS changes from 0000000 systolic to 123456 systolic (HR remained same); pt given 4mg  zofran IV enroute

## 2016-05-25 ENCOUNTER — Emergency Department (HOSPITAL_COMMUNITY): Payer: Commercial Managed Care - HMO

## 2016-05-25 ENCOUNTER — Emergency Department (HOSPITAL_COMMUNITY)
Admission: EM | Admit: 2016-05-25 | Discharge: 2016-05-26 | Disposition: A | Discharge status: Home / Self Care | Payer: Commercial Managed Care - HMO | Attending: Emergency Medicine | Admitting: Emergency Medicine

## 2016-05-25 ENCOUNTER — Encounter (HOSPITAL_COMMUNITY): Payer: Self-pay | Admitting: Emergency Medicine

## 2016-05-25 DIAGNOSIS — S0993XA Unspecified injury of face, initial encounter: Secondary | ICD-10-CM | POA: Diagnosis present

## 2016-05-25 DIAGNOSIS — Z8542 Personal history of malignant neoplasm of other parts of uterus: Secondary | ICD-10-CM | POA: Diagnosis not present

## 2016-05-25 DIAGNOSIS — S199XXA Unspecified injury of neck, initial encounter: Secondary | ICD-10-CM | POA: Diagnosis not present

## 2016-05-25 DIAGNOSIS — Y990 Civilian activity done for income or pay: Secondary | ICD-10-CM | POA: Diagnosis not present

## 2016-05-25 DIAGNOSIS — W19XXXA Unspecified fall, initial encounter: Secondary | ICD-10-CM

## 2016-05-25 DIAGNOSIS — W228XXA Striking against or struck by other objects, initial encounter: Secondary | ICD-10-CM | POA: Insufficient documentation

## 2016-05-25 DIAGNOSIS — S098XXA Other specified injuries of head, initial encounter: Secondary | ICD-10-CM | POA: Diagnosis not present

## 2016-05-25 DIAGNOSIS — M7989 Other specified soft tissue disorders: Secondary | ICD-10-CM | POA: Diagnosis not present

## 2016-05-25 DIAGNOSIS — S01111A Laceration without foreign body of right eyelid and periocular area, initial encounter: Secondary | ICD-10-CM | POA: Diagnosis not present

## 2016-05-25 DIAGNOSIS — Y939 Activity, unspecified: Secondary | ICD-10-CM | POA: Diagnosis not present

## 2016-05-25 DIAGNOSIS — E039 Hypothyroidism, unspecified: Secondary | ICD-10-CM | POA: Diagnosis not present

## 2016-05-25 DIAGNOSIS — S0181XA Laceration without foreign body of other part of head, initial encounter: Secondary | ICD-10-CM | POA: Insufficient documentation

## 2016-05-25 DIAGNOSIS — Y929 Unspecified place or not applicable: Secondary | ICD-10-CM | POA: Diagnosis not present

## 2016-05-25 DIAGNOSIS — Z7901 Long term (current) use of anticoagulants: Secondary | ICD-10-CM | POA: Insufficient documentation

## 2016-05-25 DIAGNOSIS — G44209 Tension-type headache, unspecified, not intractable: Secondary | ICD-10-CM | POA: Diagnosis not present

## 2016-05-25 DIAGNOSIS — M25531 Pain in right wrist: Secondary | ICD-10-CM | POA: Diagnosis not present

## 2016-05-25 DIAGNOSIS — R9431 Abnormal electrocardiogram [ECG] [EKG]: Secondary | ICD-10-CM | POA: Diagnosis not present

## 2016-05-25 DIAGNOSIS — R42 Dizziness and giddiness: Secondary | ICD-10-CM | POA: Diagnosis not present

## 2016-05-25 DIAGNOSIS — IMO0002 Reserved for concepts with insufficient information to code with codable children: Secondary | ICD-10-CM

## 2016-05-25 LAB — CBC
HEMATOCRIT: 39.7 % (ref 36.0–46.0)
Hemoglobin: 13.1 g/dL (ref 12.0–15.0)
MCH: 30.3 pg (ref 26.0–34.0)
MCHC: 33 g/dL (ref 30.0–36.0)
MCV: 91.7 fL (ref 78.0–100.0)
PLATELETS: 271 10*3/uL (ref 150–400)
RBC: 4.33 MIL/uL (ref 3.87–5.11)
RDW: 13.4 % (ref 11.5–15.5)
WBC: 8.3 10*3/uL (ref 4.0–10.5)

## 2016-05-25 LAB — BASIC METABOLIC PANEL
Anion gap: 9 (ref 5–15)
BUN: 13 mg/dL (ref 6–20)
CHLORIDE: 102 mmol/L (ref 101–111)
CO2: 26 mmol/L (ref 22–32)
CREATININE: 0.65 mg/dL (ref 0.44–1.00)
Calcium: 9.1 mg/dL (ref 8.9–10.3)
GFR calc Af Amer: >60 mL/min (ref >60)
GFR calc non Af Amer: >60 mL/min (ref >60)
GLUCOSE: 99 mg/dL (ref 65–99)
Potassium: 3.3 mmol/L — ABNORMAL LOW (ref 3.5–5.1)
SODIUM: 137 mmol/L (ref 135–145)

## 2016-05-25 MED ORDER — LIDOCAINE HCL 2 % IJ SOLN
10.0000 mL | Freq: Once | INTRAMUSCULAR | Status: DC
  Filled 2016-05-25: qty 20

## 2016-05-25 MED ORDER — LIDOCAINE-EPINEPHRINE (PF) 2 %-1:200000 IJ SOLN
10.0000 mL | Freq: Once | INTRAMUSCULAR | Status: AC
  Administered 2016-05-25: 10 mL via INTRADERMAL
  Filled 2016-05-25: qty 20

## 2016-05-25 MED ORDER — LIDOCAINE HCL (PF) 1 % IJ SOLN
INTRAMUSCULAR | Status: DC
Start: 2016-05-25 — End: 2016-05-26
  Filled 2016-05-25: qty 30

## 2016-05-25 NOTE — ED Notes (Signed)
Pt in radiology, family in room and is updated

## 2016-05-25 NOTE — ED Triage Notes (Signed)
Pt is on elaquis for Afib. Pt at home- stood up to go to restroom in a hurry  and fell to floor. Pt did not pass out. Struck head on portable heater. Pt has lac above right eye.  Pt denies neck and back pain, but does have right wrist pain. Pt BP 170/100, CBG 104, LBBB

## 2016-05-25 NOTE — ED Notes (Signed)
Pt with bleeding to lac to R side of forehead, pressure dressing applied

## 2016-05-25 NOTE — ED Notes (Signed)
Bleeding controlled at this time by bandage on pt's head

## 2016-05-25 NOTE — ED Provider Notes (Signed)
[facility] DEPT Provider Note   CSN: [id number] Arrival date & time: 05/25/16  1636     History   Chief Complaint Chief Complaint  Patient presents with  . Fall    HPI Teresa Gonzalez is a 82 y.o. female.   Fall  This is a recurrent problem. Episode onset: prior to arrival. Episode frequency: occasionally. The problem has been resolved. Pertinent negatives include no chest pain, no abdominal pain, no headaches and no shortness of breath. Nothing aggravates the symptoms. Nothing relieves the symptoms. Treatments tried: gauze to forehead. The treatment provided mild relief.    Past Medical History:  Diagnosis Date  . Depression   . Depression   . Hypothyroidism   . Insomnia   . Left carpal tunnel syndrome    By nerve conduction study  . Peripheral neuropathy ([place])   . S/P small bowel resection    with Diarrhea  . Status post radiation therapy   . Uterine cancer ([place])   . Vitamin D deficiency     Patient Active Problem List   Diagnosis Date Noted  . Atrial fibrillation ([place])   . Osteoporosis   . SBO (small bowel obstruction) ([place]) 10/20/2013  . Abnormality of gait 01/11/2012  . Disturbance of skin sensation 01/11/2012  . Degeneration of lumbar or lumbosacral intervertebral disc 01/11/2012  . Lumbosacral root lesions, not elsewhere classified 01/11/2012  . Polyneuropathy in other diseases classified elsewhere ([place]) 01/11/2012    Past Surgical History:  Procedure Laterality Date  . ABDOMINAL HYSTERECTOMY    . CATARACT EXTRACTION Bilateral     OB History    No data available       Home Medications    Prior to Admission medications   Medication Sig Start Date End Date Taking? Authorizing Provider  apixaban (ELIQUIS) 2.5 MG TABS tablet Take 1 tablet (2.5 mg total) by mouth 2 (two) times daily. 08/06/15   Cherene Altes, MD  Calcium Carbonate (CALTRATE 600 PO) Take 600 mg by mouth daily.     Historical Provider, MD  Cholecalciferol (VITAMIN D3) 1000  units CAPS Take 1,000 Units by mouth daily.    Historical Provider, MD  dicyclomine (BENTYL) 20 MG tablet Take 1 tablet (20 mg total) by mouth 2 (two) times daily. Patient not taking: Reported on 05/22/2016 11/12/14   Orpah Greek, MD  loperamide (IMODIUM) 2 MG capsule Take 1 capsule (2 mg total) by mouth 5 (five) times daily as needed for diarrhea or loose stools. 10/23/13   Shon Baton, MD  metoprolol tartrate (LOPRESSOR) 25 MG tablet Take 1 tablet (25 mg total) by mouth 2 (two) times daily. 08/06/15   Cherene Altes, MD  Multiple Vitamins-Minerals (MULTIVITAMIN WITH MINERALS) tablet Take 1 tablet by mouth daily.    Historical Provider, MD  NON FORMULARY Take 1 capsule by mouth daily. Juice Plus. Food Supplement.    Historical Provider, MD  omeprazole (PRILOSEC) 40 MG capsule Take 40 mg by mouth daily.    Historical Provider, MD  ondansetron (ZOFRAN) 4 MG tablet Take 1 tablet (4 mg total) by mouth every 6 (six) hours. Patient not taking: Reported on 05/22/2016 11/12/14   Orpah Greek, MD  raloxifene (EVISTA) 60 MG tablet Take 60 mg by mouth daily.    Historical Provider, MD  SYNTHROID 50 MCG tablet Take 50 mcg by mouth daily. 07/01/15   Historical Provider, MD  VITAMIN B1-B12 IJ Inject 1 mL into the skin every 21 ( twenty-one) days.  Historical Provider, MD  Vitamin D, Ergocalciferol, (DRISDOL) 50000 UNITS CAPS Take 50,000 Units by mouth See admin instructions. Takes on Monday, Wednesday, Friday and Sunday    Historical Provider, MD    Family History Family History  Problem Relation Age of Onset  . Stroke Mother   . Aneurysm Father   . Diabetes Brother     Social History Social History  Substance Use Topics  . Smoking status: Never Smoker  . Smokeless tobacco: Never Used  . Alcohol use No     Allergies   Codeine and Penicillins   Review of Systems Review of Systems  Constitutional: Negative.   HENT: Negative.   Eyes: Negative.   Respiratory: Negative for  cough and shortness of breath.   Cardiovascular: Negative for chest pain.  Gastrointestinal: Negative for abdominal pain, nausea and vomiting.  Genitourinary: Negative.   Musculoskeletal: Positive for back pain (mild chronic, no change today).  Skin: Negative.   Neurological: Positive for dizziness (chronic, not worse today). Negative for syncope, speech difficulty, light-headedness and headaches.  Psychiatric/Behavioral: Negative for agitation and confusion. The patient is not nervous/anxious.      Physical Exam Updated Vital Signs BP 171/82   Pulse 88   Resp 16   SpO2 100%   Physical Exam  Constitutional: She is oriented to person, place, and time. She appears well-developed and well-nourished. No distress.  HENT:  Head: Normocephalic.  Right Ear: External ear normal.  Left Ear: External ear normal.  Forehead laceration No other TTP or instability. No epistaxis, no oropharynx bleeding  Eyes: Conjunctivae and EOM are normal. Pupils are equal, round, and reactive to light.  No signs of occular entrapment  Neck: Neck supple. No tracheal deviation present.  No TTP to C spine  Cardiovascular: Normal rate, regular rhythm and intact distal pulses.   Murmur (mild systolic murmur at base of heart) heard. Pulmonary/Chest: Effort normal and breath sounds normal. No stridor. No respiratory distress. She has no wheezes. She has no rales. She exhibits no tenderness.  Abdominal: Soft. She exhibits no distension. There is no tenderness. There is no rebound and no guarding.  Musculoskeletal: She exhibits tenderness (mild R wrist tenderness). She exhibits no edema or deformity.  Neurological: She is alert and oriented to person, place, and time. She has normal strength. No cranial nerve deficit or sensory deficit. GCS eye subscore is 4. GCS verbal subscore is 5. GCS motor subscore is 6.  Skin: Skin is warm and dry. Capillary refill takes less than 2 seconds. She is not diaphoretic.  Ecchymosis  along R periorbital area  Psychiatric: She has a normal mood and affect. Her behavior is normal. Judgment and thought content normal.  Nursing note and vitals reviewed.    ED Treatments / Results  Labs (all labs ordered are listed, but only abnormal results are displayed) Labs Reviewed  BASIC METABOLIC PANEL - Abnormal; Notable for the following:       Result Value   Potassium 3.3 (*)    All other components within normal limits  CBC    EKG  EKG Interpretation  Date/Time:  Tuesday May 25 2016 16:42:43 EDT Ventricular Rate:  87 PR Interval:    QRS Duration: 131 QT Interval:  405 QTC Calculation: 488 R Axis:   -58 Text Interpretation:  Sinus rhythm Prolonged PR interval Left bundle branch block No significant change since last tracing Confirmed by LIU MD, DANA [id number]) on 05/25/2016 4:54:43 PM       Radiology No results  found.  Procedures .Marland KitchenLaceration Repair Date/Time: 05/27/2016 10:35 PM Performed by: Darlina Rumpf Authorized by: Brantley Stage DUO   Consent:    Consent obtained:  Verbal   Consent given by:  Patient   Risks discussed:  Infection, need for additional repair, nerve damage, pain, vascular damage, poor wound healing and poor cosmetic result   Alternatives discussed:  Observation Universal protocol:    Procedure explained and questions answered to patient or proxy's satisfaction: yes     Imaging studies available: yes     Patient identity confirmed:  Arm band Anesthesia (see MAR for exact dosages):    Anesthesia method:  Local infiltration   Local anesthetic:  Lidocaine 2% WITH epi Laceration details:    Location:  Face   Face location:  Forehead   Wound length (cm): 3 segments of 1.5 cm long.   Depth (mm):  5 Repair type:    Repair type:  Intermediate (stellate wound with arterial bleeding needing to be controlled) Exploration:    Hemostasis achieved with:  Direct pressure and epinephrine   Wound exploration: wound explored through full range of  motion and entire depth of wound probed and visualized     Wound extent: vascular damage (bleeding (arterial))     Wound extent: no fascia violation noted, no foreign bodies/material noted, no muscle damage noted, no nerve damage noted and no underlying fracture noted     Contaminated: no   Treatment:    Area cleansed with:  Saline   Amount of cleaning:  Standard   Irrigation solution:  Sterile water   Irrigation volume:  300cc   Irrigation method:  Syringe Skin repair:    Repair method:  Sutures   Suture size:  5-0   Suture material:  Prolene   Suture technique:  Simple interrupted   Number of sutures:  4 Approximation:    Approximation:  Close Post-procedure details:    Dressing:  Antibiotic ointment (gauze and coban to prevent further oozing)   Patient tolerance of procedure:  Tolerated well, no immediate complications   (including critical care time)  Medications Ordered in ED Medications  lidocaine-EPINEPHrine (XYLOCAINE W/EPI) 2 %-1:200000 (PF) injection 10 mL (10 mLs Intradermal Given by Other 05/25/16 1957)     Initial Impression / Assessment and Plan / ED Course  I have reviewed the triage vital signs and the nursing notes.  Pertinent labs & imaging results that were available during my care of the patient were reviewed by me and considered in my medical decision making (see chart for details).  Clinical Course   82 year old female on Eliquis for atrial fibrillation presents to the emergency department after a fall.  Patient states that it was mechanical and did not have a syncopal component to it.  No loss of consciousness.  On examination only visible injury is a right frontal stellate laceration that has an arterial bleeding component to it.  Bleeding stopped and ligation was not necessary.  Laceration repair as above.  Prior to repair, CT head and neck was performed and were unremarkable for acute fractures or intracranial injuries.  Patient denied any neck or back  pain.  Had right wrist pain without significant deformity, thus right wrist x-ray was obtained and was within normal limits.  Basic blood work was grossly unremarkable.  Unfortunately due to acuity in the emergency department, repair was delayed.  Once completed however, patient was ambulated without difficulty.  Discussed basic wound care with patient and daughter at bedside.  All questions answered.  Encouraged patient to follow-up with primary care doctor for suture removal on Monday.  Usual and customary return precautions after laceration repair were discussed.  Patient and family member stated understanding and are in agreement with plan.   Final Clinical Impressions(s) / ED Diagnoses   Final diagnoses:  Fall, initial encounter  Laceration    New Prescriptions New Prescriptions   No medications on file     Darlina Rumpf, MD 05/27/16 [place] Liu, MD 05/28/16 [phone]

## 2016-05-26 MED ORDER — BACITRACIN ZINC 500 UNIT/GM EX OINT: TOPICAL_OINTMENT | Freq: Once | CUTANEOUS | Status: DC

## 2016-05-26 NOTE — ED Notes (Signed)
Pt provided with d/c instructions at this time. Pt verbalizes understanding of d/c instructions as well as follow up procedure after d/c.  No new RX at time of d/c.  Pt in no apparent distress at this time. Pt assisted out of the ED via WC at this time.   

## 2016-05-26 NOTE — ED Provider Notes (Signed)
I saw and evaluated the patient, reviewed the resident's note and I agree with the findings and plan.   EKG Interpretation  Date/Time:  Tuesday May 25 2016 16:42:43 EDT Ventricular Rate:  87 PR Interval:    QRS Duration: 131 QT Interval:  405 QTC Calculation: 488 R Axis:   -58 Text Interpretation:  Sinus rhythm Prolonged PR interval Left bundle branch block No significant change since last tracing Confirmed by Kacey Dysert MD, Brendan Gadson [id number]) on 05/25/2016 4:54:43 PM      Procedure(s) performed: Laceration repair. I confirm that I have examined the patient, was present during the key aspects of the procedure.   I have independently reviewed the following tracings and/or images and used them in my medical decision making: Ct head and cervical spine. x0ray wrist  82 year old female with history atrial fibrillation on Eliquis who presents after mechanical fall. States she is normally unsteady on her feet, stood up too quickly after getting up from sitting today, lost balance and fell back and hit her head forward on a portable heater. No LOC. Denies syncope or near syncope prior to fall. Sustained laceration to forehead. No neck pain, confusion, n/v, chest pain, difficulty breathing, back pain, abdominal pain, numbness, weakness. She is neuro in tact. Well appearing and in no acute distress. Stellate laceration to forehead with small arterial bleeder. Pressure and compression dressing applied. Laceration eventually sutured with control of bleeding. CT head and cspine negative for injury. She is able to ambulate steadily prior to d/c.      Forde Dandy, MD 05/26/16 [phone]

## 2016-05-26 NOTE — ED Notes (Signed)
Pt ambulatory with stand by assist 80 feet without difficulty.

## 2016-05-31 DIAGNOSIS — Z4802 Encounter for removal of sutures: Secondary | ICD-10-CM | POA: Diagnosis not present

## 2016-05-31 DIAGNOSIS — Z681 Body mass index (BMI) 19 or less, adult: Secondary | ICD-10-CM | POA: Diagnosis not present

## 2016-05-31 DIAGNOSIS — I480 Paroxysmal atrial fibrillation: Secondary | ICD-10-CM | POA: Diagnosis not present

## 2016-05-31 DIAGNOSIS — R42 Dizziness and giddiness: Secondary | ICD-10-CM | POA: Diagnosis not present

## 2016-05-31 DIAGNOSIS — K529 Noninfective gastroenteritis and colitis, unspecified: Secondary | ICD-10-CM | POA: Diagnosis not present

## 2016-06-28 DIAGNOSIS — E038 Other specified hypothyroidism: Secondary | ICD-10-CM | POA: Diagnosis not present

## 2016-06-28 DIAGNOSIS — E7800 Pure hypercholesterolemia, unspecified: Secondary | ICD-10-CM | POA: Diagnosis not present

## 2016-06-28 DIAGNOSIS — Z0000 Encounter for general adult medical examination without abnormal findings: Secondary | ICD-10-CM | POA: Diagnosis not present

## 2016-06-28 DIAGNOSIS — E538 Deficiency of other specified B group vitamins: Secondary | ICD-10-CM | POA: Diagnosis not present

## 2016-06-28 DIAGNOSIS — E559 Vitamin D deficiency, unspecified: Secondary | ICD-10-CM | POA: Diagnosis not present

## 2016-06-28 DIAGNOSIS — I10 Essential (primary) hypertension: Secondary | ICD-10-CM | POA: Diagnosis not present

## 2016-06-28 DIAGNOSIS — N390 Urinary tract infection, site not specified: Secondary | ICD-10-CM | POA: Diagnosis not present

## 2016-07-05 DIAGNOSIS — Z0000 Encounter for general adult medical examination without abnormal findings: Secondary | ICD-10-CM | POA: Diagnosis not present

## 2016-07-05 DIAGNOSIS — E46 Unspecified protein-calorie malnutrition: Secondary | ICD-10-CM | POA: Diagnosis not present

## 2016-07-05 DIAGNOSIS — I2510 Atherosclerotic heart disease of native coronary artery without angina pectoris: Secondary | ICD-10-CM | POA: Diagnosis not present

## 2016-07-05 DIAGNOSIS — I119 Hypertensive heart disease without heart failure: Secondary | ICD-10-CM | POA: Diagnosis not present

## 2016-07-05 DIAGNOSIS — I272 Other secondary pulmonary hypertension: Secondary | ICD-10-CM | POA: Diagnosis not present

## 2016-07-05 DIAGNOSIS — N182 Chronic kidney disease, stage 2 (mild): Secondary | ICD-10-CM | POA: Diagnosis not present

## 2016-07-05 DIAGNOSIS — I480 Paroxysmal atrial fibrillation: Secondary | ICD-10-CM | POA: Diagnosis not present

## 2016-07-05 DIAGNOSIS — I130 Hypertensive heart and chronic kidney disease with heart failure and stage 1 through stage 4 chronic kidney disease, or unspecified chronic kidney disease: Secondary | ICD-10-CM | POA: Diagnosis not present

## 2016-07-05 DIAGNOSIS — I428 Other cardiomyopathies: Secondary | ICD-10-CM | POA: Diagnosis not present

## 2016-11-08 DIAGNOSIS — I428 Other cardiomyopathies: Secondary | ICD-10-CM | POA: Diagnosis not present

## 2016-11-08 DIAGNOSIS — I130 Hypertensive heart and chronic kidney disease with heart failure and stage 1 through stage 4 chronic kidney disease, or unspecified chronic kidney disease: Secondary | ICD-10-CM | POA: Diagnosis not present

## 2016-11-08 DIAGNOSIS — D692 Other nonthrombocytopenic purpura: Secondary | ICD-10-CM | POA: Diagnosis not present

## 2016-11-08 DIAGNOSIS — L2089 Other atopic dermatitis: Secondary | ICD-10-CM | POA: Diagnosis not present

## 2016-11-08 DIAGNOSIS — I2789 Other specified pulmonary heart diseases: Secondary | ICD-10-CM | POA: Diagnosis not present

## 2016-11-08 DIAGNOSIS — I119 Hypertensive heart disease without heart failure: Secondary | ICD-10-CM | POA: Diagnosis not present

## 2016-11-08 DIAGNOSIS — I480 Paroxysmal atrial fibrillation: Secondary | ICD-10-CM | POA: Diagnosis not present

## 2016-11-08 DIAGNOSIS — Z7901 Long term (current) use of anticoagulants: Secondary | ICD-10-CM | POA: Diagnosis not present

## 2016-11-08 DIAGNOSIS — I2510 Atherosclerotic heart disease of native coronary artery without angina pectoris: Secondary | ICD-10-CM | POA: Diagnosis not present

## 2016-11-15 ENCOUNTER — Observation Stay (HOSPITAL_COMMUNITY)
Admission: EM | Admit: 2016-11-15 | Discharge: 2016-11-16 | Disposition: A | Discharge status: Home / Self Care | Payer: Medicare HMO | Attending: Internal Medicine | Admitting: Internal Medicine

## 2016-11-15 ENCOUNTER — Emergency Department (HOSPITAL_COMMUNITY): Payer: Medicare HMO

## 2016-11-15 ENCOUNTER — Encounter (HOSPITAL_COMMUNITY): Payer: Self-pay | Admitting: Emergency Medicine

## 2016-11-15 DIAGNOSIS — E039 Hypothyroidism, unspecified: Secondary | ICD-10-CM | POA: Diagnosis not present

## 2016-11-15 DIAGNOSIS — Z8542 Personal history of malignant neoplasm of other parts of uterus: Secondary | ICD-10-CM | POA: Insufficient documentation

## 2016-11-15 DIAGNOSIS — Z79899 Other long term (current) drug therapy: Secondary | ICD-10-CM | POA: Insufficient documentation

## 2016-11-15 DIAGNOSIS — R269 Unspecified abnormalities of gait and mobility: Secondary | ICD-10-CM | POA: Diagnosis not present

## 2016-11-15 DIAGNOSIS — M810 Age-related osteoporosis without current pathological fracture: Secondary | ICD-10-CM | POA: Diagnosis present

## 2016-11-15 DIAGNOSIS — I4891 Unspecified atrial fibrillation: Secondary | ICD-10-CM | POA: Diagnosis present

## 2016-11-15 DIAGNOSIS — R079 Chest pain, unspecified: Secondary | ICD-10-CM

## 2016-11-15 DIAGNOSIS — R209 Unspecified disturbances of skin sensation: Secondary | ICD-10-CM | POA: Diagnosis present

## 2016-11-15 DIAGNOSIS — Z923 Personal history of irradiation: Secondary | ICD-10-CM | POA: Insufficient documentation

## 2016-11-15 DIAGNOSIS — F329 Major depressive disorder, single episode, unspecified: Secondary | ICD-10-CM | POA: Insufficient documentation

## 2016-11-15 DIAGNOSIS — R0602 Shortness of breath: Secondary | ICD-10-CM | POA: Diagnosis not present

## 2016-11-15 DIAGNOSIS — K56609 Unspecified intestinal obstruction, unspecified as to partial versus complete obstruction: Secondary | ICD-10-CM | POA: Insufficient documentation

## 2016-11-15 DIAGNOSIS — I480 Paroxysmal atrial fibrillation: Secondary | ICD-10-CM | POA: Insufficient documentation

## 2016-11-15 DIAGNOSIS — Z7901 Long term (current) use of anticoagulants: Secondary | ICD-10-CM | POA: Insufficient documentation

## 2016-11-15 DIAGNOSIS — Z880 Allergy status to penicillin: Secondary | ICD-10-CM | POA: Diagnosis not present

## 2016-11-15 DIAGNOSIS — K529 Noninfective gastroenteritis and colitis, unspecified: Secondary | ICD-10-CM | POA: Diagnosis not present

## 2016-11-15 DIAGNOSIS — Z66 Do not resuscitate: Secondary | ICD-10-CM | POA: Diagnosis not present

## 2016-11-15 DIAGNOSIS — Z7982 Long term (current) use of aspirin: Secondary | ICD-10-CM | POA: Insufficient documentation

## 2016-11-15 DIAGNOSIS — G63 Polyneuropathy in diseases classified elsewhere: Secondary | ICD-10-CM | POA: Diagnosis not present

## 2016-11-15 DIAGNOSIS — G4700 Insomnia, unspecified: Secondary | ICD-10-CM | POA: Diagnosis not present

## 2016-11-15 DIAGNOSIS — G629 Polyneuropathy, unspecified: Secondary | ICD-10-CM | POA: Insufficient documentation

## 2016-11-15 DIAGNOSIS — E876 Hypokalemia: Secondary | ICD-10-CM | POA: Insufficient documentation

## 2016-11-15 DIAGNOSIS — R0789 Other chest pain: Principal | ICD-10-CM | POA: Insufficient documentation

## 2016-11-15 LAB — CBC WITH DIFFERENTIAL/PLATELET
Basophils Absolute: 0 10*3/uL (ref 0.0–0.1)
Basophils Relative: 0 %
Eosinophils Absolute: 0 10*3/uL (ref 0.0–0.7)
Eosinophils Relative: 0 %
HEMATOCRIT: 39.1 % (ref 36.0–46.0)
HEMOGLOBIN: 13.4 g/dL (ref 12.0–15.0)
LYMPHS ABS: 1.4 10*3/uL (ref 0.7–4.0)
Lymphocytes Relative: 15 %
MCH: 30.5 pg (ref 26.0–34.0)
MCHC: 34.3 g/dL (ref 30.0–36.0)
MCV: 88.9 fL (ref 78.0–100.0)
MONOS PCT: 5 %
Monocytes Absolute: 0.4 10*3/uL (ref 0.1–1.0)
NEUTROS PCT: 80 %
Neutro Abs: 7.8 10*3/uL — ABNORMAL HIGH (ref 1.7–7.7)
Platelets: 298 10*3/uL (ref 150–400)
RBC: 4.4 MIL/uL (ref 3.87–5.11)
RDW: 13.3 % (ref 11.5–15.5)
WBC: 9.7 10*3/uL (ref 4.0–10.5)

## 2016-11-15 LAB — BASIC METABOLIC PANEL
ANION GAP: 14 (ref 5–15)
BUN: 15 mg/dL (ref 6–20)
CHLORIDE: 100 mmol/L — AB (ref 101–111)
CO2: 20 mmol/L — AB (ref 22–32)
Calcium: 8.5 mg/dL — ABNORMAL LOW (ref 8.9–10.3)
Creatinine, Ser: 0.81 mg/dL (ref 0.44–1.00)
GFR calc non Af Amer: >60 mL/min (ref >60)
Glucose, Bld: 109 mg/dL — ABNORMAL HIGH (ref 65–99)
POTASSIUM: 3.4 mmol/L — AB (ref 3.5–5.1)
SODIUM: 134 mmol/L — AB (ref 135–145)

## 2016-11-15 LAB — TROPONIN I: Troponin I: <0.03 ng/mL (ref <0.03)

## 2016-11-15 LAB — I-STAT TROPONIN, ED: TROPONIN I, POC: 0.01 ng/mL (ref 0.00–0.08)

## 2016-11-15 MED ORDER — ONDANSETRON HCL 4 MG/2ML IJ SOLN: 4.0000 mg | Freq: Four times a day (QID) | INTRAMUSCULAR | Status: DC | PRN

## 2016-11-15 MED ORDER — LEVOTHYROXINE SODIUM 50 MCG PO TABS
50.0000 ug | ORAL_TABLET | Freq: Every day | ORAL | Status: DC
  Administered 2016-11-15 – 2016-11-16 (×2): 50 ug via ORAL
  Filled 2016-11-15 (×2): qty 1

## 2016-11-15 MED ORDER — APIXABAN 2.5 MG PO TABS
2.5000 mg | ORAL_TABLET | Freq: Two times a day (BID) | ORAL | Status: DC
  Administered 2016-11-15 – 2016-11-16 (×3): 2.5 mg via ORAL
  Filled 2016-11-15 (×3): qty 1

## 2016-11-15 MED ORDER — MORPHINE SULFATE (PF) 4 MG/ML IV SOLN
4.0000 mg | Freq: Once | INTRAVENOUS | Status: AC
  Administered 2016-11-15: 4 mg via INTRAVENOUS
  Filled 2016-11-15: qty 1

## 2016-11-15 MED ORDER — GI COCKTAIL ~~LOC~~: 30.0000 mL | Freq: Four times a day (QID) | ORAL | Status: DC | PRN

## 2016-11-15 MED ORDER — LOPERAMIDE HCL 2 MG PO CAPS
2.0000 mg | ORAL_CAPSULE | Freq: Every day | ORAL | Status: DC | PRN
  Administered 2016-11-15 – 2016-11-16 (×4): 2 mg via ORAL
  Filled 2016-11-15 (×4): qty 1

## 2016-11-15 MED ORDER — GABAPENTIN 100 MG PO CAPS
100.0000 mg | ORAL_CAPSULE | Freq: Three times a day (TID) | ORAL | Status: DC
  Administered 2016-11-15 – 2016-11-16 (×2): 100 mg via ORAL
  Filled 2016-11-15 (×4): qty 1

## 2016-11-15 MED ORDER — CALCIUM CARBONATE 1500 (600 CA) MG PO TABS: 600.0000 mg | ORAL_TABLET | Freq: Three times a day (TID) | ORAL | Status: DC

## 2016-11-15 MED ORDER — PANTOPRAZOLE SODIUM 40 MG PO TBEC
40.0000 mg | DELAYED_RELEASE_TABLET | Freq: Every day | ORAL | Status: DC
  Administered 2016-11-15 – 2016-11-16 (×2): 40 mg via ORAL
  Filled 2016-11-15 (×2): qty 1

## 2016-11-15 MED ORDER — ACETAMINOPHEN 325 MG PO TABS: 650.0000 mg | ORAL_TABLET | ORAL | Status: DC | PRN

## 2016-11-15 MED ORDER — MECLIZINE HCL 25 MG PO TABS: 12.5000 mg | ORAL_TABLET | Freq: Three times a day (TID) | ORAL | Status: DC | PRN

## 2016-11-15 MED ORDER — ONDANSETRON HCL 4 MG/2ML IJ SOLN
4.0000 mg | Freq: Once | INTRAMUSCULAR | Status: AC
  Administered 2016-11-15: 4 mg via INTRAVENOUS
  Filled 2016-11-15: qty 2

## 2016-11-15 MED ORDER — CALCIUM CARBONATE 1250 (500 CA) MG PO TABS
1.0000 | ORAL_TABLET | Freq: Three times a day (TID) | ORAL | Status: DC
  Administered 2016-11-16: 500 mg via ORAL
  Filled 2016-11-15 (×2): qty 1

## 2016-11-15 MED ORDER — RALOXIFENE HCL 60 MG PO TABS
60.0000 mg | ORAL_TABLET | Freq: Every day | ORAL | Status: DC
  Administered 2016-11-16: 60 mg via ORAL
  Filled 2016-11-15 (×2): qty 1

## 2016-11-15 MED ORDER — VITAMIN D 1000 UNITS PO TABS
1000.0000 [IU] | ORAL_TABLET | Freq: Every day | ORAL | Status: DC
  Administered 2016-11-16: 1000 [IU] via ORAL
  Filled 2016-11-15 (×2): qty 1

## 2016-11-15 MED ORDER — METOPROLOL TARTRATE 25 MG PO TABS
25.0000 mg | ORAL_TABLET | Freq: Two times a day (BID) | ORAL | Status: DC
  Administered 2016-11-15 – 2016-11-16 (×3): 25 mg via ORAL
  Filled 2016-11-15 (×3): qty 1

## 2016-11-15 NOTE — H&P (Signed)
History and Physical    KHALEYA AWADALLA [id number] DOB: 1922-01-13 DOA: 11/15/2016   PCP: [name], MD   Patient coming from:  Home   Chief Complaint: Chest pain   HPI: Teresa Gonzalez is a 83 y.o. female with medical history significant for PAF on any queries, hypothyroidism, you dating cancer status post radiation, small bowel obstruction status post resection with chronic diarrhea, brought to the emergency department after experiencing lower sternal chest pain around midnight, without radiation. This was associated with dyspnea, nausea, but without vomiting or diaphoresis. She had similar pain in the past, but does not recall what the diagnosis was. The patient was given aspirating and nitroglycerin in on route to the hospital, reducing the pain from 10 out of 10 to 3 out of 10. Currently she is chest pain free. Denies any dizziness or falls at this time, but she does report chronic neuropathy and occasional fall due to this, last in October 2016. No syncope or presyncope. Denies shortness of breath or cough. Denies any fever or chills. Appetite is normal. Denies any leg swelling or calf pain. Denies any headaches or vision changes. Denies any seizures No confusion reported. No recent long distance trips. Denies any new stressors. No new meds. Not on hormonal therapy.  No new herbal supplements. Never smoked. No ETOH or recreational drugs.   ED Course:  BP 118/66 (BP Location: Right Arm)   Pulse 89   Temp 97.5 F (36.4 C) (Oral)   Resp 18   Ht 5\' 4"  (1.626 m)   Wt 45.4 kg (100 lb)   SpO2 94%   BMI 17.16 kg/m    creatinine 0.81 troponin 0.01 white count 9.7 hemoglobin 13.4 platelets 298 chest x-ray without acute changes glucose 109  Sinus tachycardia Atrial premature complexes Left bundle branch block, unchanged from prior  Review of Systems: As per HPI otherwise 10 point review of systems negative.   Past Medical History:  Diagnosis Date  . Depression   . Depression   .  Hypothyroidism   . Insomnia   . Left carpal tunnel syndrome    By nerve conduction study  . Peripheral neuropathy ([place])   . S/P small bowel resection    with Diarrhea  . Status post radiation therapy   . Uterine cancer ([place])   . Vitamin D deficiency     Past Surgical History:  Procedure Laterality Date  . ABDOMINAL HYSTERECTOMY    . CATARACT EXTRACTION Bilateral     Social History Social History   Social History  . Marital status: Married    Spouse name: N/A  . Number of children: 2  . Years of education: 16   Occupational History  . Retired Marine scientist    Social History Main Topics  . Smoking status: Never Smoker  . Smokeless tobacco: Never Used  . Alcohol use No  . Drug use: No  . Sexual activity: Not on file   Other Topics Concern  . Not on file   Social History Narrative   Lives in retirement home with Husband 67 yrs old. 2 children.      Allergies  Allergen Reactions  . Codeine Itching  . Penicillins Swelling    Has patient had a PCN reaction causing immediate rash, facial/tongue/throat swelling, SOB or lightheadedness with hypotension: }yes Has patient had a PCN reaction causing severe rash involving mucus membranes or skin necrosis: no Has patient had a PCN reaction that required hospitalization no Has patient had a PCN reaction  occurring within the last 10 years: noIf all of the above answers are "NO", then may proceed with Cephalosporin    Family History  Problem Relation Age of Onset  . Stroke Mother   . Aneurysm Father   . Diabetes Brother       Prior to Admission medications   Medication Sig Start Date End Date Taking? Authorizing Provider  apixaban (ELIQUIS) 2.5 MG TABS tablet Take 1 tablet (2.5 mg total) by mouth 2 (two) times daily. 08/06/15   Cherene Altes, MD  Calcium Carbonate (CALTRATE 600 PO) Take 600 mg by mouth daily.     Historical Provider, MD  Cholecalciferol (VITAMIN D3) 1000 units CAPS Take 1,000 Units by mouth daily.     Historical Provider, MD  loperamide (IMODIUM) 2 MG capsule Take 1 capsule (2 mg total) by mouth 5 (five) times daily as needed for diarrhea or loose stools. 10/23/13   Shon Baton, MD  metoprolol tartrate (LOPRESSOR) 25 MG tablet Take 1 tablet (25 mg total) by mouth 2 (two) times daily. 08/06/15   Cherene Altes, MD  Multiple Vitamins-Minerals (MULTIVITAMIN WITH MINERALS) tablet Take 1 tablet by mouth daily.    Historical Provider, MD  NON FORMULARY Take 1 capsule by mouth daily. Juice Plus. Food Supplement.    Historical Provider, MD  omeprazole (PRILOSEC) 40 MG capsule Take 40 mg by mouth daily.    Historical Provider, MD  raloxifene (EVISTA) 60 MG tablet Take 60 mg by mouth daily.    Historical Provider, MD  SYNTHROID 50 MCG tablet Take 50 mcg by mouth daily. 07/01/15   Historical Provider, MD  VITAMIN B1-B12 IJ Inject 1 mL into the skin every 21 ( twenty-one) days.     Historical Provider, MD  Vitamin D, Ergocalciferol, (DRISDOL) 50000 UNITS CAPS Take 50,000 Units by mouth See admin instructions. Takes on Monday, Wednesday, Friday and Sunday    Historical Provider, MD    Physical Exam:  Vitals:   11/15/16 0630 11/15/16 0700 11/15/16 0730 11/15/16 0732  BP: 112/62 115/62 118/66 118/66  Pulse: 78 79 79 89  Resp: 23 20 20 18   Temp:   97.5 F (36.4 C)   TempSrc:   Oral   SpO2: 95% 97% 96% 94%  Weight:      Height:       Constitutional: NAD, calm, comfortable  Eyes: PERRL, lids and conjunctivae normal ENMT: Mucous membranes are moist, without exudate or lesions  Neck: normal, supple, no masses, no thyromegaly Respiratory: clear to auscultation bilaterally, no wheezing, no crackles. Normal respiratory effort  Cardiovascular:Irregularly irregular rate and rhythm, no murmurs / rubs / gallops. No extremity edema. 2+ pedal pulses. No carotid bruits.  Abdomen: Soft, non tender, No hepatosplenomegaly. Bowel sounds positive.  Musculoskeletal: no clubbing / cyanosis. Moves all extremities. Non  reproducible chest wall pain  Skin: no jaundice, No lesions.  Neurologic: Sensation decreased at her feet, chronic  Strength equal in all extremities. Psychiatric:   Alert and oriented x 3. Normal mood.     Labs on Admission: I have personally reviewed following labs and imaging studies  CBC:  Recent Labs Lab 11/15/16 0452  WBC 9.7  NEUTROABS 7.8*  HGB 13.4  HCT 39.1  MCV 88.9  PLT [id number]    Basic Metabolic Panel:  Recent Labs Lab 11/15/16 0452  NA 134*  K 3.4*  CL 100*  CO2 20*  GLUCOSE 109*  BUN 15  CREATININE 0.81  CALCIUM 8.5*    GFR: Estimated Creatinine Clearance:  31.8 mL/min (by C-G formula based on SCr of 0.81 mg/dL).  Liver Function Tests: No results for input(s): AST, ALT, ALKPHOS, BILITOT, PROT, ALBUMIN in the last 168 hours. No results for input(s): LIPASE, AMYLASE in the last 168 hours. No results for input(s): AMMONIA in the last 168 hours.  Coagulation Profile: No results for input(s): INR, PROTIME in the last 168 hours.  Cardiac Enzymes: No results for input(s): CKTOTAL, CKMB, CKMBINDEX, TROPONINI in the last 168 hours.  BNP (last 3 results) No results for input(s): PROBNP in the last 8760 hours.  HbA1C: No results for input(s): HGBA1C in the last 72 hours.  CBG: No results for input(s): GLUCAP in the last 168 hours.  Lipid Profile: No results for input(s): CHOL, HDL, LDLCALC, TRIG, CHOLHDL, LDLDIRECT in the last 72 hours.  Thyroid Function Tests: No results for input(s): TSH, T4TOTAL, FREET4, T3FREE, THYROIDAB in the last 72 hours.  Anemia Panel: No results for input(s): VITAMINB12, FOLATE, FERRITIN, TIBC, IRON, RETICCTPCT in the last 72 hours.  Urine analysis:    Component Value Date/Time   COLORURINE YELLOW 05/22/2016 0542   APPEARANCEUR CLEAR 05/22/2016 0542   LABSPEC 1.025 05/22/2016 0542   PHURINE 5.5 05/22/2016 0542   GLUCOSEU NEGATIVE 05/22/2016 0542   HGBUR NEGATIVE 05/22/2016 0542   BILIRUBINUR NEGATIVE 05/22/2016  0542   KETONESUR NEGATIVE 05/22/2016 0542   PROTEINUR NEGATIVE 05/22/2016 0542   UROBILINOGEN 0.2 08/01/2015 0125   NITRITE NEGATIVE 05/22/2016 0542   LEUKOCYTESUR TRACE (A) 05/22/2016 0542    Sepsis Labs: @LABRCNTIP (procalcitonin:4,lacticidven:4) )No results found for this or any previous visit (from the past 240 hour(s)).   Radiological Exams on Admission: Dg Chest Port 1 View  Result Date: 11/15/2016 CLINICAL DATA:  Centralized chest pain, shortness of breath tonight. EXAM: PORTABLE CHEST 1 VIEW COMPARISON:  Chest radiograph May 22, 2016 FINDINGS: Cardiomediastinal silhouette is normal. Calcific atherosclerosis of the aortic arch. No pleural effusions or focal consolidations. Mildly elevated RIGHT hemidiaphragm. Tiny RIGHT apical granuloma. Trachea projects midline and there is no pneumothorax. Soft tissue planes and included osseous structures are non-suspicious. Mild broad levoscoliosis may be positional. IMPRESSION: No acute cardiopulmonary process. Electronically Signed   By: Elon Alas M.D.   On: 11/15/2016 04:45    EKG: Independently reviewed.  Assessment/Plan Active Problems:   Abnormality of gait   Disturbance of skin sensation   Polyneuropathy in other diseases classified elsewhere [facility])   SBO (small bowel obstruction)   Atrial fibrillation ([facility])   Osteoporosis   Chest pain    Chest pain syndrome  EMS called given ASA 324mg  and  SL NTG  X 2 with reports of pain down to 3/10. VS currently stable. Lab work was relatively unremarkable. CXR no acute process. EKG showing LBBB unchanged. Given morphine in ED currently reported chest pain free. Telemetry observation for cycling of troponins. HEART score3-4.   Admit to Telemetry/ Observation Chest pain order set Cycle troponins EKG in am continue ASA, O2 and NTG as needed Continue preadmission beta blocker  GI cocktail and    Atrial Fibrillation CHA2DS2-VASc score 4, on anticoagulation with Eliquis. HAS BLED 3   Last 2 D echo in 07/2015 showed  Normal systolic function.  EF 50% to 55%.   Rate controlled -Continue meds   Hypothyroidism: -Continue home Synthroid   History of uterine Ca s/p radiation No acute issues at this time  Osteoporosis Continue Evista and D3, Caltrate   History of SBO s/p colon resection, chronic diarrhea No acute issues at the present  Continue Bentyl and Imodium   Peripheral neuropathy, chronic. Exam shows decreased sensation in her feet that can cause falls.  Start Neurontin 100 mg tid and can follow up as outpatient    DVT prophylaxis: Eliquis  Code Status:    DNR Family Communication:  Discussed with patient Disposition Plan: Expect patient to be discharged to home after condition improves Consults called:    None Admission status:Tele  Obs     Raimundo Corbit E, PA-C [facility]   11/15/2016, 8:02 AM

## 2016-11-15 NOTE — Progress Notes (Addendum)
Received from Dr. Roxanne Mins Teresa Gonzalez is a 83 year old female pmh PAF on Eliquis, hypothyroidism, uterine ca s/p radiation, SBO s/p resection; who presents with c/o centralized CP. Pain 10/10. EMS called given asa 324mg  and sl ntg X 2 with reports of pain down to 3/10.   VS currently stable. Lab work was relatively unremarkable.CXR no acute process. EKG showing LBBB unchanged. Given morphine ain ED currently reported chest pain free. Telemetry observation for cycling of troponins.

## 2016-11-15 NOTE — Progress Notes (Signed)
Patient admitted to [facility], no complaints of chest pain, VSS.  Patient states she is supposed to be discharged today, confirmed with Sharene Butters that patient will stay overnight.  Explained this to patient and her need to have further troponins and patient was understanding and agreeable with this.

## 2016-11-15 NOTE — ED Triage Notes (Signed)
Brought by ems from home for c/o central chest pressure rating 10/10 on their arrival.  After taking asa 324mg  and sl ntg X 2 down to 3/10.

## 2016-11-15 NOTE — ED Notes (Signed)
Pt did not need anything at this time  

## 2016-11-15 NOTE — ED Provider Notes (Signed)
[facility] DEPT Provider Note   CSN: [id number] Arrival date & time: 11/15/16  0419     History   Chief Complaint No chief complaint on file.   HPI Teresa Gonzalez is a 83 y.o. female.  She had onset about midnight of a lower sternal chest pain. She describes it as a sharp pain and also a pressure feeling. There is no radiation of pain. There is associated dyspnea nausea but no vomiting or diaphoresis. She has had similar pain in the past but does not recall what the diagnosis was. She came in by ambulance who gave her aspirin and nitroglycerin which to give partial relief of pain. EMS reported pain went from 10/10-3/10. Patient is having difficulty putting a number on her pain currently. She is a nonsmoker and does not have history of hypertension diabetes or hyperlipidemia. She does have history of paroxysmal atrial fibrillation and is anticoagulated on apixaban.   The history is provided by the patient.    Past Medical History:  Diagnosis Date  . Depression   . Depression   . Hypothyroidism   . Insomnia   . Left carpal tunnel syndrome    By nerve conduction study  . Peripheral neuropathy ([place])   . S/P small bowel resection    with Diarrhea  . Status post radiation therapy   . Uterine cancer ([place])   . Vitamin D deficiency     Patient Active Problem List   Diagnosis Date Noted  . Atrial fibrillation ([place])   . Osteoporosis   . SBO (small bowel obstruction) 10/20/2013  . Abnormality of gait 01/11/2012  . Disturbance of skin sensation 01/11/2012  . Degeneration of lumbar or lumbosacral intervertebral disc 01/11/2012  . Lumbosacral root lesions, not elsewhere classified 01/11/2012  . Polyneuropathy in other diseases classified elsewhere ([place]) 01/11/2012    Past Surgical History:  Procedure Laterality Date  . ABDOMINAL HYSTERECTOMY    . CATARACT EXTRACTION Bilateral     OB History    No data available       Home Medications    Prior to Admission  medications   Medication Sig Start Date End Date Taking? Authorizing Provider  apixaban (ELIQUIS) 2.5 MG TABS tablet Take 1 tablet (2.5 mg total) by mouth 2 (two) times daily. 08/06/15   Cherene Altes, MD  Calcium Carbonate (CALTRATE 600 PO) Take 600 mg by mouth daily.     Historical Provider, MD  Cholecalciferol (VITAMIN D3) 1000 units CAPS Take 1,000 Units by mouth daily.    Historical Provider, MD  dicyclomine (BENTYL) 20 MG tablet Take 1 tablet (20 mg total) by mouth 2 (two) times daily. Patient not taking: Reported on 05/22/2016 11/12/14   Orpah Greek, MD  loperamide (IMODIUM) 2 MG capsule Take 1 capsule (2 mg total) by mouth 5 (five) times daily as needed for diarrhea or loose stools. 10/23/13   Shon Baton, MD  metoprolol tartrate (LOPRESSOR) 25 MG tablet Take 1 tablet (25 mg total) by mouth 2 (two) times daily. 08/06/15   Cherene Altes, MD  Multiple Vitamins-Minerals (MULTIVITAMIN WITH MINERALS) tablet Take 1 tablet by mouth daily.    Historical Provider, MD  NON FORMULARY Take 1 capsule by mouth daily. Juice Plus. Food Supplement.    Historical Provider, MD  omeprazole (PRILOSEC) 40 MG capsule Take 40 mg by mouth daily.    Historical Provider, MD  ondansetron (ZOFRAN) 4 MG tablet Take 1 tablet (4 mg total) by mouth every 6 (six) hours. Patient not  taking: Reported on 05/22/2016 11/12/14   Orpah Greek, MD  raloxifene (EVISTA) 60 MG tablet Take 60 mg by mouth daily.    Historical Provider, MD  SYNTHROID 50 MCG tablet Take 50 mcg by mouth daily. 07/01/15   Historical Provider, MD  VITAMIN B1-B12 IJ Inject 1 mL into the skin every 21 ( twenty-one) days.     Historical Provider, MD  Vitamin D, Ergocalciferol, (DRISDOL) 50000 UNITS CAPS Take 50,000 Units by mouth See admin instructions. Takes on Monday, Wednesday, Friday and Sunday    Historical Provider, MD    Family History Family History  Problem Relation Age of Onset  . Stroke Mother   . Aneurysm Father   . Diabetes  Brother     Social History Social History  Substance Use Topics  . Smoking status: Never Smoker  . Smokeless tobacco: Never Used  . Alcohol use No     Allergies   Codeine and Penicillins   Review of Systems Review of Systems  All other systems reviewed and are negative.    Physical Exam Updated Vital Signs BP 126/66   Pulse 96   Temp 97.7 F (36.5 C) (Oral)   Resp 20   Ht 5\' 4"  (1.626 m)   Wt 100 lb (45.4 kg)   SpO2 95%   BMI 17.16 kg/m   Physical Exam  Nursing note and vitals reviewed.  83 year old female, resting comfortably and in no acute distress. Vital signs are normal. Oxygen saturation is 95%, which is normal. Head is normocephalic and atraumatic. PERRLA, EOMI. Oropharynx is clear. Neck is nontender and supple without adenopathy or JVD. Back is nontender and there is no CVA tenderness. Lungs are clear without rales, wheezes, or rhonchi. Chest is mildly tender in the left parasternal area. Heart has regular rate and rhythm without murmur. Abdomen is soft, flat, nontender without masses or hepatosplenomegaly and peristalsis is normoactive. Extremities have no cyanosis or edema, full range of motion is present. Skin is warm and dry without rash. Neurologic: Mental status is normal, cranial nerves are intact, there are no motor or sensory deficits.  ED Treatments / Results  Labs (all labs ordered are listed, but only abnormal results are displayed) Labs Reviewed  BASIC METABOLIC PANEL - Abnormal; Notable for the following:       Result Value   Sodium 134 (*)    Potassium 3.4 (*)    Chloride 100 (*)    CO2 20 (*)    Glucose, Bld 109 (*)    Calcium 8.5 (*)    All other components within normal limits  CBC WITH DIFFERENTIAL/PLATELET - Abnormal; Notable for the following:    Neutro Abs 7.8 (*)    All other components within normal limits  I-STAT TROPOININ, ED    EKG  EKG Interpretation  Date/Time:  Monday November 15 2016 04:30:56  EST Ventricular Rate:  98 PR Interval:    QRS Duration: 138 QT Interval:  384 QTC Calculation: 491 R Axis:   -71 Text Interpretation:  Sinus tachycardia Atrial premature complexes Left bundle branch block Baseline wander in lead(s) I V3 V4 V6 When compared with ECG of 05/25/2016, Premature atrial complexes are now present Confirmed by [facility]  MD, Devi Hopman ([id number]) on 11/15/2016 4:34:32 AM       Radiology Dg Chest Port 1 View  Result Date: 11/15/2016 CLINICAL DATA:  Centralized chest pain, shortness of breath tonight. EXAM: PORTABLE CHEST 1 VIEW COMPARISON:  Chest radiograph May 22, 2016 FINDINGS: Cardiomediastinal  silhouette is normal. Calcific atherosclerosis of the aortic arch. No pleural effusions or focal consolidations. Mildly elevated RIGHT hemidiaphragm. Tiny RIGHT apical granuloma. Trachea projects midline and there is no pneumothorax. Soft tissue planes and included osseous structures are non-suspicious. Mild broad levoscoliosis may be positional. IMPRESSION: No acute cardiopulmonary process. Electronically Signed   By: Elon Alas M.D.   On: 11/15/2016 04:45    Procedures Procedures (including critical care time)  Medications Ordered in ED Medications  morphine 4 MG/ML injection 4 mg (4 mg Intravenous Given 11/15/16 0451)  ondansetron (ZOFRAN) injection 4 mg (4 mg Intravenous Given 11/15/16 0451)     Initial Impression / Assessment and Plan / ED Course  I have reviewed the triage vital signs and the nursing notes.  Pertinent labs & imaging results that were available during my care of the patient were reviewed by me and considered in my medical decision making (see chart for details).  Chest pain of uncertain cause. She is anticoagulated, so pulmonary embolism is not likely. No definite history of coronary artery disease and no risk factors other than age. ECG is unchanged, but left bundle branch block cannot significant cardiac issues. Screening labs are obtained including  troponin. She'll be given morphine for pain. Old records are reviewed, and I see no relevant past visits.  She had complete relief of pain with morphine. I am uncomfortable with her history so feel that she should be admitted for serial troponins. Case is discussed with Dr. Tamala Julian of [facility] who agrees to admit the patient under observation status.  Final Clinical Impressions(s) / ED Diagnoses   Final diagnoses:  Chest pain, unspecified type    New Prescriptions New Prescriptions   No medications on file     Delora Fuel, MD [id number]

## 2016-11-15 NOTE — Progress Notes (Signed)
Patient continues with no c/o chest pain or shortness of breath during this shift.  Has had multiple large loose stools since being admitted to unit, states she has had chronic diarrhea for years and has to take Imodium 5-6 times a day to control this.  Patient did have 2 Imodium during this shift.  It is noted in Md's note that patient has chronic diarrhea and order for Imodium was already in when patient was admitted to unit.

## 2016-11-16 DIAGNOSIS — Z8542 Personal history of malignant neoplasm of other parts of uterus: Secondary | ICD-10-CM | POA: Diagnosis not present

## 2016-11-16 DIAGNOSIS — F329 Major depressive disorder, single episode, unspecified: Secondary | ICD-10-CM | POA: Diagnosis not present

## 2016-11-16 DIAGNOSIS — Z923 Personal history of irradiation: Secondary | ICD-10-CM | POA: Diagnosis not present

## 2016-11-16 DIAGNOSIS — R269 Unspecified abnormalities of gait and mobility: Secondary | ICD-10-CM

## 2016-11-16 DIAGNOSIS — G4700 Insomnia, unspecified: Secondary | ICD-10-CM | POA: Diagnosis not present

## 2016-11-16 DIAGNOSIS — G629 Polyneuropathy, unspecified: Secondary | ICD-10-CM | POA: Diagnosis not present

## 2016-11-16 DIAGNOSIS — R0789 Other chest pain: Secondary | ICD-10-CM | POA: Diagnosis not present

## 2016-11-16 DIAGNOSIS — Z880 Allergy status to penicillin: Secondary | ICD-10-CM | POA: Diagnosis not present

## 2016-11-16 DIAGNOSIS — I480 Paroxysmal atrial fibrillation: Secondary | ICD-10-CM | POA: Diagnosis not present

## 2016-11-16 DIAGNOSIS — E039 Hypothyroidism, unspecified: Secondary | ICD-10-CM | POA: Diagnosis not present

## 2016-11-16 NOTE — Discharge Instructions (Signed)
Acute Pain, Adult °Acute pain is a type of pain that may last for just a few days or as long as six months. It is often related to an illness, injury, or medical procedure. Acute pain may be mild, moderate, or severe. It usually goes away once your injury has healed or you are no longer ill. °Pain can make it hard for you to do daily activities. It can cause anxiety and lead to other problems if left untreated. Treatment depends on the cause and severity of your acute pain. °Follow these instructions at home: °· Check your pain level as told by your health care provider. °· Take over-the-counter and prescription medicines only as told by your health care provider. °· If you are taking prescription pain medicine: °¨ Ask your health care provider about taking a stool softener or laxative to prevent constipation. °¨ Do not stop taking the medicine suddenly. Talk to your health care provider about how and when to discontinue prescription pain medicine. °¨ If your pain is severe, do not take more pills than instructed by your health care provider. °¨ Do not take other over-the-counter pain medicines in addition to this medicine unless told by your health care provider. °¨ Do not drive or operate heavy machinery while taking prescription pain medicine. °· Apply ice or heat as told by your health care provider. These may reduce swelling and pain. °· Ask your health care provider if other strategies such as distraction, relaxation, or physical therapies can help your pain. °· Keep all follow-up visits as told by your health care provider. This is important. °Contact a health care provider if: °· You have pain that is not controlled by medicine. °· Your pain does not improve or gets worse. °· You have side effects from pain medicines, such as vomiting or confusion. °Get help right away if: °· You have severe pain. °· You have trouble breathing. °· You lose consciousness. °· You have chest pain or pressure that lasts for more  than a few minutes. Along with the chest pain you may: °¨ Have pain or discomfort in one or both arms, your back, neck, jaw, or stomach. °¨ Have shortness of breath. °¨ Break out in a cold sweat. °¨ Feel nauseous. °¨ Become light-headed. °These symptoms may represent a serious problem that is an emergency. Do not wait to see if the symptoms will go away. Get medical help right away. Call your local emergency services (911 in the [place] Do not drive yourself to the hospital.  °This information is not intended to replace advice given to you by your health care provider. Make sure you discuss any questions you have with your health care provider. °Document Released: 10/12/2015 Document Revised: 03/05/2016 Document Reviewed: 10/12/2015 °Elsevier Interactive Patient Education © 2017 [employer] ° °

## 2016-11-16 NOTE — Progress Notes (Signed)
Patient had several loose BMs during the night, she stated she has chronic diarrhea, Immodium given, tolerated well.

## 2016-11-16 NOTE — Discharge Summary (Signed)
Physician Discharge Summary  Teresa Gonzalez [id number] DOB: 18-Oct-1921 DOA: 11/15/2016  PCP: [name], MD  Admit date: 11/15/2016 Discharge date: 11/16/2016  Recommendations for Outpatient Follow-up:  1. Pt will need to follow up with PCP in 2-3 weeks post discharge 2. Please obtain BMP to evaluate electrolytes and kidney function  Discharge Diagnoses:  Active Problems:   Abnormality of gait   Disturbance of skin sensation   Polyneuropathy in other diseases classified elsewhere ([facility])   SBO (small bowel obstruction)   Atrial fibrillation ([facility])   Osteoporosis   Chest pain  Discharge Condition: Stable  Diet recommendation: Heart healthy diet discussed in details   History of present illness:  83 y.o. female with medical history significant for PAF, hypothyroidism, small bowel obstruction status post resection with chronic diarrhea, brought to the emergency department after experiencing lower sternal chest pain around midnight, without radiation. This was associated with dyspnea, nausea, but without vomiting or diaphoresis.   Hospital Course:  Active Problems:   Chest pain  - resolved this am, etiology remains unclear - no events overnight - CE's so far negative - pt very clear that she wants to gp home, conservative monitoring only - she is not interested in any interventions, no stress tests     Atrial fibrillation ([facility]) - rate controlled - continue Eliquis     Hypokalemia - supplemented prior to discharge   Procedures/Studies: Dg Chest Port 1 View  Result Date: 11/15/2016 CLINICAL DATA:  Centralized chest pain, shortness of breath tonight. EXAM: PORTABLE CHEST 1 VIEW COMPARISON:  Chest radiograph May 22, 2016 FINDINGS: Cardiomediastinal silhouette is normal. Calcific atherosclerosis of the aortic arch. No pleural effusions or focal consolidations. Mildly elevated RIGHT hemidiaphragm. Tiny RIGHT apical granuloma. Trachea projects midline and there is no  pneumothorax. Soft tissue planes and included osseous structures are non-suspicious. Mild broad levoscoliosis may be positional. IMPRESSION: No acute cardiopulmonary process. Electronically Signed   By: Elon Alas M.D.   On: 11/15/2016 04:45     Discharge Exam: Vitals:   11/16/16 0934 11/16/16 0945  BP: 137/79 (!) 141/59  Pulse: 97 83  Resp: 18   Temp: 98 F (36.7 C)    Vitals:   11/16/16 0005 11/16/16 0439 11/16/16 0934 11/16/16 0945  BP: 123/65 (!) 148/58 137/79 (!) 141/59  Pulse: 73 80 97 83  Resp: 16 18 18    Temp: 97.9 F (36.6 C) 98.1 F (36.7 C) 98 F (36.7 C)   TempSrc: Oral Oral Oral   SpO2: 99% 97% 97%   Weight:  47.4 kg (104 lb 8 oz)    Height:        General: Pt is alert, follows commands appropriately, not in acute distress Cardiovascular: Irregular rate and rhythm, no rubs, no gallops Respiratory: Clear to auscultation bilaterally, no wheezing, no crackles, no rhonchi Abdominal: Soft, non tender, non distended, bowel sounds +, no guarding   Discharge Instructions  Discharge Instructions    Diet - low sodium heart healthy    Complete by:  As directed    Increase activity slowly    Complete by:  As directed      Allergies as of 11/16/2016      Reactions   Codeine Itching   Penicillins Swelling      Medication List    STOP taking these medications   dicyclomine 20 MG tablet Commonly known as:  BENTYL   ondansetron 4 MG tablet Commonly known as:  ZOFRAN     TAKE these medications  apixaban 2.5 MG Tabs tablet Commonly known as:  ELIQUIS Take 1 tablet (2.5 mg total) by mouth 2 (two) times daily.   CALTRATE 600 PO Take 600 mg by mouth 3 (three) times daily.   loperamide 2 MG capsule Commonly known as:  IMODIUM Take 1 capsule (2 mg total) by mouth 5 (five) times daily as needed for diarrhea or loose stools.   meclizine 12.5 MG tablet Commonly known as:  ANTIVERT Take 12.5 mg by mouth 3 (three) times daily as needed for dizziness.    methocarbamol 500 MG tablet Commonly known as:  ROBAXIN Take 500 mg by mouth every 8 (eight) hours as needed for muscle spasms.   metoprolol tartrate 25 MG tablet Commonly known as:  LOPRESSOR Take 1 tablet (25 mg total) by mouth 2 (two) times daily.   multivitamin with minerals tablet Take 1 tablet by mouth daily.   NON FORMULARY Take 1 capsule by mouth daily. Juice Plus. Food Supplement.   omeprazole 40 MG capsule Commonly known as:  PRILOSEC Take 40 mg by mouth daily.   raloxifene 60 MG tablet Commonly known as:  EVISTA Take 60 mg by mouth daily.   SYNTHROID 50 MCG tablet Generic drug:  levothyroxine Take 50 mcg by mouth daily.   VITAMIN B1-B12 IJ Inject 1 mL into the skin every 21 ( twenty-one) days.   Vitamin D (Ergocalciferol) 50000 units Caps capsule Commonly known as:  DRISDOL Take 50,000 Units by mouth See admin instructions. Takes on Monday, Wednesday, Friday and Sunday   Vitamin D3 1000 units Caps Take 1,000 Units by mouth daily.      Follow-up Information    [name], MD Follow up.   Specialty:  Internal Medicine Contact information: [address] [place] [address] [phone]            The results of significant diagnostics from this hospitalization (including imaging, microbiology, ancillary and laboratory) are listed below for reference.     Microbiology: No results found for this or any previous visit (from the past 240 hour(s)).   Labs: Basic Metabolic Panel:  Recent Labs Lab 11/15/16 0452  NA 134*  K 3.4*  CL 100*  CO2 20*  GLUCOSE 109*  BUN 15  CREATININE 0.81  CALCIUM 8.5*   CBC:  Recent Labs Lab 11/15/16 0452  WBC 9.7  NEUTROABS 7.8*  HGB 13.4  HCT 39.1  MCV 88.9  PLT 298   Cardiac Enzymes:  Recent Labs Lab 11/15/16 0741 11/15/16 1120 11/15/16 1335  TROPONINI <0.03 <0.03 <0.03   SIGNED: Time coordinating discharge:  30 minutes  Faye Ramsay, MD  [facility] 11/16/2016,  12:52 PM Pager [phone]  If 7PM-7AM, please contact night-coverage [email or web address] Password [email or web address]

## 2016-11-16 NOTE — Progress Notes (Signed)
Pt is alert and oriented, Walked with NT in the hallway with no chest pain or distress, Vital stable and at baseline discharged today.

## 2016-11-26 DIAGNOSIS — I130 Hypertensive heart and chronic kidney disease with heart failure and stage 1 through stage 4 chronic kidney disease, or unspecified chronic kidney disease: Secondary | ICD-10-CM | POA: Diagnosis not present

## 2016-12-13 DIAGNOSIS — I130 Hypertensive heart and chronic kidney disease with heart failure and stage 1 through stage 4 chronic kidney disease, or unspecified chronic kidney disease: Secondary | ICD-10-CM | POA: Diagnosis not present

## 2016-12-13 DIAGNOSIS — Z681 Body mass index (BMI) 19 or less, adult: Secondary | ICD-10-CM | POA: Diagnosis not present

## 2016-12-13 DIAGNOSIS — R0789 Other chest pain: Secondary | ICD-10-CM | POA: Diagnosis not present

## 2016-12-13 DIAGNOSIS — Z7901 Long term (current) use of anticoagulants: Secondary | ICD-10-CM | POA: Diagnosis not present

## 2016-12-23 DIAGNOSIS — Z681 Body mass index (BMI) 19 or less, adult: Secondary | ICD-10-CM | POA: Diagnosis not present

## 2016-12-23 DIAGNOSIS — I130 Hypertensive heart and chronic kidney disease with heart failure and stage 1 through stage 4 chronic kidney disease, or unspecified chronic kidney disease: Secondary | ICD-10-CM | POA: Diagnosis not present

## 2016-12-23 DIAGNOSIS — Z7901 Long term (current) use of anticoagulants: Secondary | ICD-10-CM | POA: Diagnosis not present

## 2016-12-23 DIAGNOSIS — R0789 Other chest pain: Secondary | ICD-10-CM | POA: Diagnosis not present

## 2016-12-30 DIAGNOSIS — R2232 Localized swelling, mass and lump, left upper limb: Secondary | ICD-10-CM | POA: Diagnosis not present

## 2016-12-30 DIAGNOSIS — Z6820 Body mass index (BMI) 20.0-20.9, adult: Secondary | ICD-10-CM | POA: Diagnosis not present

## 2016-12-30 DIAGNOSIS — I480 Paroxysmal atrial fibrillation: Secondary | ICD-10-CM | POA: Diagnosis not present

## 2016-12-30 DIAGNOSIS — Z7901 Long term (current) use of anticoagulants: Secondary | ICD-10-CM | POA: Diagnosis not present

## 2017-01-03 DIAGNOSIS — H524 Presbyopia: Secondary | ICD-10-CM | POA: Diagnosis not present

## 2017-01-21 DIAGNOSIS — N390 Urinary tract infection, site not specified: Secondary | ICD-10-CM | POA: Diagnosis not present

## 2017-01-21 DIAGNOSIS — R42 Dizziness and giddiness: Secondary | ICD-10-CM | POA: Diagnosis not present

## 2017-01-21 DIAGNOSIS — R2232 Localized swelling, mass and lump, left upper limb: Secondary | ICD-10-CM | POA: Diagnosis not present

## 2017-01-21 DIAGNOSIS — E46 Unspecified protein-calorie malnutrition: Secondary | ICD-10-CM | POA: Diagnosis not present

## 2017-01-21 DIAGNOSIS — Z681 Body mass index (BMI) 19 or less, adult: Secondary | ICD-10-CM | POA: Diagnosis not present

## 2017-01-21 DIAGNOSIS — R627 Adult failure to thrive: Secondary | ICD-10-CM | POA: Diagnosis not present

## 2017-01-21 DIAGNOSIS — R8299 Other abnormal findings in urine: Secondary | ICD-10-CM | POA: Diagnosis not present

## 2017-01-21 DIAGNOSIS — I130 Hypertensive heart and chronic kidney disease with heart failure and stage 1 through stage 4 chronic kidney disease, or unspecified chronic kidney disease: Secondary | ICD-10-CM | POA: Diagnosis not present

## 2017-01-28 DIAGNOSIS — I480 Paroxysmal atrial fibrillation: Secondary | ICD-10-CM | POA: Diagnosis not present

## 2017-01-28 DIAGNOSIS — M542 Cervicalgia: Secondary | ICD-10-CM | POA: Diagnosis not present

## 2017-01-28 DIAGNOSIS — R42 Dizziness and giddiness: Secondary | ICD-10-CM | POA: Diagnosis not present

## 2017-02-07 DIAGNOSIS — Z7189 Other specified counseling: Secondary | ICD-10-CM | POA: Diagnosis not present

## 2017-02-07 DIAGNOSIS — M5440 Lumbago with sciatica, unspecified side: Secondary | ICD-10-CM | POA: Diagnosis not present

## 2017-02-07 DIAGNOSIS — Z681 Body mass index (BMI) 19 or less, adult: Secondary | ICD-10-CM | POA: Diagnosis not present

## 2017-02-07 DIAGNOSIS — F419 Anxiety disorder, unspecified: Secondary | ICD-10-CM | POA: Diagnosis not present

## 2017-02-09 DIAGNOSIS — M5440 Lumbago with sciatica, unspecified side: Secondary | ICD-10-CM | POA: Diagnosis not present

## 2017-02-09 DIAGNOSIS — I10 Essential (primary) hypertension: Secondary | ICD-10-CM | POA: Diagnosis not present

## 2017-02-09 DIAGNOSIS — D510 Vitamin B12 deficiency anemia due to intrinsic factor deficiency: Secondary | ICD-10-CM | POA: Diagnosis not present

## 2017-02-09 DIAGNOSIS — I447 Left bundle-branch block, unspecified: Secondary | ICD-10-CM | POA: Diagnosis not present

## 2017-02-09 DIAGNOSIS — G629 Polyneuropathy, unspecified: Secondary | ICD-10-CM | POA: Diagnosis not present

## 2017-02-09 DIAGNOSIS — M810 Age-related osteoporosis without current pathological fracture: Secondary | ICD-10-CM | POA: Diagnosis not present

## 2017-02-09 DIAGNOSIS — E559 Vitamin D deficiency, unspecified: Secondary | ICD-10-CM | POA: Diagnosis not present

## 2017-02-09 DIAGNOSIS — I4891 Unspecified atrial fibrillation: Secondary | ICD-10-CM | POA: Diagnosis not present

## 2017-02-09 DIAGNOSIS — E782 Mixed hyperlipidemia: Secondary | ICD-10-CM | POA: Diagnosis not present

## 2017-02-11 DIAGNOSIS — M810 Age-related osteoporosis without current pathological fracture: Secondary | ICD-10-CM | POA: Diagnosis not present

## 2017-02-11 DIAGNOSIS — G629 Polyneuropathy, unspecified: Secondary | ICD-10-CM | POA: Diagnosis not present

## 2017-02-11 DIAGNOSIS — E559 Vitamin D deficiency, unspecified: Secondary | ICD-10-CM | POA: Diagnosis not present

## 2017-02-11 DIAGNOSIS — I4891 Unspecified atrial fibrillation: Secondary | ICD-10-CM | POA: Diagnosis not present

## 2017-02-11 DIAGNOSIS — I447 Left bundle-branch block, unspecified: Secondary | ICD-10-CM | POA: Diagnosis not present

## 2017-02-11 DIAGNOSIS — D510 Vitamin B12 deficiency anemia due to intrinsic factor deficiency: Secondary | ICD-10-CM | POA: Diagnosis not present

## 2017-02-11 DIAGNOSIS — E782 Mixed hyperlipidemia: Secondary | ICD-10-CM | POA: Diagnosis not present

## 2017-02-11 DIAGNOSIS — I10 Essential (primary) hypertension: Secondary | ICD-10-CM | POA: Diagnosis not present

## 2017-02-11 DIAGNOSIS — M5440 Lumbago with sciatica, unspecified side: Secondary | ICD-10-CM | POA: Diagnosis not present

## 2017-02-14 DIAGNOSIS — I4891 Unspecified atrial fibrillation: Secondary | ICD-10-CM | POA: Diagnosis not present

## 2017-02-14 DIAGNOSIS — G629 Polyneuropathy, unspecified: Secondary | ICD-10-CM | POA: Diagnosis not present

## 2017-02-14 DIAGNOSIS — D510 Vitamin B12 deficiency anemia due to intrinsic factor deficiency: Secondary | ICD-10-CM | POA: Diagnosis not present

## 2017-02-14 DIAGNOSIS — M5440 Lumbago with sciatica, unspecified side: Secondary | ICD-10-CM | POA: Diagnosis not present

## 2017-02-14 DIAGNOSIS — I447 Left bundle-branch block, unspecified: Secondary | ICD-10-CM | POA: Diagnosis not present

## 2017-02-14 DIAGNOSIS — E559 Vitamin D deficiency, unspecified: Secondary | ICD-10-CM | POA: Diagnosis not present

## 2017-02-14 DIAGNOSIS — I10 Essential (primary) hypertension: Secondary | ICD-10-CM | POA: Diagnosis not present

## 2017-02-14 DIAGNOSIS — M810 Age-related osteoporosis without current pathological fracture: Secondary | ICD-10-CM | POA: Diagnosis not present

## 2017-02-14 DIAGNOSIS — E782 Mixed hyperlipidemia: Secondary | ICD-10-CM | POA: Diagnosis not present

## 2017-02-15 DIAGNOSIS — E559 Vitamin D deficiency, unspecified: Secondary | ICD-10-CM | POA: Diagnosis not present

## 2017-02-15 DIAGNOSIS — M810 Age-related osteoporosis without current pathological fracture: Secondary | ICD-10-CM | POA: Diagnosis not present

## 2017-02-15 DIAGNOSIS — M5440 Lumbago with sciatica, unspecified side: Secondary | ICD-10-CM | POA: Diagnosis not present

## 2017-02-15 DIAGNOSIS — D510 Vitamin B12 deficiency anemia due to intrinsic factor deficiency: Secondary | ICD-10-CM | POA: Diagnosis not present

## 2017-02-15 DIAGNOSIS — E782 Mixed hyperlipidemia: Secondary | ICD-10-CM | POA: Diagnosis not present

## 2017-02-15 DIAGNOSIS — I10 Essential (primary) hypertension: Secondary | ICD-10-CM | POA: Diagnosis not present

## 2017-02-15 DIAGNOSIS — I447 Left bundle-branch block, unspecified: Secondary | ICD-10-CM | POA: Diagnosis not present

## 2017-02-15 DIAGNOSIS — G629 Polyneuropathy, unspecified: Secondary | ICD-10-CM | POA: Diagnosis not present

## 2017-02-15 DIAGNOSIS — I4891 Unspecified atrial fibrillation: Secondary | ICD-10-CM | POA: Diagnosis not present

## 2017-02-17 DIAGNOSIS — I10 Essential (primary) hypertension: Secondary | ICD-10-CM | POA: Diagnosis not present

## 2017-02-17 DIAGNOSIS — E782 Mixed hyperlipidemia: Secondary | ICD-10-CM | POA: Diagnosis not present

## 2017-02-17 DIAGNOSIS — I447 Left bundle-branch block, unspecified: Secondary | ICD-10-CM | POA: Diagnosis not present

## 2017-02-17 DIAGNOSIS — M5440 Lumbago with sciatica, unspecified side: Secondary | ICD-10-CM | POA: Diagnosis not present

## 2017-02-17 DIAGNOSIS — I4891 Unspecified atrial fibrillation: Secondary | ICD-10-CM | POA: Diagnosis not present

## 2017-02-17 DIAGNOSIS — G629 Polyneuropathy, unspecified: Secondary | ICD-10-CM | POA: Diagnosis not present

## 2017-02-17 DIAGNOSIS — D510 Vitamin B12 deficiency anemia due to intrinsic factor deficiency: Secondary | ICD-10-CM | POA: Diagnosis not present

## 2017-02-17 DIAGNOSIS — E559 Vitamin D deficiency, unspecified: Secondary | ICD-10-CM | POA: Diagnosis not present

## 2017-02-17 DIAGNOSIS — M810 Age-related osteoporosis without current pathological fracture: Secondary | ICD-10-CM | POA: Diagnosis not present

## 2017-02-18 ENCOUNTER — Emergency Department (HOSPITAL_COMMUNITY): Payer: Medicare Other

## 2017-02-18 ENCOUNTER — Inpatient Hospital Stay (HOSPITAL_COMMUNITY)
Admission: EM | Admit: 2017-02-18 | Discharge: 2017-02-20 | DRG: 690 | Disposition: A | Discharge status: Home Health | Payer: Medicare Other | Attending: Nephrology | Admitting: Nephrology

## 2017-02-18 ENCOUNTER — Encounter (HOSPITAL_COMMUNITY): Payer: Self-pay | Admitting: Emergency Medicine

## 2017-02-18 DIAGNOSIS — E890 Postprocedural hypothyroidism: Secondary | ICD-10-CM | POA: Diagnosis not present

## 2017-02-18 DIAGNOSIS — R531 Weakness: Secondary | ICD-10-CM

## 2017-02-18 DIAGNOSIS — B9620 Unspecified Escherichia coli [E. coli] as the cause of diseases classified elsewhere: Secondary | ICD-10-CM | POA: Diagnosis not present

## 2017-02-18 DIAGNOSIS — E162 Hypoglycemia, unspecified: Secondary | ICD-10-CM | POA: Diagnosis present

## 2017-02-18 DIAGNOSIS — K529 Noninfective gastroenteritis and colitis, unspecified: Secondary | ICD-10-CM | POA: Diagnosis present

## 2017-02-18 DIAGNOSIS — Z880 Allergy status to penicillin: Secondary | ICD-10-CM | POA: Diagnosis not present

## 2017-02-18 DIAGNOSIS — I4891 Unspecified atrial fibrillation: Secondary | ICD-10-CM | POA: Diagnosis present

## 2017-02-18 DIAGNOSIS — Z8542 Personal history of malignant neoplasm of other parts of uterus: Secondary | ICD-10-CM | POA: Diagnosis not present

## 2017-02-18 DIAGNOSIS — F329 Major depressive disorder, single episode, unspecified: Secondary | ICD-10-CM | POA: Diagnosis not present

## 2017-02-18 DIAGNOSIS — N390 Urinary tract infection, site not specified: Secondary | ICD-10-CM | POA: Diagnosis present

## 2017-02-18 DIAGNOSIS — Z7901 Long term (current) use of anticoagulants: Secondary | ICD-10-CM

## 2017-02-18 DIAGNOSIS — A419 Sepsis, unspecified organism: Secondary | ICD-10-CM

## 2017-02-18 DIAGNOSIS — G629 Polyneuropathy, unspecified: Secondary | ICD-10-CM | POA: Diagnosis not present

## 2017-02-18 DIAGNOSIS — Z66 Do not resuscitate: Secondary | ICD-10-CM | POA: Diagnosis not present

## 2017-02-18 DIAGNOSIS — N3000 Acute cystitis without hematuria: Secondary | ICD-10-CM | POA: Diagnosis not present

## 2017-02-18 DIAGNOSIS — G63 Polyneuropathy in diseases classified elsewhere: Secondary | ICD-10-CM | POA: Diagnosis present

## 2017-02-18 DIAGNOSIS — Z888 Allergy status to other drugs, medicaments and biological substances status: Secondary | ICD-10-CM | POA: Diagnosis not present

## 2017-02-18 DIAGNOSIS — I480 Paroxysmal atrial fibrillation: Secondary | ICD-10-CM | POA: Diagnosis not present

## 2017-02-18 DIAGNOSIS — I10 Essential (primary) hypertension: Secondary | ICD-10-CM | POA: Diagnosis present

## 2017-02-18 DIAGNOSIS — E876 Hypokalemia: Secondary | ICD-10-CM | POA: Diagnosis not present

## 2017-02-18 DIAGNOSIS — R0602 Shortness of breath: Secondary | ICD-10-CM | POA: Diagnosis not present

## 2017-02-18 LAB — URINALYSIS, ROUTINE W REFLEX MICROSCOPIC
BILIRUBIN URINE: NEGATIVE
Glucose, UA: NEGATIVE mg/dL
Hgb urine dipstick: NEGATIVE
Ketones, ur: NEGATIVE mg/dL
NITRITE: NEGATIVE
PH: 6 (ref 5.0–8.0)
Protein, ur: NEGATIVE mg/dL
SPECIFIC GRAVITY, URINE: 1.003 — AB (ref 1.005–1.030)

## 2017-02-18 LAB — BASIC METABOLIC PANEL
Anion gap: 12 (ref 5–15)
BUN: 8 mg/dL (ref 6–20)
CHLORIDE: 110 mmol/L (ref 101–111)
CO2: 20 mmol/L — ABNORMAL LOW (ref 22–32)
CREATININE: 0.71 mg/dL (ref 0.44–1.00)
Calcium: 5.5 mg/dL — CL (ref 8.9–10.3)
GFR calc non Af Amer: >60 mL/min (ref >60)
Glucose, Bld: 92 mg/dL (ref 65–99)
POTASSIUM: 2.7 mmol/L — AB (ref 3.5–5.1)
Sodium: 142 mmol/L (ref 135–145)

## 2017-02-18 LAB — COMPREHENSIVE METABOLIC PANEL
ALK PHOS: 97 U/L (ref 38–126)
ALT: 16 U/L (ref 14–54)
AST: 35 U/L (ref 15–41)
Albumin: 2.9 g/dL — ABNORMAL LOW (ref 3.5–5.0)
Anion gap: 15 (ref 5–15)
BILIRUBIN TOTAL: 0.7 mg/dL (ref 0.3–1.2)
BUN: 9 mg/dL (ref 6–20)
CO2: 22 mmol/L (ref 22–32)
CREATININE: 0.83 mg/dL (ref 0.44–1.00)
Calcium: 5.7 mg/dL — CL (ref 8.9–10.3)
Chloride: 102 mmol/L (ref 101–111)
GFR calc Af Amer: >60 mL/min (ref >60)
GFR, EST NON AFRICAN AMERICAN: 59 mL/min — AB (ref >60)
Glucose, Bld: 119 mg/dL — ABNORMAL HIGH (ref 65–99)
Potassium: 3.1 mmol/L — ABNORMAL LOW (ref 3.5–5.1)
Sodium: 139 mmol/L (ref 135–145)
TOTAL PROTEIN: 5.9 g/dL — AB (ref 6.5–8.1)

## 2017-02-18 LAB — CBC
HCT: 38.2 % (ref 36.0–46.0)
Hemoglobin: 12.8 g/dL (ref 12.0–15.0)
MCH: 29.8 pg (ref 26.0–34.0)
MCHC: 33.5 g/dL (ref 30.0–36.0)
MCV: 88.8 fL (ref 78.0–100.0)
PLATELETS: 446 10*3/uL — AB (ref 150–400)
RBC: 4.3 MIL/uL (ref 3.87–5.11)
RDW: 15.4 % (ref 11.5–15.5)
WBC: 10.8 10*3/uL — AB (ref 4.0–10.5)

## 2017-02-18 LAB — MAGNESIUM: MAGNESIUM: 0.3 mg/dL — AB (ref 1.7–2.4)

## 2017-02-18 LAB — I-STAT CG4 LACTIC ACID, ED: Lactic Acid, Venous: 2.55 mmol/L (ref 0.5–1.9)

## 2017-02-18 LAB — PHOSPHORUS: Phosphorus: 3.6 mg/dL (ref 2.5–4.6)

## 2017-02-18 LAB — LIPASE, BLOOD: Lipase: 19 U/L (ref 11–51)

## 2017-02-18 MED ORDER — ONDANSETRON HCL 4 MG/2ML IJ SOLN
4.0000 mg | Freq: Once | INTRAMUSCULAR | Status: AC
  Administered 2017-02-18: 4 mg via INTRAVENOUS
  Filled 2017-02-18: qty 2

## 2017-02-18 MED ORDER — DEXTROSE 5 % IV SOLN
1.0000 g | INTRAVENOUS | Status: DC
  Administered 2017-02-19: 1 g via INTRAVENOUS
  Filled 2017-02-18 (×2): qty 10

## 2017-02-18 MED ORDER — SODIUM CHLORIDE 0.9 % IV BOLUS (SEPSIS)
1000.0000 mL | Freq: Once | INTRAVENOUS | Status: AC
  Administered 2017-02-18: 1000 mL via INTRAVENOUS

## 2017-02-18 MED ORDER — GABAPENTIN 100 MG PO CAPS: 100.0000 mg | ORAL_CAPSULE | Freq: Two times a day (BID) | ORAL | Status: DC | PRN

## 2017-02-18 MED ORDER — SODIUM CHLORIDE 0.9 % IV BOLUS (SEPSIS)
1000.0000 mL | Freq: Once | INTRAVENOUS | Status: AC
Start: 2017-02-18 — End: 2017-02-18
  Administered 2017-02-18: 1000 mL via INTRAVENOUS

## 2017-02-18 MED ORDER — MAGNESIUM SULFATE 4 GM/100ML IV SOLN
4.0000 g | Freq: Once | INTRAVENOUS | Status: AC
  Administered 2017-02-18: 4 g via INTRAVENOUS
  Filled 2017-02-18: qty 100

## 2017-02-18 MED ORDER — SODIUM CHLORIDE 0.9 % IV SOLN
1.0000 g | Freq: Once | INTRAVENOUS | Status: AC
  Administered 2017-02-18: 1 g via INTRAVENOUS
  Filled 2017-02-18: qty 10

## 2017-02-18 MED ORDER — RALOXIFENE HCL 60 MG PO TABS
60.0000 mg | ORAL_TABLET | Freq: Every day | ORAL | Status: DC
  Administered 2017-02-18 – 2017-02-20 (×3): 60 mg via ORAL
  Filled 2017-02-18 (×3): qty 1

## 2017-02-18 MED ORDER — METOPROLOL TARTRATE 25 MG PO TABS
25.0000 mg | ORAL_TABLET | Freq: Two times a day (BID) | ORAL | Status: DC
  Administered 2017-02-18 – 2017-02-20 (×4): 25 mg via ORAL
  Filled 2017-02-18 (×4): qty 1

## 2017-02-18 MED ORDER — LOPERAMIDE HCL 2 MG PO CAPS: 2.0000 mg | ORAL_CAPSULE | Freq: Every day | ORAL | Status: DC | PRN

## 2017-02-18 MED ORDER — DEXTROSE 5 % IV SOLN
1.0000 g | Freq: Once | INTRAVENOUS | Status: AC
  Administered 2017-02-18: 1 g via INTRAVENOUS
  Filled 2017-02-18: qty 10

## 2017-02-18 MED ORDER — MECLIZINE HCL 25 MG PO TABS: 12.5000 mg | ORAL_TABLET | Freq: Three times a day (TID) | ORAL | Status: DC | PRN

## 2017-02-18 MED ORDER — MIRTAZAPINE 7.5 MG PO TABS
7.5000 mg | ORAL_TABLET | Freq: Every day | ORAL | Status: DC
  Administered 2017-02-18 – 2017-02-19 (×2): 7.5 mg via ORAL
  Filled 2017-02-18 (×2): qty 1

## 2017-02-18 MED ORDER — ACETAMINOPHEN 500 MG PO TABS
1000.0000 mg | ORAL_TABLET | Freq: Once | ORAL | Status: AC
  Administered 2017-02-18: 1000 mg via ORAL
  Filled 2017-02-18: qty 2

## 2017-02-18 MED ORDER — LEVOTHYROXINE SODIUM 50 MCG PO TABS
50.0000 ug | ORAL_TABLET | Freq: Every day | ORAL | Status: DC
  Administered 2017-02-19 – 2017-02-20 (×2): 50 ug via ORAL
  Filled 2017-02-18 (×2): qty 1

## 2017-02-18 MED ORDER — POTASSIUM CHLORIDE 10 MEQ/100ML IV SOLN
10.0000 meq | INTRAVENOUS | Status: AC
  Administered 2017-02-18 (×3): 10 meq via INTRAVENOUS
  Filled 2017-02-18 (×3): qty 100

## 2017-02-18 MED ORDER — LORAZEPAM 0.5 MG PO TABS: 0.5000 mg | ORAL_TABLET | Freq: Every day | ORAL | Status: DC | PRN

## 2017-02-18 MED ORDER — RIVAROXABAN 10 MG PO TABS
10.0000 mg | ORAL_TABLET | Freq: Every day | ORAL | Status: DC
  Administered 2017-02-18 – 2017-02-20 (×3): 10 mg via ORAL
  Filled 2017-02-18 (×3): qty 1

## 2017-02-18 MED ORDER — METHOCARBAMOL 500 MG PO TABS: 500.0000 mg | ORAL_TABLET | Freq: Three times a day (TID) | ORAL | Status: DC | PRN

## 2017-02-18 MED ORDER — ENSURE ENLIVE PO LIQD
237.0000 mL | Freq: Two times a day (BID) | ORAL | Status: DC
  Administered 2017-02-19: 237 mL via ORAL

## 2017-02-18 MED ORDER — ACETAMINOPHEN 325 MG PO TABS
650.0000 mg | ORAL_TABLET | Freq: Once | ORAL | Status: AC
  Administered 2017-02-18: 650 mg via ORAL
  Filled 2017-02-18: qty 2

## 2017-02-18 MED ORDER — PANTOPRAZOLE SODIUM 40 MG PO TBEC
40.0000 mg | DELAYED_RELEASE_TABLET | Freq: Every day | ORAL | Status: DC
  Administered 2017-02-18 – 2017-02-20 (×3): 40 mg via ORAL
  Filled 2017-02-18 (×3): qty 1

## 2017-02-18 NOTE — ED Triage Notes (Signed)
Pt reports n/v/lower back pain for the past few weeks, reports she had some trouble urinating this am. States she has chronic diarrhea. Pt a/ox4, resp e/u, nad.

## 2017-02-18 NOTE — ED Notes (Signed)
Critical Lab Value: read back and alerted physician   K 2.7 Ca 5.5

## 2017-02-18 NOTE — ED Notes (Signed)
best IV access in hand pt has very small veins, instructed pt to call if IV hurts and IV patent as Ca was started

## 2017-02-18 NOTE — Progress Notes (Signed)
New Admission Note:   Arrival Method: Bed Mental Orientation: A&O X4 Telemetry: Initiated Assessment: Completed Skin: See flowsheets IV:  Clean, Dry, Intact Pain: Denies Safety Measures: Safety Fall Prevention Plan has been given, discussed and signed Admission: Completed Unit Orientation: Patient has been orientated to the room, unit and staff.  Family: daughter at bedside.   Orders have been reviewed and implemented. Will continue to monitor the patient. Call light has been placed within reach and bed alarm has been activated.    Shah Insley Mabe RN, BSN 

## 2017-02-18 NOTE — ED Notes (Signed)
Called lab to add on mg. 

## 2017-02-18 NOTE — ED Notes (Signed)
Critical Lab Value: read back and alerted physician   Mg 0.3

## 2017-02-18 NOTE — ED Notes (Signed)
Paused K to run in Ca, will restart Potassium when Ca finished running

## 2017-02-18 NOTE — ED Notes (Signed)
Attempted report x1. 

## 2017-02-18 NOTE — H&P (Signed)
History and Physical    Teresa Gonzalez OZH:[id number] DOB: 1921/10/17 DOA: 02/18/2017  PCP: Shon Baton, MD  Patient coming from: Home  Chief Complaint: Generalized weakness  HPI: Teresa Gonzalez is a 83 y.o. female with medical history significant of uterine cancer status post radiation, status post small bowel resection with chronic diarrhea, hypothyroidism, peripheral neuropathy, depression, PAF on Xarelto, who comes into the emergency department with complaints of generalized weakness. She was treated for a UTI about 3 weeks ago with oral antibiotics. She thought she had felt a little bit better after that. Over the past few days however, she has had nausea, small amounts of vomiting, and generalized weakness. She denies any fevers, chills, chest pain or shortness of breath. No abdominal pain, although she does have some back pain with radiation down her left leg, which she says it's due to sciatica. She does have chronic diarrhea. She denies any worsening diarrhea. She also has chronic urinary frequency but denies any dysuria or increase in urinary frequency from her baseline.  ED Course: Labs obtained which showed hypokalemia, hypocalcemia. She was given 40 mEq potassium chloride, as well as 1g calcium gluconate. Urinalysis obtained with possible UTI and patient was started on Rocephin.  Review of Systems: As per HPI otherwise 10 point review of systems negative.   Past Medical History:  Diagnosis Date  . Depression   . Depression   . Hypothyroidism   . Insomnia   . Left carpal tunnel syndrome    By nerve conduction study  . Peripheral neuropathy   . S/P small bowel resection    with Diarrhea  . Status post radiation therapy   . Uterine cancer ([place])   . Vitamin D deficiency     Past Surgical History:  Procedure Laterality Date  . ABDOMINAL HYSTERECTOMY    . CATARACT EXTRACTION Bilateral      reports that she has never smoked. She has never used smokeless tobacco. She reports  that she does not drink alcohol or use drugs.  Allergies  Allergen Reactions  . Codeine Itching  . Penicillins Swelling    Family History  Problem Relation Age of Onset  . Stroke Mother   . Aneurysm Father   . Diabetes Brother     Prior to Admission medications   Medication Sig Start Date End Date Taking? Authorizing Provider  Calcium Carbonate (CALTRATE 600 PO) Take 600 mg by mouth 3 (three) times daily.    Yes [provider]  Cholecalciferol (VITAMIN D3) 1000 units CAPS Take 1,000 Units by mouth daily.   Yes [provider]  gabapentin (NEURONTIN) 100 MG capsule Take 100 mg by mouth 2 (two) times daily as needed (nerve pain).   Yes [provider]  loperamide (IMODIUM) 2 MG capsule Take 1 capsule (2 mg total) by mouth 5 (five) times daily as needed for diarrhea or loose stools. 10/23/13  Yes Shon Baton, MD  LORazepam (ATIVAN) 0.5 MG tablet Take 0.5 mg by mouth daily as needed for anxiety. 01/26/17  Yes [provider]  meclizine (ANTIVERT) 12.5 MG tablet Take 12.5 mg by mouth 3 (three) times daily as needed for dizziness.   Yes [provider]  methocarbamol (ROBAXIN) 500 MG tablet Take 500 mg by mouth every 8 (eight) hours as needed for muscle spasms.   Yes [provider]  metoprolol tartrate (LOPRESSOR) 25 MG tablet Take 1 tablet (25 mg total) by mouth 2 (two) times daily. 08/06/15  Yes Cherene Altes, MD  mirtazapine (REMERON) 15 MG tablet Take 7.5 mg by mouth at bedtime.   Yes [provider]  Multiple Vitamins-Minerals (MULTIVITAMIN WITH MINERALS) tablet Take 1 tablet by mouth daily.   Yes [provider]  omeprazole (PRILOSEC) 40 MG capsule Take 40 mg by mouth daily.   Yes [provider]  raloxifene (EVISTA) 60 MG tablet Take 60 mg by mouth daily.   Yes [provider]  rivaroxaban (XARELTO) 10 MG TABS tablet Take 10 mg by mouth daily.   Yes [provider]  SYNTHROID 50 MCG  tablet Take 50 mcg by mouth daily. 07/01/15  Yes [provider]  VITAMIN B1-B12 IJ Inject 1 mL into the skin every 21 ( twenty-one) days.    Yes [provider]  Vitamin D, Ergocalciferol, (DRISDOL) 50000 UNITS CAPS Take 50,000 Units by mouth See admin instructions. Takes on Monday, Wednesday, Friday and Sunday   Yes [provider]  apixaban (ELIQUIS) 2.5 MG TABS tablet Take 1 tablet (2.5 mg total) by mouth 2 (two) times daily. Patient not taking: Reported on 02/18/2017 08/06/15   Cherene Altes, MD    Physical Exam: Vitals:   02/18/17 1630 02/18/17 1700 02/18/17 1715 02/18/17 1730  BP: (!) 153/79 113/83 140/72 (!) 136/93  Pulse: 89 87 86 87  Resp:  (!) 25 (!) 26   Temp:      TempSrc:      SpO2: 93% 97% 94% 97%    Constitutional: NAD, calm, comfortable Eyes: PERRL, lids and conjunctivae normal ENMT: Mucous membranes are moist. Posterior pharynx clear of any exudate or lesions.Normal dentition.  Neck: normal, supple, no masses, no thyromegaly Respiratory: clear to auscultation bilaterally, no wheezing, no crackles. Normal respiratory effort. No accessory muscle use.  Cardiovascular: Regular rate and rhythm, no murmurs / rubs / gallops. No extremity edema. ses. No carotid bruits.  Abdomen: no tenderness, no masses palpated. No hepatosplenomegaly. Bowel sounds positive.  Musculoskeletal: no clubbing / cyanosis. No joint deformity upper and lower extremities. Normal muscle tone.  Skin: no rashes, lesions, ulcers. No induration Neurologic: CN 2-12 grossly intact. Strength equal  Psychiatric: Normal judgment and insight. Alert and oriented x 3. Normal mood.   Labs on Admission: I have personally reviewed following labs and imaging studies  CBC:  Recent Labs Lab 02/18/17 1100  WBC 10.8*  HGB 12.8  HCT 38.2  MCV 88.8  PLT 606*   Basic Metabolic Panel:  Recent Labs Lab 02/18/17 1100 02/18/17 1512  NA 139 142  K 3.1* 2.7*  CL 102 110  CO2 22  20*  GLUCOSE 119* 92  BUN 9 8  CREATININE 0.83 0.71  CALCIUM 5.7* 5.5*  MG  --  0.3*   GFR: CrCl cannot be calculated (Unknown ideal weight.). Liver Function Tests:  Recent Labs Lab 02/18/17 1100  AST 35  ALT 16  ALKPHOS 97  BILITOT 0.7  PROT 5.9*  ALBUMIN 2.9*    Recent Labs Lab 02/18/17 1100  LIPASE 19   No results for input(s): AMMONIA in the last 168 hours. Coagulation Profile: No results for input(s): INR, PROTIME in the last 168 hours. Cardiac Enzymes: No results for input(s): CKTOTAL, CKMB, CKMBINDEX, TROPONINI in the last 168 hours. BNP (last 3 results) No results for input(s): PROBNP in the last 8760 hours. HbA1C: No results for input(s): HGBA1C in the last 72 hours. CBG: No results for input(s): GLUCAP in the last 168 hours. Lipid Profile: No results for input(s): CHOL, HDL, LDLCALC, TRIG, CHOLHDL, LDLDIRECT  in the last 72 hours. Thyroid Function Tests: No results for input(s): TSH, T4TOTAL, FREET4, T3FREE, THYROIDAB in the last 72 hours. Anemia Panel: No results for input(s): VITAMINB12, FOLATE, FERRITIN, TIBC, IRON, RETICCTPCT in the last 72 hours. Urine analysis:    Component Value Date/Time   COLORURINE YELLOW 02/18/2017 [place] 02/18/2017 1217   LABSPEC 1.003 (L) 02/18/2017 1217   PHURINE 6.0 02/18/2017 1217   GLUCOSEU NEGATIVE 02/18/2017 1217   HGBUR NEGATIVE 02/18/2017 [place] 02/18/2017 1217   KETONESUR NEGATIVE 02/18/2017 1217   PROTEINUR NEGATIVE 02/18/2017 1217   UROBILINOGEN 0.2 08/01/2015 0125   NITRITE NEGATIVE 02/18/2017 1217   LEUKOCYTESUR MODERATE (A) 02/18/2017 1217   Sepsis Labs: !!!!!!!!!!!!!!!!!!!!!!!!!!!!!!!!!!!!!!!!!!!! @LABRCNTIP (procalcitonin:4,lacticidven:4) )No results found for this or any previous visit (from the past 240 hour(s)).   Radiological Exams on Admission: Dg Chest 2 View  Result Date: 02/18/2017 CLINICAL DATA:  Shortness of breath EXAM: CHEST  2 VIEW COMPARISON:   11/15/2016 FINDINGS: Small left pleural effusion. Mild left basilar opacity, likely atelectasis. No pneumothorax. The heart is normal in size. Visualized osseous structures are within normal limits. IMPRESSION: Small left pleural effusion. Mild left basilar opacity, likely atelectasis. Electronically Signed   By: Julian Hy M.D.   On: 02/18/2017 15:05    EKG: Independently reviewed. A Fib rate well controlled   Assessment/Plan Principal Problem:   Generalized weakness Active Problems:   Polyneuropathy in other diseases classified elsewhere [facility])   Atrial fibrillation (HCC)   Chronic diarrhea   UTI (urinary tract infection)   Hypokalemia   Hypocalcemia   Hypomagnesemia  Generalized weakness -Could be secondary to electrolyte abnormalities, possible UTI, deconditioning -PT and OT to evaluate  Hypokalemia -Likely due to GI loss, she admits to vomiting as well as chronic diarrhea, also with hypomagnesemia  -Replace, trend   Hypomagnesemia -Replace, trend  Hypocalcemia -Likely due to malabsorption s/p small bowel resection, hypomagnesemia  -Check ionized Ca, vit D, PTH, phos -Replaced in ED -Repeat in AM   Pyuria -Recently tx for UTI, but now with pyuria, no dysuria -Received rocephin in ED -Await urine culture -Blood cultures pending    Parox A Fib -Continue xarelto   Essential hypertension -Continue Lopressor  GERD -Continue PPI  Peripheral neuropathy -Continue Neurontin  Chronic back pain with sciatica -Continue Robaxin  Chronic diarrhea, status post small bowel resection -Continue Imodium  Hypothyroidism -Continue synthroid    DVT prophylaxis: Xarelto Code Status: DNR  Family Communication: daughter at bedside Disposition Plan: pending improvement Consults called: none   Admission status: observation  It is my clinical opinion that referral for OBSERVATION is reasonable and necessary in this 83 y.o. year old female  presenting with symptoms  of generalized weakness, concerning for electrolyte abnormalities   The aforementioned taken together are felt to place the patient at high risk for further  clinical deterioration. However it is anticipated that the patient may be medically stable for discharge from the hospital within 24 to 48 hours.  Dessa Phi, DO [facility] [email or web address] Password [email or web address] 02/18/2017, 5:59 PM

## 2017-02-18 NOTE — ED Provider Notes (Signed)
[facility] DEPT Provider Note   CSN: [id number] Arrival date & time: 02/18/17  1054     History   Chief Complaint Chief Complaint  Patient presents with  . Emesis  . Back Pain    HPI Teresa Gonzalez is a 83 y.o. female.  HPI  Presents with generalized weakness and nausea Lower back pain for the last 2-3 weeks, is in very lower back radiates across buttocks.  Severe generalized weakness started in the last several days. Normally walks with walker every day but over the last few days too weak to do so.  Severe fatigue.  Shortness of breath worse over the last day, has been present longer. No cough. No chest pain.  Nausea. Vomited this Am a little on the way here.  3 days of nausea.  No urinary symptoms or changes.  2 weeks ago did have urinary problem, was diagnosed with UTI, got medication. Was told hip pain was sciatica. No recent falls, no numbness/weakness/loss control bowel or bladder.  Past Medical History:  Diagnosis Date  . Depression   . Depression   . Hypothyroidism   . Insomnia   . Left carpal tunnel syndrome    By nerve conduction study  . Peripheral neuropathy   . S/P small bowel resection    with Diarrhea  . Status post radiation therapy   . Uterine cancer ([place])   . Vitamin D deficiency     Patient Active Problem List   Diagnosis Date Noted  . UTI (urinary tract infection) 02/18/2017  . Generalized weakness 02/18/2017  . Hypokalemia 02/18/2017  . Hypocalcemia 02/18/2017  . Hypomagnesemia 02/18/2017  . Chronic diarrhea   . Atrial fibrillation ([place])   . Osteoporosis   . Abnormality of gait 01/11/2012  . Disturbance of skin sensation 01/11/2012  . Degeneration of lumbar or lumbosacral intervertebral disc 01/11/2012  . Lumbosacral root lesions, not elsewhere classified 01/11/2012  . Polyneuropathy in other diseases classified elsewhere ([place]) 01/11/2012    Past Surgical History:  Procedure Laterality Date  . ABDOMINAL HYSTERECTOMY    . CATARACT  EXTRACTION Bilateral     OB History    No data available       Home Medications    Prior to Admission medications   Medication Sig Start Date End Date Taking? Authorizing Provider  Calcium Carbonate (CALTRATE 600 PO) Take 600 mg by mouth 3 (three) times daily.    Yes [provider]  Cholecalciferol (VITAMIN D3) 1000 units CAPS Take 1,000 Units by mouth daily.   Yes [provider]  gabapentin (NEURONTIN) 100 MG capsule Take 100 mg by mouth 2 (two) times daily as needed (nerve pain).   Yes [provider]  loperamide (IMODIUM) 2 MG capsule Take 1 capsule (2 mg total) by mouth 5 (five) times daily as needed for diarrhea or loose stools. 10/23/13  Yes Shon Baton, MD  LORazepam (ATIVAN) 0.5 MG tablet Take 0.5 mg by mouth daily as needed for anxiety. 01/26/17  Yes [provider]  meclizine (ANTIVERT) 12.5 MG tablet Take 12.5 mg by mouth 3 (three) times daily as needed for dizziness.   Yes [provider]  methocarbamol (ROBAXIN) 500 MG tablet Take 500 mg by mouth every 8 (eight) hours as needed for muscle spasms.   Yes [provider]  metoprolol tartrate (LOPRESSOR) 25 MG tablet Take 1 tablet (25 mg total) by mouth 2 (two) times daily. 08/06/15  Yes Cherene Altes, MD  mirtazapine (REMERON) 15 MG tablet Take  7.5 mg by mouth at bedtime.   Yes [provider]  Multiple Vitamins-Minerals (MULTIVITAMIN WITH MINERALS) tablet Take 1 tablet by mouth daily.   Yes [provider]  omeprazole (PRILOSEC) 40 MG capsule Take 40 mg by mouth daily.   Yes [provider]  raloxifene (EVISTA) 60 MG tablet Take 60 mg by mouth daily.   Yes [provider]  rivaroxaban (XARELTO) 10 MG TABS tablet Take 10 mg by mouth daily.   Yes [provider]  SYNTHROID 50 MCG tablet Take 50 mcg by mouth daily. 07/01/15  Yes [provider]  VITAMIN B1-B12 IJ Inject 1 mL into the skin every 21 ( twenty-one) days.     Yes [provider]  Vitamin D, Ergocalciferol, (DRISDOL) 50000 UNITS CAPS Take 50,000 Units by mouth See admin instructions. Takes on Monday, Wednesday, Friday and Sunday   Yes [provider]    Family History Family History  Problem Relation Age of Onset  . Stroke Mother   . Aneurysm Father   . Diabetes Brother     Social History Social History  Substance Use Topics  . Smoking status: Never Smoker  . Smokeless tobacco: Never Used  . Alcohol use No     Allergies   Codeine and Penicillins   Review of Systems Review of Systems  Constitutional: Positive for appetite change and fatigue. Negative for fever.  HENT: Negative for sore throat.   Eyes: Negative for visual disturbance.  Respiratory: Negative for cough and shortness of breath.   Cardiovascular: Negative for chest pain.  Gastrointestinal: Positive for diarrhea, nausea and vomiting. Negative for abdominal pain.  Genitourinary: Positive for frequency. Negative for difficulty urinating.  Musculoskeletal: Positive for back pain. Negative for neck pain.  Skin: Negative for rash.  Neurological: Negative for syncope and headaches.     Physical Exam Updated Vital Signs BP 139/82   Pulse 89   Temp 97.4 F (36.3 C) (Oral)   Resp (!) 26   SpO2 97%   Physical Exam  Constitutional: She is oriented to person, place, and time. She appears well-developed and well-nourished. No distress.  HENT:  Head: Normocephalic and atraumatic.  Eyes: Conjunctivae and EOM are normal.  Neck: Normal range of motion.  Cardiovascular: Normal rate, regular rhythm, normal heart sounds and intact distal pulses.  Exam reveals no gallop and no friction rub.   No murmur heard. Pulmonary/Chest: Effort normal and breath sounds normal. No respiratory distress. She has no wheezes. She has no rales.  Abdominal: Soft. She exhibits no distension. There is no tenderness. There is no guarding.  Musculoskeletal: She exhibits no edema.        Lumbar back: She exhibits tenderness and bony tenderness.  Neurological: She is alert and oriented to person, place, and time.  Skin: Skin is warm and dry. No rash noted. She is not diaphoretic. No erythema.  Nursing note and vitals reviewed.    ED Treatments / Results  Labs (all labs ordered are listed, but only abnormal results are displayed) Labs Reviewed  COMPREHENSIVE METABOLIC PANEL - Abnormal; Notable for the following:       Result Value   Potassium 3.1 (*)    Glucose, Bld 119 (*)    Calcium 5.7 (*)    Total Protein 5.9 (*)    Albumin 2.9 (*)    GFR calc non Af Amer 59 (*)    All other components within normal limits  CBC - Abnormal; Notable for the following:  WBC 10.8 (*)    Platelets 446 (*)    All other components within normal limits  URINALYSIS, ROUTINE W REFLEX MICROSCOPIC - Abnormal; Notable for the following:    Specific Gravity, Urine 1.003 (*)    Leukocytes, UA MODERATE (*)    Bacteria, UA MANY (*)    Squamous Epithelial / LPF 0-5 (*)    All other components within normal limits  BASIC METABOLIC PANEL - Abnormal; Notable for the following:    Potassium 2.7 (*)    CO2 20 (*)    Calcium 5.5 (*)    All other components within normal limits  MAGNESIUM - Abnormal; Notable for the following:    Magnesium 0.3 (*)    All other components within normal limits  I-STAT CG4 LACTIC ACID, ED - Abnormal; Notable for the following:    Lactic Acid, Venous 2.55 (*)    All other components within normal limits  CULTURE, BLOOD (ROUTINE X 2)  CULTURE, BLOOD (ROUTINE X 2)  URINE CULTURE  LIPASE, BLOOD  CALCIUM, IONIZED  I-STAT CG4 LACTIC ACID, ED    EKG  EKG Interpretation  Date/Time:  Friday Feb 18 2017 15:08:08 EDT Ventricular Rate:  93 PR Interval:    QRS Duration: 127 QT Interval:  423 QTC Calculation: 527 R Axis:   -50 Text Interpretation:  Sinus rhythm Supraventricular bigeminy Nonspecific IVCD with LAD LVH with secondary repolarization  abnormality Inferior infarct, old Anterior infarct, old No significant change since last tracing Confirmed by [facility] MD, Reha Martinovich ([id number]) on 02/18/2017 4:57:59 PM       Radiology Dg Chest 2 View  Result Date: 02/18/2017 CLINICAL DATA:  Shortness of breath EXAM: CHEST  2 VIEW COMPARISON:  11/15/2016 FINDINGS: Small left pleural effusion. Mild left basilar opacity, likely atelectasis. No pneumothorax. The heart is normal in size. Visualized osseous structures are within normal limits. IMPRESSION: Small left pleural effusion. Mild left basilar opacity, likely atelectasis. Electronically Signed   By: Julian Hy M.D.   On: 02/18/2017 15:05    Procedures Procedures (including critical care time)  Medications Ordered in ED Medications  potassium chloride 10 mEq in 100 mL IVPB (10 mEq Intravenous New Bag/Given 02/18/17 1808)  magnesium sulfate IVPB 4 g 100 mL (4 g Intravenous New Bag/Given 02/18/17 1807)  sodium chloride 0.9 % bolus 1,000 mL (0 mLs Intravenous Stopped 02/18/17 1400)  cefTRIAXone (ROCEPHIN) 1 g in dextrose 5 % 50 mL IVPB (0 g Intravenous Stopped 02/18/17 1600)  ondansetron (ZOFRAN) injection 4 mg (4 mg Intravenous Given 02/18/17 1528)  acetaminophen (TYLENOL) tablet 1,000 mg (1,000 mg Oral Given 02/18/17 1529)  sodium chloride 0.9 % bolus 1,000 mL (1,000 mLs Intravenous New Bag/Given 02/18/17 1651)  calcium gluconate 1 g in sodium chloride 0.9 % 100 mL IVPB (0 g Intravenous Stopped 02/18/17 1738)   CRITICAL CARE: hypocalcemia Performed by: Alvino Chapel   Total critical care time: 30 minutes  Critical care time was exclusive of separately billable procedures and treating other patients.  Critical care was necessary to treat or prevent imminent or life-threatening deterioration.  Critical care was time spent personally by me on the following activities: development of treatment plan with patient and/or surrogate as well as nursing, discussions with consultants,  evaluation of patient's response to treatment, examination of patient, obtaining history from patient or surrogate, ordering and performing treatments and interventions, ordering and review of laboratory studies, ordering and review of radiographic studies, pulse oximetry and re-evaluation of patient's condition.   Initial Impression /  Assessment and Plan / ED Course  I have reviewed the triage vital signs and the nursing notes.  Pertinent labs & imaging results that were available during my care of the patient were reviewed by me and considered in my medical decision making (see chart for details).    83 year old female with a history of paroxysmal atrial fibrillation, hypothyroidism, chronic diarrhea who presents with concern for generalized weakness and nausea for 3 days. Reports she's also had back pain for the past 2-3 weeks. Denies any trauma, has no numbness or weakness, have low suspicion for cauda equina or other acute abnormalities. No CVA tenderness.  Labs significant for hypokalemia, hypocalcemia. Labs repeated given significant change and calcium levels from prior. Corrected calcium is 6.8.  Patient with urinary tract infection on urinalysis. Blood cultures are drawn and she was given Rocephin. Given 2 L of normal saline.  Repeat labs show hypokalemia and hypocalcemia. No EKG changes. Lactic acid 2.5, sepsis vs dehydration. Given IV fluids greater than 30cc/kg, rocephin. She was given IV calcium, and IV potassium replacement. Magnesium level was ordered. Possible that these electrolyte problems are secondary to her chronic diarrhea, however unclear reason for significant worsening at this time. We'll admit patient to the hospitalist service for treatment of her urinary tract infection.    Final Clinical Impressions(s) / ED Diagnoses   Final diagnoses:  Urinary tract infection without hematuria, site unspecified  Hypocalcemia  Hypokalemia  Generalized weakness  Sepsis, due to  unspecified organism [facility])    New Prescriptions Current Discharge Medication List       Gareth Morgan, MD 02/18/17 1818

## 2017-02-19 DIAGNOSIS — I480 Paroxysmal atrial fibrillation: Secondary | ICD-10-CM | POA: Diagnosis not present

## 2017-02-19 DIAGNOSIS — E876 Hypokalemia: Secondary | ICD-10-CM | POA: Diagnosis not present

## 2017-02-19 DIAGNOSIS — Z66 Do not resuscitate: Secondary | ICD-10-CM | POA: Diagnosis not present

## 2017-02-19 DIAGNOSIS — Z8542 Personal history of malignant neoplasm of other parts of uterus: Secondary | ICD-10-CM | POA: Diagnosis not present

## 2017-02-19 DIAGNOSIS — N3000 Acute cystitis without hematuria: Secondary | ICD-10-CM | POA: Diagnosis not present

## 2017-02-19 DIAGNOSIS — N390 Urinary tract infection, site not specified: Secondary | ICD-10-CM

## 2017-02-19 DIAGNOSIS — E162 Hypoglycemia, unspecified: Secondary | ICD-10-CM | POA: Diagnosis present

## 2017-02-19 DIAGNOSIS — G629 Polyneuropathy, unspecified: Secondary | ICD-10-CM | POA: Diagnosis not present

## 2017-02-19 DIAGNOSIS — E890 Postprocedural hypothyroidism: Secondary | ICD-10-CM | POA: Diagnosis not present

## 2017-02-19 DIAGNOSIS — F329 Major depressive disorder, single episode, unspecified: Secondary | ICD-10-CM | POA: Diagnosis not present

## 2017-02-19 DIAGNOSIS — R531 Weakness: Secondary | ICD-10-CM | POA: Diagnosis not present

## 2017-02-19 LAB — CBC
HCT: 35.1 % — ABNORMAL LOW (ref 36.0–46.0)
Hemoglobin: 12 g/dL (ref 12.0–15.0)
MCH: 30.1 pg (ref 26.0–34.0)
MCHC: 34.2 g/dL (ref 30.0–36.0)
MCV: 88 fL (ref 78.0–100.0)
Platelets: 399 10*3/uL (ref 150–400)
RBC: 3.99 MIL/uL (ref 3.87–5.11)
RDW: 15.6 % — AB (ref 11.5–15.5)
WBC: 9.5 10*3/uL (ref 4.0–10.5)

## 2017-02-19 LAB — BASIC METABOLIC PANEL
Anion gap: 11 (ref 5–15)
BUN: 6 mg/dL (ref 6–20)
CO2: 19 mmol/L — ABNORMAL LOW (ref 22–32)
CREATININE: 0.73 mg/dL (ref 0.44–1.00)
Calcium: 6.6 mg/dL — ABNORMAL LOW (ref 8.9–10.3)
Chloride: 106 mmol/L (ref 101–111)
GFR calc Af Amer: >60 mL/min (ref >60)
Glucose, Bld: 144 mg/dL — ABNORMAL HIGH (ref 65–99)
Potassium: 3.3 mmol/L — ABNORMAL LOW (ref 3.5–5.1)
Sodium: 136 mmol/L (ref 135–145)

## 2017-02-19 LAB — COMPREHENSIVE METABOLIC PANEL
ALT: 19 U/L (ref 14–54)
AST: 41 U/L (ref 15–41)
Albumin: 2.5 g/dL — ABNORMAL LOW (ref 3.5–5.0)
Alkaline Phosphatase: 97 U/L (ref 38–126)
Anion gap: 13 (ref 5–15)
CHLORIDE: 109 mmol/L (ref 101–111)
CO2: 21 mmol/L — ABNORMAL LOW (ref 22–32)
Calcium: 6 mg/dL — CL (ref 8.9–10.3)
Creatinine, Ser: 0.61 mg/dL (ref 0.44–1.00)
GFR calc Af Amer: >60 mL/min (ref >60)
Glucose, Bld: 87 mg/dL (ref 65–99)
POTASSIUM: 3 mmol/L — AB (ref 3.5–5.1)
Sodium: 143 mmol/L (ref 135–145)
Total Bilirubin: 0.7 mg/dL (ref 0.3–1.2)
Total Protein: 5.5 g/dL — ABNORMAL LOW (ref 6.5–8.1)

## 2017-02-19 LAB — MAGNESIUM: Magnesium: 1.5 mg/dL — ABNORMAL LOW (ref 1.7–2.4)

## 2017-02-19 LAB — TSH: TSH: 1.786 u[IU]/mL (ref 0.350–4.500)

## 2017-02-19 MED ORDER — POTASSIUM CHLORIDE 10 MEQ/100ML IV SOLN
10.0000 meq | INTRAVENOUS | Status: AC
  Administered 2017-02-19 (×2): 10 meq via INTRAVENOUS
  Filled 2017-02-19 (×2): qty 100

## 2017-02-19 MED ORDER — POTASSIUM CHLORIDE 10 MEQ/100ML IV SOLN
10.0000 meq | Freq: Once | INTRAVENOUS | Status: AC
  Administered 2017-02-19: 10 meq via INTRAVENOUS

## 2017-02-19 MED ORDER — POTASSIUM CHLORIDE CRYS ER 20 MEQ PO TBCR
40.0000 meq | EXTENDED_RELEASE_TABLET | Freq: Two times a day (BID) | ORAL | Status: DC
  Administered 2017-02-19 – 2017-02-20 (×3): 40 meq via ORAL
  Filled 2017-02-19 (×3): qty 2

## 2017-02-19 MED ORDER — MAGNESIUM SULFATE 4 GM/100ML IV SOLN
4.0000 g | Freq: Once | INTRAVENOUS | Status: AC
  Administered 2017-02-19: 4 g via INTRAVENOUS
  Filled 2017-02-19: qty 100

## 2017-02-19 MED ORDER — BOOST / RESOURCE BREEZE PO LIQD
1.0000 | Freq: Two times a day (BID) | ORAL | Status: DC
  Administered 2017-02-20: 1 via ORAL

## 2017-02-19 MED ORDER — SODIUM CHLORIDE 0.9 % IV SOLN
1.0000 g | Freq: Once | INTRAVENOUS | Status: AC
  Administered 2017-02-19: 1 g via INTRAVENOUS
  Filled 2017-02-19: qty 10

## 2017-02-19 MED ORDER — CALCIUM CARBONATE-VITAMIN D 500-200 MG-UNIT PO TABS
1.0000 | ORAL_TABLET | Freq: Two times a day (BID) | ORAL | Status: DC
  Administered 2017-02-19 – 2017-02-20 (×3): 1 via ORAL
  Filled 2017-02-19 (×3): qty 1

## 2017-02-19 NOTE — Progress Notes (Signed)
PROGRESS NOTE    Teresa Gonzalez  FHL:[id number] DOB: 01-27-1922 DOA: 02/18/2017 PCP: Shon Baton, MD   Brief Narrative: 83 y.o. female with medical history significant of uterine cancer status post radiation, status post small bowel resection with chronic diarrhea, hypothyroidism, peripheral neuropathy, depression, PAF on Xarelto, who comes into the emergency department with complaints of generalized weakness.  Assessment & Plan:  #  Generalized weakness likely due to electrolyte imbalance and possible UTI and deconditioning. PT OT evaluation and management if light imbalance. Patient already reported feeling better today.  #Hypokalemia and hypomagnesemia, hypocalcemia likely due to increased GI loss. Patient reported she has chronic diarrhea. Repleted oral and IV potassium chloride, calcium with vitamin D and IV magnesium. Repeat lab in the morning. Follow-up PTH and vitamin D level. Check ionized calcium in the morning.  #Acute cystitis without hematuria: Follow up culture results. Continue ceftriaxone. Follow up blood culture results. Ordered urine culture although patient already received antibiotics.  #Paroxysmal atrial fibrillation: Continue xarelto  #Essential hypertension: Continue Lopressor  #Chronic diarrhea status post small bowel resection: Continue Imodium. Continue supportive care.  #Hypothyroidism: Continue Synthroid. Check TSH level.   Principal Problem:   Generalized weakness Active Problems:   Polyneuropathy in other diseases classified elsewhere [facility])   Atrial fibrillation (HCC)   Chronic diarrhea   UTI (urinary tract infection)   Hypokalemia   Hypocalcemia   Hypomagnesemia  DVT prophylaxis:Systemic anticoagulation  Code Status:DO NOT RESUSCITATE  Family Communication:Patient's daughter at bedside  Disposition Plan:Likely discharge home tomorrow    Consultants:   None  Procedures:None  Antimicrobials:Ceftriaxone  Subjective: Reported feeling  better. Denied headache, dizziness, nausea, vomiting, chest pain, shortness of breath. Daughter at bedside.  Objective: Vitals:   02/18/17 1825 02/18/17 2043 02/19/17 0426 02/19/17 0958  BP: (!) 153/76 (!) 117/49 (!) 141/84 (!) 167/80  Pulse: 96 95 94 99  Resp: 20 20 18 18   Temp: 97.8 F (36.6 C) 98.3 F (36.8 C) 97.9 F (36.6 C) 97.8 F (36.6 C)  TempSrc: Oral Oral Oral Oral  SpO2: 97% 96% 94% 93%  Weight:  48.1 kg (106 lb)      Intake/Output Summary (Last 24 hours) at 02/19/17 1316 Last data filed at 02/19/17 0900  Gross per 24 hour  Intake             2170 ml  Output             1025 ml  Net             1145 ml   Filed Weights   02/18/17 2043  Weight: 48.1 kg (106 lb)    Examination:  General exam: Appears calm and comfortable  Respiratory system: Clear to auscultation. Respiratory effort normal. No wheezing or crackle Cardiovascular system: S1 & S2 heard, RRR.  No pedal edema. Gastrointestinal system: Abdomen is nondistended, soft and nontender. Normal bowel sounds heard. Central nervous system: Alert and oriented. No focal neurological deficits. Extremities: Symmetric 5 x 5 power. Skin: No rashes, lesions or ulcers Psychiatry: Judgement and insight appear normal. Mood & affect appropriate.     Data Reviewed: I have personally reviewed following labs and imaging studies  CBC:  Recent Labs Lab 02/18/17 1100 02/19/17 0633  WBC 10.8* 9.5  HGB 12.8 12.0  HCT 38.2 35.1*  MCV 88.8 88.0  PLT 446* 373   Basic Metabolic Panel:  Recent Labs Lab 02/18/17 1100 02/18/17 1512 02/18/17 1839 02/19/17 0633  NA 139 142  --  143  K 3.1*  2.7*  --  3.0*  CL 102 110  --  109  CO2 22 20*  --  21*  GLUCOSE 119* 92  --  87  BUN 9 8  --  <5*  CREATININE 0.83 0.71  --  0.61  CALCIUM 5.7* 5.5*  --  6.0*  MG  --  0.3*  --  1.5*  PHOS  --   --  3.6  --    GFR: Estimated Creatinine Clearance: 33.4 mL/min (by C-G formula based on SCr of 0.61 mg/dL). Liver Function  Tests:  Recent Labs Lab 02/18/17 1100 02/19/17 0633  AST 35 41  ALT 16 19  ALKPHOS 97 97  BILITOT 0.7 0.7  PROT 5.9* 5.5*  ALBUMIN 2.9* 2.5*    Recent Labs Lab 02/18/17 1100  LIPASE 19   No results for input(s): AMMONIA in the last 168 hours. Coagulation Profile: No results for input(s): INR, PROTIME in the last 168 hours. Cardiac Enzymes: No results for input(s): CKTOTAL, CKMB, CKMBINDEX, TROPONINI in the last 168 hours. BNP (last 3 results) No results for input(s): PROBNP in the last 8760 hours. HbA1C: No results for input(s): HGBA1C in the last 72 hours. CBG: No results for input(s): GLUCAP in the last 168 hours. Lipid Profile: No results for input(s): CHOL, HDL, LDLCALC, TRIG, CHOLHDL, LDLDIRECT in the last 72 hours. Thyroid Function Tests: No results for input(s): TSH, T4TOTAL, FREET4, T3FREE, THYROIDAB in the last 72 hours. Anemia Panel: No results for input(s): VITAMINB12, FOLATE, FERRITIN, TIBC, IRON, RETICCTPCT in the last 72 hours. Sepsis Labs:  Recent Labs Lab 02/18/17 1606  LATICACIDVEN 2.55*    Recent Results (from the past 240 hour(s))  Blood culture (routine x 2)     Status: None (Preliminary result)   Collection Time: 02/18/17  3:15 PM  Result Value Ref Range Status   Specimen Description BLOOD LEFT HAND  Final   Special Requests   Final    BOTTLES DRAWN AEROBIC AND ANAEROBIC Blood Culture adequate volume   Culture NO GROWTH < 24 HOURS  Final   Report Status PENDING  Incomplete  Blood culture (routine x 2)     Status: None (Preliminary result)   Collection Time: 02/18/17  3:25 PM  Result Value Ref Range Status   Specimen Description   Final    BLOOD A reactive test result means that HIV 1 or HIV 2 antibodies have been detected in the specimen. The test result is interpreted as Preliminary Positive for HIV 1 and/or HIV 2 antibodies.   Special Requests AEROBIC BOTTLE ONLY Blood Culture adequate volume  Final   Culture NO GROWTH < 24 HOURS   Final   Report Status PENDING  Incomplete         Radiology Studies: Dg Chest 2 View  Result Date: 02/18/2017 CLINICAL DATA:  Shortness of breath EXAM: CHEST  2 VIEW COMPARISON:  11/15/2016 FINDINGS: Small left pleural effusion. Mild left basilar opacity, likely atelectasis. No pneumothorax. The heart is normal in size. Visualized osseous structures are within normal limits. IMPRESSION: Small left pleural effusion. Mild left basilar opacity, likely atelectasis. Electronically Signed   By: Julian Hy M.D.   On: 02/18/2017 15:05        Scheduled Meds: . feeding supplement (ENSURE ENLIVE)  237 mL Oral BID BM  . levothyroxine  50 mcg Oral Daily  . metoprolol tartrate  25 mg Oral BID  . mirtazapine  7.5 mg Oral QHS  . pantoprazole  40 mg Oral Daily  .  potassium chloride  40 mEq Oral BID  . raloxifene  60 mg Oral Daily  . rivaroxaban  10 mg Oral Daily   Continuous Infusions: . cefTRIAXone (ROCEPHIN)  IV       LOS: 0 days    Dron Tanna Furry, MD [facility] Pager [phone]  If 7PM-7AM, please contact night-coverage [email or web address] Password [email or web address] 02/19/2017, 1:16 PM

## 2017-02-19 NOTE — Evaluation (Signed)
Physical Therapy Evaluation Patient Details Name: Teresa Gonzalez MRN: [id number] DOB: 04/15/1922 Today's Date: 02/19/2017   History of Present Illness  Pt admitted with generalized weakness, hypokalemia, hypocalcemia PMH: uterine ca, s/p small bowel resection with chronic diarrhea, peripheral neuropathy, depression, recent UTI.  Clinical Impression  Pt presents with the above diagnosis and below deficits for therapy evaluation. Prior to admission, pt was living with her husband and received 24hr CG assistance. Pt had recently started HHPT prior to recent hospitalization. Pt requires Min A for transfers this session and gait was not attempted due to increased c/o fatigue. Pt will benefit from continued acute PT services in order to assess gait with rollator and assist with a smooth transition home.     Follow Up Recommendations Home health PT;Supervision/Assistance - 24 hour    Equipment Recommendations  3in1 (PT)    Recommendations for Other Services       Precautions / Restrictions Precautions Precautions: Fall Precaution Comments: urinary incontinence Restrictions Weight Bearing Restrictions: No      Mobility  Bed Mobility Overal bed mobility: Needs Assistance Bed Mobility: Supine to Sit     Supine to sit: Supervision     General bed mobility comments: increased time, use of rail, no physical assist  Transfers Overall transfer level: Needs assistance Equipment used: Rolling walker (2 wheeled) Transfers: Sit to/from [facility] Sit to Stand: Min assist Stand pivot transfers: Min assist       General transfer comment: assist to rise, anterior translation and to maintain balance. Transferred from bed to [facility] and [facility] to recliner  Ambulation/Gait             General Gait Details: Did not attempt this session. Pt too weak to attempt even short distance gait to sink  Science writer    Modified Rankin (Stroke Patients  Only)       Balance Overall balance assessment: Needs assistance Sitting-balance support: No upper extremity supported;Feet supported Sitting balance-Leahy Scale: Good Sitting balance - Comments: able to pull up socks without LOB   Standing balance support: Bilateral upper extremity supported;During functional activity Standing balance-Leahy Scale: Poor Standing balance comment: reliant on B UE support                             Pertinent Vitals/Pain Pain Assessment: Faces Faces Pain Scale: Hurts little more Pain Location: back Pain Descriptors / Indicators: Sharp Pain Intervention(s): Monitored during session;Repositioned;Other (comment) (placed pillow behind back on commode)    Home Living Family/patient expects to be discharged to:: Private residence Living Arrangements: Spouse/significant other Available Help at Discharge: Family;Personal care attendant;Home health;Available 24 hours/day Type of Home: House Home Access: Level entry     Home Layout: One level Home Equipment: Programme researcher, broadcasting/film/video - 4 wheels;Shower seat - built in;Hand held shower head;Grab bars - tub/shower;Grab bars - toilet      Prior Function Level of Independence: Needs assistance   Gait / Transfers Assistance Needed: Was using Rollator for gait for household distances  ADL's / Homemaking Assistance Needed: requires assistance for bathing and dressing. Has someone there to assist as needed        Hand Dominance   Dominant Hand: Right    Extremity/Trunk Assessment   Upper Extremity Assessment Upper Extremity Assessment: Defer to OT evaluation    Lower Extremity Assessment Lower Extremity Assessment: Generalized weakness    Cervical /  Trunk Assessment Cervical / Trunk Assessment: Kyphotic  Communication   Communication: HOH  Cognition Arousal/Alertness: Awake/alert Behavior During Therapy: WFL for tasks assessed/performed Overall Cognitive Status: History of cognitive  impairments - at baseline                                 General Comments: poor historian, daughter arrived during session and clarified home set up and that pt has 24 hour paid caregivers      General Comments General comments (skin integrity, edema, etc.): Daughter shows up at end of session and reports that pt does have 24hr cg assistance and is sporatic with ability to ambulate.     Exercises     Assessment/Plan    PT Assessment Patient needs continued PT services  PT Problem List Decreased strength;Decreased activity tolerance;Decreased mobility;Decreased balance       PT Treatment Interventions DME instruction;Gait training;Functional mobility training;Therapeutic activities;Therapeutic exercise;Balance training;Patient/family education    PT Goals (Current goals can be found in the Care Plan section)  Acute Rehab PT Goals Patient Stated Goal: to go home PT Goal Formulation: With patient Time For Goal Achievement: 02/26/17 Potential to Achieve Goals: Good    Frequency Min 3X/week   Barriers to discharge        Co-evaluation PT/OT/SLP Co-Evaluation/Treatment: Yes Reason for Co-Treatment: For patient/therapist safety PT goals addressed during session: Mobility/safety with mobility OT goals addressed during session: ADL's and self-care       AM-PAC PT "6 Clicks" Daily Activity  Outcome Measure Difficulty turning over in bed (including adjusting bedclothes, sheets and blankets)?: None Difficulty moving from lying on back to sitting on the side of the bed? : A Little Difficulty sitting down on and standing up from a chair with arms (e.g., wheelchair, bedside commode, etc,.)?: A Little Help needed moving to and from a bed to chair (including a wheelchair)?: A Little Help needed walking in hospital room?: A Lot Help needed climbing 3-5 steps with a railing? : A Lot 6 Click Score: 17    End of Session Equipment Utilized During Treatment: Gait  belt Activity Tolerance: Patient tolerated treatment well;Patient limited by fatigue Patient left: in chair;with call bell/phone within reach;with family/visitor present Nurse Communication: Mobility status PT Visit Diagnosis: Muscle weakness (generalized) (M62.81);Difficulty in walking, not elsewhere classified (R26.2)    Time: 8250-0370 PT Time Calculation (min) (ACUTE ONLY): 30 min   Charges:   PT Evaluation $PT Eval Moderate Complexity: 1 Procedure     PT G Codes:   PT G-Codes **NOT FOR INPATIENT CLASS** Functional Assessment Tool Used: AM-PAC 6 Clicks Basic Mobility;Clinical judgement Functional Limitation: Mobility: Walking and moving around Mobility: Walking and Moving Around Current Status (W8889): At least 40 percent but less than 60 percent impaired, limited or restricted Mobility: Walking and Moving Around Goal Status [phone]): At least 1 percent but less than 20 percent impaired, limited or restricted    Scheryl Marten PT, DPT  [phone]   Shanon Rosser 02/19/2017, 9:45 AM

## 2017-02-19 NOTE — Evaluation (Signed)
Occupational Therapy Evaluation Patient Details Name: Teresa Gonzalez MRN: [id number] DOB: May 08, 1922 Today's Date: 02/19/2017    History of Present Illness Pt admitted with generalized weakness, hypokalemia, hypocalcemia PMH: uterine ca, s/p small bowel resection with chronic diarrhea, peripheral neuropathy, depression, recent UTI.   Clinical Impression   Pt was assisted for ADL and IADL prior to admission. She ambulated with supervision and a rollator. Pt has 24 hour assist of caregivers. Pt presents with generalized weakness greater than her baseline. She requires min assist for basic transfers and was not able to ambulate this visit. Pt's daughter is without concern as family has a travel chair and pt likely to do better with her own rollator an shoes.  No OT needs.   Follow Up Recommendations  No OT follow up;Supervision/Assistance - 24 hour    Equipment Recommendations  3 in 1 bedside commode    Recommendations for Other Services       Precautions / Restrictions Precautions Precautions: Fall Precaution Comments: urinary incontinence Restrictions Weight Bearing Restrictions: No      Mobility Bed Mobility Overal bed mobility: Needs Assistance Bed Mobility: Supine to Sit     Supine to sit: Supervision     General bed mobility comments: increased time, use of rail, no physical assist  Transfers Overall transfer level: Needs assistance Equipment used: Rolling walker (2 wheeled) Transfers: Sit to/from [facility] Sit to Stand: Min assist Stand pivot transfers: Min assist       General transfer comment: assist to rise, anterior translation and to maintain balance    Balance Overall balance assessment: Needs assistance   Sitting balance-Leahy Scale: Good Sitting balance - Comments: able to pull up socks without LOB     Standing balance-Leahy Scale: Poor Standing balance comment: reliant on B UE support                           ADL  either performed or assessed with clinical judgement   ADL Overall ADL's : At baseline                                       General ADL Comments: pt with intermittent difficulty with ambulation, does better with rollator and her shoes per daughter, pt is assisted for bathing, dressing and all mobility      Vision Patient Visual Report: No change from baseline       Perception     Praxis      Pertinent Vitals/Pain Pain Assessment: Faces Faces Pain Scale: Hurts little more Pain Location: back Pain Descriptors / Indicators: Sharp Pain Intervention(s): Monitored during session;Repositioned     Hand Dominance Right   Extremity/Trunk Assessment Upper Extremity Assessment Upper Extremity Assessment: Generalized weakness (hx of peripheral neuropathy)   Lower Extremity Assessment Lower Extremity Assessment: Defer to PT evaluation   Cervical / Trunk Assessment Cervical / Trunk Assessment: Kyphotic   Communication Communication Communication: HOH   Cognition Arousal/Alertness: Awake/alert Behavior During Therapy: WFL for tasks assessed/performed Overall Cognitive Status: History of cognitive impairments - at baseline                                 General Comments: poor historian, daughter arrived during session and clarified home set up and that pt has 24 hour paid caregivers  General Comments       Exercises     Shoulder Instructions      Home Living Family/patient expects to be discharged to:: Private residence Living Arrangements: Spouse/significant other Available Help at Discharge: Family;Personal care attendant;Home health;Available 24 hours/day Type of Home: House Home Access: Level entry     Home Layout: One level     Bathroom Shower/Tub: Occupational psychologist: Standard     Home Equipment: Programme researcher, broadcasting/film/video - 4 wheels;Shower seat - built in;Hand held shower head;Grab bars - tub/shower;Grab bars -  toilet          Prior Functioning/Environment Level of Independence: Needs assistance  Gait / Transfers Assistance Needed: Was using Rollator for gait for household distances ADL's / Homemaking Assistance Needed: requires assistance for bathing and dressing. Has someone there to assist as needed            OT Problem List: Decreased strength;Decreased activity tolerance;Impaired balance (sitting and/or standing);Decreased knowledge of use of DME or AE      OT Treatment/Interventions:      OT Goals(Current goals can be found in the care plan section) Acute Rehab OT Goals Patient Stated Goal: to go home OT Goal Formulation: With patient  OT Frequency:     Barriers to D/C:            Co-evaluation PT/OT/SLP Co-Evaluation/Treatment: Yes Reason for Co-Treatment: For patient/therapist safety   OT goals addressed during session: ADL's and self-care      AM-PAC PT "6 Clicks" Daily Activity     Outcome Measure Help from another person eating meals?: None Help from another person taking care of personal grooming?: A Little Help from another person toileting, which includes using toliet, bedpan, or urinal?: A Lot Help from another person bathing (including washing, rinsing, drying)?: A Lot Help from another person to put on and taking off regular upper body clothing?: A Little Help from another person to put on and taking off regular lower body clothing?: A Lot 6 Click Score: 16   End of Session Equipment Utilized During Treatment: Rolling walker  Activity Tolerance: Patient limited by fatigue Patient left: in chair;with call bell/phone within reach;with family/visitor present  OT Visit Diagnosis: Unsteadiness on feet (R26.81);Muscle weakness (generalized) (M62.81);Other symptoms and signs involving cognitive function                Time: 8329-1916 OT Time Calculation (min): 30 min Charges:  OT General Charges $OT Visit: 1 Procedure OT Evaluation $OT Eval Moderate  Complexity: 1 Procedure G-Codes: OT G-codes **NOT FOR INPATIENT CLASS** Functional Assessment Tool Used: Clinical judgement Functional Limitation: Self care Self Care Current Status (O0600): At least 40 percent but less than 60 percent impaired, limited or restricted Self Care Goal Status (K5997): At least 40 percent but less than 60 percent impaired, limited or restricted Self Care Discharge Status [phone]): At least 40 percent but less than 60 percent impaired, limited or restricted     Malka So 02/19/2017, 9:34 AM  [phone]

## 2017-02-19 NOTE — Progress Notes (Signed)
Initial Nutrition Assessment  DOCUMENTATION CODES:   Underweight  INTERVENTION:   -D/c Ensure Enlive po BID, each supplement provides 350 kcal and 20 grams of protein -Boost Breeze po BID, each supplement provides 250 kcal and 9 grams of protein  NUTRITION DIAGNOSIS:   Inadequate oral intake related to poor appetite as evidenced by per patient/family report (variable meal intake).  GOAL:   Patient will meet greater than or equal to 90% of their needs  MONITOR:   PO intake, Supplement acceptance, Labs, Weight trends, Skin, I & O's  REASON FOR ASSESSMENT:   Malnutrition Screening Tool    ASSESSMENT:   83 y.o. female with medical history significant of uterine cancer status post radiation, status post small bowel resection with chronic diarrhea, hypothyroidism, peripheral neuropathy, depression, PAF on Xarelto, who comes into the emergency department with complaints of generalized weakness.  Pt admitted with generalized weakness.   Pt reports decreased appetite over the past few weeks. She reports "I just didn't feel like eating anything", however, made efforts to consume at least something at each meal. Pt typically consumes 3 meals per day (Breakfast: cereal OR eggs and toast OR english muffin with jelly, Lunch: soup and half sandwich, Dinner: meat, starch, and vegetable). Pt currently describes appetite as "fair"- noted 50-85% meal completion.   Pt reports weight loss, however, unable to provide further details regarding weight loss ("I just know I've lost weight). Pt shares that UBW was 120#, "but that was many years ago". Noted wt has been stable over the past 5 years.   Nutrition-Focused physical exam completed. Findings are mild fat depletion, mild muscle depletion, and no edema. Suspect muscle depletion is related to advanced age, which is common in this population. Pt is also small framed.   Pt reports she does not like the Ensure supplement as they cause her nausea.  Discussed importance of good meal and supplement intake to promote healing. Pt amenable to Boost Breeze supplement.   Labs reviewed.   Diet Order:  Diet regular Room service appropriate? Yes; Fluid consistency: Thin  Skin:  Reviewed, no issues  Last BM:  02/18/17  Height:   Ht Readings from Last 1 Encounters:  11/15/16 5\' 4"  (1.626 m)    Weight:   Wt Readings from Last 1 Encounters:  02/18/17 106 lb (48.1 kg)    Ideal Body Weight:  54.5 kg  BMI:  Body mass index is 18.19 kg/m.  Estimated Nutritional Needs:   Kcal:  1200-1400  Protein:  55-70 grams  Fluid:  1.2-1.4 L  EDUCATION NEEDS:   Education needs addressed  Arizbeth Cawthorn A. Jimmye Norman, RD, LDN, CDE Pager: [phone] After hours Pager: [phone]

## 2017-02-20 DIAGNOSIS — E876 Hypokalemia: Secondary | ICD-10-CM | POA: Diagnosis not present

## 2017-02-20 DIAGNOSIS — E890 Postprocedural hypothyroidism: Secondary | ICD-10-CM | POA: Diagnosis not present

## 2017-02-20 DIAGNOSIS — F329 Major depressive disorder, single episode, unspecified: Secondary | ICD-10-CM | POA: Diagnosis not present

## 2017-02-20 DIAGNOSIS — Z66 Do not resuscitate: Secondary | ICD-10-CM | POA: Diagnosis not present

## 2017-02-20 DIAGNOSIS — G629 Polyneuropathy, unspecified: Secondary | ICD-10-CM | POA: Diagnosis not present

## 2017-02-20 DIAGNOSIS — I480 Paroxysmal atrial fibrillation: Secondary | ICD-10-CM | POA: Diagnosis not present

## 2017-02-20 DIAGNOSIS — N3000 Acute cystitis without hematuria: Secondary | ICD-10-CM | POA: Diagnosis not present

## 2017-02-20 DIAGNOSIS — Z8542 Personal history of malignant neoplasm of other parts of uterus: Secondary | ICD-10-CM | POA: Diagnosis not present

## 2017-02-20 DIAGNOSIS — R531 Weakness: Secondary | ICD-10-CM | POA: Diagnosis not present

## 2017-02-20 LAB — BASIC METABOLIC PANEL
ANION GAP: 7 (ref 5–15)
BUN: <5 mg/dL — ABNORMAL LOW (ref 6–20)
CO2: 24 mmol/L (ref 22–32)
Calcium: 6.8 mg/dL — ABNORMAL LOW (ref 8.9–10.3)
Chloride: 107 mmol/L (ref 101–111)
Creatinine, Ser: 0.61 mg/dL (ref 0.44–1.00)
GFR calc Af Amer: >60 mL/min (ref >60)
GLUCOSE: 102 mg/dL — AB (ref 65–99)
POTASSIUM: 3.5 mmol/L (ref 3.5–5.1)
Sodium: 138 mmol/L (ref 135–145)

## 2017-02-20 LAB — PARATHYROID HORMONE, INTACT (NO CA): PTH: 103 pg/mL — AB (ref 15–65)

## 2017-02-20 LAB — MAGNESIUM: MAGNESIUM: 2 mg/dL (ref 1.7–2.4)

## 2017-02-20 LAB — CALCIUM, IONIZED: CALCIUM, IONIZED, SERUM: 3 mg/dL — AB (ref 4.5–5.6)

## 2017-02-20 MED ORDER — CEPHALEXIN 500 MG PO CAPS: 500.0000 mg | ORAL_CAPSULE | Freq: Two times a day (BID) | ORAL | 0 refills | Status: AC

## 2017-02-20 MED ORDER — POTASSIUM CHLORIDE CRYS ER 20 MEQ PO TBCR: 20.0000 meq | EXTENDED_RELEASE_TABLET | Freq: Every day | ORAL | 0 refills | Status: DC

## 2017-02-20 MED ORDER — ACETAMINOPHEN 325 MG PO TABS
650.0000 mg | ORAL_TABLET | Freq: Four times a day (QID) | ORAL | Status: DC | PRN
  Administered 2017-02-20: 650 mg via ORAL
  Filled 2017-02-20: qty 2

## 2017-02-20 NOTE — Care Management Note (Signed)
Case Management Note  Patient Details  Name: Teresa Gonzalez MRN: [id number] Date of Birth: Jun 30, 1922  Subjective/Objective:                  Generalized weakness Action/Plan: Discharge planning Expected Discharge Date:  02/20/17               Expected Discharge Plan:  [place]  In-House Referral:     Discharge planning Services  CM Consult  Post Acute Care Choice:  Home Health Choice offered to:  Patient  DME Arranged:  N/A DME Agency:  NA  HH Arranged:  RN, PT, OT, Nurse's Aide [place] Agency:  [place]  Status of Service:  Completed, signed off  If discussed at [place] of Stay Meetings, dates discussed:    Additional Comments: Cm received call from [place], to please resume [facility] services with [facility].  CM called [facility] rep, Jermaine to please schedule HHPT/OT/RN/Aide.  NO other CM needs were communicated. Dellie Catholic, RN 02/20/2017, 12:27 PM

## 2017-02-20 NOTE — Progress Notes (Signed)
Patient discharge teaching given, including activity, diet, follow-up appoints, and medications. Patient verbalized understanding of all discharge instructions. IV access was d/c'd. Vitals are stable. Skin is intact except as charted in most recent assessments. Pt to be escorted out by NT, to be driven home by family.  Lowen Mansouri, MBA, BSN, RN 

## 2017-02-20 NOTE — Discharge Summary (Signed)
Physician Discharge Summary  Teresa Gonzalez:[id number] DOB: 1922-04-16 DOA: 02/18/2017  PCP: Shon Baton, MD  Admit date: 02/18/2017 Discharge date: 02/20/2017  Admitted From:home Disposition:home  Recommendations for Outpatient Follow-up:  1. Follow up with PCP in 1-2 weeks 2. Please obtain BMP in one week 3. Please follow up the result of vitamin D level and PTH with your PCP.   Home Health:yes Equipment/Devices:3 in 1 Discharge Condition:stable CODE STATUS:dnr Diet recommendation:heart healthy diet  Brief/Interim Summary: 83 y.o.femalewith medical history significant of uterine cancer status post radiation, status post small bowel resection with chronic diarrhea, hypothyroidism, peripheral neuropathy, depression, PAF on Xarelto, who comes into the emergency department with complaints of generalized weakness.  #  Generalized weakness likely due to electrolyte imbalance and possible UTI and deconditioning. Likely improved. Electrolytes imbalance corrected. Invalid by PTOT. Patient will be discharged home with home care services.  #Hypokalemia and hypomagnesemia, hypocalcemia likely due to increased GI loss. Patient reported she has chronic diarrhea. Corrected electrolytes in the hospital. Discharge with oral potassium chloride. She is already on calcium and vitamin D. Recommended to repeat lab in a week with PCP.  #Acute cystitis without hematuria: Treated with IV ceftriaxone with clinical improvement. Urine culture growing gram-negative rod, sensitivity is not back. Discharging with oral Keflex. Patient without any allergy with ceftriaxone. Tolerated well.  #Paroxysmal atrial fibrillation: Continue xarelto  #Essential hypertension: Continue Lopressor  #Chronic diarrhea status post small bowel resection: Continue Imodium. Continue supportive care.  #Hypothyroidism: Continue Synthroid. TSH level acceptable.  Discharge Diagnoses:  Principal Problem:   Generalized  weakness Active Problems:   Polyneuropathy in other diseases classified elsewhere [facility])   Atrial fibrillation (HCC)   Chronic diarrhea   UTI (urinary tract infection)   Hypokalemia   Hypocalcemia   Hypomagnesemia   Hypoglycemia    Discharge Instructions  Discharge Instructions    Call MD for:  difficulty breathing, headache or visual disturbances    Complete by:  As directed    Call MD for:  extreme fatigue    Complete by:  As directed    Call MD for:  hives    Complete by:  As directed    Call MD for:  persistant dizziness or light-headedness    Complete by:  As directed    Call MD for:  persistant nausea and vomiting    Complete by:  As directed    Call MD for:  severe uncontrolled pain    Complete by:  As directed    Call MD for:  temperature >100.4    Complete by:  As directed    Diet - low sodium heart healthy    Complete by:  As directed    Discharge instructions    Complete by:  As directed    Please check lab (BMP, magnesium level) in one week with your PCP   Increase activity slowly    Complete by:  As directed      Allergies as of 02/20/2017      Reactions   Codeine Itching   Penicillins Swelling         Medication List    TAKE these medications   CALTRATE 600 PO Take 600 mg by mouth 3 (three) times daily.   cephALEXin 500 MG capsule Commonly known as:  KEFLEX Take 1 capsule (500 mg total) by mouth 2 (two) times daily.   gabapentin 100 MG capsule Commonly known as:  NEURONTIN Take 100 mg by mouth 2 (two) times daily as needed (nerve  pain).   loperamide 2 MG capsule Commonly known as:  IMODIUM Take 1 capsule (2 mg total) by mouth 5 (five) times daily as needed for diarrhea or loose stools.   LORazepam 0.5 MG tablet Commonly known as:  ATIVAN Take 0.5 mg by mouth daily as needed for anxiety.   meclizine 12.5 MG tablet Commonly known as:  ANTIVERT Take 12.5 mg by mouth 3 (three) times daily as needed for dizziness.   methocarbamol 500 MG  tablet Commonly known as:  ROBAXIN Take 500 mg by mouth every 8 (eight) hours as needed for muscle spasms.   metoprolol tartrate 25 MG tablet Commonly known as:  LOPRESSOR Take 1 tablet (25 mg total) by mouth 2 (two) times daily.   mirtazapine 15 MG tablet Commonly known as:  REMERON Take 7.5 mg by mouth at bedtime.   multivitamin with minerals tablet Take 1 tablet by mouth daily.   omeprazole 40 MG capsule Commonly known as:  PRILOSEC Take 40 mg by mouth daily.   potassium chloride SA 20 MEQ tablet Commonly known as:  K-DUR,KLOR-CON Take 1 tablet (20 mEq total) by mouth daily.   raloxifene 60 MG tablet Commonly known as:  EVISTA Take 60 mg by mouth daily.   rivaroxaban 10 MG Tabs tablet Commonly known as:  XARELTO Take 10 mg by mouth daily.   SYNTHROID 50 MCG tablet Generic drug:  levothyroxine Take 50 mcg by mouth daily.   VITAMIN B1-B12 IJ Inject 1 mL into the skin every 21 ( twenty-one) days.   Vitamin D (Ergocalciferol) 50000 units Caps capsule Commonly known as:  DRISDOL Take 50,000 Units by mouth See admin instructions. Takes on Monday, Wednesday, Friday and Sunday   Vitamin D3 1000 units Caps Take 1,000 Units by mouth daily.            Durable Medical Equipment        Start     Ordered   02/20/17 1111  DME 3-in-1  Once     05 /13/18 1113     Follow-up Information    Shon Baton, MD. Schedule an appointment as soon as possible for a visit in 1 week(s).   Specialty:  Internal Medicine Contact information: [place] [address] [phone]          Allergies  Allergen Reactions  . Codeine Itching  . Penicillins Swelling        Consultations: None  Procedures/Studies: None  Subjective: Patient was seen and examined at bedside. Denied headache, dizziness, nausea, vomiting, chest, shortness of breath. Wanted to go home.  Discharge Exam: Vitals:   02/20/17 0523 02/20/17 1005  BP: (!) 149/71 (!) 132/56  Pulse:  84 77  Resp: 17 16  Temp: 97.7 F (36.5 C) 98.7 F (37.1 C)   Vitals:   02/19/17 2030 02/19/17 2111 02/20/17 0523 02/20/17 1005  BP:  (!) 147/67 (!) 149/71 (!) 132/56  Pulse:  80 84 77  Resp:  17 17 16   Temp:  97.7 F (36.5 C) 97.7 F (36.5 C) 98.7 F (37.1 C)  TempSrc:  Oral Oral Oral  SpO2:  97% 93% 97%  Weight:  50.3 kg (111 lb)    Height: 5\' 3"  (1.6 m)       General: Pt is alert, awake, not in acute distress Cardiovascular: RRR, S1/S2 +, no rubs, no gallops Respiratory: CTA bilaterally, no wheezing, no rhonchi Abdominal: Soft, NT, ND, bowel sounds + Extremities: no edema, no cyanosis    The results of  significant diagnostics from this hospitalization (including imaging, microbiology, ancillary and laboratory) are listed below for reference.     Microbiology: Recent Results (from the past 240 hour(s))  Culture, Urine     Status: Abnormal (Preliminary result)   Collection Time: 02/18/17 12:17 PM  Result Value Ref Range Status   Specimen Description URINE, RANDOM  Final   Special Requests NONE  Final   Culture >=100,000 COLONIES/mL GRAM NEGATIVE RODS (A)  Final   Report Status PENDING  Incomplete  Blood culture (routine x 2)     Status: None (Preliminary result)   Collection Time: 02/18/17  3:15 PM  Result Value Ref Range Status   Specimen Description BLOOD LEFT HAND  Final   Special Requests   Final    BOTTLES DRAWN AEROBIC AND ANAEROBIC Blood Culture adequate volume   Culture NO GROWTH 2 DAYS  Final   Report Status PENDING  Incomplete  Blood culture (routine x 2)     Status: None (Preliminary result)   Collection Time: 02/18/17  3:25 PM  Result Value Ref Range Status   Specimen Description   Final    BLOOD A reactive test result means that HIV 1 or HIV 2 antibodies have been detected in the specimen. The test result is interpreted as Preliminary Positive for HIV 1 and/or HIV 2 antibodies.   Special Requests AEROBIC BOTTLE ONLY Blood Culture adequate volume   Final   Culture NO GROWTH 2 DAYS  Final   Report Status PENDING  Incomplete     Labs: BNP (last 3 results) No results for input(s): BNP in the last 8760 hours. Basic Metabolic Panel:  Recent Labs Lab 02/18/17 1100 02/18/17 1512 02/18/17 1839 02/19/17 0633 02/19/17 1519 02/20/17 0501  NA 139 142  --  143 136 138  K 3.1* 2.7*  --  3.0* 3.3* 3.5  CL 102 110  --  109 106 107  CO2 22 20*  --  21* 19* 24  GLUCOSE 119* 92  --  87 144* 102*  BUN 9 8  --  <5* 6 <5*  CREATININE 0.83 0.71  --  0.61 0.73 0.61  CALCIUM 5.7* 5.5*  --  6.0* 6.6* 6.8*  MG  --  0.3*  --  1.5*  --  2.0  PHOS  --   --  3.6  --   --   --    Liver Function Tests:  Recent Labs Lab 02/18/17 1100 02/19/17 0633  AST 35 41  ALT 16 19  ALKPHOS 97 97  BILITOT 0.7 0.7  PROT 5.9* 5.5*  ALBUMIN 2.9* 2.5*    Recent Labs Lab 02/18/17 1100  LIPASE 19   No results for input(s): AMMONIA in the last 168 hours. CBC:  Recent Labs Lab 02/18/17 1100 02/19/17 0633  WBC 10.8* 9.5  HGB 12.8 12.0  HCT 38.2 35.1*  MCV 88.8 88.0  PLT 446* 399   Cardiac Enzymes: No results for input(s): CKTOTAL, CKMB, CKMBINDEX, TROPONINI in the last 168 hours. BNP: Invalid input(s): POCBNP CBG: No results for input(s): GLUCAP in the last 168 hours. D-Dimer No results for input(s): DDIMER in the last 72 hours. Hgb A1c No results for input(s): HGBA1C in the last 72 hours. Lipid Profile No results for input(s): CHOL, HDL, LDLCALC, TRIG, CHOLHDL, LDLDIRECT in the last 72 hours. Thyroid function studies  Recent Labs  02/19/17 1519  TSH 1.786   Anemia work up No results for input(s): VITAMINB12, FOLATE, FERRITIN, TIBC, IRON, RETICCTPCT in the last  72 hours. Urinalysis    Component Value Date/Time   COLORURINE YELLOW 02/18/2017 [place] 02/18/2017 1217   LABSPEC 1.003 (L) 02/18/2017 1217   PHURINE 6.0 02/18/2017 1217   GLUCOSEU NEGATIVE 02/18/2017 1217   HGBUR NEGATIVE 02/18/2017 1217    BILIRUBINUR NEGATIVE 02/18/2017 1217   KETONESUR NEGATIVE 02/18/2017 1217   PROTEINUR NEGATIVE 02/18/2017 1217   UROBILINOGEN 0.2 08/01/2015 0125   NITRITE NEGATIVE 02/18/2017 1217   LEUKOCYTESUR MODERATE (A) 02/18/2017 1217   Sepsis Labs Invalid input(s): PROCALCITONIN,  WBC,  LACTICIDVEN Microbiology Recent Results (from the past 240 hour(s))  Culture, Urine     Status: Abnormal (Preliminary result)   Collection Time: 02/18/17 12:17 PM  Result Value Ref Range Status   Specimen Description URINE, RANDOM  Final   Special Requests NONE  Final   Culture >=100,000 COLONIES/mL GRAM NEGATIVE RODS (A)  Final   Report Status PENDING  Incomplete  Blood culture (routine x 2)     Status: None (Preliminary result)   Collection Time: 02/18/17  3:15 PM  Result Value Ref Range Status   Specimen Description BLOOD LEFT HAND  Final   Special Requests   Final    BOTTLES DRAWN AEROBIC AND ANAEROBIC Blood Culture adequate volume   Culture NO GROWTH 2 DAYS  Final   Report Status PENDING  Incomplete  Blood culture (routine x 2)     Status: None (Preliminary result)   Collection Time: 02/18/17  3:25 PM  Result Value Ref Range Status   Specimen Description   Final    BLOOD A reactive test result means that HIV 1 or HIV 2 antibodies have been detected in the specimen. The test result is interpreted as Preliminary Positive for HIV 1 and/or HIV 2 antibodies.   Special Requests AEROBIC BOTTLE ONLY Blood Culture adequate volume  Final   Culture NO GROWTH 2 DAYS  Final   Report Status PENDING  Incomplete     Time coordinating discharge: 28 minutes  SIGNED:   Rosita Fire, MD  [facility] 02/20/2017, 11:14 AM  If 7PM-7AM, please contact night-coverage [email or web address] Password [email or web address]

## 2017-02-21 LAB — URINE CULTURE

## 2017-02-21 LAB — VITAMIN D 25 HYDROXY (VIT D DEFICIENCY, FRACTURES): VIT D 25 HYDROXY: 21.1 ng/mL — AB (ref 30.0–100.0)

## 2017-02-21 LAB — CALCIUM, IONIZED: CALCIUM, IONIZED, SERUM: 4.2 mg/dL — AB (ref 4.5–5.6)

## 2017-02-21 MED ORDER — NITROFURANTOIN MONOHYD MACRO 100 MG PO CAPS: 100.0000 mg | ORAL_CAPSULE | Freq: Two times a day (BID) | ORAL | 0 refills | Status: AC

## 2017-02-21 NOTE — Progress Notes (Signed)
Late entry:  Urine culture growing E coli resistant to keflex. I called patient and spoke with her daughter regarding the test result. Advised to dc keflex and to take new medication which was sent to her pharmacy. I sent prescription for macrobid for 3 days. I called and spoke with pharmacist at pt's [facility] at [facility].

## 2017-02-22 DIAGNOSIS — M810 Age-related osteoporosis without current pathological fracture: Secondary | ICD-10-CM | POA: Diagnosis not present

## 2017-02-22 DIAGNOSIS — I4891 Unspecified atrial fibrillation: Secondary | ICD-10-CM | POA: Diagnosis not present

## 2017-02-22 DIAGNOSIS — M5440 Lumbago with sciatica, unspecified side: Secondary | ICD-10-CM | POA: Diagnosis not present

## 2017-02-22 DIAGNOSIS — I447 Left bundle-branch block, unspecified: Secondary | ICD-10-CM | POA: Diagnosis not present

## 2017-02-22 DIAGNOSIS — E559 Vitamin D deficiency, unspecified: Secondary | ICD-10-CM | POA: Diagnosis not present

## 2017-02-22 DIAGNOSIS — E782 Mixed hyperlipidemia: Secondary | ICD-10-CM | POA: Diagnosis not present

## 2017-02-22 DIAGNOSIS — I10 Essential (primary) hypertension: Secondary | ICD-10-CM | POA: Diagnosis not present

## 2017-02-22 DIAGNOSIS — G629 Polyneuropathy, unspecified: Secondary | ICD-10-CM | POA: Diagnosis not present

## 2017-02-22 DIAGNOSIS — R5383 Other fatigue: Secondary | ICD-10-CM | POA: Diagnosis not present

## 2017-02-22 DIAGNOSIS — D510 Vitamin B12 deficiency anemia due to intrinsic factor deficiency: Secondary | ICD-10-CM | POA: Diagnosis not present

## 2017-02-23 LAB — CULTURE, BLOOD (ROUTINE X 2)
Culture: NO GROWTH
Culture: NO GROWTH
Special Requests: ADEQUATE
Special Requests: ADEQUATE

## 2017-02-24 DIAGNOSIS — M810 Age-related osteoporosis without current pathological fracture: Secondary | ICD-10-CM | POA: Diagnosis not present

## 2017-02-24 DIAGNOSIS — E782 Mixed hyperlipidemia: Secondary | ICD-10-CM | POA: Diagnosis not present

## 2017-02-24 DIAGNOSIS — G629 Polyneuropathy, unspecified: Secondary | ICD-10-CM | POA: Diagnosis not present

## 2017-02-24 DIAGNOSIS — I447 Left bundle-branch block, unspecified: Secondary | ICD-10-CM | POA: Diagnosis not present

## 2017-02-24 DIAGNOSIS — I4891 Unspecified atrial fibrillation: Secondary | ICD-10-CM | POA: Diagnosis not present

## 2017-02-24 DIAGNOSIS — I10 Essential (primary) hypertension: Secondary | ICD-10-CM | POA: Diagnosis not present

## 2017-02-24 DIAGNOSIS — D510 Vitamin B12 deficiency anemia due to intrinsic factor deficiency: Secondary | ICD-10-CM | POA: Diagnosis not present

## 2017-02-24 DIAGNOSIS — M5440 Lumbago with sciatica, unspecified side: Secondary | ICD-10-CM | POA: Diagnosis not present

## 2017-02-24 DIAGNOSIS — E559 Vitamin D deficiency, unspecified: Secondary | ICD-10-CM | POA: Diagnosis not present

## 2017-02-25 DIAGNOSIS — E559 Vitamin D deficiency, unspecified: Secondary | ICD-10-CM | POA: Diagnosis not present

## 2017-02-25 DIAGNOSIS — D510 Vitamin B12 deficiency anemia due to intrinsic factor deficiency: Secondary | ICD-10-CM | POA: Diagnosis not present

## 2017-02-25 DIAGNOSIS — I4891 Unspecified atrial fibrillation: Secondary | ICD-10-CM | POA: Diagnosis not present

## 2017-02-25 DIAGNOSIS — I10 Essential (primary) hypertension: Secondary | ICD-10-CM | POA: Diagnosis not present

## 2017-02-25 DIAGNOSIS — M810 Age-related osteoporosis without current pathological fracture: Secondary | ICD-10-CM | POA: Diagnosis not present

## 2017-02-25 DIAGNOSIS — M5440 Lumbago with sciatica, unspecified side: Secondary | ICD-10-CM | POA: Diagnosis not present

## 2017-02-25 DIAGNOSIS — G629 Polyneuropathy, unspecified: Secondary | ICD-10-CM | POA: Diagnosis not present

## 2017-02-25 DIAGNOSIS — E782 Mixed hyperlipidemia: Secondary | ICD-10-CM | POA: Diagnosis not present

## 2017-02-25 DIAGNOSIS — I447 Left bundle-branch block, unspecified: Secondary | ICD-10-CM | POA: Diagnosis not present

## 2017-02-28 DIAGNOSIS — I4891 Unspecified atrial fibrillation: Secondary | ICD-10-CM | POA: Diagnosis not present

## 2017-02-28 DIAGNOSIS — I10 Essential (primary) hypertension: Secondary | ICD-10-CM | POA: Diagnosis not present

## 2017-02-28 DIAGNOSIS — M5440 Lumbago with sciatica, unspecified side: Secondary | ICD-10-CM | POA: Diagnosis not present

## 2017-02-28 DIAGNOSIS — E559 Vitamin D deficiency, unspecified: Secondary | ICD-10-CM | POA: Diagnosis not present

## 2017-02-28 DIAGNOSIS — D510 Vitamin B12 deficiency anemia due to intrinsic factor deficiency: Secondary | ICD-10-CM | POA: Diagnosis not present

## 2017-02-28 DIAGNOSIS — G629 Polyneuropathy, unspecified: Secondary | ICD-10-CM | POA: Diagnosis not present

## 2017-02-28 DIAGNOSIS — E782 Mixed hyperlipidemia: Secondary | ICD-10-CM | POA: Diagnosis not present

## 2017-02-28 DIAGNOSIS — M810 Age-related osteoporosis without current pathological fracture: Secondary | ICD-10-CM | POA: Diagnosis not present

## 2017-02-28 DIAGNOSIS — I447 Left bundle-branch block, unspecified: Secondary | ICD-10-CM | POA: Diagnosis not present

## 2017-03-01 DIAGNOSIS — M810 Age-related osteoporosis without current pathological fracture: Secondary | ICD-10-CM | POA: Diagnosis not present

## 2017-03-01 DIAGNOSIS — I10 Essential (primary) hypertension: Secondary | ICD-10-CM | POA: Diagnosis not present

## 2017-03-01 DIAGNOSIS — I4891 Unspecified atrial fibrillation: Secondary | ICD-10-CM | POA: Diagnosis not present

## 2017-03-01 DIAGNOSIS — E782 Mixed hyperlipidemia: Secondary | ICD-10-CM | POA: Diagnosis not present

## 2017-03-01 DIAGNOSIS — E559 Vitamin D deficiency, unspecified: Secondary | ICD-10-CM | POA: Diagnosis not present

## 2017-03-01 DIAGNOSIS — D510 Vitamin B12 deficiency anemia due to intrinsic factor deficiency: Secondary | ICD-10-CM | POA: Diagnosis not present

## 2017-03-01 DIAGNOSIS — M5440 Lumbago with sciatica, unspecified side: Secondary | ICD-10-CM | POA: Diagnosis not present

## 2017-03-01 DIAGNOSIS — G629 Polyneuropathy, unspecified: Secondary | ICD-10-CM | POA: Diagnosis not present

## 2017-03-01 DIAGNOSIS — I447 Left bundle-branch block, unspecified: Secondary | ICD-10-CM | POA: Diagnosis not present

## 2017-03-03 DIAGNOSIS — I447 Left bundle-branch block, unspecified: Secondary | ICD-10-CM | POA: Diagnosis not present

## 2017-03-03 DIAGNOSIS — I4891 Unspecified atrial fibrillation: Secondary | ICD-10-CM | POA: Diagnosis not present

## 2017-03-03 DIAGNOSIS — M810 Age-related osteoporosis without current pathological fracture: Secondary | ICD-10-CM | POA: Diagnosis not present

## 2017-03-03 DIAGNOSIS — D510 Vitamin B12 deficiency anemia due to intrinsic factor deficiency: Secondary | ICD-10-CM | POA: Diagnosis not present

## 2017-03-03 DIAGNOSIS — E559 Vitamin D deficiency, unspecified: Secondary | ICD-10-CM | POA: Diagnosis not present

## 2017-03-03 DIAGNOSIS — E782 Mixed hyperlipidemia: Secondary | ICD-10-CM | POA: Diagnosis not present

## 2017-03-03 DIAGNOSIS — M5440 Lumbago with sciatica, unspecified side: Secondary | ICD-10-CM | POA: Diagnosis not present

## 2017-03-03 DIAGNOSIS — G629 Polyneuropathy, unspecified: Secondary | ICD-10-CM | POA: Diagnosis not present

## 2017-03-03 DIAGNOSIS — I10 Essential (primary) hypertension: Secondary | ICD-10-CM | POA: Diagnosis not present

## 2017-03-07 DIAGNOSIS — E782 Mixed hyperlipidemia: Secondary | ICD-10-CM | POA: Diagnosis not present

## 2017-03-07 DIAGNOSIS — M810 Age-related osteoporosis without current pathological fracture: Secondary | ICD-10-CM | POA: Diagnosis not present

## 2017-03-07 DIAGNOSIS — M5440 Lumbago with sciatica, unspecified side: Secondary | ICD-10-CM | POA: Diagnosis not present

## 2017-03-07 DIAGNOSIS — I447 Left bundle-branch block, unspecified: Secondary | ICD-10-CM | POA: Diagnosis not present

## 2017-03-07 DIAGNOSIS — D510 Vitamin B12 deficiency anemia due to intrinsic factor deficiency: Secondary | ICD-10-CM | POA: Diagnosis not present

## 2017-03-07 DIAGNOSIS — E559 Vitamin D deficiency, unspecified: Secondary | ICD-10-CM | POA: Diagnosis not present

## 2017-03-07 DIAGNOSIS — I10 Essential (primary) hypertension: Secondary | ICD-10-CM | POA: Diagnosis not present

## 2017-03-07 DIAGNOSIS — G629 Polyneuropathy, unspecified: Secondary | ICD-10-CM | POA: Diagnosis not present

## 2017-03-07 DIAGNOSIS — I4891 Unspecified atrial fibrillation: Secondary | ICD-10-CM | POA: Diagnosis not present

## 2017-03-08 DIAGNOSIS — I447 Left bundle-branch block, unspecified: Secondary | ICD-10-CM | POA: Diagnosis not present

## 2017-03-08 DIAGNOSIS — I10 Essential (primary) hypertension: Secondary | ICD-10-CM | POA: Diagnosis not present

## 2017-03-08 DIAGNOSIS — M810 Age-related osteoporosis without current pathological fracture: Secondary | ICD-10-CM | POA: Diagnosis not present

## 2017-03-08 DIAGNOSIS — E782 Mixed hyperlipidemia: Secondary | ICD-10-CM | POA: Diagnosis not present

## 2017-03-08 DIAGNOSIS — M5440 Lumbago with sciatica, unspecified side: Secondary | ICD-10-CM | POA: Diagnosis not present

## 2017-03-08 DIAGNOSIS — G629 Polyneuropathy, unspecified: Secondary | ICD-10-CM | POA: Diagnosis not present

## 2017-03-08 DIAGNOSIS — D510 Vitamin B12 deficiency anemia due to intrinsic factor deficiency: Secondary | ICD-10-CM | POA: Diagnosis not present

## 2017-03-08 DIAGNOSIS — I4891 Unspecified atrial fibrillation: Secondary | ICD-10-CM | POA: Diagnosis not present

## 2017-03-08 DIAGNOSIS — E559 Vitamin D deficiency, unspecified: Secondary | ICD-10-CM | POA: Diagnosis not present

## 2017-03-10 DIAGNOSIS — I4891 Unspecified atrial fibrillation: Secondary | ICD-10-CM | POA: Diagnosis not present

## 2017-03-10 DIAGNOSIS — I447 Left bundle-branch block, unspecified: Secondary | ICD-10-CM | POA: Diagnosis not present

## 2017-03-10 DIAGNOSIS — E782 Mixed hyperlipidemia: Secondary | ICD-10-CM | POA: Diagnosis not present

## 2017-03-10 DIAGNOSIS — E559 Vitamin D deficiency, unspecified: Secondary | ICD-10-CM | POA: Diagnosis not present

## 2017-03-10 DIAGNOSIS — I10 Essential (primary) hypertension: Secondary | ICD-10-CM | POA: Diagnosis not present

## 2017-03-10 DIAGNOSIS — D510 Vitamin B12 deficiency anemia due to intrinsic factor deficiency: Secondary | ICD-10-CM | POA: Diagnosis not present

## 2017-03-10 DIAGNOSIS — M5440 Lumbago with sciatica, unspecified side: Secondary | ICD-10-CM | POA: Diagnosis not present

## 2017-03-10 DIAGNOSIS — M810 Age-related osteoporosis without current pathological fracture: Secondary | ICD-10-CM | POA: Diagnosis not present

## 2017-03-10 DIAGNOSIS — G629 Polyneuropathy, unspecified: Secondary | ICD-10-CM | POA: Diagnosis not present

## 2017-03-15 DIAGNOSIS — G629 Polyneuropathy, unspecified: Secondary | ICD-10-CM | POA: Diagnosis not present

## 2017-03-15 DIAGNOSIS — E782 Mixed hyperlipidemia: Secondary | ICD-10-CM | POA: Diagnosis not present

## 2017-03-15 DIAGNOSIS — M5440 Lumbago with sciatica, unspecified side: Secondary | ICD-10-CM | POA: Diagnosis not present

## 2017-03-15 DIAGNOSIS — I10 Essential (primary) hypertension: Secondary | ICD-10-CM | POA: Diagnosis not present

## 2017-03-15 DIAGNOSIS — M810 Age-related osteoporosis without current pathological fracture: Secondary | ICD-10-CM | POA: Diagnosis not present

## 2017-03-15 DIAGNOSIS — D510 Vitamin B12 deficiency anemia due to intrinsic factor deficiency: Secondary | ICD-10-CM | POA: Diagnosis not present

## 2017-03-15 DIAGNOSIS — I447 Left bundle-branch block, unspecified: Secondary | ICD-10-CM | POA: Diagnosis not present

## 2017-03-15 DIAGNOSIS — I4891 Unspecified atrial fibrillation: Secondary | ICD-10-CM | POA: Diagnosis not present

## 2017-03-15 DIAGNOSIS — E559 Vitamin D deficiency, unspecified: Secondary | ICD-10-CM | POA: Diagnosis not present

## 2017-03-16 DIAGNOSIS — I4891 Unspecified atrial fibrillation: Secondary | ICD-10-CM | POA: Diagnosis not present

## 2017-03-16 DIAGNOSIS — I447 Left bundle-branch block, unspecified: Secondary | ICD-10-CM | POA: Diagnosis not present

## 2017-03-16 DIAGNOSIS — I10 Essential (primary) hypertension: Secondary | ICD-10-CM | POA: Diagnosis not present

## 2017-03-16 DIAGNOSIS — M810 Age-related osteoporosis without current pathological fracture: Secondary | ICD-10-CM | POA: Diagnosis not present

## 2017-03-16 DIAGNOSIS — G629 Polyneuropathy, unspecified: Secondary | ICD-10-CM | POA: Diagnosis not present

## 2017-03-16 DIAGNOSIS — M5440 Lumbago with sciatica, unspecified side: Secondary | ICD-10-CM | POA: Diagnosis not present

## 2017-03-16 DIAGNOSIS — E782 Mixed hyperlipidemia: Secondary | ICD-10-CM | POA: Diagnosis not present

## 2017-03-16 DIAGNOSIS — E559 Vitamin D deficiency, unspecified: Secondary | ICD-10-CM | POA: Diagnosis not present

## 2017-03-16 DIAGNOSIS — D510 Vitamin B12 deficiency anemia due to intrinsic factor deficiency: Secondary | ICD-10-CM | POA: Diagnosis not present

## 2017-03-17 DIAGNOSIS — I10 Essential (primary) hypertension: Secondary | ICD-10-CM | POA: Diagnosis not present

## 2017-03-17 DIAGNOSIS — D510 Vitamin B12 deficiency anemia due to intrinsic factor deficiency: Secondary | ICD-10-CM | POA: Diagnosis not present

## 2017-03-17 DIAGNOSIS — G629 Polyneuropathy, unspecified: Secondary | ICD-10-CM | POA: Diagnosis not present

## 2017-03-17 DIAGNOSIS — I447 Left bundle-branch block, unspecified: Secondary | ICD-10-CM | POA: Diagnosis not present

## 2017-03-17 DIAGNOSIS — E559 Vitamin D deficiency, unspecified: Secondary | ICD-10-CM | POA: Diagnosis not present

## 2017-03-17 DIAGNOSIS — I4891 Unspecified atrial fibrillation: Secondary | ICD-10-CM | POA: Diagnosis not present

## 2017-03-17 DIAGNOSIS — M5440 Lumbago with sciatica, unspecified side: Secondary | ICD-10-CM | POA: Diagnosis not present

## 2017-03-17 DIAGNOSIS — E782 Mixed hyperlipidemia: Secondary | ICD-10-CM | POA: Diagnosis not present

## 2017-03-17 DIAGNOSIS — M810 Age-related osteoporosis without current pathological fracture: Secondary | ICD-10-CM | POA: Diagnosis not present

## 2017-03-21 DIAGNOSIS — M5440 Lumbago with sciatica, unspecified side: Secondary | ICD-10-CM | POA: Diagnosis not present

## 2017-03-21 DIAGNOSIS — E038 Other specified hypothyroidism: Secondary | ICD-10-CM | POA: Diagnosis not present

## 2017-03-21 DIAGNOSIS — I4891 Unspecified atrial fibrillation: Secondary | ICD-10-CM | POA: Diagnosis not present

## 2017-03-21 DIAGNOSIS — E782 Mixed hyperlipidemia: Secondary | ICD-10-CM | POA: Diagnosis not present

## 2017-03-21 DIAGNOSIS — I447 Left bundle-branch block, unspecified: Secondary | ICD-10-CM | POA: Diagnosis not present

## 2017-03-21 DIAGNOSIS — D510 Vitamin B12 deficiency anemia due to intrinsic factor deficiency: Secondary | ICD-10-CM | POA: Diagnosis not present

## 2017-03-21 DIAGNOSIS — I480 Paroxysmal atrial fibrillation: Secondary | ICD-10-CM | POA: Diagnosis not present

## 2017-03-21 DIAGNOSIS — I130 Hypertensive heart and chronic kidney disease with heart failure and stage 1 through stage 4 chronic kidney disease, or unspecified chronic kidney disease: Secondary | ICD-10-CM | POA: Diagnosis not present

## 2017-03-21 DIAGNOSIS — G629 Polyneuropathy, unspecified: Secondary | ICD-10-CM | POA: Diagnosis not present

## 2017-03-21 DIAGNOSIS — R634 Abnormal weight loss: Secondary | ICD-10-CM | POA: Diagnosis not present

## 2017-03-21 DIAGNOSIS — N182 Chronic kidney disease, stage 2 (mild): Secondary | ICD-10-CM | POA: Diagnosis not present

## 2017-03-21 DIAGNOSIS — I10 Essential (primary) hypertension: Secondary | ICD-10-CM | POA: Diagnosis not present

## 2017-03-21 DIAGNOSIS — E559 Vitamin D deficiency, unspecified: Secondary | ICD-10-CM | POA: Diagnosis not present

## 2017-03-21 DIAGNOSIS — M810 Age-related osteoporosis without current pathological fracture: Secondary | ICD-10-CM | POA: Diagnosis not present

## 2017-03-21 DIAGNOSIS — R5383 Other fatigue: Secondary | ICD-10-CM | POA: Diagnosis not present

## 2017-03-21 DIAGNOSIS — R269 Unspecified abnormalities of gait and mobility: Secondary | ICD-10-CM | POA: Diagnosis not present

## 2017-03-22 DIAGNOSIS — M5440 Lumbago with sciatica, unspecified side: Secondary | ICD-10-CM | POA: Diagnosis not present

## 2017-03-22 DIAGNOSIS — G629 Polyneuropathy, unspecified: Secondary | ICD-10-CM | POA: Diagnosis not present

## 2017-03-22 DIAGNOSIS — I447 Left bundle-branch block, unspecified: Secondary | ICD-10-CM | POA: Diagnosis not present

## 2017-03-22 DIAGNOSIS — D510 Vitamin B12 deficiency anemia due to intrinsic factor deficiency: Secondary | ICD-10-CM | POA: Diagnosis not present

## 2017-03-22 DIAGNOSIS — I10 Essential (primary) hypertension: Secondary | ICD-10-CM | POA: Diagnosis not present

## 2017-03-22 DIAGNOSIS — M810 Age-related osteoporosis without current pathological fracture: Secondary | ICD-10-CM | POA: Diagnosis not present

## 2017-03-22 DIAGNOSIS — E782 Mixed hyperlipidemia: Secondary | ICD-10-CM | POA: Diagnosis not present

## 2017-03-22 DIAGNOSIS — I4891 Unspecified atrial fibrillation: Secondary | ICD-10-CM | POA: Diagnosis not present

## 2017-03-22 DIAGNOSIS — E559 Vitamin D deficiency, unspecified: Secondary | ICD-10-CM | POA: Diagnosis not present

## 2017-03-28 DIAGNOSIS — I4891 Unspecified atrial fibrillation: Secondary | ICD-10-CM | POA: Diagnosis not present

## 2017-03-28 DIAGNOSIS — E782 Mixed hyperlipidemia: Secondary | ICD-10-CM | POA: Diagnosis not present

## 2017-03-28 DIAGNOSIS — M5440 Lumbago with sciatica, unspecified side: Secondary | ICD-10-CM | POA: Diagnosis not present

## 2017-03-28 DIAGNOSIS — I10 Essential (primary) hypertension: Secondary | ICD-10-CM | POA: Diagnosis not present

## 2017-03-28 DIAGNOSIS — M810 Age-related osteoporosis without current pathological fracture: Secondary | ICD-10-CM | POA: Diagnosis not present

## 2017-03-28 DIAGNOSIS — I447 Left bundle-branch block, unspecified: Secondary | ICD-10-CM | POA: Diagnosis not present

## 2017-03-28 DIAGNOSIS — D510 Vitamin B12 deficiency anemia due to intrinsic factor deficiency: Secondary | ICD-10-CM | POA: Diagnosis not present

## 2017-03-28 DIAGNOSIS — E559 Vitamin D deficiency, unspecified: Secondary | ICD-10-CM | POA: Diagnosis not present

## 2017-03-28 DIAGNOSIS — G629 Polyneuropathy, unspecified: Secondary | ICD-10-CM | POA: Diagnosis not present

## 2017-03-29 DIAGNOSIS — I4891 Unspecified atrial fibrillation: Secondary | ICD-10-CM | POA: Diagnosis not present

## 2017-03-29 DIAGNOSIS — I10 Essential (primary) hypertension: Secondary | ICD-10-CM | POA: Diagnosis not present

## 2017-03-29 DIAGNOSIS — I447 Left bundle-branch block, unspecified: Secondary | ICD-10-CM | POA: Diagnosis not present

## 2017-03-29 DIAGNOSIS — D510 Vitamin B12 deficiency anemia due to intrinsic factor deficiency: Secondary | ICD-10-CM | POA: Diagnosis not present

## 2017-03-29 DIAGNOSIS — E782 Mixed hyperlipidemia: Secondary | ICD-10-CM | POA: Diagnosis not present

## 2017-03-29 DIAGNOSIS — E559 Vitamin D deficiency, unspecified: Secondary | ICD-10-CM | POA: Diagnosis not present

## 2017-03-29 DIAGNOSIS — M5440 Lumbago with sciatica, unspecified side: Secondary | ICD-10-CM | POA: Diagnosis not present

## 2017-03-29 DIAGNOSIS — G629 Polyneuropathy, unspecified: Secondary | ICD-10-CM | POA: Diagnosis not present

## 2017-03-29 DIAGNOSIS — M810 Age-related osteoporosis without current pathological fracture: Secondary | ICD-10-CM | POA: Diagnosis not present

## 2017-03-30 ENCOUNTER — Other Ambulatory Visit: Payer: Self-pay

## 2017-03-30 ENCOUNTER — Encounter (HOSPITAL_COMMUNITY): Payer: Self-pay | Admitting: Nurse Practitioner

## 2017-03-30 ENCOUNTER — Observation Stay (HOSPITAL_COMMUNITY)
Admission: EM | Admit: 2017-03-30 | Discharge: 2017-04-01 | Disposition: A | Discharge status: Home Health | Payer: Medicare HMO | Attending: Family Medicine | Admitting: Family Medicine

## 2017-03-30 DIAGNOSIS — G629 Polyneuropathy, unspecified: Secondary | ICD-10-CM | POA: Diagnosis not present

## 2017-03-30 DIAGNOSIS — Z923 Personal history of irradiation: Secondary | ICD-10-CM | POA: Diagnosis not present

## 2017-03-30 DIAGNOSIS — K529 Noninfective gastroenteritis and colitis, unspecified: Secondary | ICD-10-CM | POA: Insufficient documentation

## 2017-03-30 DIAGNOSIS — R627 Adult failure to thrive: Secondary | ICD-10-CM | POA: Diagnosis not present

## 2017-03-30 DIAGNOSIS — I10 Essential (primary) hypertension: Secondary | ICD-10-CM | POA: Insufficient documentation

## 2017-03-30 DIAGNOSIS — F419 Anxiety disorder, unspecified: Secondary | ICD-10-CM | POA: Insufficient documentation

## 2017-03-30 DIAGNOSIS — E861 Hypovolemia: Secondary | ICD-10-CM | POA: Diagnosis not present

## 2017-03-30 DIAGNOSIS — I951 Orthostatic hypotension: Secondary | ICD-10-CM

## 2017-03-30 DIAGNOSIS — M6281 Muscle weakness (generalized): Secondary | ICD-10-CM | POA: Diagnosis not present

## 2017-03-30 DIAGNOSIS — Z87440 Personal history of urinary (tract) infections: Secondary | ICD-10-CM | POA: Diagnosis present

## 2017-03-30 DIAGNOSIS — Z880 Allergy status to penicillin: Secondary | ICD-10-CM | POA: Diagnosis not present

## 2017-03-30 DIAGNOSIS — R112 Nausea with vomiting, unspecified: Secondary | ICD-10-CM | POA: Diagnosis not present

## 2017-03-30 DIAGNOSIS — Z7901 Long term (current) use of anticoagulants: Secondary | ICD-10-CM | POA: Diagnosis not present

## 2017-03-30 DIAGNOSIS — N390 Urinary tract infection, site not specified: Secondary | ICD-10-CM | POA: Insufficient documentation

## 2017-03-30 DIAGNOSIS — E871 Hypo-osmolality and hyponatremia: Secondary | ICD-10-CM | POA: Diagnosis not present

## 2017-03-30 DIAGNOSIS — R42 Dizziness and giddiness: Secondary | ICD-10-CM | POA: Diagnosis not present

## 2017-03-30 DIAGNOSIS — I4891 Unspecified atrial fibrillation: Secondary | ICD-10-CM | POA: Diagnosis present

## 2017-03-30 DIAGNOSIS — R531 Weakness: Secondary | ICD-10-CM | POA: Diagnosis not present

## 2017-03-30 DIAGNOSIS — G5602 Carpal tunnel syndrome, left upper limb: Secondary | ICD-10-CM | POA: Diagnosis not present

## 2017-03-30 DIAGNOSIS — I480 Paroxysmal atrial fibrillation: Secondary | ICD-10-CM | POA: Insufficient documentation

## 2017-03-30 DIAGNOSIS — Z66 Do not resuscitate: Secondary | ICD-10-CM | POA: Diagnosis not present

## 2017-03-30 DIAGNOSIS — G4700 Insomnia, unspecified: Secondary | ICD-10-CM | POA: Insufficient documentation

## 2017-03-30 DIAGNOSIS — Z79899 Other long term (current) drug therapy: Secondary | ICD-10-CM | POA: Insufficient documentation

## 2017-03-30 DIAGNOSIS — Z8542 Personal history of malignant neoplasm of other parts of uterus: Secondary | ICD-10-CM | POA: Diagnosis not present

## 2017-03-30 DIAGNOSIS — R410 Disorientation, unspecified: Secondary | ICD-10-CM | POA: Diagnosis not present

## 2017-03-30 DIAGNOSIS — E039 Hypothyroidism, unspecified: Secondary | ICD-10-CM | POA: Insufficient documentation

## 2017-03-30 DIAGNOSIS — F329 Major depressive disorder, single episode, unspecified: Secondary | ICD-10-CM | POA: Insufficient documentation

## 2017-03-30 DIAGNOSIS — M5136 Other intervertebral disc degeneration, lumbar region: Secondary | ICD-10-CM | POA: Insufficient documentation

## 2017-03-30 DIAGNOSIS — M5117 Intervertebral disc disorders with radiculopathy, lumbosacral region: Secondary | ICD-10-CM | POA: Insufficient documentation

## 2017-03-30 DIAGNOSIS — I959 Hypotension, unspecified: Secondary | ICD-10-CM | POA: Diagnosis not present

## 2017-03-30 DIAGNOSIS — R0602 Shortness of breath: Secondary | ICD-10-CM | POA: Diagnosis not present

## 2017-03-30 DIAGNOSIS — Z9181 History of falling: Secondary | ICD-10-CM | POA: Insufficient documentation

## 2017-03-30 DIAGNOSIS — G319 Degenerative disease of nervous system, unspecified: Secondary | ICD-10-CM | POA: Insufficient documentation

## 2017-03-30 LAB — URINALYSIS, ROUTINE W REFLEX MICROSCOPIC
Bilirubin Urine: NEGATIVE
GLUCOSE, UA: NEGATIVE mg/dL
Hgb urine dipstick: NEGATIVE
Ketones, ur: 5 mg/dL — AB
NITRITE: NEGATIVE
PROTEIN: NEGATIVE mg/dL
SPECIFIC GRAVITY, URINE: 1.011 (ref 1.005–1.030)
pH: 5 (ref 5.0–8.0)

## 2017-03-30 LAB — BASIC METABOLIC PANEL
ANION GAP: 14 (ref 5–15)
BUN: 13 mg/dL (ref 6–20)
CHLORIDE: 94 mmol/L — AB (ref 101–111)
CO2: 23 mmol/L (ref 22–32)
Calcium: 6.5 mg/dL — ABNORMAL LOW (ref 8.9–10.3)
Creatinine, Ser: 0.93 mg/dL (ref 0.44–1.00)
GFR calc Af Amer: 60 mL/min — ABNORMAL LOW (ref >60)
GFR calc non Af Amer: 51 mL/min — ABNORMAL LOW (ref >60)
GLUCOSE: 133 mg/dL — AB (ref 65–99)
POTASSIUM: 4.5 mmol/L (ref 3.5–5.1)
Sodium: 131 mmol/L — ABNORMAL LOW (ref 135–145)

## 2017-03-30 LAB — CBC WITH DIFFERENTIAL/PLATELET
BASOS ABS: 0 10*3/uL (ref 0.0–0.1)
Basophils Relative: 0 %
Eosinophils Absolute: 0 10*3/uL (ref 0.0–0.7)
Eosinophils Relative: 0 %
HEMATOCRIT: 39.6 % (ref 36.0–46.0)
Hemoglobin: 13.2 g/dL (ref 12.0–15.0)
LYMPHS ABS: 1.8 10*3/uL (ref 0.7–4.0)
LYMPHS PCT: 18 %
MCH: 29.3 pg (ref 26.0–34.0)
MCHC: 33.3 g/dL (ref 30.0–36.0)
MCV: 87.8 fL (ref 78.0–100.0)
Monocytes Absolute: 0.7 10*3/uL (ref 0.1–1.0)
Monocytes Relative: 7 %
NEUTROS ABS: 7.5 10*3/uL (ref 1.7–7.7)
Neutrophils Relative %: 75 %
Platelets: 320 10*3/uL (ref 150–400)
RBC: 4.51 MIL/uL (ref 3.87–5.11)
RDW: 14.6 % (ref 11.5–15.5)
WBC: 10 10*3/uL (ref 4.0–10.5)

## 2017-03-30 MED ORDER — VITAMIN D 1000 UNITS PO TABS
1000.0000 [IU] | ORAL_TABLET | Freq: Every day | ORAL | Status: DC
  Administered 2017-03-31 – 2017-04-01 (×2): 1000 [IU] via ORAL
  Filled 2017-03-30 (×2): qty 1

## 2017-03-30 MED ORDER — SODIUM CHLORIDE 0.9 % IV SOLN
2.0000 g | Freq: Once | INTRAVENOUS | Status: AC
  Administered 2017-03-31: 2 g via INTRAVENOUS
  Filled 2017-03-30: qty 20

## 2017-03-30 MED ORDER — SODIUM CHLORIDE 0.9% FLUSH
3.0000 mL | Freq: Two times a day (BID) | INTRAVENOUS | Status: DC
  Administered 2017-03-31 (×2): 3 mL via INTRAVENOUS
  Administered 2017-04-01: 09:00:00 via INTRAVENOUS

## 2017-03-30 MED ORDER — MIRTAZAPINE 7.5 MG PO TABS
7.5000 mg | ORAL_TABLET | Freq: Every day | ORAL | Status: DC
  Administered 2017-03-31: 7.5 mg via ORAL
  Filled 2017-03-30 (×2): qty 1

## 2017-03-30 MED ORDER — PANTOPRAZOLE SODIUM 40 MG PO TBEC
40.0000 mg | DELAYED_RELEASE_TABLET | Freq: Every day | ORAL | Status: DC
  Administered 2017-03-31 – 2017-04-01 (×2): 40 mg via ORAL
  Filled 2017-03-30 (×2): qty 1

## 2017-03-30 MED ORDER — PROMETHAZINE HCL 25 MG/ML IJ SOLN: 6.2500 mg | Freq: Four times a day (QID) | INTRAMUSCULAR | Status: DC | PRN

## 2017-03-30 MED ORDER — GABAPENTIN 100 MG PO CAPS: 100.0000 mg | ORAL_CAPSULE | Freq: Two times a day (BID) | ORAL | Status: DC | PRN

## 2017-03-30 MED ORDER — METOPROLOL TARTRATE 25 MG PO TABS
25.0000 mg | ORAL_TABLET | Freq: Two times a day (BID) | ORAL | Status: DC
  Administered 2017-03-31 – 2017-04-01 (×4): 25 mg via ORAL
  Filled 2017-03-30 (×4): qty 1

## 2017-03-30 MED ORDER — HYDROCODONE-ACETAMINOPHEN 5-325 MG PO TABS: 1.0000 | ORAL_TABLET | ORAL | Status: DC | PRN

## 2017-03-30 MED ORDER — MECLIZINE HCL 25 MG PO TABS: 12.5000 mg | ORAL_TABLET | Freq: Three times a day (TID) | ORAL | Status: DC | PRN

## 2017-03-30 MED ORDER — RIVAROXABAN 10 MG PO TABS
10.0000 mg | ORAL_TABLET | Freq: Every day | ORAL | Status: DC
Start: 2017-03-31 — End: 2017-04-01
  Administered 2017-03-31 – 2017-04-01 (×2): 10 mg via ORAL
  Filled 2017-03-30 (×2): qty 1

## 2017-03-30 MED ORDER — POLYETHYLENE GLYCOL 3350 17 G PO PACK: 17.0000 g | PACK | Freq: Every day | ORAL | Status: DC | PRN

## 2017-03-30 MED ORDER — METHOCARBAMOL 500 MG PO TABS: 500.0000 mg | ORAL_TABLET | Freq: Three times a day (TID) | ORAL | Status: DC | PRN

## 2017-03-30 MED ORDER — CALCIUM CARBONATE 1250 (500 CA) MG PO TABS
1250.0000 mg | ORAL_TABLET | Freq: Three times a day (TID) | ORAL | Status: DC
  Administered 2017-03-31 – 2017-04-01 (×5): 1250 mg via ORAL
  Filled 2017-03-30 (×5): qty 1

## 2017-03-30 MED ORDER — SODIUM CHLORIDE 0.9 % IV BOLUS (SEPSIS)
1000.0000 mL | Freq: Once | INTRAVENOUS | Status: AC
  Administered 2017-03-30: 1000 mL via INTRAVENOUS

## 2017-03-30 MED ORDER — ADULT MULTIVITAMIN W/MINERALS CH
1.0000 | ORAL_TABLET | Freq: Every day | ORAL | Status: DC
  Administered 2017-03-31 – 2017-04-01 (×2): 1 via ORAL
  Filled 2017-03-30 (×2): qty 1

## 2017-03-30 MED ORDER — HEPARIN SODIUM (PORCINE) 5000 UNIT/ML IJ SOLN: 5000.0000 [IU] | Freq: Three times a day (TID) | INTRAMUSCULAR | Status: DC

## 2017-03-30 MED ORDER — ACETAMINOPHEN 325 MG PO TABS: 650.0000 mg | ORAL_TABLET | Freq: Four times a day (QID) | ORAL | Status: DC | PRN

## 2017-03-30 MED ORDER — LORAZEPAM 0.5 MG PO TABS
0.5000 mg | ORAL_TABLET | Freq: Four times a day (QID) | ORAL | Status: DC | PRN
  Administered 2017-03-31: 0.5 mg via ORAL
  Filled 2017-03-30 (×3): qty 1

## 2017-03-30 MED ORDER — RALOXIFENE HCL 60 MG PO TABS
60.0000 mg | ORAL_TABLET | Freq: Every day | ORAL | Status: DC
  Administered 2017-03-31 – 2017-04-01 (×2): 60 mg via ORAL
  Filled 2017-03-30 (×2): qty 1

## 2017-03-30 MED ORDER — ONDANSETRON HCL 4 MG/2ML IJ SOLN: 4.0000 mg | Freq: Four times a day (QID) | INTRAMUSCULAR | Status: DC | PRN

## 2017-03-30 MED ORDER — LOPERAMIDE HCL 2 MG PO CAPS
2.0000 mg | ORAL_CAPSULE | Freq: Every day | ORAL | Status: DC | PRN
Start: 2017-03-30 — End: 2017-04-01

## 2017-03-30 MED ORDER — MECLIZINE HCL 25 MG PO TABS
25.0000 mg | ORAL_TABLET | Freq: Once | ORAL | Status: AC
  Administered 2017-03-30: 25 mg via ORAL
  Filled 2017-03-30: qty 1

## 2017-03-30 MED ORDER — LEVOTHYROXINE SODIUM 50 MCG PO TABS
50.0000 ug | ORAL_TABLET | Freq: Every day | ORAL | Status: DC
  Administered 2017-03-31 – 2017-04-01 (×2): 50 ug via ORAL
  Filled 2017-03-30 (×2): qty 1

## 2017-03-30 MED ORDER — ONDANSETRON HCL 4 MG PO TABS
4.0000 mg | ORAL_TABLET | Freq: Four times a day (QID) | ORAL | Status: DC | PRN
Start: 2017-03-30 — End: 2017-04-01

## 2017-03-30 MED ORDER — ONDANSETRON HCL 4 MG/2ML IJ SOLN
4.0000 mg | Freq: Once | INTRAMUSCULAR | Status: AC
  Administered 2017-03-30: 4 mg via INTRAVENOUS
  Filled 2017-03-30: qty 2

## 2017-03-30 MED ORDER — ACETAMINOPHEN 650 MG RE SUPP: 650.0000 mg | Freq: Four times a day (QID) | RECTAL | Status: DC | PRN

## 2017-03-30 MED ORDER — SODIUM CHLORIDE 0.9 % IV SOLN
INTRAVENOUS | Status: AC
  Administered 2017-03-31: 02:00:00 via INTRAVENOUS

## 2017-03-30 NOTE — ED Provider Notes (Signed)
[facility] DEPT Provider Note   CSN: [id number] Arrival date & time: 03/30/17  1518     History   Chief Complaint Chief Complaint  Patient presents with  . Weakness    HPI Teresa Gonzalez is a 83 y.o. female.  The history is provided by the patient and a relative.  Dizziness  Quality:  Room spinning Severity:  Moderate Duration:  3 days Timing:  Intermittent Progression:  Waxing and waning Chronicity:  Recurrent Context: head movement   Relieved by:  Being still Worsened by:  Movement Associated symptoms: diarrhea (chronic), nausea, shortness of breath (mild; more frequent than usual) and vomiting   Associated symptoms: no blood in stool, no chest pain, no syncope, no tinnitus and no vision changes   Risk factors: hx of vertigo (on meclizine)    H/o recurrent UTI currently on Cipro.    Past Medical History:  Diagnosis Date  . Depression   . Depression   . Hypothyroidism   . Insomnia   . Left carpal tunnel syndrome    By nerve conduction study  . Peripheral neuropathy   . S/P small bowel resection    with Diarrhea  . Status post radiation therapy   . Uterine cancer ([place])   . Vitamin D deficiency     Patient Active Problem List   Diagnosis Date Noted  . Vertigo 03/30/2017  . Hypoglycemia 02/19/2017  . UTI (urinary tract infection) 02/18/2017  . Generalized weakness 02/18/2017  . Hypokalemia 02/18/2017  . Hypocalcemia 02/18/2017  . Hypomagnesemia 02/18/2017  . Chronic diarrhea   . Atrial fibrillation ([place])   . Hyponatremia   . Osteoporosis   . Abnormality of gait 01/11/2012  . Disturbance of skin sensation 01/11/2012  . Degeneration of lumbar or lumbosacral intervertebral disc 01/11/2012  . Lumbosacral root lesions, not elsewhere classified 01/11/2012  . Polyneuropathy in other diseases classified elsewhere ([place]) 01/11/2012    Past Surgical History:  Procedure Laterality Date  . ABDOMINAL HYSTERECTOMY    . CATARACT EXTRACTION Bilateral      OB History    No data available       Home Medications    Prior to Admission medications   Medication Sig Start Date End Date Taking? Authorizing Provider  Calcium Carbonate (CALTRATE 600 PO) Take 600 mg by mouth 3 (three) times daily.    Yes [provider]  Cholecalciferol (VITAMIN D3) 1000 units CAPS Take 1,000 Units by mouth daily.   Yes [provider]  furosemide (LASIX) 20 MG tablet Take 20 mg by mouth daily.   Yes [provider]  gabapentin (NEURONTIN) 100 MG capsule Take 100 mg by mouth 2 (two) times daily as needed (nerve pain).   Yes [provider]  loperamide (IMODIUM) 2 MG capsule Take 1 capsule (2 mg total) by mouth 5 (five) times daily as needed for diarrhea or loose stools. 10/23/13  Yes Shon Baton, MD  LORazepam (ATIVAN) 0.5 MG tablet Take 0.5 mg by mouth daily as needed for anxiety. 01/26/17  Yes [provider]  meclizine (ANTIVERT) 12.5 MG tablet Take 12.5 mg by mouth 3 (three) times daily as needed for dizziness.   Yes [provider]  methocarbamol (ROBAXIN) 500 MG tablet Take 500 mg by mouth every 8 (eight) hours as needed for muscle spasms.   Yes [provider]  metoprolol tartrate (LOPRESSOR) 25 MG tablet Take 1 tablet (25 mg total) by mouth 2 (two) times daily. 08/06/15  Yes Cherene Altes, MD  mirtazapine (REMERON) 15 MG tablet Take 7.5 mg by mouth at bedtime.   Yes [provider]  Multiple Vitamins-Minerals (MULTIVITAMIN WITH MINERALS) tablet Take 1 tablet by mouth daily.   Yes [provider]  omeprazole (PRILOSEC) 40 MG capsule Take 40 mg by mouth daily.   Yes [provider]  potassium chloride SA (K-DUR,KLOR-CON) 20 MEQ tablet Take 1 tablet (20 mEq total) by mouth daily. 02/20/17  Yes Rosita Fire, MD  raloxifene (EVISTA) 60 MG tablet Take 60 mg by mouth daily.   Yes [provider]  rivaroxaban (XARELTO) 10 MG TABS tablet Take 10 mg by mouth  daily.   Yes [provider]  SYNTHROID 50 MCG tablet Take 50 mcg by mouth daily. 07/01/15  Yes [provider]  VITAMIN B1-B12 IJ Inject 1 mL into the skin every 21 ( twenty-one) days.    Yes [provider]  Vitamin D, Ergocalciferol, (DRISDOL) 50000 UNITS CAPS Take 50,000 Units by mouth See admin instructions. Takes on Monday, Wednesday, Friday and Sunday   Yes [provider]    Family History Family History  Problem Relation Age of Onset  . Stroke Mother   . Aneurysm Father   . Diabetes Brother     Social History Social History  Substance Use Topics  . Smoking status: Never Smoker  . Smokeless tobacco: Never Used  . Alcohol use No     Allergies   Codeine and Penicillins   Review of Systems Review of Systems  HENT: Negative for tinnitus.   Respiratory: Positive for shortness of breath (mild; more frequent than usual).   Cardiovascular: Negative for chest pain and syncope.  Gastrointestinal: Positive for diarrhea (chronic), nausea and vomiting. Negative for blood in stool.  Neurological: Positive for dizziness.   All other systems are reviewed and are negative for acute change except as noted in the HPI   Physical Exam Updated Vital Signs BP 131/72   Pulse 87   Temp 97.6 F (36.4 C) (Oral)   Resp 17   SpO2 99%   Physical Exam  Constitutional: She is oriented to person, place, and time. She appears well-developed and well-nourished. No distress.  HENT:  Head: Normocephalic and atraumatic.  Nose: Nose normal.  Eyes: Conjunctivae and EOM are normal. Pupils are equal, round, and reactive to light. Right eye exhibits no discharge. Left eye exhibits no discharge. No scleral icterus.  Neck: Normal range of motion. Neck supple.  Cardiovascular: Normal rate and regular rhythm.  Exam reveals no gallop and no friction rub.   No murmur heard. Pulmonary/Chest: Effort normal and breath sounds normal. No stridor. No respiratory distress.  She has no rales.  Abdominal: Soft. She exhibits no distension. There is no tenderness.  Musculoskeletal: She exhibits no edema or tenderness.  Neurological: She is alert and oriented to person, place, and time.  Mental Status: Alert and oriented to person, place, and time. Attention and concentration normal. Speech clear. Recent memory is intact  Cranial Nerves  II Visual Fields: Intact to confrontation. Visual fields intact. III, IV, VI: Pupils equal and reactive to light and near. Full eye movement without nystagmus  V Facial Sensation: Normal. No weakness of masticatory muscles  VII: No facial weakness or asymmetry  VIII Auditory Acuity: Grossly normal  IX/X: The uvula is midline; the palate elevates symmetrically  XI: Normal sternocleidomastoid and trapezius strength  XII: The tongue is midline. No atrophy or fasciculations.   Motor System: Muscle Strength: 5/5 and symmetric in  the upper and lower extremities. No pronation or drift.  Muscle Tone: Tone and muscle bulk are normal in the upper and lower extremities.   Reflexes: DTRs: 1+ and symmetrical in all four extremities. Plantar responses are flexor bilaterally.  Coordination: Intact finger-to-nose, heel-to-shin. No tremor.  Sensation: Intact to light touch, and pinprick. Gait: deferred  HINTS: Nystagmus: none Head impulse. Abnormal to the right Skew: normal    Skin: Skin is warm and dry. No rash noted. She is not diaphoretic. No erythema.  Psychiatric: She has a normal mood and affect.  Vitals reviewed.    ED Treatments / Results  Labs (all labs ordered are listed, but only abnormal results are displayed) Labs Reviewed  URINALYSIS, ROUTINE W REFLEX MICROSCOPIC - Abnormal; Notable for the following:       Result Value   APPearance HAZY (*)    Ketones, ur 5 (*)    Leukocytes, UA TRACE (*)    Bacteria, UA RARE (*)    Squamous Epithelial / LPF 0-5 (*)    All other components within normal limits  BASIC METABOLIC  PANEL - Abnormal; Notable for the following:    Sodium 131 (*)    Chloride 94 (*)    Glucose, Bld 133 (*)    Calcium 6.5 (*)    GFR calc non Af Amer 51 (*)    GFR calc Af Amer 60 (*)    All other components within normal limits  CBC WITH DIFFERENTIAL/PLATELET    EKG  EKG Interpretation None       Radiology No results found.  Procedures Procedures (including critical care time) CRITICAL CARE Performed by: Grayce Sessions Alayla Dethlefs Total critical care time: 45 minutes Critical care time was exclusive of separately billable procedures and treating other patients. Critical care was necessary to treat or prevent imminent or life-threatening deterioration. Critical care was time spent personally by me on the following activities: development of treatment plan with patient and/or surrogate as well as nursing, discussions with consultants, evaluation of patient's response to treatment, examination of patient, obtaining history from patient or surrogate, ordering and performing treatments and interventions, ordering and review of laboratory studies, ordering and review of radiographic studies, pulse oximetry and re-evaluation of patient's condition.   Medications Ordered in ED Medications  sodium chloride 0.9 % bolus 1,000 mL (not administered)  meclizine (ANTIVERT) tablet 25 mg (25 mg Oral Given 03/30/17 1833)  sodium chloride 0.9 % bolus 1,000 mL (0 mLs Intravenous Stopped 03/30/17 2301)  ondansetron (ZOFRAN) injection 4 mg (4 mg Intravenous Given 03/30/17 1901)     Initial Impression / Assessment and Plan / ED Course  I have reviewed the triage vital signs and the nursing notes.  Pertinent labs & imaging results that were available during my care of the patient were reviewed by me and considered in my medical decision making (see chart for details).     No focal neurologic deficits on exam. Hints exam was reassuring for peripheral etiology. Low suspicion for central process  including CVA. Given her history of BPPV, distance to fit with her symptoms. Patient was given meclizine, nausea meds, IV fluids.   Screening labs are obtained which were grossly reassuring, other than mild hypocalcemia.   UA was obtained given her recent history of urinary tract infection which did not reveal evidence of infection.  After treatment patient reported that she was feeling better however while trying to ambulate the patient she became symptomatic. We also noted that the patient became tachycardic into  the 160s. EKG revealed sinus tachycardia without evidence of dysrhythmia. Blood pressure remained stable. When her heart rate returned to normal and the EKG was obtained revealing sinus rhythm with left bundle branch block but no acute changes since prior EKGs. Patient was given additional IV fluid bolus.   Given her significant symptomatology and need for additional IV fluids patient was admitted for further management.  Final Clinical Impressions(s) / ED Diagnoses   Final diagnoses:  None    New Prescriptions New Prescriptions   No medications on file     Fatima Blank, MD 03/30/17 2307

## 2017-03-30 NOTE — Care Management (Signed)
ED CM met with patient and daughter Ashely at bedside to discuss care transitional planning. Patient reports being active with [facility] services

## 2017-03-30 NOTE — H&P (Signed)
History and Physical    [name] VHQ:[id number] DOB: [birth year] DOA: 03/30/2017  PCP: Shon Baton, MD   Patient coming from: Home, by way of PCP clinic   Chief Complaint: Vertigo, weakness, dysuria, transient disorientation    HPI: Teresa Gonzalez is a 83 y.o. female with medical history significant for hypothyroidism, depression, anxiety, paroxysmal atrial fibrillation on Xarelto, and recurrent UTIs, now presenting to the emergency department at the direction of her PCP for evaluation of vertigo with marked generalized weakness, nausea, and a couple episodes of vomiting. Patient reports suffering from recurrent UTIs for the past few months and just completed a course of ciprofloxacin today. Aside from urinary urgency and frequency, she has also developed progressive generalized weakness over the past several months, but her condition worsened acutely on 03/27/2017 when she developed a sensation as though the room was spinning, worse with certain movements, and associated with nausea and nonbloody nonbilious vomiting, persisting through today. She has had a loss of appetite over this interval as well. She denies any headache, change in vision or hearing, or focal numbness or weakness. She denies any recent fevers or chills and denies any significant dyspnea or cough. There is no chest pain or palpitations. Daughter at bedside reports that patient was quite disoriented this morning, noting this is unusual. Of note, she reports developing bilateral lower extremity edema and exertional dyspnea a few weeks ago that resolved with Lasix at home. She reports experiencing a similar vertiginous symptoms in recent months and has been taking antiemetics and meclizine at home with intermittent relief. There has not been a head CT or MRI during this interval.  ED Course: Upon arrival to the ED, patient is found to be afebrile, saturating well on room air, and with vitals otherwise stable. EKG features a sinus  tachycardia with rate 103, left axis deviation, incomplete right bundle-branch block, and ST depressions in the anterolateral leads which appears similar to prior EKGs. Chemistry panels notable for a sodium of 131 and chloride of 94. Serum calcium is 6.5 with an albumin of 2.5 last month. CBC is within the normal limits and urinalysis is unremarkable. Patient was given 2 L normal saline, Zofran, and meclizine in the emergency department. She reported feeling somewhat better with this, but was unable to stand due to severe vertigo. She is typically able to walk without assistance at home and manage most of her ADLs. She also became severely tachycardic with apparent sinus rhythm on the monitor when she attempted to stand up. She will be observed on the telemetry unit for ongoing evaluation and management dizziness with nausea, vomiting, and generalized weakness with acute inability to stand, suspected secondary to vertigo.  Review of Systems:  All other systems reviewed and apart from HPI, are negative.  Past Medical History:  Diagnosis Date  . Depression   . Depression   . Hypothyroidism   . Insomnia   . Left carpal tunnel syndrome    By nerve conduction study  . Peripheral neuropathy   . S/P small bowel resection    with Diarrhea  . Status post radiation therapy   . Uterine cancer ([place])   . Vitamin D deficiency     Past Surgical History:  Procedure Laterality Date  . ABDOMINAL HYSTERECTOMY    . CATARACT EXTRACTION Bilateral      reports that she has never smoked. She has never used smokeless tobacco. She reports that she does not drink alcohol or use drugs.  Allergies: Codeine (itching),  penicillins (swelling).   Family History  Problem Relation Age of Onset  . Stroke Mother   . Aneurysm Father   . Diabetes Brother      Prior to Admission medications   Medication Sig Start Date End Date Taking? Authorizing Provider  Calcium Carbonate (CALTRATE 600 PO) Take 600 mg by mouth 3  (three) times daily.    Yes [provider]  Cholecalciferol (VITAMIN D3) 1000 units CAPS Take 1,000 Units by mouth daily.   Yes [provider]  furosemide (LASIX) 20 MG tablet Take 20 mg by mouth daily.   Yes [provider]  gabapentin (NEURONTIN) 100 MG capsule Take 100 mg by mouth 2 (two) times daily as needed (nerve pain).   Yes [provider]  loperamide (IMODIUM) 2 MG capsule Take 1 capsule (2 mg total) by mouth 5 (five) times daily as needed for diarrhea or loose stools. 10/23/13  Yes Shon Baton, MD  LORazepam (ATIVAN) 0.5 MG tablet Take 0.5 mg by mouth daily as needed for anxiety. 01/26/17  Yes [provider]  meclizine (ANTIVERT) 12.5 MG tablet Take 12.5 mg by mouth 3 (three) times daily as needed for dizziness.   Yes [provider]  methocarbamol (ROBAXIN) 500 MG tablet Take 500 mg by mouth every 8 (eight) hours as needed for muscle spasms.   Yes [provider]  metoprolol tartrate (LOPRESSOR) 25 MG tablet Take 1 tablet (25 mg total) by mouth 2 (two) times daily. 08/06/15  Yes Cherene Altes, MD  mirtazapine (REMERON) 15 MG tablet Take 7.5 mg by mouth at bedtime.   Yes [provider]  Multiple Vitamins-Minerals (MULTIVITAMIN WITH MINERALS) tablet Take 1 tablet by mouth daily.   Yes [provider]  omeprazole (PRILOSEC) 40 MG capsule Take 40 mg by mouth daily.   Yes [provider]  potassium chloride SA (K-DUR,KLOR-CON) 20 MEQ tablet Take 1 tablet (20 mEq total) by mouth daily. 02/20/17  Yes Rosita Fire, MD  raloxifene (EVISTA) 60 MG tablet Take 60 mg by mouth daily.   Yes [provider]  rivaroxaban (XARELTO) 10 MG TABS tablet Take 10 mg by mouth daily.   Yes [provider]  SYNTHROID 50 MCG tablet Take 50 mcg by mouth daily. 07/01/15  Yes [provider]  VITAMIN B1-B12 IJ Inject 1 mL into the skin every 21 ( twenty-one) days.    Yes [provider]  Vitamin D, Ergocalciferol, (DRISDOL) 50000 UNITS CAPS Take 50,000 Units by mouth See admin instructions. Takes on Monday, Wednesday, Friday and Sunday   Yes [provider]    Physical Exam: Vitals:   03/30/17 2215 03/30/17 2230 03/30/17 2245 03/30/17 2300  BP: 109/65 119/71 120/73 123/69  Pulse: 97 94 91 87  Resp: (!) 31 (!) 22 18 (!) 25  Temp:      TempSrc:      SpO2: 95% 93% 93% 94%      Constitutional: No respiratory distress, calm, in apparent discomfort Eyes: PERTLA, lids and conjunctivae normal ENMT: Mucous membranes are moist. Posterior pharynx clear of any exudate or lesions.   Neck: normal, supple, no masses, no thyromegaly Respiratory: clear to auscultation bilaterally, no wheezing, no crackles. Normal respiratory effort.   Cardiovascular: Rate ~100 and regular. No extremity edema. No significant JVD. Abdomen: No distension, no tenderness, no masses palpated. Bowel sounds normal.  Musculoskeletal: no clubbing / cyanosis. No joint deformity upper and lower extremities.  Skin: no significant rashes, lesions, ulcers. Warm,  dry, well-perfused. Neurologic: No gross facial asymmetry. Sensation to light touch intact, patellar DTR's normal. Strength 5/5 in all 4 limbs.  Psychiatric: Alert and oriented x 3. Pleasant and cooperative.     Labs on Admission: I have personally reviewed following labs and imaging studies  CBC:  Recent Labs Lab 03/30/17 1902  WBC 10.0  NEUTROABS 7.5  HGB 13.2  HCT 39.6  MCV 87.8  PLT 242   Basic Metabolic Panel:  Recent Labs Lab 03/30/17 1902  NA 131*  K 4.5  CL 94*  CO2 23  GLUCOSE 133*  BUN 13  CREATININE 0.93  CALCIUM 6.5*   GFR: CrCl cannot be calculated (Unknown ideal weight.). Liver Function Tests: No results for input(s): AST, ALT, ALKPHOS, BILITOT, PROT, ALBUMIN in the last 168 hours. No results for input(s): LIPASE, AMYLASE in the last 168 hours. No results for input(s): AMMONIA in the last  168 hours. Coagulation Profile: No results for input(s): INR, PROTIME in the last 168 hours. Cardiac Enzymes: No results for input(s): CKTOTAL, CKMB, CKMBINDEX, TROPONINI in the last 168 hours. BNP (last 3 results) No results for input(s): PROBNP in the last 8760 hours. HbA1C: No results for input(s): HGBA1C in the last 72 hours. CBG: No results for input(s): GLUCAP in the last 168 hours. Lipid Profile: No results for input(s): CHOL, HDL, LDLCALC, TRIG, CHOLHDL, LDLDIRECT in the last 72 hours. Thyroid Function Tests: No results for input(s): TSH, T4TOTAL, FREET4, T3FREE, THYROIDAB in the last 72 hours. Anemia Panel: No results for input(s): VITAMINB12, FOLATE, FERRITIN, TIBC, IRON, RETICCTPCT in the last 72 hours. Urine analysis:    Component Value Date/Time   COLORURINE YELLOW 03/30/2017 2051   APPEARANCEUR HAZY (A) 03/30/2017 2051   LABSPEC 1.011 03/30/2017 2051   PHURINE 5.0 03/30/2017 2051   GLUCOSEU NEGATIVE 03/30/2017 2051   HGBUR NEGATIVE 03/30/2017 2051   [place] NEGATIVE 03/30/2017 2051   KETONESUR 5 (A) 03/30/2017 2051   PROTEINUR NEGATIVE 03/30/2017 2051   UROBILINOGEN 0.2 08/01/2015 0125   NITRITE NEGATIVE 03/30/2017 2051   LEUKOCYTESUR TRACE (A) 03/30/2017 2051   Sepsis Labs: @LABRCNTIP (procalcitonin:4,lacticidven:4) )No results found for this or any previous visit (from the past 240 hour(s)).   Radiological Exams on Admission: No results found.  EKG: Independently reviewed. Sinus tachycardia (rate 103), LAD, incomplete RBBB, anterolateral ST-depression.   Assessment/Plan  1. Vertigo  - Pt presents with dizziness, N/V, transient disorientation, generalized weakness  - Just completed a course of Cipro for UTI, which can cause similar sxs - She feels a little better after IVF, Zofran, and meclizine in ED, but still too symptomatic to stand  - Plan to observe on telemetry, obtain MRI brain to exclude a central etiology, and provide supportive care with  IVF, antiemetics, PT eval and tx   2. Hyponatremia - Serum sodium is 131 on admission in setting of hypovolemia  - She was given 2 liters NS in ED  - Continue NS infusion, mindful of recent edema and DOE that resolved with Lasix at home    3. Paroxysmal atrial fibrillation - In a sinus rhythm on admission  - CHADS-VASc is at least 59 (age x2, gender, HTN)  - Continue Xarelto and metoprolol    4. Hypocalcemia  - Serum calcium 6.5 on admission; albumin was 2.5 in May '18 - Using recent albumin, corrects to 7.7  - Given 1 g calcium gluconate, continue TUMS, check magnesium    5. Recurrent UTI's  - Pt has persistent urinary urgency and frequency despite  just completing a course of Cipro, her 3rd round of abx for UTI in last few mos  - UA not particularly suggestive of acute infection, will send for culture    DVT prophylaxis: sq heparin  Code Status: DNR  Family Communication: Daughter updated at bedside Disposition Plan: Observe on telemetry Consults called: None Admission status: Observation    Teresa Bulls, MD [facility] Pager [phone]  If 7PM-7AM, please contact night-coverage [email or web address] Password [email or web address]  03/30/2017, 11:25 PM

## 2017-03-30 NOTE — Care Management (Signed)
ED CM met with patient and daughter at bedside, patient reports living at home with care givers several hours per day, ambulated up until last week with a walker. Patient is active with [facility] receiving  RN/OT services. This week patient has been unable to bear weight and ambulate due to weakness.  Perpetue RN was unable to assist patient from sit to standing position. Dr. Leonette Monarch  Was updated,.

## 2017-03-30 NOTE — ED Notes (Signed)
While attempting to get pt up to ambulate per EDP order, pt got tachycardic and was unable to stand. EDP made aware.

## 2017-03-30 NOTE — ED Triage Notes (Signed)
Pt presents to ED today with c/o weakness. The weakness began on Sunday. She reports fatigue,confusion, poor oral intake, nausea, vomiting, diarrhea. She denies fevers, falls. She has been treated for several urinary tract infections over past three months and is taking cipro currently. She was sent from [facility] today for FURTHER EVALUATION OF weakness and confusion, unable to stand today. She had a chest xray, basic metabolic panel and CBC in office this afternoon and has the test results with her

## 2017-03-31 ENCOUNTER — Observation Stay (HOSPITAL_COMMUNITY): Payer: Medicare HMO

## 2017-03-31 DIAGNOSIS — R42 Dizziness and giddiness: Secondary | ICD-10-CM | POA: Diagnosis not present

## 2017-03-31 DIAGNOSIS — R531 Weakness: Secondary | ICD-10-CM | POA: Diagnosis not present

## 2017-03-31 DIAGNOSIS — E871 Hypo-osmolality and hyponatremia: Secondary | ICD-10-CM | POA: Diagnosis not present

## 2017-03-31 DIAGNOSIS — K529 Noninfective gastroenteritis and colitis, unspecified: Secondary | ICD-10-CM

## 2017-03-31 LAB — BASIC METABOLIC PANEL
Anion gap: 12 (ref 5–15)
BUN: 10 mg/dL (ref 6–20)
CO2: 19 mmol/L — ABNORMAL LOW (ref 22–32)
Calcium: 6.4 mg/dL — CL (ref 8.9–10.3)
Chloride: 103 mmol/L (ref 101–111)
Creatinine, Ser: 0.66 mg/dL (ref 0.44–1.00)
GFR calc Af Amer: >60 mL/min (ref >60)
GFR calc non Af Amer: >60 mL/min (ref >60)
GLUCOSE: 85 mg/dL (ref 65–99)
Potassium: 3.8 mmol/L (ref 3.5–5.1)
SODIUM: 134 mmol/L — AB (ref 135–145)

## 2017-03-31 LAB — CBC
HCT: 38.1 % (ref 36.0–46.0)
HEMOGLOBIN: 12.4 g/dL (ref 12.0–15.0)
MCH: 29.3 pg (ref 26.0–34.0)
MCHC: 32.5 g/dL (ref 30.0–36.0)
MCV: 90.1 fL (ref 78.0–100.0)
Platelets: 247 10*3/uL (ref 150–400)
RBC: 4.23 MIL/uL (ref 3.87–5.11)
RDW: 15.1 % (ref 11.5–15.5)
WBC: 9 10*3/uL (ref 4.0–10.5)

## 2017-03-31 LAB — MAGNESIUM: MAGNESIUM: 0.4 mg/dL — AB (ref 1.7–2.4)

## 2017-03-31 LAB — TSH: TSH: 3.494 u[IU]/mL (ref 0.350–4.500)

## 2017-03-31 MED ORDER — MAGNESIUM SULFATE 4 GM/100ML IV SOLN
4.0000 g | Freq: Once | INTRAVENOUS | Status: AC
  Administered 2017-03-31: 4 g via INTRAVENOUS
  Filled 2017-03-31 (×2): qty 100

## 2017-03-31 NOTE — Progress Notes (Signed)
Pt is alert and oriented with leg unsteadiness. With improved dizziness. Called MRI schedule will be later this evening.

## 2017-03-31 NOTE — Progress Notes (Addendum)
PROGRESS NOTE    Teresa Gonzalez  IOX:[id number] DOB: September 24, 1922 DOA: 03/30/2017 PCP: Shon Baton, MD   Outpatient Specialists:     Brief Narrative:  Teresa Gonzalez is a 83 y.o. female with medical history significant for hypothyroidism, depression, anxiety, paroxysmal atrial fibrillation on Xarelto, and recurrent UTIs, now presenting to the emergency department at the direction of her PCP for evaluation of vertigo with marked generalized weakness, nausea, and a couple episodes of vomiting. Patient reports suffering from recurrent UTIs for the past few months and just completed a course of ciprofloxacin today. Aside from urinary urgency and frequency, she has also developed progressive generalized weakness over the past several months, but her condition worsened acutely on 03/27/2017 when she developed a sensation as though the room was spinning, worse with certain movements, and associated with nausea and nonbloody nonbilious vomiting, persisting through today. She has had a loss of appetite over this interval as well. She denies any headache, change in vision or hearing, or focal numbness or weakness. She denies any recent fevers or chills and denies any significant dyspnea or cough. There is no chest pain or palpitations. Daughter at bedside reports that patient was quite disoriented this morning, noting this is unusual. Of note, she reports developing bilateral lower extremity edema and exertional dyspnea a few weeks ago that resolved with Lasix at home. She reports experiencing a similar vertiginous symptoms in recent months and has been taking antiemetics and meclizine at home with intermittent relief. There has not been a head CT or MRI during this interval.   Assessment & Plan:   Principal Problem:   Generalized weakness Active Problems:   Atrial fibrillation (HCC)   Hyponatremia   Chronic diarrhea   Hypocalcemia   Vertigo   General weakness   History of recurrent UTIs   Vertigo  -  Pt presents with dizziness, N/V, transient disorientation, generalized weakness  - Just completed a course of Cipro for UTI, which can cause similar sxs - MRI brain to exclude a central etiology -replete Mg and calcium agressively   Hyponatremia - improved with hydration  Paroxysmal atrial fibrillation - CHADS-VASc is at least 59 (age x2, gender, HTN)  - Continue Xarelto and metoprolol    Hypocalcemia  - replete and recheck in AM  Severe hypomagnesium -IV repletion of 4grams -recheck in AM -patient has chronic diarrhea due to prior radiation treatment  Recurrent UTI's  - Pt has persistent urinary urgency and frequency despite just completing a course of Cipro, her 3rd round of abx for UTI in last few mos  - UA not particularly suggestive of acute infection, will send for culture   Has caregivers at home along with advance PT/RN  DVT prophylaxis:  Fully anticoagulated  Code Status: DNR   Family Communication: Daughter at bedside  Disposition Plan:  Home once electrolytes   Consultants:      Subjective: Still with some dizziness when moving around-- learning to do [employer]  Objective: Vitals:   03/30/17 2300 03/30/17 2330 03/31/17 0215 03/31/17 0936  BP: 123/69 131/77 (!) 141/84 138/68  Pulse: 87 97 91 92  Resp: (!) 25 (!) 32  18  Temp:   98.9 F (37.2 C) 98 F (36.7 C)  TempSrc:   Oral Oral  SpO2: 94% 94% 95% 95%  Weight:   45.9 kg (101 lb 3.2 oz)   Height:   5\' 5"  (1.651 m)     Intake/Output Summary (Last 24 hours) at 03/31/17 1145 Last data  filed at 03/31/17 1121  Gross per 24 hour  Intake             1600 ml  Output              900 ml  Net              700 ml   Filed Weights   03/31/17 0215  Weight: 45.9 kg (101 lb 3.2 oz)    Examination:  General exam: NAD- appears stated age Respiratory system: Clear, no wheezing Cardiovascular system: S1 & S2 heard, RRR. No JVD, murmurs, rubs, gallops or clicks. No pedal  edema. Gastrointestinal system: Abdomen is nondistended, soft and nontender. No organomegaly or masses felt. Normal bowel sounds heard. Central nervous system: A+Ox3 Extremities: Symmetric 5 x 5 power. Skin: senile changes Psychiatry: pleasant and cooperative    Data Reviewed: I have personally reviewed following labs and imaging studies  CBC:  Recent Labs Lab 03/30/17 1902 03/31/17 0341  WBC 10.0 9.0  NEUTROABS 7.5  --   HGB 13.2 12.4  HCT 39.6 38.1  MCV 87.8 90.1  PLT 320 270   Basic Metabolic Panel:  Recent Labs Lab 03/30/17 1902 03/31/17 0341  NA 131* 134*  K 4.5 3.8  CL 94* 103  CO2 23 19*  GLUCOSE 133* 85  BUN 13 10  CREATININE 0.93 0.66  CALCIUM 6.5* 6.4*  MG  --  0.4*   GFR: Estimated Creatinine Clearance: 31.8 mL/min (by C-G formula based on SCr of 0.66 mg/dL). Liver Function Tests: No results for input(s): AST, ALT, ALKPHOS, BILITOT, PROT, ALBUMIN in the last 168 hours. No results for input(s): LIPASE, AMYLASE in the last 168 hours. No results for input(s): AMMONIA in the last 168 hours. Coagulation Profile: No results for input(s): INR, PROTIME in the last 168 hours. Cardiac Enzymes: No results for input(s): CKTOTAL, CKMB, CKMBINDEX, TROPONINI in the last 168 hours. BNP (last 3 results) No results for input(s): PROBNP in the last 8760 hours. HbA1C: No results for input(s): HGBA1C in the last 72 hours. CBG: No results for input(s): GLUCAP in the last 168 hours. Lipid Profile: No results for input(s): CHOL, HDL, LDLCALC, TRIG, CHOLHDL, LDLDIRECT in the last 72 hours. Thyroid Function Tests:  Recent Labs  03/31/17 0341  TSH 3.494   Anemia Panel: No results for input(s): VITAMINB12, FOLATE, FERRITIN, TIBC, IRON, RETICCTPCT in the last 72 hours. Urine analysis:    Component Value Date/Time   COLORURINE YELLOW 03/30/2017 2051   APPEARANCEUR HAZY (A) 03/30/2017 2051   LABSPEC 1.011 03/30/2017 2051   PHURINE 5.0 03/30/2017 2051   GLUCOSEU  NEGATIVE 03/30/2017 2051   HGBUR NEGATIVE 03/30/2017 2051   BILIRUBINUR NEGATIVE 03/30/2017 2051   KETONESUR 5 (A) 03/30/2017 2051   PROTEINUR NEGATIVE 03/30/2017 2051   UROBILINOGEN 0.2 08/01/2015 0125   NITRITE NEGATIVE 03/30/2017 2051   LEUKOCYTESUR TRACE (A) 03/30/2017 2051     )No results found for this or any previous visit (from the past 240 hour(s)).    Anti-infectives    None       Radiology Studies: No results found.      Scheduled Meds: . calcium carbonate  1,250 mg Oral TID WC  . cholecalciferol  1,000 Units Oral Daily  . levothyroxine  50 mcg Oral QAC breakfast  . metoprolol tartrate  25 mg Oral BID  . mirtazapine  7.5 mg Oral QHS  . multivitamin with minerals  1 tablet Oral Daily  . pantoprazole  40 mg Oral  Daily  . raloxifene  60 mg Oral Daily  . rivaroxaban  10 mg Oral Daily  . sodium chloride flush  3 mL Intravenous Q12H   Continuous Infusions:   LOS: 0 days    Time spent: 35 min    Olds, DO [facility] Pager [phone]  If 7PM-7AM, please contact night-coverage [email or web address] Password [email or web address] 03/31/2017, 11:45 AM

## 2017-03-31 NOTE — Evaluation (Signed)
Physical Therapy Evaluation Patient Details Name: Teresa Gonzalez MRN: [id number] DOB: 06/03/1922 Today's Date: 03/31/2017   History of Present Illness  Teresa Gonzalez is a 83 y.o. female with medical history significant for hypothyroidism, depression, anxiety, paroxysmal atrial fibrillation on Xarelto, and recurrent UTIs, now presenting to the emergency department at the direction of her PCP for evaluation of vertigo with marked generalized weakness, nausea, and a couple episodes of vomiting. Patient reports suffering from recurrent UTIs for the past few months and just completed a course of ciprofloxacin today. Aside from urinary urgency and frequency, she has also developed progressive generalized weakness over the past several months, but her condition worsened acutely on 03/27/2017 when she developed a sensation as though the room was spinning, worse with certain movements, and associated with nausea and nonbloody nonbilious vomiting, persisting through today. PMH:  depression, peripheral neuropathy  Clinical Impression  Pt admitted with above diagnosis. Pt currently with functional limitations due to the deficits listed below (see PT Problem List). Pt was able to tolerate Epley maneuver for right BPPV today.  Still slightly dizzy at end of treatment but much better than yesterday with pt able to transfer to 3N1 with mod assist.  Pt has 24 hour caregivers.  Daughter educated that vestibular therapy can be continue via HHPT.  Will follow acutely and reassess pt in am is still in hospital.   Pt will benefit from skilled PT to increase their independence and safety with mobility to allow discharge to the venue listed below.      Follow Up Recommendations Home health PT;Supervision/Assistance - 24 hour (for vestibular rehab;HHOT)    Equipment Recommendations  None recommended by PT    Recommendations for Other Services       Precautions / Restrictions Precautions Precautions:  Fall Restrictions Weight Bearing Restrictions: No      Mobility  Bed Mobility Overal bed mobility: Needs Assistance Bed Mobility: Rolling;Sidelying to Sit Rolling: Mod assist Sidelying to sit: Mod assist       General bed mobility comments: Pt mod assist due to decr processing for information.  Upon arrival, pt with extenal catheter but tubing not connected to suction and pt and bed linens wet.  Placed pt on bedpan and changed all linens.  Assessed for BPPV with positive right BPPV posterior canal therefore treated with canalith repositioning maneuver with pt reporting some residual dizziness after.   Transfers Overall transfer level: Needs assistance Equipment used: Rolling walker (2 wheeled) Transfers: Sit to/from [facility] Sit to Stand: Mod assist Stand pivot transfers: Mod assist       General transfer comment:  Had to use bathroom after Epley maneuver therefore stand pivot to 3N1 with mod assist and rW with posterior lean. Total assist to clean pt in standing.   Ambulation/Gait             General Gait Details: Pt unable at this time.  Stairs            Wheelchair Mobility    Modified Rankin (Stroke Patients Only)       Balance Overall balance assessment: Needs assistance Sitting-balance support: No upper extremity supported;Feet supported Sitting balance-Leahy Scale: Fair   Postural control: Posterior lean Standing balance support: Bilateral upper extremity supported;During functional activity Standing balance-Leahy Scale: Poor Standing balance comment: relies on RW and mod assist external support for balance  Pertinent Vitals/Pain Pain Assessment: Faces Faces Pain Scale: Hurts even more Pain Location: right neck Pain Descriptors / Indicators: Grimacing;Guarding;Sore Pain Intervention(s): Limited activity within patient's tolerance;Monitored during session;Repositioned    Home Living  Family/patient expects to be discharged to:: Private residence Living Arrangements: Children;Non-relatives/Friends Available Help at Discharge: Personal care attendant;Available 24 hours/day (husband and 24 hour caregivers) Type of Home: House Home Access: Level entry;Ramped entrance     Home Layout: One level Home Equipment: Programme researcher, broadcasting/film/video - 4 wheels;Shower seat - built in;Hand held shower head;Grab bars - tub/shower;Grab bars - toilet;Cane - single point;Wheelchair - manual (gait belt)      Prior Function Level of Independence: Needs assistance   Gait / Transfers Assistance Needed: Was using Rollator for gait for household distances needing min to mod assist per daughter; wheelchair outdoors  ADL's / Homemaking Assistance Needed: requires assistance for bathing and dressing. Has someone there to assist as needed        Hand Dominance   Dominant Hand: Right    Extremity/Trunk Assessment   Upper Extremity Assessment Upper Extremity Assessment: Defer to OT evaluation    Lower Extremity Assessment Lower Extremity Assessment: Generalized weakness    Cervical / Trunk Assessment Cervical / Trunk Assessment: Kyphotic  Communication   Communication: HOH  Cognition Arousal/Alertness: Awake/alert Behavior During Therapy: Anxious Overall Cognitive Status: History of cognitive impairments - at baseline                                        General Comments General comments (skin integrity, edema, etc.): Daughter present and discussed BPPV and treatment options.  Given that pt has 24 hour caregivers, daughter is comfortable with pt getting HHPT per daughter today.  Pt did still have a little residual dizziness therefore will need further treatment but MD, pt and daughter can determine if pt can receive f/u at home and go home today.     Exercises     Assessment/Plan    PT Assessment Patient needs continued PT services  PT Problem List Decreased  strength;Decreased activity tolerance;Decreased balance;Decreased mobility;Decreased cognition;Decreased knowledge of use of DME;Decreased safety awareness;Decreased knowledge of precautions;Pain (dizziness)       PT Treatment Interventions DME instruction;Gait training;Functional mobility training;Therapeutic activities;Therapeutic exercise;Balance training;Patient/family education;Wheelchair mobility training (vestibular rehab/BPPV treatment)    PT Goals (Current goals can be found in the Care Plan section)  Acute Rehab PT Goals Patient Stated Goal: to not be dizzy and go home PT Goal Formulation: With patient Time For Goal Achievement: 04/14/17 Potential to Achieve Goals: Good    Frequency Min 3X/week   Barriers to discharge        Co-evaluation               AM-PAC PT "6 Clicks" Daily Activity  Outcome Measure Difficulty turning over in bed (including adjusting bedclothes, sheets and blankets)?: Total Difficulty moving from lying on back to sitting on the side of the bed? : Total Difficulty sitting down on and standing up from a chair with arms (e.g., wheelchair, bedside commode, etc,.)?: A Lot Help needed moving to and from a bed to chair (including a wheelchair)?: A Lot Help needed walking in hospital room?: Total Help needed climbing 3-5 steps with a railing? : Total 6 Click Score: 8    End of Session Equipment Utilized During Treatment: Gait belt Activity Tolerance: Patient tolerated treatment well;Patient limited by fatigue;Patient limited  by pain;Other (comment) (neck pain with positioning) Patient left: in bed;with call bell/phone within reach;with bed alarm set;with family/visitor present (in chair position) Nurse Communication: Mobility status (vestibular rehab) PT Visit Diagnosis: Dizziness and giddiness (R42);BPPV;Pain;History of falling (Z91.81);Muscle weakness (generalized) (M62.81);Unsteadiness on feet (R26.81) BPPV - Right/Left : Right Pain - Right/Left:  Right Pain - part of body:  (neck)    Time: 0175-1025 PT Time Calculation (min) (ACUTE ONLY): 54 min   Charges:   PT Evaluation $PT Eval Moderate Complexity: 1 Procedure PT Treatments $Therapeutic Activity: 8-22 mins $Self Care/Home Management: 8-22 $Canalith Rep Proc: 8-22 mins   PT G Codes:   PT G-Codes **NOT FOR INPATIENT CLASS** Functional Assessment Tool Used: AM-PAC 6 Clicks Basic Mobility Functional Limitation: Changing and maintaining body position Changing and Maintaining Body Position Current Status (E5277): At least 40 percent but less than 60 percent impaired, limited or restricted Changing and Maintaining Body Position Goal Status (O2423): At least 20 percent but less than 40 percent impaired, limited or restricted    [place] [phone] (pager)   Denice Paradise 03/31/2017, 11:11 AM

## 2017-03-31 NOTE — Consult Note (Signed)
   [facility] Inpatient Consult   03/31/2017  [name] 07/24/1922 [id number]   Patient was assessed for re-hospitalization with [facility].  Patient is Teresa Gonzalez. Papa is a 83 year old admitted with hyperthyroidism, depression , anxiety, with proximal AF with new HX of vertigo.  Met with the patient and her daughter to explain [facility] services.  The patient endorses Dr. Shon Baton as her primary care provider, [place] [facility] services with her primary provider and [employer].  Daughter states, "she already has [facility] and I think could get too confusing with too many services." Patient did accept the brochure along with the 24 hour nurse advise line with contact information.  Patient declined services at this time.  Encouraged to contact the office if needs arise for services.  For questions, please contact:  Natividad Brood, RN BSN [facility] Liaison  [phone] business mobile phone Toll free office [phone]

## 2017-03-31 NOTE — Progress Notes (Signed)
CRITICAL VALUE ALERT  Critical Value:  Ca=6.4,  Mg= 0.4  Date & Time Notied:  03/31/17 05:28  Provider Notified: Tylene Fantasia  Orders Received/Actions taken: awaiting

## 2017-03-31 NOTE — Progress Notes (Signed)
[facility]  Patient Status: Active (receiving services up to time of hospitalization)  [facility] is providing the following services: RN and PT  If patient discharges after hours, please call [phone].   Teresa Gonzalez 03/31/2017, 10:37 AM

## 2017-03-31 NOTE — Progress Notes (Signed)
Pt stated that she already got her bath

## 2017-04-01 DIAGNOSIS — R531 Weakness: Secondary | ICD-10-CM

## 2017-04-01 LAB — CBC
HCT: 37.4 % (ref 36.0–46.0)
Hemoglobin: 12.4 g/dL (ref 12.0–15.0)
MCH: 29.7 pg (ref 26.0–34.0)
MCHC: 33.2 g/dL (ref 30.0–36.0)
MCV: 89.7 fL (ref 78.0–100.0)
Platelets: 251 10*3/uL (ref 150–400)
RBC: 4.17 MIL/uL (ref 3.87–5.11)
RDW: 14.9 % (ref 11.5–15.5)
WBC: 8.5 10*3/uL (ref 4.0–10.5)

## 2017-04-01 LAB — BASIC METABOLIC PANEL
Anion gap: 10 (ref 5–15)
BUN: 6 mg/dL (ref 6–20)
CO2: 23 mmol/L (ref 22–32)
CREATININE: 0.57 mg/dL (ref 0.44–1.00)
Calcium: 7.1 mg/dL — ABNORMAL LOW (ref 8.9–10.3)
Chloride: 106 mmol/L (ref 101–111)
Glucose, Bld: 75 mg/dL (ref 65–99)
POTASSIUM: 3.2 mmol/L — AB (ref 3.5–5.1)
SODIUM: 139 mmol/L (ref 135–145)

## 2017-04-01 LAB — MAGNESIUM: Magnesium: 1.7 mg/dL (ref 1.7–2.4)

## 2017-04-01 LAB — URINE CULTURE

## 2017-04-01 LAB — CALCIUM, IONIZED: CALCIUM, IONIZED, SERUM: 3.8 mg/dL — AB (ref 4.5–5.6)

## 2017-04-01 MED ORDER — MAGNESIUM OXIDE 400 (241.3 MG) MG PO TABS: 400.0000 mg | ORAL_TABLET | Freq: Every day | ORAL | 0 refills | Status: AC

## 2017-04-01 MED ORDER — POTASSIUM CHLORIDE CRYS ER 20 MEQ PO TBCR: 40.0000 meq | EXTENDED_RELEASE_TABLET | Freq: Every day | ORAL | 0 refills | Status: DC

## 2017-04-01 MED ORDER — POTASSIUM CHLORIDE CRYS ER 20 MEQ PO TBCR
40.0000 meq | EXTENDED_RELEASE_TABLET | Freq: Once | ORAL | Status: AC
  Administered 2017-04-01: 40 meq via ORAL
  Filled 2017-04-01: qty 2

## 2017-04-01 NOTE — Progress Notes (Signed)
Physical Therapy Treatment Patient Details Name: Teresa Gonzalez MRN: [id number] DOB: Apr 25, 1922 Today's Date: 04/01/2017    History of Present Illness Teresa Gonzalez is a 83 y.o. female with medical history significant for hypothyroidism, depression, anxiety, paroxysmal atrial fibrillation on Xarelto, and recurrent UTIs, now presenting to the emergency department at the direction of her PCP for evaluation of vertigo with marked generalized weakness, nausea, and a couple episodes of vomiting. Patient reports suffering from recurrent UTIs for the past few months and just completed a course of ciprofloxacin today. Aside from urinary urgency and frequency, she has also developed progressive generalized weakness over the past several months, but her condition worsened acutely on 03/27/2017 when she developed a sensation as though the room was spinning, worse with certain movements, and associated with nausea and nonbloody nonbilious vomiting, persisting through today. PMH:  depression, peripheral neuropathy    PT Comments    Pt admitted with above diagnosis. Pt currently with functional limitations due to balance and endurance deficits. Pt was able to ambulate with RW with min to mod assist.  HAs 24 hour caregivers and daughter and pt feel good to go home.  Dizziness almost gone per pt after the second treatment for right BPPV today.  Should be able to go home and follow up with HHPT.  Pt will benefit from skilled PT to increase their independence and safety with mobility to allow discharge to the venue listed below.     Follow Up Recommendations  Home health PT;Supervision/Assistance - 24 hour (for vestibular rehab;HHOT)     Equipment Recommendations  None recommended by PT    Recommendations for Other Services       Precautions / Restrictions Precautions Precautions: Fall Restrictions Weight Bearing Restrictions: No    Mobility  Bed Mobility Overal bed mobility: Needs Assistance Bed  Mobility: Rolling;Sidelying to Sit Rolling: Mod assist Sidelying to sit: Mod assist       General bed mobility comments: Pt mod assist due to decr processing for information.  Upon arrival, pt with external catheter and removed.  Reassessed for BPPV with positive right BPPV posterior canal therefore treated with canalith repositioning maneuver with pt reporting no dizziness after.   Transfers Overall transfer level: Needs assistance Equipment used: Rolling walker (2 wheeled) Transfers: Sit to/from [facility] Sit to Stand: Mod assist;Min assist;+2 safety/equipment         General transfer comment: Pt with slight posteriorl lean needing steadying assist.    Ambulation/Gait Ambulation/Gait assistance: Min assist;+2 safety/equipment Ambulation Distance (Feet): 125 Feet Assistive device: Rolling walker (2 wheeled) Gait Pattern/deviations: Step-through pattern;Decreased stride length;Trunk flexed;Leaning posteriorly;Drifts right/left   Gait velocity interpretation: Below normal speed for age/gender General Gait Details: Pt able to ambulate with RW with daughter commenting that pt seems to be walking like she did at baseline.  Occcasionally, pt needing steadying assist with ambulation as she has unsteady gait at times.  Pt has a gait belt and has 24 hour caregivers..    Stairs            Wheelchair Mobility    Modified Rankin (Stroke Patients Only)       Balance Overall balance assessment: Needs assistance Sitting-balance support: No upper extremity supported;Feet supported Sitting balance-Leahy Scale: Fair   Postural control: Posterior lean Standing balance support: Bilateral upper extremity supported;During functional activity Standing balance-Leahy Scale: Poor Standing balance comment: relies on RW and mod assist external support for balance  Cognition Arousal/Alertness: Awake/alert Behavior During Therapy:  Anxious Overall Cognitive Status: History of cognitive impairments - at baseline                                        Exercises Other Exercises Other Exercises: Layla Maw Daroff exercises if pt needs at home.     General Comments        Pertinent Vitals/Pain Pain Assessment: Faces Faces Pain Scale: Hurts even more Pain Location: right neck Pain Descriptors / Indicators: Grimacing;Guarding;Sore Pain Intervention(s): Limited activity within patient's tolerance;Monitored during session;Repositioned;Heat applied (to right neck)    Home Living                      Prior Function            PT Goals (current goals can now be found in the care plan section) Progress towards PT goals: Progressing toward goals    Frequency    Min 3X/week      PT Plan Current plan remains appropriate    Co-evaluation              AM-PAC PT "6 Clicks" Daily Activity  Outcome Measure  Difficulty turning over in bed (including adjusting bedclothes, sheets and blankets)?: A Little Difficulty moving from lying on back to sitting on the side of the bed? : A Lot Difficulty sitting down on and standing up from a chair with arms (e.g., wheelchair, bedside commode, etc,.)?: A Lot Help needed moving to and from a bed to chair (including a wheelchair)?: A Lot Help needed walking in hospital room?: A Lot Help needed climbing 3-5 steps with a railing? : Total 6 Click Score: 12    End of Session Equipment Utilized During Treatment: Gait belt Activity Tolerance: Patient tolerated treatment well;Patient limited by fatigue;Patient limited by pain;Other (comment) (neck pain with positioning) Patient left: with call bell/phone within reach;with family/visitor present;in chair;with chair alarm set (in chair position) Nurse Communication: Mobility status (vestibular rehab) PT Visit Diagnosis: Dizziness and giddiness (R42);BPPV;Pain;History of falling (Z91.81);Muscle  weakness (generalized) (M62.81);Unsteadiness on feet (R26.81) BPPV - Right/Left : Right Pain - Right/Left: Right Pain - part of body:  (neck)     Time: 6962-9528 PT Time Calculation (min) (ACUTE ONLY): 31 min  Charges:  $Gait Training: 8-22 mins $Canalith Rep Proc: 8-22 mins                    G Codes:       Efe Fazzino,PT Acute Rehabilitation [phone] (pager)    Denice Paradise 04/01/2017, 12:19 PM

## 2017-04-01 NOTE — Discharge Summary (Signed)
Physician Discharge Summary  Teresa Gonzalez FHL:[id number] DOB: December 14, 1921 DOA: 03/30/2017  PCP: Shon Baton, MD  Admit date: 03/30/2017 Discharge date: 04/01/2017  Time spent: 35 minutes  Recommendations for Outpatient Follow-up:  1. Check cmet and mag in 1 week 2. Careful with use of lasix as can cause vol depletion-suggest tailoring dose on hospital f/u 3. Continue Meclizine 4 vertigo 4. Hom Vestib Rx given for OP Vestib Rehab 5. Suggest Risk/benefit suppressive UTI therapy? 6. Follow Urine culture--not inclined to treat as no fever, dyusria or systemic signs infection   Discharge Diagnoses:  Principal Problem:   Generalized weakness Active Problems:   Atrial fibrillation (HCC)   Hyponatremia   Chronic diarrhea   Hypocalcemia   Vertigo   General weakness   History of recurrent UTIs   Discharge Condition: good  Diet recommendation: hh  Filed Weights   03/31/17 0215 04/01/17 0511  Weight: 45.9 kg (101 lb 3.2 oz) 46.6 kg (102 lb 11.2 oz)   83 y.o. Hypothyroidism,  depression, anxiety,  paroxysmal atrial fibrillation on Xarelto recurrent UTIs Ca s/p surgery with complication of Small bowel resection Admit from PCP for evaluation of vertigo with marked generalized weakness, nausea, and a couple episodes of vomiting.   Patient reports suffering from recurrent UTIs for the past few months and just completed a course of ciprofloxacin today. Aside from urinary urgency and frequency, she has also developed progressive generalized weakness over the past several months, but her condition worsened acutely on 03/27/2017 when she developed a sensation as though the room was spinning, worse with certain movements, and associated with nausea and nonbloody nonbilious vomiting  She has had a loss of appetite over this interval as well. She denies any headache, change in vision or hearing, or focal numbness or weakness. She denies any recent fevers or chills and denies any significant  dyspnea or cough. There is no chest pain or palpitations.   Was disoriented on admit, noting this is unusual.  recently Rx  For edema and exertional dyspnea a few weeks ago that resolved with Lasix at home.   She reports experiencing a similar vertiginous symptoms in recent months and has been taking antiemetics and meclizine at home with intermittent relief.   There has not been a head CT or MRI during this interval.   Hospital Course:  Vertigo  - Pt presents with dizziness, N/V, transient disorientation, generalized weakness  - Just completed a course of Cipro for UTI, which can cause similar sxs - MRI brain neg this admit for new CVA -replete Mg and given Rx Mag Ox 400 on d/c-labs as above and calcium agressively    Hyponatremia - improved with hydration -consider holding diuretics as OP   Paroxysmal atrial fibrillation - CHADS-VASc is at least 23 (age x2, gender, HTN)  - Continue Xarelto and metoprolol     Hypocalcemia  -  recheck in as OP, was 7.1 on d/c but corrected for albumin would be higher   Severe hypomagnesium -IV repletion of 4grams -see above, on d/c was 1.8 -patient has chronic Radiation therapy related diarrhea     Recurrent UTI's  - Pt has persistent urinary urgency and frequency despite just completing a course of Cipro, her 3rd round of abx for UTI in last few mos  - UA not particularly suggestive of acute infection, follow as per PCP    Has caregivers at home along with advance PT/RN    Discharge Exam: Vitals:   04/01/17 0608 04/01/17 0927  BP:  134/67 (!) 130/53  Pulse: 75 81  Resp: 16   Temp: 97 F (36.1 C)     General: alert pleasant arcus senilis, no ict no pallor Throat soft and supple No jvd No bruit Cardiovascular: s1 s2 no m Respiratory: clear no added sound reflexes brisk smile symm, vision attempted Power 5/5 major M groups  Discharge Instructions    Current Discharge Medication List    START taking these medications    Details  magnesium oxide (MAG-OX) 400 (241.3 Mg) MG tablet Take 1 tablet (400 mg total) by mouth daily. Qty: 30 tablet, Refills: 0      CONTINUE these medications which have CHANGED   Details  potassium chloride SA (K-DUR,KLOR-CON) 20 MEQ tablet Take 2 tablets (40 mEq total) by mouth daily. Qty: 30 tablet, Refills: 0      CONTINUE these medications which have NOT CHANGED   Details  Calcium Carbonate (CALTRATE 600 PO) Take 600 mg by mouth 3 (three) times daily.     Cholecalciferol (VITAMIN D3) 1000 units CAPS Take 1,000 Units by mouth daily.    furosemide (LASIX) 20 MG tablet Take 20 mg by mouth daily.    gabapentin (NEURONTIN) 100 MG capsule Take 100 mg by mouth 2 (two) times daily as needed (nerve pain).    loperamide (IMODIUM) 2 MG capsule Take 1 capsule (2 mg total) by mouth 5 (five) times daily as needed for diarrhea or loose stools. Qty: 30 capsule, Refills: 0    LORazepam (ATIVAN) 0.5 MG tablet Take 0.5 mg by mouth daily as needed for anxiety. Refills: 0    meclizine (ANTIVERT) 12.5 MG tablet Take 12.5 mg by mouth 3 (three) times daily as needed for dizziness.    methocarbamol (ROBAXIN) 500 MG tablet Take 500 mg by mouth every 8 (eight) hours as needed for muscle spasms.    metoprolol tartrate (LOPRESSOR) 25 MG tablet Take 1 tablet (25 mg total) by mouth 2 (two) times daily. Qty: 60 tablet, Refills: 0    mirtazapine (REMERON) 15 MG tablet Take 7.5 mg by mouth at bedtime.    Multiple Vitamins-Minerals (MULTIVITAMIN WITH MINERALS) tablet Take 1 tablet by mouth daily.    omeprazole (PRILOSEC) 40 MG capsule Take 40 mg by mouth daily.    raloxifene (EVISTA) 60 MG tablet Take 60 mg by mouth daily.    rivaroxaban (XARELTO) 10 MG TABS tablet Take 10 mg by mouth daily.    SYNTHROID 50 MCG tablet Take 50 mcg by mouth daily. Refills: 2    VITAMIN B1-B12 IJ Inject 1 mL into the skin every 21 ( twenty-one) days.     Vitamin D, Ergocalciferol, (DRISDOL) 50000 UNITS CAPS Take  50,000 Units by mouth See admin instructions. Takes on Monday, Wednesday, Friday and Sunday        Follow-up Information    [facility]   Why:  [place] Contact information: [address] [place]  [address] [phone]            The results of significant diagnostics from this hospitalization (including imaging, microbiology, ancillary and laboratory) are listed below for reference.    Significant Diagnostic Studies: Mr Brain Wo Contrast  Result Date: 04/01/2017 CLINICAL DATA:  Initial evaluation for acute vertigo. EXAM: MRI HEAD WITHOUT CONTRAST TECHNIQUE: Multiplanar, multiecho pulse sequences of the brain and surrounding structures were obtained without intravenous contrast. COMPARISON:  Comparison made with prior CT from 05/25/2016. FINDINGS: Brain: Diffuse prominence of the CSF containing spaces  is compatible with generalized cerebral atrophy. Patchy and confluent T2/FLAIR hyperintensity within the periventricular and deep white matter both cerebral hemispheres most compatible chronic small vessel ischemic disease. Overall, changes are mild to moderate nature. Few scattered superimposed remote bilateral cerebellar infarcts noted, left larger than right. No abnormal foci of restricted diffusion to suggest acute or subacute ischemia. Gray-white matter differentiation maintained. No other areas of remote cortical infarction. No evidence for acute or chronic intracranial hemorrhage. No mass lesion, midline shift or mass effect. Diffuse ventricular prominence most like related to global parenchymal volume loss of hydrocephalus. No extra-axial fluid collection. Major dural sinuses are grossly patent. Pituitary gland suprasellar region within normal limits. Midline structures intact. Vascular: Major intracranial vascular flow voids are maintained. Skull and upper cervical spine: Degenerative thickening present at the tectorial  membrane. Craniocervical junction otherwise unremarkable. No significant stenosis. Visualized upper cervical spine normal. Bone marrow signal intensity within normal limits. No scalp soft tissue abnormality. Sinuses/Orbits: Globes and orbital soft tissues within normal limits. Patient status post lens extraction bilaterally. Scattered mucosal thickening throughout the ethmoidal air cells and maxillary sinuses. No air-fluid level to suggest acute sinusitis. Trace bilateral mastoid effusions, of doubtful significance. Inner ear structures normal. IMPRESSION: 1. No acute intracranial process. 2. Few scattered small remote bilateral cerebellar infarcts. 3. Mild to moderate age-related cerebral atrophy with chronic small vessel ischemic disease. Electronically Signed   By: Jeannine Boga M.D.   On: 04/01/2017 00:01    Microbiology: No results found for this or any previous visit (from the past 240 hour(s)).   Labs: Basic Metabolic Panel:  Recent Labs Lab 03/30/17 1902 03/31/17 0341 04/01/17 0519  NA 131* 134* 139  K 4.5 3.8 3.2*  CL 94* 103 106  CO2 23 19* 23  GLUCOSE 133* 85 75  BUN 13 10 6   CREATININE 0.93 0.66 0.57  CALCIUM 6.5* 6.4* 7.1*  MG  --  0.4* 1.7   Liver Function Tests: No results for input(s): AST, ALT, ALKPHOS, BILITOT, PROT, ALBUMIN in the last 168 hours. No results for input(s): LIPASE, AMYLASE in the last 168 hours. No results for input(s): AMMONIA in the last 168 hours. CBC:  Recent Labs Lab 03/30/17 1902 03/31/17 0341 04/01/17 0519  WBC 10.0 9.0 8.5  NEUTROABS 7.5  --   --   HGB 13.2 12.4 12.4  HCT 39.6 38.1 37.4  MCV 87.8 90.1 89.7  PLT 320 247 251   Cardiac Enzymes: No results for input(s): CKTOTAL, CKMB, CKMBINDEX, TROPONINI in the last 168 hours. BNP: BNP (last 3 results) No results for input(s): BNP in the last 8760 hours.  ProBNP (last 3 results) No results for input(s): PROBNP in the last 8760 hours.  CBG: No results for input(s): GLUCAP  in the last 168 hours.     SignedNita Sells MD   [facility] 04/01/2017, 9:59 AM

## 2017-04-05 DIAGNOSIS — M810 Age-related osteoporosis without current pathological fracture: Secondary | ICD-10-CM | POA: Diagnosis not present

## 2017-04-05 DIAGNOSIS — I4891 Unspecified atrial fibrillation: Secondary | ICD-10-CM | POA: Diagnosis not present

## 2017-04-05 DIAGNOSIS — D510 Vitamin B12 deficiency anemia due to intrinsic factor deficiency: Secondary | ICD-10-CM | POA: Diagnosis not present

## 2017-04-05 DIAGNOSIS — I10 Essential (primary) hypertension: Secondary | ICD-10-CM | POA: Diagnosis not present

## 2017-04-05 DIAGNOSIS — E559 Vitamin D deficiency, unspecified: Secondary | ICD-10-CM | POA: Diagnosis not present

## 2017-04-05 DIAGNOSIS — E782 Mixed hyperlipidemia: Secondary | ICD-10-CM | POA: Diagnosis not present

## 2017-04-05 DIAGNOSIS — G629 Polyneuropathy, unspecified: Secondary | ICD-10-CM | POA: Diagnosis not present

## 2017-04-05 DIAGNOSIS — M5440 Lumbago with sciatica, unspecified side: Secondary | ICD-10-CM | POA: Diagnosis not present

## 2017-04-05 DIAGNOSIS — I447 Left bundle-branch block, unspecified: Secondary | ICD-10-CM | POA: Diagnosis not present

## 2017-04-06 DIAGNOSIS — E782 Mixed hyperlipidemia: Secondary | ICD-10-CM | POA: Diagnosis not present

## 2017-04-06 DIAGNOSIS — I447 Left bundle-branch block, unspecified: Secondary | ICD-10-CM | POA: Diagnosis not present

## 2017-04-06 DIAGNOSIS — I10 Essential (primary) hypertension: Secondary | ICD-10-CM | POA: Diagnosis not present

## 2017-04-06 DIAGNOSIS — M810 Age-related osteoporosis without current pathological fracture: Secondary | ICD-10-CM | POA: Diagnosis not present

## 2017-04-06 DIAGNOSIS — I4891 Unspecified atrial fibrillation: Secondary | ICD-10-CM | POA: Diagnosis not present

## 2017-04-06 DIAGNOSIS — M5440 Lumbago with sciatica, unspecified side: Secondary | ICD-10-CM | POA: Diagnosis not present

## 2017-04-06 DIAGNOSIS — G629 Polyneuropathy, unspecified: Secondary | ICD-10-CM | POA: Diagnosis not present

## 2017-04-06 DIAGNOSIS — D510 Vitamin B12 deficiency anemia due to intrinsic factor deficiency: Secondary | ICD-10-CM | POA: Diagnosis not present

## 2017-04-06 DIAGNOSIS — E559 Vitamin D deficiency, unspecified: Secondary | ICD-10-CM | POA: Diagnosis not present

## 2017-04-08 ENCOUNTER — Encounter (HOSPITAL_COMMUNITY): Payer: Self-pay | Admitting: Emergency Medicine

## 2017-04-08 ENCOUNTER — Emergency Department (HOSPITAL_COMMUNITY): Payer: Medicare HMO

## 2017-04-08 ENCOUNTER — Emergency Department (HOSPITAL_COMMUNITY)
Admission: EM | Admit: 2017-04-08 | Discharge: 2017-04-09 | Disposition: A | Discharge status: Home / Self Care | Payer: Medicare HMO | Attending: Emergency Medicine | Admitting: Emergency Medicine

## 2017-04-08 DIAGNOSIS — Y929 Unspecified place or not applicable: Secondary | ICD-10-CM | POA: Insufficient documentation

## 2017-04-08 DIAGNOSIS — Z8541 Personal history of malignant neoplasm of cervix uteri: Secondary | ICD-10-CM | POA: Diagnosis not present

## 2017-04-08 DIAGNOSIS — S8992XA Unspecified injury of left lower leg, initial encounter: Secondary | ICD-10-CM | POA: Diagnosis not present

## 2017-04-08 DIAGNOSIS — W0110XA Fall on same level from slipping, tripping and stumbling with subsequent striking against unspecified object, initial encounter: Secondary | ICD-10-CM | POA: Insufficient documentation

## 2017-04-08 DIAGNOSIS — S51811A Laceration without foreign body of right forearm, initial encounter: Secondary | ICD-10-CM | POA: Diagnosis not present

## 2017-04-08 DIAGNOSIS — S41111A Laceration without foreign body of right upper arm, initial encounter: Secondary | ICD-10-CM | POA: Diagnosis not present

## 2017-04-08 DIAGNOSIS — T148XXA Other injury of unspecified body region, initial encounter: Secondary | ICD-10-CM

## 2017-04-08 DIAGNOSIS — S81012A Laceration without foreign body, left knee, initial encounter: Secondary | ICD-10-CM | POA: Insufficient documentation

## 2017-04-08 DIAGNOSIS — Y998 Other external cause status: Secondary | ICD-10-CM | POA: Diagnosis not present

## 2017-04-08 DIAGNOSIS — I4891 Unspecified atrial fibrillation: Secondary | ICD-10-CM | POA: Diagnosis not present

## 2017-04-08 DIAGNOSIS — Z79899 Other long term (current) drug therapy: Secondary | ICD-10-CM | POA: Diagnosis not present

## 2017-04-08 DIAGNOSIS — E039 Hypothyroidism, unspecified: Secondary | ICD-10-CM | POA: Insufficient documentation

## 2017-04-08 DIAGNOSIS — S0990XA Unspecified injury of head, initial encounter: Secondary | ICD-10-CM | POA: Diagnosis not present

## 2017-04-08 DIAGNOSIS — Y939 Activity, unspecified: Secondary | ICD-10-CM | POA: Insufficient documentation

## 2017-04-08 DIAGNOSIS — S0180XA Unspecified open wound of other part of head, initial encounter: Secondary | ICD-10-CM | POA: Diagnosis not present

## 2017-04-08 DIAGNOSIS — R9431 Abnormal electrocardiogram [ECG] [EKG]: Secondary | ICD-10-CM | POA: Diagnosis not present

## 2017-04-08 DIAGNOSIS — W19XXXA Unspecified fall, initial encounter: Secondary | ICD-10-CM

## 2017-04-08 DIAGNOSIS — M25562 Pain in left knee: Secondary | ICD-10-CM | POA: Diagnosis not present

## 2017-04-08 HISTORY — DX: Unspecified atrial fibrillation: I48.91

## 2017-04-08 NOTE — ED Triage Notes (Signed)
Per EMS, pt from home with c/o unwitnessed fall. Pt uses walker at home, and was getting up from the table and lost her balance. States she hit her head, denies LOC, headache, vision changes. Pt taking xarelto, new skin tears to the right arm from fall. EMS vitals: BP-140/62, P-70, RR-18, CBG-118

## 2017-04-08 NOTE — ED Notes (Signed)
Patient transported to X-ray 

## 2017-04-08 NOTE — Discharge Instructions (Signed)
As discussed, your evaluation today has been largely reassuring.  But, it is important that you monitor your condition carefully, and do not hesitate to return to the ED if you develop new, or concerning changes in your condition. ? ?Otherwise, please follow-up with your physician for appropriate ongoing care. ? ?

## 2017-04-08 NOTE — ED Provider Notes (Addendum)
[facility] DEPT Provider Note   CSN: [id number] Arrival date & time: 04/08/17  2053     History   Chief Complaint Chief Complaint  Patient presents with  . Fall    HPI Teresa Gonzalez is a 83 y.o. female.  HPI  Patient presents immediately after a fall. Patient is here with family members who assist with the history of present illness. Patient recalls the entirety of the event. She states that she slipped, falling onto her backside, striking her head. Loss of consciousness did not occur. No significant confusion, disorientation. She has had pain in the posterior scalp, left knee since the event. She has not been ambulatory again, and uses a walker at baseline. She does have multiple skin tears. But no loss of sensation anywhere, no tingling anywhere or Family denies any change from orientation status emesis patient is otherwise well, with no recent medication changes, diet changes, activity changes.   Past Medical History:  Diagnosis Date  . Atrial fibrillation ([place])   . Depression   . Depression   . Hypothyroidism   . Insomnia   . Left carpal tunnel syndrome    By nerve conduction study  . Peripheral neuropathy   . S/P small bowel resection    with Diarrhea  . Status post radiation therapy   . Uterine cancer ([place])   . Vitamin D deficiency     Patient Active Problem List   Diagnosis Date Noted  . Vertigo 03/30/2017  . General weakness 03/30/2017  . History of recurrent UTIs 03/30/2017  . Hypoglycemia 02/19/2017  . UTI (urinary tract infection) 02/18/2017  . Generalized weakness 02/18/2017  . Hypokalemia 02/18/2017  . Hypocalcemia 02/18/2017  . Hypomagnesemia 02/18/2017  . Chronic diarrhea   . Atrial fibrillation ([place])   . Hyponatremia   . Osteoporosis   . Abnormality of gait 01/11/2012  . Disturbance of skin sensation 01/11/2012  . Degeneration of lumbar or lumbosacral intervertebral disc 01/11/2012  . Lumbosacral root lesions, not elsewhere  classified 01/11/2012  . Polyneuropathy in other diseases classified elsewhere ([place]) 01/11/2012    Past Surgical History:  Procedure Laterality Date  . ABDOMINAL HYSTERECTOMY    . CATARACT EXTRACTION Bilateral     OB History    No data available       Home Medications    Prior to Admission medications   Medication Sig Start Date End Date Taking? Authorizing Provider  Calcium Carbonate (CALTRATE 600 PO) Take 600 mg by mouth 3 (three) times daily.     [provider]  Cholecalciferol (VITAMIN D3) 1000 units CAPS Take 1,000 Units by mouth daily.    [provider]  furosemide (LASIX) 20 MG tablet Take 20 mg by mouth daily.    [provider]  gabapentin (NEURONTIN) 100 MG capsule Take 100 mg by mouth 2 (two) times daily as needed (nerve pain).    [provider]  loperamide (IMODIUM) 2 MG capsule Take 1 capsule (2 mg total) by mouth 5 (five) times daily as needed for diarrhea or loose stools. 10/23/13   Shon Baton, MD  LORazepam (ATIVAN) 0.5 MG tablet Take 0.5 mg by mouth daily as needed for anxiety. 01/26/17   [provider]  magnesium oxide (MAG-OX) 400 (241.3 Mg) MG tablet Take 1 tablet (400 mg total) by mouth daily. 04/01/17   Nita Sells, MD  meclizine (ANTIVERT) 12.5 MG tablet Take 12.5 mg by mouth 3 (three) times daily as needed for dizziness.    [provider]  methocarbamol (ROBAXIN) 500 MG tablet Take 500 mg by mouth every 8 (eight) hours as needed for muscle spasms.    [provider]  metoprolol tartrate (LOPRESSOR) 25 MG tablet Take 1 tablet (25 mg total) by mouth 2 (two) times daily. 08/06/15   Cherene Altes, MD  mirtazapine (REMERON) 15 MG tablet Take 7.5 mg by mouth at bedtime.    [provider]  Multiple Vitamins-Minerals (MULTIVITAMIN WITH MINERALS) tablet Take 1 tablet by mouth daily.    [provider]  omeprazole (PRILOSEC) 40 MG capsule Take 40 mg by mouth daily.     [provider]  potassium chloride SA (K-DUR,KLOR-CON) 20 MEQ tablet Take 2 tablets (40 mEq total) by mouth daily. 04/01/17   Nita Sells, MD  raloxifene (EVISTA) 60 MG tablet Take 60 mg by mouth daily.    [provider]  rivaroxaban (XARELTO) 10 MG TABS tablet Take 10 mg by mouth daily.    [provider]  SYNTHROID 50 MCG tablet Take 50 mcg by mouth daily. 07/01/15   [provider]  VITAMIN B1-B12 IJ Inject 1 mL into the skin every 21 ( twenty-one) days.     [provider]  Vitamin D, Ergocalciferol, (DRISDOL) 50000 UNITS CAPS Take 50,000 Units by mouth See admin instructions. Takes on Monday, Wednesday, Friday and Sunday    [provider]    Family History Family History  Problem Relation Age of Onset  . Stroke Mother   . Aneurysm Father   . Diabetes Brother     Social History Social History  Substance Use Topics  . Smoking status: Never Smoker  . Smokeless tobacco: Never Used  . Alcohol use No     Allergies   Codeine and Penicillins   Review of Systems Review of Systems  Constitutional:       Per HPI, otherwise negative  HENT:       Per HPI, otherwise negative  Respiratory:       Per HPI, otherwise negative  Cardiovascular:       Per HPI, otherwise negative  Gastrointestinal: Negative for vomiting.  Endocrine:       Negative aside from HPI  Genitourinary:       Neg aside from HPI   Musculoskeletal:       Per HPI, otherwise negative  Skin: Positive for wound.  Neurological: Negative for syncope.  Hematological: Bruises/bleeds easily.     Physical Exam Updated Vital Signs BP (!) 160/74 (BP Location: Left Arm)   Pulse 78   Temp 98.3 F (36.8 C) (Oral)   Resp 18   Ht 5\' 5"  (1.651 m)   Wt 46.3 kg (102 lb)   SpO2 100%   BMI 16.97 kg/m   Physical Exam  Constitutional: She is oriented to person, place, and time. She has a sickly appearance. No distress.  HENT:  Head: Normocephalic.    Minor hematoma on R occiput, no bleeding  Eyes: Conjunctivae and EOM are normal.  Neck: No spinous process tenderness and no muscular tenderness present. No neck rigidity. No edema, no erythema and normal range of motion present.  Cardiovascular: Normal rate and regular rhythm.   Pulmonary/Chest: Effort normal and breath sounds normal. No stridor. No respiratory distress.  Abdominal: She exhibits no distension.  Musculoskeletal: She exhibits no edema.       Right shoulder: Normal.       Left shoulder: Normal.       Right hip: Normal.  Left hip: Normal.       Left knee: Tenderness found. Lateral joint line tenderness noted.       Legs: Neurological: She is alert and oriented to person, place, and time. She displays atrophy. She displays no tremor. No cranial nerve deficit. She exhibits normal muscle tone. She displays no seizure activity.  Skin: Skin is warm and dry.  Skin tears about the right upper arm, right lower arm, left posterior knee, minimal active bleeding  Psychiatric: She has a normal mood and affect.  Nursing note and vitals reviewed.    ED Treatments / Results  Labs (all labs ordered are listed, but only abnormal results are displayed) Labs Reviewed - No data to display  EKG  EKG Interpretation  Date/Time:  Friday April 08 2017 20:59:33 EDT Ventricular Rate:  77 PR Interval:    QRS Duration: 126 QT Interval:  459 QTC Calculation: 520 R Axis:   -64 Text Interpretation:  Sinus rhythm Atrial premature complexes Borderline prolonged PR interval Nonspecific IVCD with LAD Left ventricular hypertrophy No significant change since last tracing Abnormal ekg Confirmed by Carmin Muskrat [phone]) on 04/08/2017 9:08:29 PM       Radiology I have reviewed the CT, x-ray images myself, agree with the interpretation. Procedures Procedures (including critical care time)   Initial Impression / Assessment and Plan / ED Course  I have reviewed the triage vital signs and the  nursing notes.  Pertinent labs & imaging results that were available during my care of the patient were reviewed by me and considered in my medical decision making (see chart for details).  Elderly female presents after mechanical fall with pain in multiple areas, skin tear. Patient is on blood thinning medication, and CT scan was performed. This was reassuring.   Evaluation skin demonstrates a large skin tear in the right upper arm, smaller skin tears in the right wrist, left posterior knee, no active bleeding.  Patient had no distal loss of sensation or strength anywhere, and reassuring x-ray, CT scan, was discharged in stable condition.  Final Clinical Impressions(s) / ED Diagnoses  Fall, initial encounter Head trauma, adult, initial encounter Skin tears, multiple   Carmin Muskrat, MD 04/08/17 2340    Carmin Muskrat, MD 04/08/17 [phone]

## 2017-04-10 DIAGNOSIS — I447 Left bundle-branch block, unspecified: Secondary | ICD-10-CM | POA: Diagnosis not present

## 2017-04-10 DIAGNOSIS — I10 Essential (primary) hypertension: Secondary | ICD-10-CM | POA: Diagnosis not present

## 2017-04-10 DIAGNOSIS — I4891 Unspecified atrial fibrillation: Secondary | ICD-10-CM | POA: Diagnosis not present

## 2017-04-10 DIAGNOSIS — Z87440 Personal history of urinary (tract) infections: Secondary | ICD-10-CM | POA: Diagnosis not present

## 2017-04-11 DIAGNOSIS — I10 Essential (primary) hypertension: Secondary | ICD-10-CM | POA: Diagnosis not present

## 2017-04-11 DIAGNOSIS — M810 Age-related osteoporosis without current pathological fracture: Secondary | ICD-10-CM | POA: Diagnosis not present

## 2017-04-11 DIAGNOSIS — I4891 Unspecified atrial fibrillation: Secondary | ICD-10-CM | POA: Diagnosis not present

## 2017-04-11 DIAGNOSIS — D510 Vitamin B12 deficiency anemia due to intrinsic factor deficiency: Secondary | ICD-10-CM | POA: Diagnosis not present

## 2017-04-11 DIAGNOSIS — E871 Hypo-osmolality and hyponatremia: Secondary | ICD-10-CM | POA: Diagnosis not present

## 2017-04-11 DIAGNOSIS — I480 Paroxysmal atrial fibrillation: Secondary | ICD-10-CM | POA: Diagnosis not present

## 2017-04-11 DIAGNOSIS — Z681 Body mass index (BMI) 19 or less, adult: Secondary | ICD-10-CM | POA: Diagnosis not present

## 2017-04-11 DIAGNOSIS — R262 Difficulty in walking, not elsewhere classified: Secondary | ICD-10-CM | POA: Diagnosis not present

## 2017-04-11 DIAGNOSIS — N390 Urinary tract infection, site not specified: Secondary | ICD-10-CM | POA: Diagnosis not present

## 2017-04-11 DIAGNOSIS — I447 Left bundle-branch block, unspecified: Secondary | ICD-10-CM | POA: Diagnosis not present

## 2017-04-11 DIAGNOSIS — M5440 Lumbago with sciatica, unspecified side: Secondary | ICD-10-CM | POA: Diagnosis not present

## 2017-04-11 DIAGNOSIS — Z87440 Personal history of urinary (tract) infections: Secondary | ICD-10-CM | POA: Diagnosis not present

## 2017-04-11 DIAGNOSIS — G629 Polyneuropathy, unspecified: Secondary | ICD-10-CM | POA: Diagnosis not present

## 2017-04-12 DIAGNOSIS — G629 Polyneuropathy, unspecified: Secondary | ICD-10-CM | POA: Diagnosis not present

## 2017-04-12 DIAGNOSIS — Z87440 Personal history of urinary (tract) infections: Secondary | ICD-10-CM | POA: Diagnosis not present

## 2017-04-12 DIAGNOSIS — I10 Essential (primary) hypertension: Secondary | ICD-10-CM | POA: Diagnosis not present

## 2017-04-12 DIAGNOSIS — M5440 Lumbago with sciatica, unspecified side: Secondary | ICD-10-CM | POA: Diagnosis not present

## 2017-04-12 DIAGNOSIS — I4891 Unspecified atrial fibrillation: Secondary | ICD-10-CM | POA: Diagnosis not present

## 2017-04-12 DIAGNOSIS — M810 Age-related osteoporosis without current pathological fracture: Secondary | ICD-10-CM | POA: Diagnosis not present

## 2017-04-12 DIAGNOSIS — R262 Difficulty in walking, not elsewhere classified: Secondary | ICD-10-CM | POA: Diagnosis not present

## 2017-04-12 DIAGNOSIS — D510 Vitamin B12 deficiency anemia due to intrinsic factor deficiency: Secondary | ICD-10-CM | POA: Diagnosis not present

## 2017-04-12 DIAGNOSIS — I447 Left bundle-branch block, unspecified: Secondary | ICD-10-CM | POA: Diagnosis not present

## 2017-04-14 DIAGNOSIS — Z4800 Encounter for change or removal of nonsurgical wound dressing: Secondary | ICD-10-CM | POA: Diagnosis not present

## 2017-04-14 DIAGNOSIS — Z681 Body mass index (BMI) 19 or less, adult: Secondary | ICD-10-CM | POA: Diagnosis not present

## 2017-04-18 DIAGNOSIS — R262 Difficulty in walking, not elsewhere classified: Secondary | ICD-10-CM | POA: Diagnosis not present

## 2017-04-18 DIAGNOSIS — E871 Hypo-osmolality and hyponatremia: Secondary | ICD-10-CM | POA: Diagnosis not present

## 2017-04-18 DIAGNOSIS — Z681 Body mass index (BMI) 19 or less, adult: Secondary | ICD-10-CM | POA: Diagnosis not present

## 2017-04-18 DIAGNOSIS — I4891 Unspecified atrial fibrillation: Secondary | ICD-10-CM | POA: Diagnosis not present

## 2017-04-18 DIAGNOSIS — Z4800 Encounter for change or removal of nonsurgical wound dressing: Secondary | ICD-10-CM | POA: Diagnosis not present

## 2017-04-18 DIAGNOSIS — G629 Polyneuropathy, unspecified: Secondary | ICD-10-CM | POA: Diagnosis not present

## 2017-04-18 DIAGNOSIS — D510 Vitamin B12 deficiency anemia due to intrinsic factor deficiency: Secondary | ICD-10-CM | POA: Diagnosis not present

## 2017-04-18 DIAGNOSIS — I447 Left bundle-branch block, unspecified: Secondary | ICD-10-CM | POA: Diagnosis not present

## 2017-04-18 DIAGNOSIS — I480 Paroxysmal atrial fibrillation: Secondary | ICD-10-CM | POA: Diagnosis not present

## 2017-04-18 DIAGNOSIS — M5440 Lumbago with sciatica, unspecified side: Secondary | ICD-10-CM | POA: Diagnosis not present

## 2017-04-18 DIAGNOSIS — Z87440 Personal history of urinary (tract) infections: Secondary | ICD-10-CM | POA: Diagnosis not present

## 2017-04-18 DIAGNOSIS — M810 Age-related osteoporosis without current pathological fracture: Secondary | ICD-10-CM | POA: Diagnosis not present

## 2017-04-18 DIAGNOSIS — Z7901 Long term (current) use of anticoagulants: Secondary | ICD-10-CM | POA: Diagnosis not present

## 2017-04-18 DIAGNOSIS — I10 Essential (primary) hypertension: Secondary | ICD-10-CM | POA: Diagnosis not present

## 2017-04-21 DIAGNOSIS — M810 Age-related osteoporosis without current pathological fracture: Secondary | ICD-10-CM | POA: Diagnosis not present

## 2017-04-21 DIAGNOSIS — Z87440 Personal history of urinary (tract) infections: Secondary | ICD-10-CM | POA: Diagnosis not present

## 2017-04-21 DIAGNOSIS — I10 Essential (primary) hypertension: Secondary | ICD-10-CM | POA: Diagnosis not present

## 2017-04-21 DIAGNOSIS — I4891 Unspecified atrial fibrillation: Secondary | ICD-10-CM | POA: Diagnosis not present

## 2017-04-21 DIAGNOSIS — M5440 Lumbago with sciatica, unspecified side: Secondary | ICD-10-CM | POA: Diagnosis not present

## 2017-04-21 DIAGNOSIS — I447 Left bundle-branch block, unspecified: Secondary | ICD-10-CM | POA: Diagnosis not present

## 2017-04-21 DIAGNOSIS — G629 Polyneuropathy, unspecified: Secondary | ICD-10-CM | POA: Diagnosis not present

## 2017-04-21 DIAGNOSIS — R262 Difficulty in walking, not elsewhere classified: Secondary | ICD-10-CM | POA: Diagnosis not present

## 2017-04-21 DIAGNOSIS — R531 Weakness: Secondary | ICD-10-CM | POA: Diagnosis not present

## 2017-04-21 DIAGNOSIS — D510 Vitamin B12 deficiency anemia due to intrinsic factor deficiency: Secondary | ICD-10-CM | POA: Diagnosis not present

## 2017-04-22 DIAGNOSIS — Z681 Body mass index (BMI) 19 or less, adult: Secondary | ICD-10-CM | POA: Diagnosis not present

## 2017-04-22 DIAGNOSIS — Z4800 Encounter for change or removal of nonsurgical wound dressing: Secondary | ICD-10-CM | POA: Diagnosis not present

## 2017-04-26 DIAGNOSIS — I447 Left bundle-branch block, unspecified: Secondary | ICD-10-CM | POA: Diagnosis not present

## 2017-04-26 DIAGNOSIS — Z87440 Personal history of urinary (tract) infections: Secondary | ICD-10-CM | POA: Diagnosis not present

## 2017-04-26 DIAGNOSIS — D510 Vitamin B12 deficiency anemia due to intrinsic factor deficiency: Secondary | ICD-10-CM | POA: Diagnosis not present

## 2017-04-26 DIAGNOSIS — R262 Difficulty in walking, not elsewhere classified: Secondary | ICD-10-CM | POA: Diagnosis not present

## 2017-04-26 DIAGNOSIS — I4891 Unspecified atrial fibrillation: Secondary | ICD-10-CM | POA: Diagnosis not present

## 2017-04-26 DIAGNOSIS — M5440 Lumbago with sciatica, unspecified side: Secondary | ICD-10-CM | POA: Diagnosis not present

## 2017-04-26 DIAGNOSIS — I10 Essential (primary) hypertension: Secondary | ICD-10-CM | POA: Diagnosis not present

## 2017-04-26 DIAGNOSIS — G629 Polyneuropathy, unspecified: Secondary | ICD-10-CM | POA: Diagnosis not present

## 2017-04-26 DIAGNOSIS — M810 Age-related osteoporosis without current pathological fracture: Secondary | ICD-10-CM | POA: Diagnosis not present

## 2017-04-29 DIAGNOSIS — Z4800 Encounter for change or removal of nonsurgical wound dressing: Secondary | ICD-10-CM | POA: Diagnosis not present

## 2017-04-29 DIAGNOSIS — S40921D Unspecified superficial injury of right upper arm, subsequent encounter: Secondary | ICD-10-CM | POA: Diagnosis not present

## 2017-05-03 DIAGNOSIS — I10 Essential (primary) hypertension: Secondary | ICD-10-CM | POA: Diagnosis not present

## 2017-05-03 DIAGNOSIS — G629 Polyneuropathy, unspecified: Secondary | ICD-10-CM | POA: Diagnosis not present

## 2017-05-03 DIAGNOSIS — M810 Age-related osteoporosis without current pathological fracture: Secondary | ICD-10-CM | POA: Diagnosis not present

## 2017-05-03 DIAGNOSIS — Z87440 Personal history of urinary (tract) infections: Secondary | ICD-10-CM | POA: Diagnosis not present

## 2017-05-03 DIAGNOSIS — D510 Vitamin B12 deficiency anemia due to intrinsic factor deficiency: Secondary | ICD-10-CM | POA: Diagnosis not present

## 2017-05-03 DIAGNOSIS — R262 Difficulty in walking, not elsewhere classified: Secondary | ICD-10-CM | POA: Diagnosis not present

## 2017-05-03 DIAGNOSIS — I447 Left bundle-branch block, unspecified: Secondary | ICD-10-CM | POA: Diagnosis not present

## 2017-05-03 DIAGNOSIS — I4891 Unspecified atrial fibrillation: Secondary | ICD-10-CM | POA: Diagnosis not present

## 2017-05-03 DIAGNOSIS — M5440 Lumbago with sciatica, unspecified side: Secondary | ICD-10-CM | POA: Diagnosis not present

## 2017-05-06 DIAGNOSIS — M810 Age-related osteoporosis without current pathological fracture: Secondary | ICD-10-CM | POA: Diagnosis not present

## 2017-05-06 DIAGNOSIS — G629 Polyneuropathy, unspecified: Secondary | ICD-10-CM | POA: Diagnosis not present

## 2017-05-06 DIAGNOSIS — I447 Left bundle-branch block, unspecified: Secondary | ICD-10-CM | POA: Diagnosis not present

## 2017-05-06 DIAGNOSIS — M5440 Lumbago with sciatica, unspecified side: Secondary | ICD-10-CM | POA: Diagnosis not present

## 2017-05-06 DIAGNOSIS — I10 Essential (primary) hypertension: Secondary | ICD-10-CM | POA: Diagnosis not present

## 2017-05-06 DIAGNOSIS — D510 Vitamin B12 deficiency anemia due to intrinsic factor deficiency: Secondary | ICD-10-CM | POA: Diagnosis not present

## 2017-05-06 DIAGNOSIS — R262 Difficulty in walking, not elsewhere classified: Secondary | ICD-10-CM | POA: Diagnosis not present

## 2017-05-06 DIAGNOSIS — Z87440 Personal history of urinary (tract) infections: Secondary | ICD-10-CM | POA: Diagnosis not present

## 2017-05-06 DIAGNOSIS — I4891 Unspecified atrial fibrillation: Secondary | ICD-10-CM | POA: Diagnosis not present

## 2017-05-12 DIAGNOSIS — R262 Difficulty in walking, not elsewhere classified: Secondary | ICD-10-CM | POA: Diagnosis not present

## 2017-05-12 DIAGNOSIS — M810 Age-related osteoporosis without current pathological fracture: Secondary | ICD-10-CM | POA: Diagnosis not present

## 2017-05-12 DIAGNOSIS — G629 Polyneuropathy, unspecified: Secondary | ICD-10-CM | POA: Diagnosis not present

## 2017-05-12 DIAGNOSIS — I4891 Unspecified atrial fibrillation: Secondary | ICD-10-CM | POA: Diagnosis not present

## 2017-05-12 DIAGNOSIS — I10 Essential (primary) hypertension: Secondary | ICD-10-CM | POA: Diagnosis not present

## 2017-05-12 DIAGNOSIS — I447 Left bundle-branch block, unspecified: Secondary | ICD-10-CM | POA: Diagnosis not present

## 2017-05-12 DIAGNOSIS — D510 Vitamin B12 deficiency anemia due to intrinsic factor deficiency: Secondary | ICD-10-CM | POA: Diagnosis not present

## 2017-05-12 DIAGNOSIS — M5440 Lumbago with sciatica, unspecified side: Secondary | ICD-10-CM | POA: Diagnosis not present

## 2017-05-12 DIAGNOSIS — Z87440 Personal history of urinary (tract) infections: Secondary | ICD-10-CM | POA: Diagnosis not present

## 2017-05-23 DIAGNOSIS — N390 Urinary tract infection, site not specified: Secondary | ICD-10-CM | POA: Diagnosis not present

## 2017-06-22 DIAGNOSIS — E559 Vitamin D deficiency, unspecified: Secondary | ICD-10-CM | POA: Diagnosis not present

## 2017-06-22 DIAGNOSIS — E538 Deficiency of other specified B group vitamins: Secondary | ICD-10-CM | POA: Diagnosis not present

## 2017-06-22 DIAGNOSIS — E781 Pure hyperglyceridemia: Secondary | ICD-10-CM | POA: Diagnosis not present

## 2017-06-22 DIAGNOSIS — N390 Urinary tract infection, site not specified: Secondary | ICD-10-CM | POA: Diagnosis not present

## 2017-06-22 DIAGNOSIS — E038 Other specified hypothyroidism: Secondary | ICD-10-CM | POA: Diagnosis not present

## 2017-06-22 DIAGNOSIS — R808 Other proteinuria: Secondary | ICD-10-CM | POA: Diagnosis not present

## 2017-06-29 DIAGNOSIS — L03113 Cellulitis of right upper limb: Secondary | ICD-10-CM | POA: Diagnosis not present

## 2017-06-29 DIAGNOSIS — M7020 Olecranon bursitis, unspecified elbow: Secondary | ICD-10-CM | POA: Diagnosis not present

## 2017-06-29 DIAGNOSIS — Z681 Body mass index (BMI) 19 or less, adult: Secondary | ICD-10-CM | POA: Diagnosis not present

## 2017-07-07 DIAGNOSIS — I130 Hypertensive heart and chronic kidney disease with heart failure and stage 1 through stage 4 chronic kidney disease, or unspecified chronic kidney disease: Secondary | ICD-10-CM | POA: Diagnosis not present

## 2017-07-07 DIAGNOSIS — D692 Other nonthrombocytopenic purpura: Secondary | ICD-10-CM | POA: Diagnosis not present

## 2017-07-07 DIAGNOSIS — Z7901 Long term (current) use of anticoagulants: Secondary | ICD-10-CM | POA: Diagnosis not present

## 2017-07-07 DIAGNOSIS — R29898 Other symptoms and signs involving the musculoskeletal system: Secondary | ICD-10-CM | POA: Diagnosis not present

## 2017-07-07 DIAGNOSIS — Z23 Encounter for immunization: Secondary | ICD-10-CM | POA: Diagnosis not present

## 2017-07-07 DIAGNOSIS — F418 Other specified anxiety disorders: Secondary | ICD-10-CM | POA: Diagnosis not present

## 2017-07-07 DIAGNOSIS — Z0000 Encounter for general adult medical examination without abnormal findings: Secondary | ICD-10-CM | POA: Diagnosis not present

## 2017-07-07 DIAGNOSIS — N390 Urinary tract infection, site not specified: Secondary | ICD-10-CM | POA: Diagnosis not present

## 2017-07-07 DIAGNOSIS — M7020 Olecranon bursitis, unspecified elbow: Secondary | ICD-10-CM | POA: Diagnosis not present

## 2017-08-01 DIAGNOSIS — R8290 Unspecified abnormal findings in urine: Secondary | ICD-10-CM | POA: Diagnosis not present

## 2017-08-01 DIAGNOSIS — N390 Urinary tract infection, site not specified: Secondary | ICD-10-CM | POA: Diagnosis not present

## 2017-08-01 DIAGNOSIS — I10 Essential (primary) hypertension: Secondary | ICD-10-CM | POA: Diagnosis not present

## 2017-08-01 DIAGNOSIS — I130 Hypertensive heart and chronic kidney disease with heart failure and stage 1 through stage 4 chronic kidney disease, or unspecified chronic kidney disease: Secondary | ICD-10-CM | POA: Diagnosis not present

## 2017-11-04 DIAGNOSIS — Z681 Body mass index (BMI) 19 or less, adult: Secondary | ICD-10-CM | POA: Diagnosis not present

## 2017-11-04 DIAGNOSIS — S40819A Abrasion of unspecified upper arm, initial encounter: Secondary | ICD-10-CM | POA: Diagnosis not present

## 2017-11-04 DIAGNOSIS — W19XXXA Unspecified fall, initial encounter: Secondary | ICD-10-CM | POA: Diagnosis not present

## 2017-11-04 DIAGNOSIS — M4850XA Collapsed vertebra, not elsewhere classified, site unspecified, initial encounter for fracture: Secondary | ICD-10-CM | POA: Diagnosis not present

## 2017-11-04 DIAGNOSIS — M545 Low back pain: Secondary | ICD-10-CM | POA: Diagnosis not present

## 2017-11-24 DIAGNOSIS — I2510 Atherosclerotic heart disease of native coronary artery without angina pectoris: Secondary | ICD-10-CM | POA: Diagnosis not present

## 2017-11-24 DIAGNOSIS — M545 Low back pain: Secondary | ICD-10-CM | POA: Diagnosis not present

## 2017-11-24 DIAGNOSIS — E038 Other specified hypothyroidism: Secondary | ICD-10-CM | POA: Diagnosis not present

## 2017-11-24 DIAGNOSIS — I130 Hypertensive heart and chronic kidney disease with heart failure and stage 1 through stage 4 chronic kidney disease, or unspecified chronic kidney disease: Secondary | ICD-10-CM | POA: Diagnosis not present

## 2017-11-24 DIAGNOSIS — N182 Chronic kidney disease, stage 2 (mild): Secondary | ICD-10-CM | POA: Diagnosis not present

## 2017-11-24 DIAGNOSIS — I428 Other cardiomyopathies: Secondary | ICD-10-CM | POA: Diagnosis not present

## 2017-11-24 DIAGNOSIS — Z7901 Long term (current) use of anticoagulants: Secondary | ICD-10-CM | POA: Diagnosis not present

## 2017-11-24 DIAGNOSIS — R600 Localized edema: Secondary | ICD-10-CM | POA: Diagnosis not present

## 2017-11-24 DIAGNOSIS — I480 Paroxysmal atrial fibrillation: Secondary | ICD-10-CM | POA: Diagnosis not present

## 2018-01-21 IMAGING — CR DG KNEE COMPLETE 4+V*L*
4 series · 4 of 4 positions shown · non-contrast
Comparison: 12/10/2007

CLINICAL DATA: Lateral side anterior knee pain, fall

EXAM:
LEFT KNEE - COMPLETE 4+ VIEW

[knee ap]
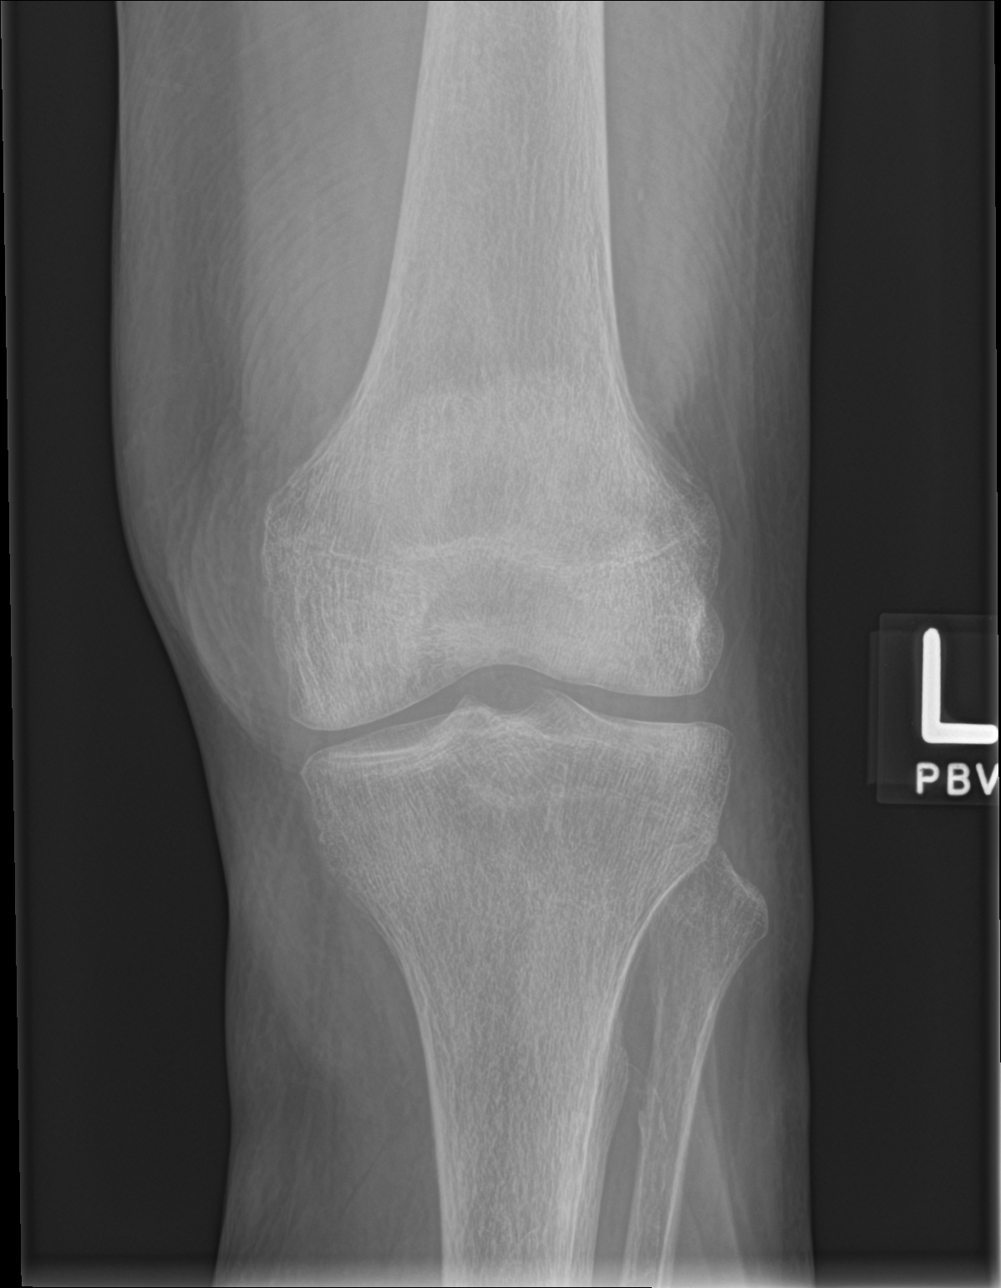

[knee lat]
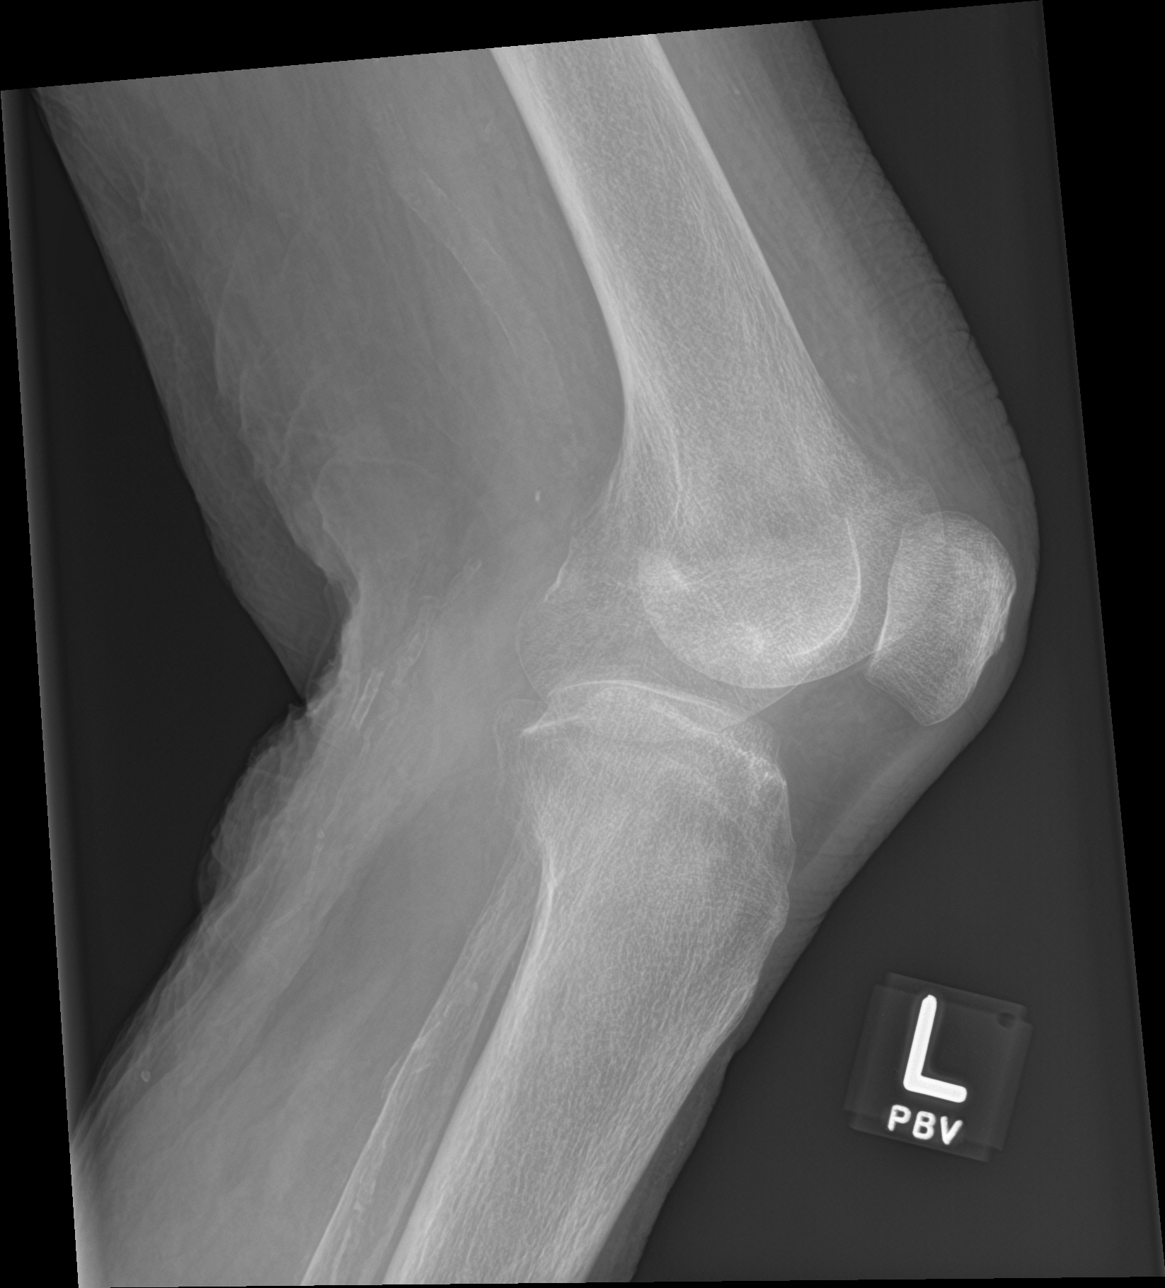

[knee obl (1 of 2)]
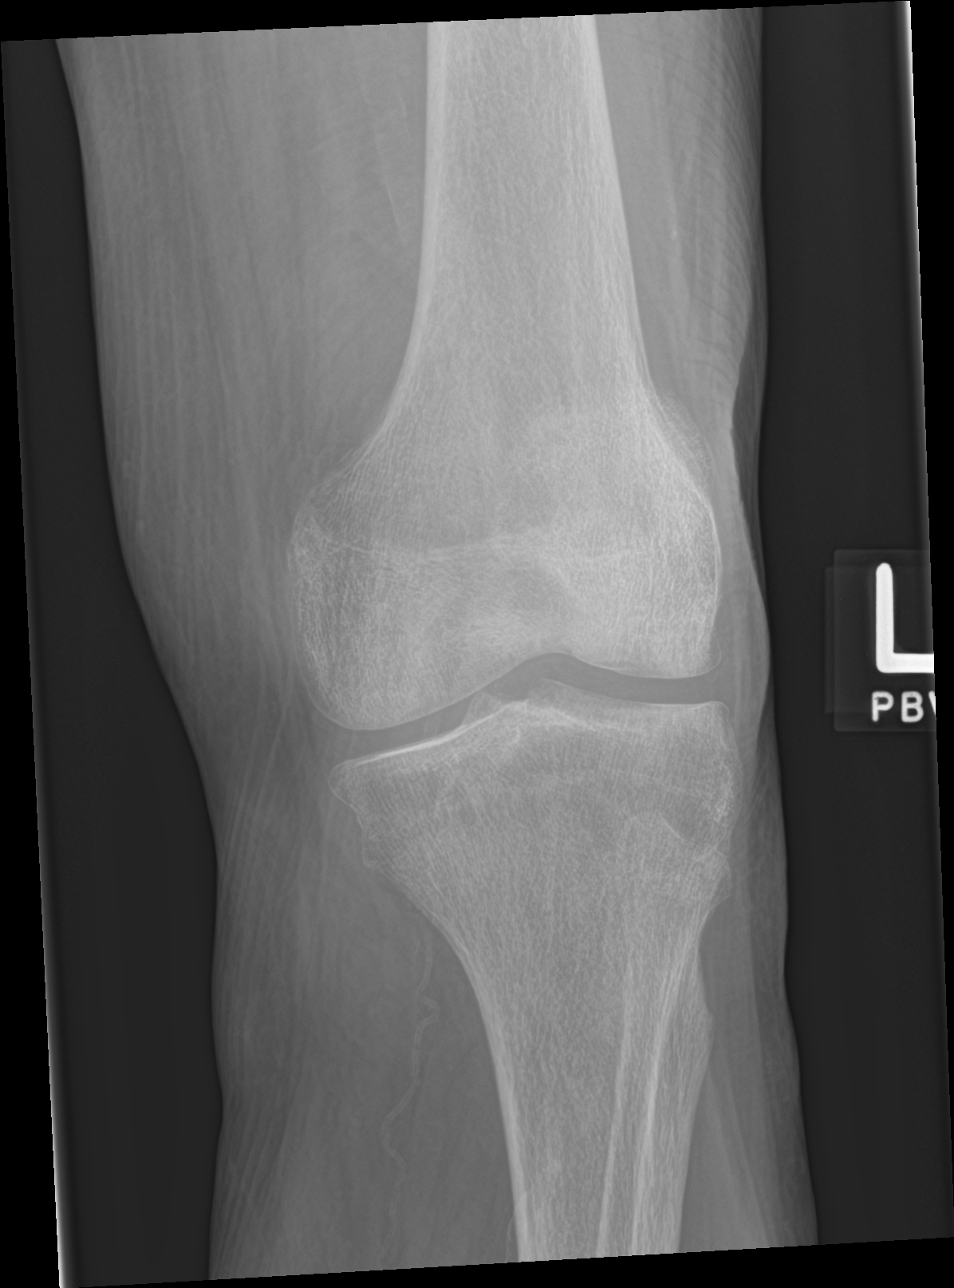

[knee obl (2 of 2)]
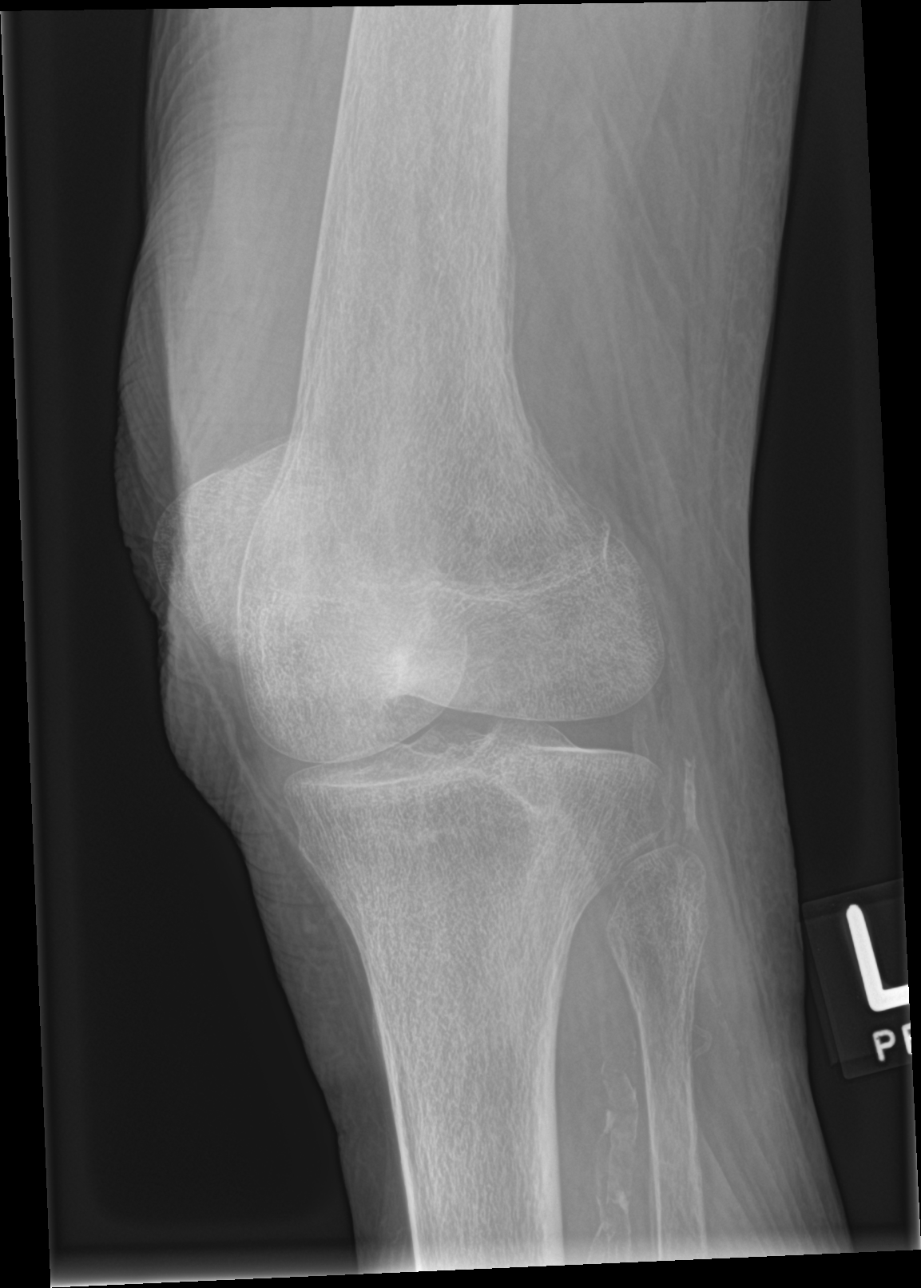

[4 of 4 positions shown; findings below may reference images not displayed]

FINDINGS: Osteopenia. No acute displaced fracture or malalignment. No large
effusion. Extensive vascular calcification.
IMPRESSION: No acute osseous abnormality

## 2018-01-21 IMAGING — CT CT HEAD W/O CM
4 series · 17 of 47 positions shown, 19 images · non-contrast
Comparison: MRI 03/31/2017,, CT head 05/25/2016

CLINICAL DATA: Unwitnessed fall

EXAM:
CT HEAD WITHOUT CONTRAST
TECHNIQUE: Contiguous axial images were obtained from the base of the skull
through the vertex without intravenous contrast.

[Series 3: head without · axial · non-contrast · 0.43mm/px · z∈[-82,+38]mm · 7 of 33 slices shown, 9 images]
[im 5/33  brain]
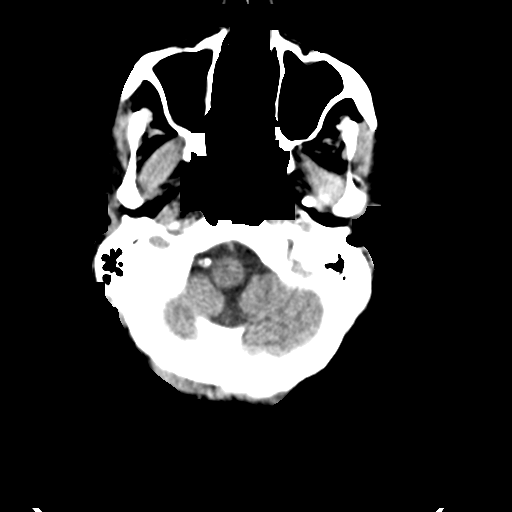
[im 5/33  bone]
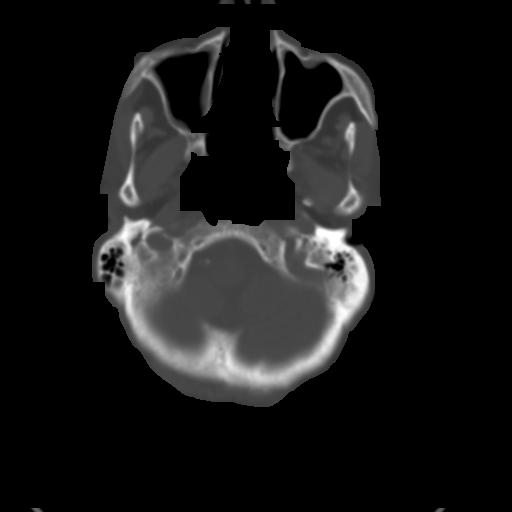
[im 9/33  brain]
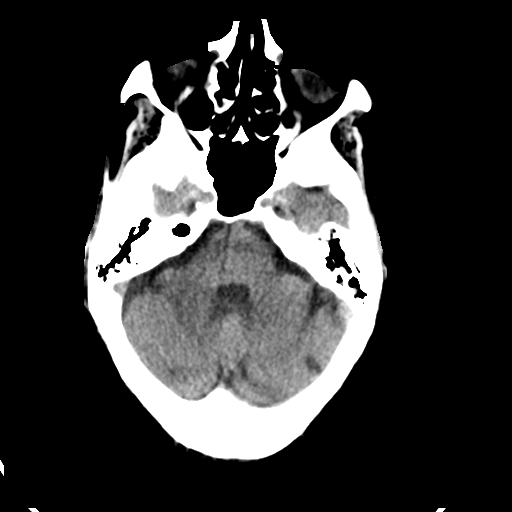
[im 13/33  brain]
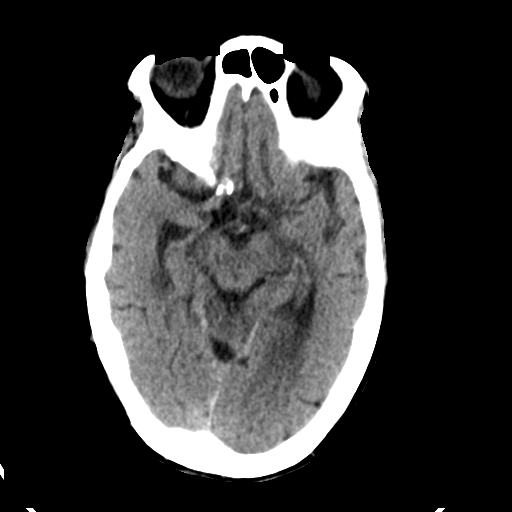
[im 17/33  brain]
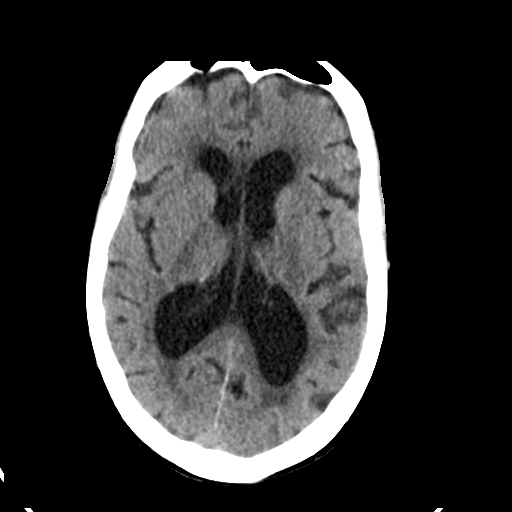
[im 21/33  brain]
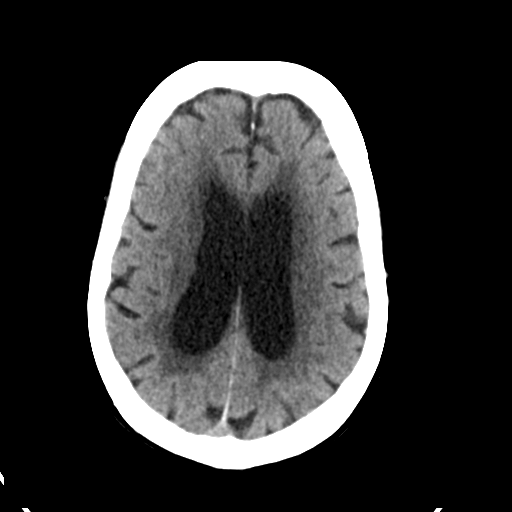
[im 21/33  bone]
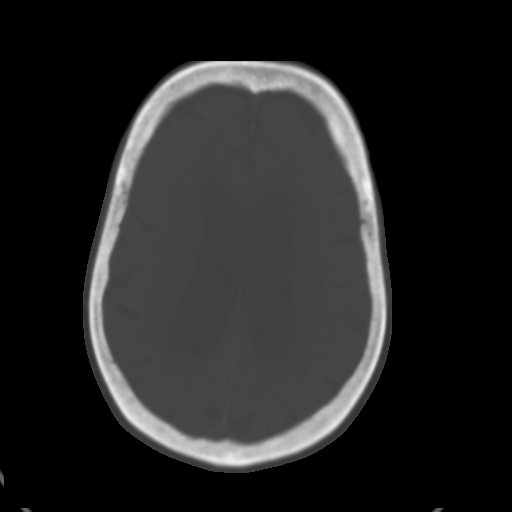
[im 25/33  brain]
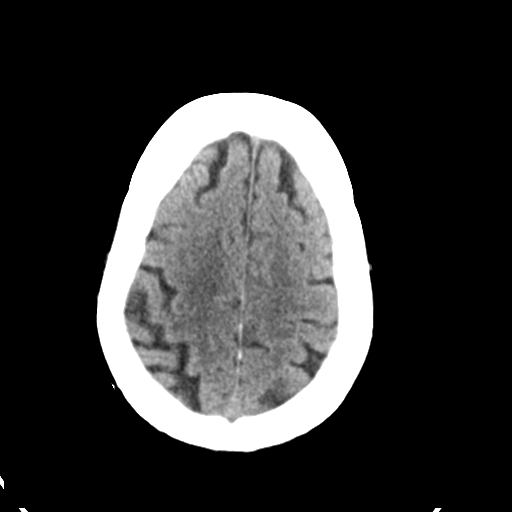
[im 29/33  brain]
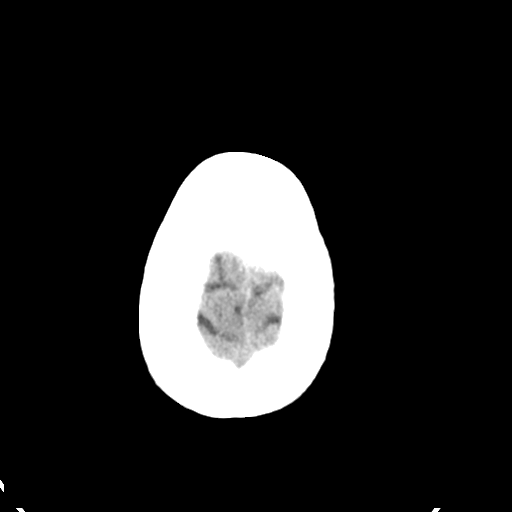

[Series 4: head bone · axial · 0.43mm/px · z∈[-86,-30]mm · 4 of 82 slices shown]
[im 9/82  bone]
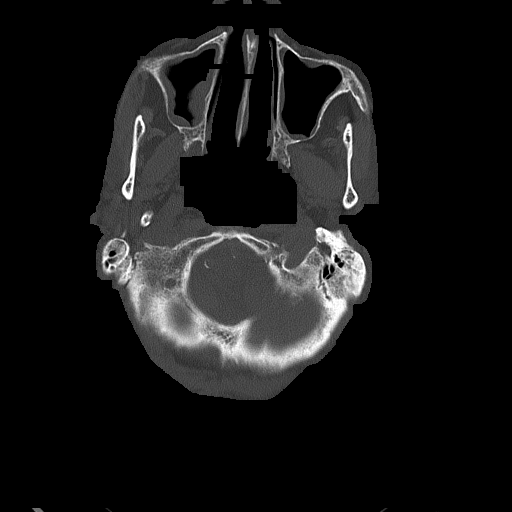
[im 17/82  bone]
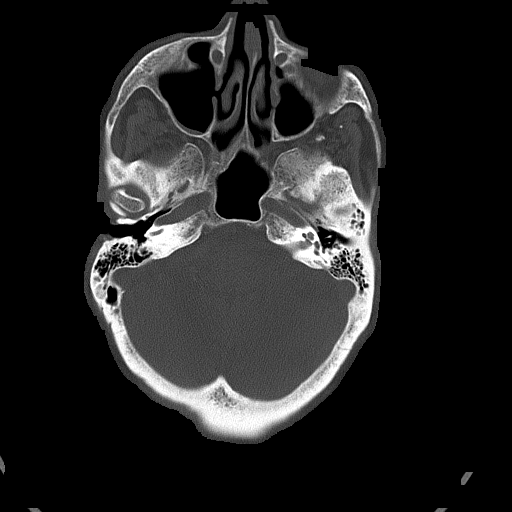
[im 25/82  bone]
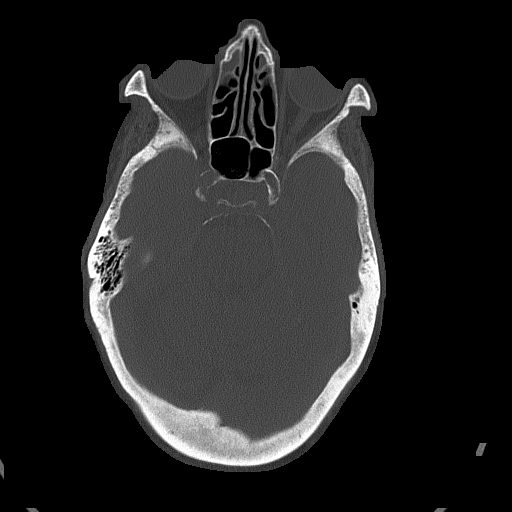
[im 37/82  bone]
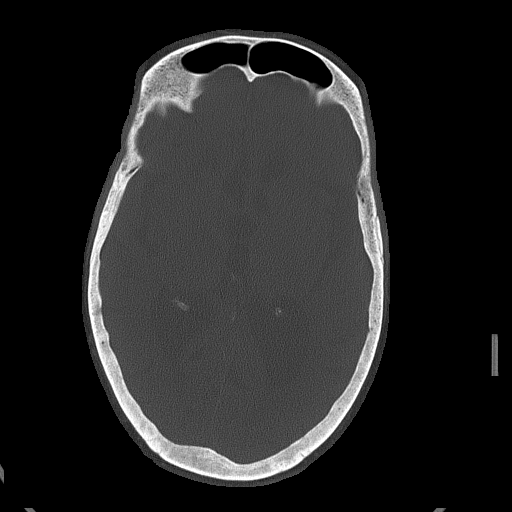

[Series 5: head without cor · coronal · non-contrast · 0.30mm/px · 3 of 67 slices shown]
[im 23/67  brain]
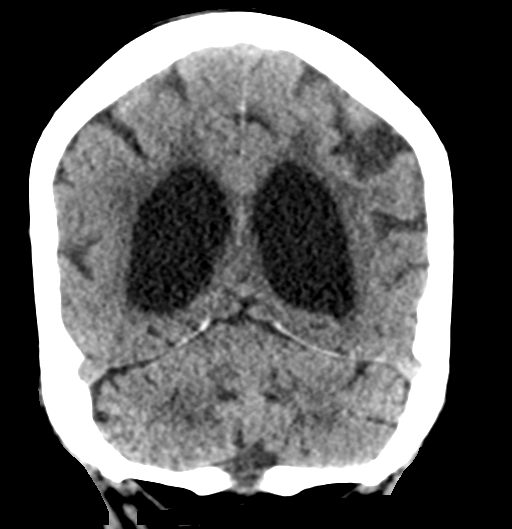
[im 30/67  brain]
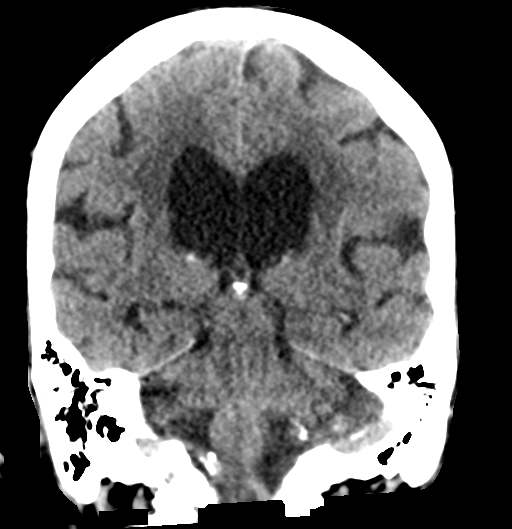
[im 37/67  brain]
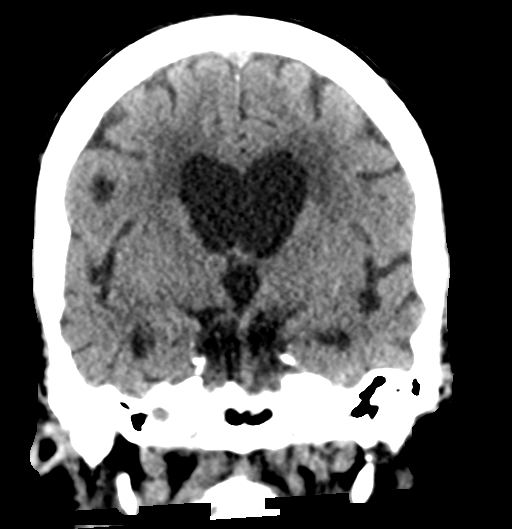

[Series 6: head without sag · sagittal · non-contrast · 0.31mm/px · 3 of 50 slices shown]
[im 17/50  brain]
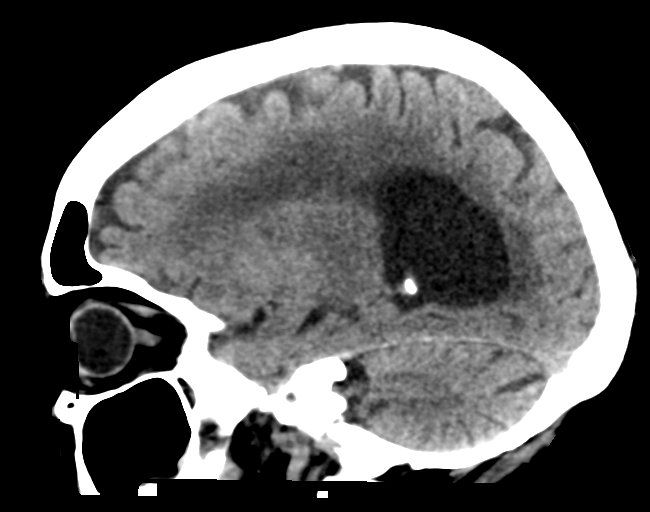
[im 25/50  brain]
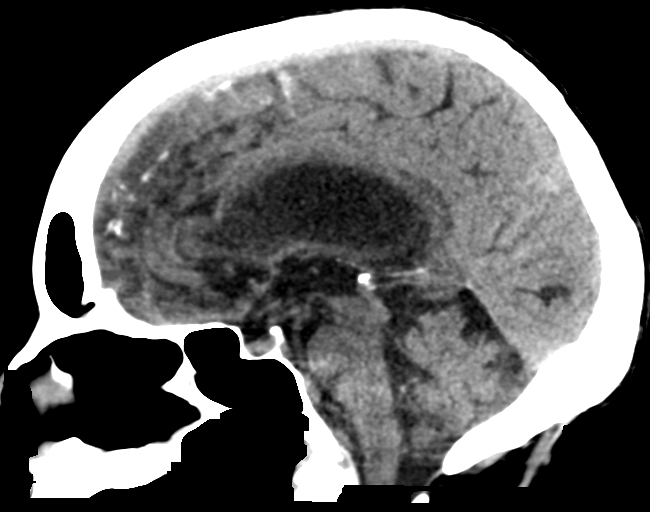
[im 33/50  brain]
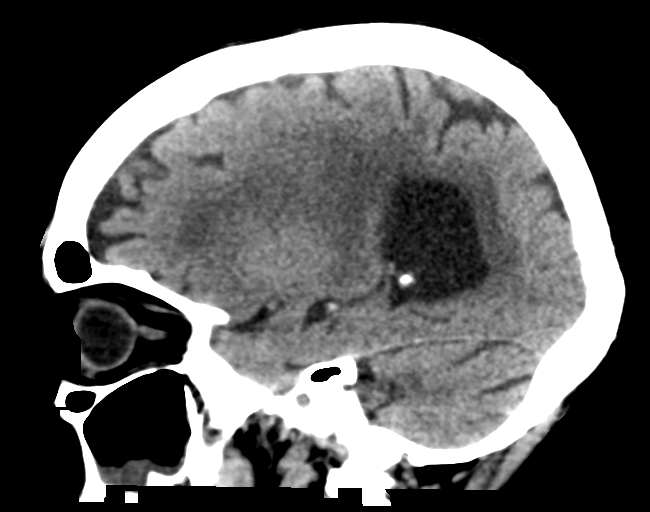

[17 of 47 positions shown; findings below may reference images not displayed]

FINDINGS: Brain: No acute territorial infarction, hemorrhage, or intracranial
mass is seen. Moderate small vessel ischemic changes of the white
matter. Moderate atrophy. Stable prominent ventricles.

Vascular: No hyperdense vessels. Carotid artery calcifications.
Vertebral artery calcifications.

Skull: Normal. Negative for fracture or focal lesion.

Sinuses/Orbits: Lobulated mucosal thickening in the maxillary
sinuses with debris in the sphenoid sinuses and mucosal thickening
in the ethmoid sinuses. No acute orbital abnormality.

Other: None
IMPRESSION: 1. No CT evidence for acute intracranial abnormality.
2. Atrophy and small vessel ischemic changes of the white matter

## 2018-03-07 DIAGNOSIS — I480 Paroxysmal atrial fibrillation: Secondary | ICD-10-CM | POA: Diagnosis not present

## 2018-03-07 DIAGNOSIS — I2721 Secondary pulmonary arterial hypertension: Secondary | ICD-10-CM | POA: Diagnosis not present

## 2018-03-07 DIAGNOSIS — I130 Hypertensive heart and chronic kidney disease with heart failure and stage 1 through stage 4 chronic kidney disease, or unspecified chronic kidney disease: Secondary | ICD-10-CM | POA: Diagnosis not present

## 2018-03-07 DIAGNOSIS — R627 Adult failure to thrive: Secondary | ICD-10-CM | POA: Diagnosis not present

## 2018-03-07 DIAGNOSIS — R600 Localized edema: Secondary | ICD-10-CM | POA: Diagnosis not present

## 2018-03-07 DIAGNOSIS — E46 Unspecified protein-calorie malnutrition: Secondary | ICD-10-CM | POA: Diagnosis not present

## 2018-03-07 DIAGNOSIS — Z7901 Long term (current) use of anticoagulants: Secondary | ICD-10-CM | POA: Diagnosis not present

## 2018-03-07 DIAGNOSIS — N182 Chronic kidney disease, stage 2 (mild): Secondary | ICD-10-CM | POA: Diagnosis not present

## 2018-03-07 DIAGNOSIS — M545 Low back pain: Secondary | ICD-10-CM | POA: Diagnosis not present

## 2018-04-08 DIAGNOSIS — L03011 Cellulitis of right finger: Secondary | ICD-10-CM | POA: Diagnosis not present

## 2018-07-04 DIAGNOSIS — I10 Essential (primary) hypertension: Secondary | ICD-10-CM | POA: Diagnosis not present

## 2018-07-04 DIAGNOSIS — E781 Pure hyperglyceridemia: Secondary | ICD-10-CM | POA: Diagnosis not present

## 2018-07-04 DIAGNOSIS — Z0000 Encounter for general adult medical examination without abnormal findings: Secondary | ICD-10-CM | POA: Diagnosis not present

## 2018-07-04 DIAGNOSIS — R82998 Other abnormal findings in urine: Secondary | ICD-10-CM | POA: Diagnosis not present

## 2018-07-04 DIAGNOSIS — M810 Age-related osteoporosis without current pathological fracture: Secondary | ICD-10-CM | POA: Diagnosis not present

## 2018-07-04 DIAGNOSIS — E039 Hypothyroidism, unspecified: Secondary | ICD-10-CM | POA: Diagnosis not present

## 2018-07-04 DIAGNOSIS — E538 Deficiency of other specified B group vitamins: Secondary | ICD-10-CM | POA: Diagnosis not present

## 2018-07-11 DIAGNOSIS — I2510 Atherosclerotic heart disease of native coronary artery without angina pectoris: Secondary | ICD-10-CM | POA: Diagnosis not present

## 2018-07-11 DIAGNOSIS — N182 Chronic kidney disease, stage 2 (mild): Secondary | ICD-10-CM | POA: Diagnosis not present

## 2018-07-11 DIAGNOSIS — Z0000 Encounter for general adult medical examination without abnormal findings: Secondary | ICD-10-CM | POA: Diagnosis not present

## 2018-07-11 DIAGNOSIS — I429 Cardiomyopathy, unspecified: Secondary | ICD-10-CM | POA: Diagnosis not present

## 2018-07-11 DIAGNOSIS — D692 Other nonthrombocytopenic purpura: Secondary | ICD-10-CM | POA: Diagnosis not present

## 2018-07-11 DIAGNOSIS — R600 Localized edema: Secondary | ICD-10-CM | POA: Diagnosis not present

## 2018-07-11 DIAGNOSIS — I480 Paroxysmal atrial fibrillation: Secondary | ICD-10-CM | POA: Diagnosis not present

## 2018-07-11 DIAGNOSIS — I130 Hypertensive heart and chronic kidney disease with heart failure and stage 1 through stage 4 chronic kidney disease, or unspecified chronic kidney disease: Secondary | ICD-10-CM | POA: Diagnosis not present

## 2018-07-11 DIAGNOSIS — Z23 Encounter for immunization: Secondary | ICD-10-CM | POA: Diagnosis not present

## 2018-07-11 DIAGNOSIS — M545 Low back pain: Secondary | ICD-10-CM | POA: Diagnosis not present

## 2018-10-23 DIAGNOSIS — Z681 Body mass index (BMI) 19 or less, adult: Secondary | ICD-10-CM | POA: Diagnosis not present

## 2018-10-23 DIAGNOSIS — M545 Low back pain: Secondary | ICD-10-CM | POA: Diagnosis not present

## 2018-12-14 DIAGNOSIS — I130 Hypertensive heart and chronic kidney disease with heart failure and stage 1 through stage 4 chronic kidney disease, or unspecified chronic kidney disease: Secondary | ICD-10-CM | POA: Diagnosis not present

## 2018-12-14 DIAGNOSIS — Z6820 Body mass index (BMI) 20.0-20.9, adult: Secondary | ICD-10-CM | POA: Diagnosis not present

## 2018-12-14 DIAGNOSIS — I480 Paroxysmal atrial fibrillation: Secondary | ICD-10-CM | POA: Diagnosis not present

## 2018-12-14 DIAGNOSIS — I8310 Varicose veins of unspecified lower extremity with inflammation: Secondary | ICD-10-CM | POA: Diagnosis not present

## 2018-12-14 DIAGNOSIS — Z7901 Long term (current) use of anticoagulants: Secondary | ICD-10-CM | POA: Diagnosis not present

## 2018-12-14 DIAGNOSIS — R600 Localized edema: Secondary | ICD-10-CM | POA: Diagnosis not present

## 2019-01-09 DIAGNOSIS — D692 Other nonthrombocytopenic purpura: Secondary | ICD-10-CM | POA: Diagnosis not present

## 2019-01-09 DIAGNOSIS — I130 Hypertensive heart and chronic kidney disease with heart failure and stage 1 through stage 4 chronic kidney disease, or unspecified chronic kidney disease: Secondary | ICD-10-CM | POA: Diagnosis not present

## 2019-01-09 DIAGNOSIS — R600 Localized edema: Secondary | ICD-10-CM | POA: Diagnosis not present

## 2019-01-09 DIAGNOSIS — Z7901 Long term (current) use of anticoagulants: Secondary | ICD-10-CM | POA: Diagnosis not present

## 2019-01-09 DIAGNOSIS — I429 Cardiomyopathy, unspecified: Secondary | ICD-10-CM | POA: Diagnosis not present

## 2019-01-09 DIAGNOSIS — I2510 Atherosclerotic heart disease of native coronary artery without angina pectoris: Secondary | ICD-10-CM | POA: Diagnosis not present

## 2019-01-09 DIAGNOSIS — R05 Cough: Secondary | ICD-10-CM | POA: Diagnosis not present

## 2019-01-09 DIAGNOSIS — I480 Paroxysmal atrial fibrillation: Secondary | ICD-10-CM | POA: Diagnosis not present

## 2019-01-09 DIAGNOSIS — I8310 Varicose veins of unspecified lower extremity with inflammation: Secondary | ICD-10-CM | POA: Diagnosis not present

## 2019-06-12 DIAGNOSIS — K56609 Unspecified intestinal obstruction, unspecified as to partial versus complete obstruction: Secondary | ICD-10-CM

## 2019-06-12 HISTORY — DX: Unspecified intestinal obstruction, unspecified as to partial versus complete obstruction: K56.609

## 2019-06-27 ENCOUNTER — Inpatient Hospital Stay (HOSPITAL_COMMUNITY)
Admission: EM | Admit: 2019-06-27 | Discharge: 2019-06-29 | DRG: 392 | Disposition: A | Discharge status: Home Health | Payer: Medicare HMO | Attending: Internal Medicine | Admitting: Internal Medicine

## 2019-06-27 ENCOUNTER — Emergency Department (HOSPITAL_COMMUNITY): Payer: Medicare HMO

## 2019-06-27 ENCOUNTER — Inpatient Hospital Stay (HOSPITAL_COMMUNITY): Payer: Medicare HMO

## 2019-06-27 ENCOUNTER — Encounter (HOSPITAL_COMMUNITY): Payer: Self-pay | Admitting: Emergency Medicine

## 2019-06-27 ENCOUNTER — Other Ambulatory Visit: Payer: Self-pay

## 2019-06-27 DIAGNOSIS — N179 Acute kidney failure, unspecified: Secondary | ICD-10-CM | POA: Diagnosis present

## 2019-06-27 DIAGNOSIS — M810 Age-related osteoporosis without current pathological fracture: Secondary | ICD-10-CM | POA: Diagnosis present

## 2019-06-27 DIAGNOSIS — R000 Tachycardia, unspecified: Secondary | ICD-10-CM | POA: Diagnosis not present

## 2019-06-27 DIAGNOSIS — Z9841 Cataract extraction status, right eye: Secondary | ICD-10-CM

## 2019-06-27 DIAGNOSIS — Z923 Personal history of irradiation: Secondary | ICD-10-CM | POA: Diagnosis not present

## 2019-06-27 DIAGNOSIS — K449 Diaphragmatic hernia without obstruction or gangrene: Secondary | ICD-10-CM | POA: Diagnosis not present

## 2019-06-27 DIAGNOSIS — I4891 Unspecified atrial fibrillation: Secondary | ICD-10-CM | POA: Diagnosis present

## 2019-06-27 DIAGNOSIS — K520 Gastroenteritis and colitis due to radiation: Secondary | ICD-10-CM | POA: Diagnosis not present

## 2019-06-27 DIAGNOSIS — I447 Left bundle-branch block, unspecified: Secondary | ICD-10-CM | POA: Diagnosis not present

## 2019-06-27 DIAGNOSIS — E876 Hypokalemia: Secondary | ICD-10-CM | POA: Diagnosis not present

## 2019-06-27 DIAGNOSIS — Z9842 Cataract extraction status, left eye: Secondary | ICD-10-CM | POA: Diagnosis not present

## 2019-06-27 DIAGNOSIS — I340 Nonrheumatic mitral (valve) insufficiency: Secondary | ICD-10-CM | POA: Diagnosis present

## 2019-06-27 DIAGNOSIS — R739 Hyperglycemia, unspecified: Secondary | ICD-10-CM | POA: Diagnosis not present

## 2019-06-27 DIAGNOSIS — Z880 Allergy status to penicillin: Secondary | ICD-10-CM

## 2019-06-27 DIAGNOSIS — K56609 Unspecified intestinal obstruction, unspecified as to partial versus complete obstruction: Secondary | ICD-10-CM | POA: Diagnosis present

## 2019-06-27 DIAGNOSIS — Y842 Radiological procedure and radiotherapy as the cause of abnormal reaction of the patient, or of later complication, without mention of misadventure at the time of the procedure: Secondary | ICD-10-CM | POA: Diagnosis present

## 2019-06-27 DIAGNOSIS — I213 ST elevation (STEMI) myocardial infarction of unspecified site: Secondary | ICD-10-CM | POA: Diagnosis not present

## 2019-06-27 DIAGNOSIS — Z8542 Personal history of malignant neoplasm of other parts of uterus: Secondary | ICD-10-CM | POA: Diagnosis not present

## 2019-06-27 DIAGNOSIS — E559 Vitamin D deficiency, unspecified: Secondary | ICD-10-CM | POA: Diagnosis present

## 2019-06-27 DIAGNOSIS — Z0189 Encounter for other specified special examinations: Secondary | ICD-10-CM

## 2019-06-27 DIAGNOSIS — R1013 Epigastric pain: Secondary | ICD-10-CM | POA: Diagnosis not present

## 2019-06-27 DIAGNOSIS — K440 Diaphragmatic hernia with obstruction, without gangrene: Principal | ICD-10-CM | POA: Diagnosis present

## 2019-06-27 DIAGNOSIS — Z79899 Other long term (current) drug therapy: Secondary | ICD-10-CM | POA: Diagnosis not present

## 2019-06-27 DIAGNOSIS — Z66 Do not resuscitate: Secondary | ICD-10-CM | POA: Diagnosis present

## 2019-06-27 DIAGNOSIS — R079 Chest pain, unspecified: Secondary | ICD-10-CM | POA: Diagnosis not present

## 2019-06-27 DIAGNOSIS — Z7901 Long term (current) use of anticoagulants: Secondary | ICD-10-CM | POA: Diagnosis not present

## 2019-06-27 DIAGNOSIS — Z90710 Acquired absence of both cervix and uterus: Secondary | ICD-10-CM

## 2019-06-27 DIAGNOSIS — G629 Polyneuropathy, unspecified: Secondary | ICD-10-CM | POA: Diagnosis present

## 2019-06-27 DIAGNOSIS — I499 Cardiac arrhythmia, unspecified: Secondary | ICD-10-CM | POA: Diagnosis not present

## 2019-06-27 DIAGNOSIS — Z79890 Hormone replacement therapy (postmenopausal): Secondary | ICD-10-CM

## 2019-06-27 DIAGNOSIS — N281 Cyst of kidney, acquired: Secondary | ICD-10-CM | POA: Diagnosis not present

## 2019-06-27 DIAGNOSIS — I480 Paroxysmal atrial fibrillation: Secondary | ICD-10-CM | POA: Diagnosis not present

## 2019-06-27 DIAGNOSIS — K5730 Diverticulosis of large intestine without perforation or abscess without bleeding: Secondary | ICD-10-CM | POA: Diagnosis not present

## 2019-06-27 DIAGNOSIS — E039 Hypothyroidism, unspecified: Secondary | ICD-10-CM | POA: Diagnosis not present

## 2019-06-27 DIAGNOSIS — K5669 Other partial intestinal obstruction: Secondary | ICD-10-CM | POA: Diagnosis not present

## 2019-06-27 DIAGNOSIS — E870 Hyperosmolality and hypernatremia: Secondary | ICD-10-CM | POA: Diagnosis not present

## 2019-06-27 DIAGNOSIS — Z20828 Contact with and (suspected) exposure to other viral communicable diseases: Secondary | ICD-10-CM | POA: Diagnosis not present

## 2019-06-27 DIAGNOSIS — R1084 Generalized abdominal pain: Secondary | ICD-10-CM | POA: Diagnosis not present

## 2019-06-27 DIAGNOSIS — Z4659 Encounter for fitting and adjustment of other gastrointestinal appliance and device: Secondary | ICD-10-CM

## 2019-06-27 DIAGNOSIS — Z885 Allergy status to narcotic agent status: Secondary | ICD-10-CM

## 2019-06-27 DIAGNOSIS — Z4682 Encounter for fitting and adjustment of non-vascular catheter: Secondary | ICD-10-CM | POA: Diagnosis not present

## 2019-06-27 LAB — COMPREHENSIVE METABOLIC PANEL
ALT: 17 U/L (ref 0–44)
AST: 35 U/L (ref 15–41)
Albumin: 3.8 g/dL (ref 3.5–5.0)
Alkaline Phosphatase: 70 U/L (ref 38–126)
Anion gap: 12 (ref 5–15)
BUN: 19 mg/dL (ref 8–23)
CO2: 20 mmol/L — ABNORMAL LOW (ref 22–32)
Calcium: 9.8 mg/dL (ref 8.9–10.3)
Chloride: 109 mmol/L (ref 98–111)
Creatinine, Ser: 1.11 mg/dL — ABNORMAL HIGH (ref 0.44–1.00)
GFR calc Af Amer: 49 mL/min — ABNORMAL LOW (ref >60)
GFR calc non Af Amer: 42 mL/min — ABNORMAL LOW (ref >60)
Glucose, Bld: 220 mg/dL — ABNORMAL HIGH (ref 70–99)
Potassium: 4.1 mmol/L (ref 3.5–5.1)
Sodium: 141 mmol/L (ref 135–145)
Total Bilirubin: 0.7 mg/dL (ref 0.3–1.2)
Total Protein: 6.9 g/dL (ref 6.5–8.1)

## 2019-06-27 LAB — POCT I-STAT EG7
Acid-base deficit: 5 mmol/L — ABNORMAL HIGH (ref 0.0–2.0)
Bicarbonate: 21.7 mmol/L (ref 20.0–28.0)
Calcium, Ion: 1.35 mmol/L (ref 1.15–1.40)
HCT: 44 % (ref 36.0–46.0)
Hemoglobin: 15 g/dL (ref 12.0–15.0)
O2 Saturation: 99 %
Patient temperature: 37
Potassium: 4.4 mmol/L (ref 3.5–5.1)
Sodium: 143 mmol/L (ref 135–145)
TCO2: 23 mmol/L (ref 22–32)
pCO2, Ven: 44.2 mmHg (ref 44.0–60.0)
pH, Ven: 7.299 (ref 7.250–7.430)
pO2, Ven: 141 mmHg — ABNORMAL HIGH (ref 32.0–45.0)

## 2019-06-27 LAB — APTT: aPTT: 80 seconds — ABNORMAL HIGH (ref 24–36)

## 2019-06-27 LAB — CBC
HCT: 40.1 % (ref 36.0–46.0)
Hemoglobin: 13.5 g/dL (ref 12.0–15.0)
MCH: 31.8 pg (ref 26.0–34.0)
MCHC: 33.7 g/dL (ref 30.0–36.0)
MCV: 94.6 fL (ref 80.0–100.0)
Platelets: 324 10*3/uL (ref 150–400)
RBC: 4.24 MIL/uL (ref 3.87–5.11)
RDW: 13.6 % (ref 11.5–15.5)
WBC: 13.8 10*3/uL — ABNORMAL HIGH (ref 4.0–10.5)
nRBC: 0 % (ref 0.0–0.2)

## 2019-06-27 LAB — CBC WITH DIFFERENTIAL/PLATELET
Abs Immature Granulocytes: 0.07 10*3/uL (ref 0.00–0.07)
Basophils Absolute: 0 10*3/uL (ref 0.0–0.1)
Basophils Relative: 0 %
Eosinophils Absolute: 0 10*3/uL (ref 0.0–0.5)
Eosinophils Relative: 0 %
HCT: 43.9 % (ref 36.0–46.0)
Hemoglobin: 13.8 g/dL (ref 12.0–15.0)
Immature Granulocytes: 1 %
Lymphocytes Relative: 11 %
Lymphs Abs: 1.3 10*3/uL (ref 0.7–4.0)
MCH: 30.3 pg (ref 26.0–34.0)
MCHC: 31.4 g/dL (ref 30.0–36.0)
MCV: 96.5 fL (ref 80.0–100.0)
Monocytes Absolute: 0.3 10*3/uL (ref 0.1–1.0)
Monocytes Relative: 2 %
Neutro Abs: 10.1 10*3/uL — ABNORMAL HIGH (ref 1.7–7.7)
Neutrophils Relative %: 86 %
Platelets: 397 10*3/uL (ref 150–400)
RBC: 4.55 MIL/uL (ref 3.87–5.11)
RDW: 13.7 % (ref 11.5–15.5)
WBC: 11.8 10*3/uL — ABNORMAL HIGH (ref 4.0–10.5)
nRBC: 0 % (ref 0.0–0.2)

## 2019-06-27 LAB — LIPASE, BLOOD: Lipase: 41 U/L (ref 11–51)

## 2019-06-27 LAB — BASIC METABOLIC PANEL
Anion gap: 12 (ref 5–15)
BUN: 20 mg/dL (ref 8–23)
CO2: 18 mmol/L — ABNORMAL LOW (ref 22–32)
Calcium: 9.3 mg/dL (ref 8.9–10.3)
Chloride: 111 mmol/L (ref 98–111)
Creatinine, Ser: 0.96 mg/dL (ref 0.44–1.00)
GFR calc Af Amer: 58 mL/min — ABNORMAL LOW (ref >60)
GFR calc non Af Amer: 50 mL/min — ABNORMAL LOW (ref >60)
Glucose, Bld: 141 mg/dL — ABNORMAL HIGH (ref 70–99)
Potassium: 3.9 mmol/L (ref 3.5–5.1)
Sodium: 141 mmol/L (ref 135–145)

## 2019-06-27 LAB — I-STAT CREATININE, ED: Creatinine, Ser: 1.1 mg/dL — ABNORMAL HIGH (ref 0.44–1.00)

## 2019-06-27 LAB — TROPONIN I (HIGH SENSITIVITY)
Troponin I (High Sensitivity): 9 ng/L (ref <18)
Troponin I (High Sensitivity): 9 ng/L (ref <18)

## 2019-06-27 LAB — HEPARIN LEVEL (UNFRACTIONATED): Heparin Unfractionated: 0.76 IU/mL — ABNORMAL HIGH (ref 0.30–0.70)

## 2019-06-27 LAB — SARS CORONAVIRUS 2 (TAT 6-24 HRS): SARS Coronavirus 2: NEGATIVE

## 2019-06-27 MED ORDER — ONDANSETRON HCL 4 MG PO TABS: 4.0000 mg | ORAL_TABLET | Freq: Four times a day (QID) | ORAL | Status: DC | PRN

## 2019-06-27 MED ORDER — LIDOCAINE VISCOUS HCL 2 % MT SOLN
15.0000 mL | Freq: Once | OROMUCOSAL | Status: AC
  Administered 2019-06-27: 15 mL via OROMUCOSAL

## 2019-06-27 MED ORDER — LIDOCAINE VISCOUS HCL 2 % MT SOLN
15.0000 mL | Freq: Once | OROMUCOSAL | Status: AC
  Administered 2019-06-27: 15 mL via OROMUCOSAL
  Filled 2019-06-27: qty 15

## 2019-06-27 MED ORDER — DIATRIZOATE MEGLUMINE & SODIUM 66-10 % PO SOLN
90.0000 mL | Freq: Once | ORAL | Status: AC
  Administered 2019-06-27: 90 mL via NASOGASTRIC
  Filled 2019-06-27 (×2): qty 90

## 2019-06-27 MED ORDER — ONDANSETRON HCL 4 MG/2ML IJ SOLN: 4.0000 mg | Freq: Four times a day (QID) | INTRAMUSCULAR | Status: DC | PRN

## 2019-06-27 MED ORDER — IOHEXOL 300 MG/ML  SOLN
100.0000 mL | Freq: Once | INTRAMUSCULAR | Status: AC | PRN
  Administered 2019-06-27: 100 mL via INTRAVENOUS

## 2019-06-27 MED ORDER — HYDROCOD POLST-CPM POLST ER 10-8 MG/5ML PO SUER
5.0000 mL | Freq: Once | ORAL | Status: AC
  Administered 2019-06-27: 5 mL via ORAL
  Filled 2019-06-27: qty 5

## 2019-06-27 MED ORDER — METOPROLOL TARTRATE 5 MG/5ML IV SOLN
2.5000 mg | Freq: Three times a day (TID) | INTRAVENOUS | Status: DC
  Administered 2019-06-27 – 2019-06-29 (×6): 2.5 mg via INTRAVENOUS
  Filled 2019-06-27 (×6): qty 5

## 2019-06-27 MED ORDER — ONDANSETRON HCL 4 MG/2ML IJ SOLN
4.0000 mg | Freq: Once | INTRAMUSCULAR | Status: AC
  Administered 2019-06-27: 4 mg via INTRAVENOUS
  Filled 2019-06-27: qty 2

## 2019-06-27 MED ORDER — ACETAMINOPHEN 650 MG RE SUPP: 650.0000 mg | Freq: Four times a day (QID) | RECTAL | Status: DC | PRN

## 2019-06-27 MED ORDER — LORAZEPAM 2 MG/ML IJ SOLN
0.5000 mg | Freq: Once | INTRAMUSCULAR | Status: AC
  Administered 2019-06-27: 0.5 mg via INTRAVENOUS
  Filled 2019-06-27: qty 1

## 2019-06-27 MED ORDER — SODIUM CHLORIDE 0.9 % IV BOLUS
1000.0000 mL | Freq: Once | INTRAVENOUS | Status: AC
  Administered 2019-06-27: 1000 mL via INTRAVENOUS

## 2019-06-27 MED ORDER — ACETAMINOPHEN 325 MG PO TABS
650.0000 mg | ORAL_TABLET | Freq: Four times a day (QID) | ORAL | Status: DC | PRN
  Administered 2019-06-28: 650 mg via ORAL
  Filled 2019-06-27: qty 2

## 2019-06-27 MED ORDER — LEVOTHYROXINE SODIUM 100 MCG/5ML IV SOLN
25.0000 ug | Freq: Every day | INTRAVENOUS | Status: DC
  Administered 2019-06-28 – 2019-06-29 (×2): 25 ug via INTRAVENOUS
  Filled 2019-06-27 (×2): qty 5

## 2019-06-27 MED ORDER — SODIUM CHLORIDE 0.9 % IV SOLN
INTRAVENOUS | Status: DC
  Administered 2019-06-27: 12:00:00 via INTRAVENOUS

## 2019-06-27 MED ORDER — HEPARIN (PORCINE) 25000 UT/250ML-% IV SOLN
550.0000 [IU]/h | INTRAVENOUS | Status: DC
  Administered 2019-06-27: 650 [IU]/h via INTRAVENOUS
  Filled 2019-06-27: qty 250

## 2019-06-27 NOTE — Progress Notes (Addendum)
Subjective: CC: SBO  Patient reports that her abdominal pain, abdominal distension, and nausea has resolved. She notes that she has not had emesis since being admitted. The patient's daughter, Caryl Pina is at bedside. She reports that before yesterday the patient has not had abdominal pain, N/V for some time. She does have daily episodes of diarrhea. She denies history of heartburn, burping, belching or indigestion after eating.   She walks at home with a walker and is not very mobile at baseline per the patient's daughter. She reports her prior surgeries were 25 years ago at [facility]. She is unsure who the surgeon was.   Objective: Vital signs in last 24 hours: Temp:  [97.9 F (36.6 C)-98.1 F (36.7 C)] 97.9 F (36.6 C) (09/16 0806) Pulse Rate:  [85-113] 85 (09/16 0806) Resp:  [12-26] 18 (09/16 0806) BP: (100-168)/(70-97) 161/75 (09/16 0806) SpO2:  [93 %-100 %] 100 % (09/16 0806)    Intake/Output from previous day: 09/15 0701 - 09/16 0700 In: 1000 [IV Piggyback:1000] Out: -  Intake/Output this shift: No intake/output data recorded.  PE: Gen:  Frail elderly female lying in bed in NAD Lungs: Normal rate and effort Abd: Soft, NT/ND, +BS. Prior vertical midline scar below her umbilicus.  Ext:  No edema Skin: Bruising on arms   Lab Results:  Recent Labs    06/27/19 0049 06/27/19 0122 06/27/19 0446  WBC 11.8*  --  13.8*  HGB 13.8 15.0 13.5  HCT 43.9 44.0 40.1  PLT 397  --  324   BMET Recent Labs    06/27/19 0049 06/27/19 0121 06/27/19 0122 06/27/19 0446  NA 141  --  143 141  K 4.1  --  4.4 3.9  CL 109  --   --  111  CO2 20*  --   --  18*  GLUCOSE 220*  --   --  141*  BUN 19  --   --  20  CREATININE 1.11* 1.10*  --  0.96  CALCIUM 9.8  --   --  9.3   PT/INR No results for input(s): LABPROT, INR in the last 72 hours. CMP     Component Value Date/Time   NA 141 06/27/2019 0446   K 3.9 06/27/2019 0446   CL 111 06/27/2019 0446   CO2 18 (L) 06/27/2019 0446   GLUCOSE 141 (H) 06/27/2019 0446   BUN 20 06/27/2019 0446   CREATININE 0.96 06/27/2019 0446   CALCIUM 9.3 06/27/2019 0446   PROT 6.9 06/27/2019 0049   ALBUMIN 3.8 06/27/2019 0049   AST 35 06/27/2019 0049   ALT 17 06/27/2019 0049   ALKPHOS 70 06/27/2019 0049   BILITOT 0.7 06/27/2019 0049   GFRNONAA 50 (L) 06/27/2019 0446   GFRAA 58 (L) 06/27/2019 0446   Lipase     Component Value Date/Time   LIPASE 41 06/27/2019 0049       Studies/Results: Ct Abdomen Pelvis W Contrast  Result Date: 06/27/2019 CLINICAL DATA:  Right-sided abdominal pain EXAM: CT ABDOMEN AND PELVIS WITH CONTRAST TECHNIQUE: Multidetector CT imaging of the abdomen and pelvis was performed using the standard protocol following bolus administration of intravenous contrast. CONTRAST:  11mL OMNIPAQUE IOHEXOL 300 MG/ML  SOLN COMPARISON:  May 22, 2016 FINDINGS: Lower chest: Basilar areas of scarring and some mild subpleural reticulation with areas of dependent atelectasis. Posterior border of the heart is compressed by the large hiatal hernia. Atherosclerotic calcification of the coronary arteries. No visible pericardial effusion. Hepatobiliary: Diminutive appearance of  the left lobe liver relative to the right, nonspecific. No focal hepatic abnormality. Persistent moderate biliary ductal dilatation without visible gallstones. No pericholecystic inflammation. Pancreas: Atrophic appearance of the pancreas. No ductal dilatation or peripancreatic inflammation. Spleen: Normal in size without focal abnormality. Adrenals/Urinary Tract: Simple attenuation 3 cm cyst in the interpolar right kidney. Additional intermediate attenuation (30-50 HU) cysts in both kidneys are without significant enhancement or washout on excretory delays favoring hyperdense cysts. Additional subcentimeter hypoattenuating foci are too small to fully characterize on CT imaging but statistically likely benign. No hydronephrosis. No obstructive urolithiasis. Urinary  bladder is unremarkable. Stomach/Bowel: There is marked fluid distention of the now large hiatal hernia and stomach as well as the proximal small bowel to the level of a somewhat gradual transition point in the deep pelvis (3/61). More distal small bowel is normal caliber. Hyperdense material is seen dependently within the stomach. Does not appear to diffuse on delayed excretory phase imaging. Favors ingested material. Postsurgical changes at the cecal tip suggest prior appendectomy and possible ileocolic anastomosis. Colon is fluid-filled through much of its extent with mild mucosal hyperemia. Vascular/Lymphatic: Extensive severe atherosclerotic calcification is seen throughout the abdominal vasculature. No suspicious or enlarged lymph nodes in the included lymphatic chains. Reproductive: Uterus is surgically absent. No concerning adnexal lesions. Other: No abdominopelvic free fluid or free gas. No bowel containing hernias. Postsurgical changes of the low anterior abdomen. Musculoskeletal: Dextrocurvature of the thoracolumbar spine with an apex at L2. Mild lateral listhesis of L1 on L2. Compression deformities of L5 and T11 are age indeterminate but new since 2017. IMPRESSION: 1. Marked fluid distention of the now large hiatal hernia and stomach as well as the proximal small bowel to the level of a somewhat gradual transition point in the deep pelvis, concerning for small bowel obstruction. Possibly related to an adhesion given postsurgical changes in the low abdomen. 2. Small amount of hyperdense material in the stomach, favor ingested material/medication such as bismuth containing products. 3. Colon is fluid-filled through much of its extent with mild mucosal hyperemia, suggestive of colitis. 4. Postsurgical changes at the cecal tip suggesting prior appendectomy and possible ileocolic anastomosis. Correlate with patient surgical history. 5. Compression deformities of L5 and T11 are age indeterminate but new since  2017. Correlate for point tenderness to assess for acuity. 6. Aortic Atherosclerosis (ICD10-I70.0). Electronically Signed   By: Lovena Le M.D.   On: 06/27/2019 02:33   Dg Chest Port 1 View  Result Date: 06/27/2019 CLINICAL DATA:  85 year old female with chest pain. EXAM: PORTABLE CHEST 1 VIEW COMPARISON:  Chest radiographs 02/18/2017 and earlier. FINDINGS: Portable AP semi upright view at 0104 hours. Stable cardiac size and mediastinal contours. Tortuous thoracic aorta. Calcified aortic atherosclerosis. Visualized tracheal air column is within normal limits. Allowing for portable technique the lungs are clear. No pleural effusion or pneumothorax. Paucity of bowel gas in the upper abdomen. No acute osseous abnormality identified. IMPRESSION: No acute cardiopulmonary abnormality. Aortic Atherosclerosis (ICD10-I70.0). Electronically Signed   By: Genevie Ann M.D.   On: 06/27/2019 01:36    Anti-infectives: Anti-infectives (From admission, onward)   None       Assessment/Plan Hypothyroidism A Fib - Hold home Xarelto. 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 Colitis on CT  SBO - Hx of Hysterectomy, SBR and radiation for her uterine cancer - Large Hiatal hernia and Stomach on CT. This has been present since 2017. The proximal small bowel is also  dilated. Questionable transition point in pelvis.  - NGT to be placed by Fluoro - SBO protocol - Keep Mg >2 and K >4 for bowel function - Mobilize for bowel function. PT ordered.   FEN - NPO VTE - SCDs, Heparin per pharmacy  ID - None   LOS: 0 days    Jillyn Ledger , [facility] Surgery 06/27/2019, 11:27 AM Pager: [phone]  Agree with above. Looks like NGT is in place.  With massive stomach, she's had some chronic issue.  Her daughter, Glady Outten, is in the room.  Ms. Dewan husband died this past summer at the age of 19.  She has one other daughter.  She lives at home with  24 hour sitters. She usually has several loose stools a day because of a small bowel resection 20+ years ago.  Alphonsa Overall, MD, [facility] Surgery Pager: [phone] Office phone:  [phone]

## 2019-06-27 NOTE — H&P (Signed)
History and Physical    Teresa Gonzalez [id number] DOB: 1921-11-25 DOA: 06/27/2019  PCP: Shon Baton, MD  Patient coming from: Home.  Chief Complaint: Nausea vomiting.  Abdominal pain.  HPI: Teresa Gonzalez is a 85 y.o. female with history of atrial fibrillation, hypothyroidism mitral regurgitation presents to the ER with complaint of sudden onset of abdominal pain and nausea vomiting since yesterday afternoon 2 PM.  Abdominal pain mostly in the epigastric area.  Since pain persisted along with nausea vomiting patient present to the ER.  Epigastric pain radiated to her chest presently chest pain-free.  ED Course: In the ER CT abdomen pelvis shows features concerning for small bowel obstruction and on-call general surgeon was consulted.  NG tube was attempted to be placed but was not successful.  Patient's labs show blood sugar of 220 creatinine 1.1 WBC 11.8 platelets 397.  Troponin was 9 and 9.  Initially there was some concern for acute MI but since troponins were negative patient's pain was more epigastric and ER physician discussed with cardiologist patient was ruled out.  Review of Systems: As per HPI, rest all negative.   Past Medical History:  Diagnosis Date   Atrial fibrillation ([place])    Depression    Depression    Hypothyroidism    Insomnia    Left carpal tunnel syndrome    By nerve conduction study   Peripheral neuropathy    S/P small bowel resection    with Diarrhea   Status post radiation therapy    Uterine cancer ([place])    Vitamin D deficiency     Past Surgical History:  Procedure Laterality Date   ABDOMINAL HYSTERECTOMY     CATARACT EXTRACTION Bilateral      reports that she has never smoked. She has never used smokeless tobacco. She reports that she does not drink alcohol or use drugs.  Allergies -penicillin.  Family History  Problem Relation Age of Onset   Stroke Mother    Aneurysm Father    Diabetes Brother     Prior to Admission  medications   Medication Sig Start Date End Date Taking? Authorizing Provider  Calcium Carbonate (CALTRATE 600 PO) Take 600 mg by mouth 3 (three) times daily.    Yes [provider]  Cholecalciferol (VITAMIN D3) 1000 units CAPS Take 1,000 Units by mouth daily.   Yes [provider]  furosemide (LASIX) 20 MG tablet Take 20 mg by mouth daily.   Yes [provider]  gabapentin (NEURONTIN) 100 MG capsule Take 100 mg by mouth 2 (two) times daily as needed (nerve pain).   Yes [provider]  loperamide (IMODIUM) 2 MG capsule Take 1 capsule (2 mg total) by mouth 5 (five) times daily as needed for diarrhea or loose stools. 10/23/13  Yes Shon Baton, MD  LORazepam (ATIVAN) 0.5 MG tablet Take 0.5 mg by mouth daily as needed for anxiety. 01/26/17  Yes [provider]  magnesium oxide (MAG-OX) 400 (241.3 Mg) MG tablet Take 1 tablet (400 mg total) by mouth daily. 04/01/17  Yes Nita Sells, MD  meclizine (ANTIVERT) 12.5 MG tablet Take 12.5 mg by mouth 3 (three) times daily as needed for dizziness.   Yes [provider]  methocarbamol (ROBAXIN) 500 MG tablet Take 500 mg by mouth every 8 (eight) hours as needed for muscle spasms.   Yes [provider]  metoprolol tartrate (LOPRESSOR) 25 MG tablet Take 1 tablet (25 mg total) by mouth 2 (two) times daily. 08/06/15  Yes Cherene Altes, MD  Multiple Vitamins-Minerals (MULTIVITAMIN WITH MINERALS) tablet Take 1 tablet by mouth daily.   Yes [provider]  potassium chloride SA (K-DUR,KLOR-CON) 20 MEQ tablet Take 2 tablets (40 mEq total) by mouth daily. Patient taking differently: Take 20 mEq by mouth 2 (two) times daily.  04/01/17  Yes Nita Sells, MD  raloxifene (EVISTA) 60 MG tablet Take 60 mg by mouth daily.   Yes [provider]  rivaroxaban (XARELTO) 10 MG TABS tablet Take 10 mg by mouth daily.   Yes [provider]  SYNTHROID 50 MCG tablet Take 50 mcg by  mouth daily. 07/01/15  Yes [provider]  VITAMIN B1-B12 IJ Inject 1 mL into the skin every 21 ( twenty-one) days.    Yes [provider]  Vitamin D, Ergocalciferol, (DRISDOL) 50000 UNITS CAPS Take 50,000 Units by mouth See admin instructions. Takes on Monday, Wednesday, Friday   Yes [provider]    Physical Exam: Constitutional: Moderately built and nourished. Vitals:   06/27/19 0115 06/27/19 0130 06/27/19 0150 06/27/19 0326  BP: 126/73 138/78 (!) 158/86 (!) 164/97  Pulse: 94 94 96 (!) 113  Resp: 19 20 (!) 23 (!) 22  Temp:      TempSrc:      SpO2: 99% 99% 99% 96%   Eyes: Anicteric no pallor. ENMT: No discharge from the ears eyes nose or mouth. Neck: No mass felt.  No neck rigidity. Respiratory: No rhonchi or crepitations. Cardiovascular: S1-S2 heard. Abdomen: Soft nontender bowel sounds not appreciated. Musculoskeletal: No edema. Skin: No rash. Neurologic: Alert awake oriented to time place and person.  Moves all extremities. Psychiatric: Appears normal.   Labs on Admission: I have personally reviewed following labs and imaging studies  CBC: Recent Labs  Lab 06/27/19 0049 06/27/19 0122  WBC 11.8*  --   NEUTROABS 10.1*  --   HGB 13.8 15.0  HCT 43.9 44.0  MCV 96.5  --   PLT 397  --    Basic Metabolic Panel: Recent Labs  Lab 06/27/19 0049 06/27/19 0121 06/27/19 0122  NA 141  --  143  K 4.1  --  4.4  CL 109  --   --   CO2 20*  --   --   GLUCOSE 220*  --   --   BUN 19  --   --   CREATININE 1.11* 1.10*  --   CALCIUM 9.8  --   --    GFR: CrCl cannot be calculated (Unknown ideal weight.). Liver Function Tests: Recent Labs  Lab 06/27/19 0049  AST 35  ALT 17  ALKPHOS 70  BILITOT 0.7  PROT 6.9  ALBUMIN 3.8   Recent Labs  Lab 06/27/19 0049  LIPASE 41   No results for input(s): AMMONIA in the last 168 hours. Coagulation Profile: No results for input(s): INR, PROTIME in the last 168 hours. Cardiac Enzymes: No results for  input(s): CKTOTAL, CKMB, CKMBINDEX, TROPONINI in the last 168 hours. BNP (last 3 results) No results for input(s): PROBNP in the last 8760 hours. HbA1C: No results for input(s): HGBA1C in the last 72 hours. CBG: No results for input(s): GLUCAP in the last 168 hours. Lipid Profile: No results for input(s): CHOL, HDL, LDLCALC, TRIG, CHOLHDL, LDLDIRECT in the last 72 hours. Thyroid Function Tests: No results for input(s): TSH, T4TOTAL, FREET4, T3FREE, THYROIDAB in the last 72 hours. Anemia Panel: No results for input(s): VITAMINB12, FOLATE, FERRITIN, TIBC, IRON, RETICCTPCT in the last 72 hours.  Urine analysis:    Component Value Date/Time   COLORURINE YELLOW 03/30/2017 2051   APPEARANCEUR HAZY (A) 03/30/2017 2051   LABSPEC 1.011 03/30/2017 2051   PHURINE 5.0 03/30/2017 2051   GLUCOSEU NEGATIVE 03/30/2017 2051   HGBUR NEGATIVE 03/30/2017 2051   [place] NEGATIVE 03/30/2017 2051   KETONESUR 5 (A) 03/30/2017 2051   PROTEINUR NEGATIVE 03/30/2017 2051   UROBILINOGEN 0.2 08/01/2015 0125   NITRITE NEGATIVE 03/30/2017 2051   LEUKOCYTESUR TRACE (A) 03/30/2017 2051   Sepsis Labs: @LABRCNTIP (procalcitonin:4,lacticidven:4) )No results found for this or any previous visit (from the past 240 hour(s)).   Radiological Exams on Admission: Ct Abdomen Pelvis W Contrast  Result Date: 06/27/2019 CLINICAL DATA:  Right-sided abdominal pain EXAM: CT ABDOMEN AND PELVIS WITH CONTRAST TECHNIQUE: Multidetector CT imaging of the abdomen and pelvis was performed using the standard protocol following bolus administration of intravenous contrast. CONTRAST:  179mL OMNIPAQUE IOHEXOL 300 MG/ML  SOLN COMPARISON:  May 22, 2016 FINDINGS: Lower chest: Basilar areas of scarring and some mild subpleural reticulation with areas of dependent atelectasis. Posterior border of the heart is compressed by the large hiatal hernia. Atherosclerotic calcification of the coronary arteries. No visible pericardial effusion.  Hepatobiliary: Diminutive appearance of the left lobe liver relative to the right, nonspecific. No focal hepatic abnormality. Persistent moderate biliary ductal dilatation without visible gallstones. No pericholecystic inflammation. Pancreas: Atrophic appearance of the pancreas. No ductal dilatation or peripancreatic inflammation. Spleen: Normal in size without focal abnormality. Adrenals/Urinary Tract: Simple attenuation 3 cm cyst in the interpolar right kidney. Additional intermediate attenuation (30-50 HU) cysts in both kidneys are without significant enhancement or washout on excretory delays favoring hyperdense cysts. Additional subcentimeter hypoattenuating foci are too small to fully characterize on CT imaging but statistically likely benign. No hydronephrosis. No obstructive urolithiasis. Urinary bladder is unremarkable. Stomach/Bowel: There is marked fluid distention of the now large hiatal hernia and stomach as well as the proximal small bowel to the level of a somewhat gradual transition point in the deep pelvis (3/61). More distal small bowel is normal caliber. Hyperdense material is seen dependently within the stomach. Does not appear to diffuse on delayed excretory phase imaging. Favors ingested material. Postsurgical changes at the cecal tip suggest prior appendectomy and possible ileocolic anastomosis. Colon is fluid-filled through much of its extent with mild mucosal hyperemia. Vascular/Lymphatic: Extensive severe atherosclerotic calcification is seen throughout the abdominal vasculature. No suspicious or enlarged lymph nodes in the included lymphatic chains. Reproductive: Uterus is surgically absent. No concerning adnexal lesions. Other: No abdominopelvic free fluid or free gas. No bowel containing hernias. Postsurgical changes of the low anterior abdomen. Musculoskeletal: Dextrocurvature of the thoracolumbar spine with an apex at L2. Mild lateral listhesis of L1 on L2. Compression deformities of  L5 and T11 are age indeterminate but new since 2017. IMPRESSION: 1. Marked fluid distention of the now large hiatal hernia and stomach as well as the proximal small bowel to the level of a somewhat gradual transition point in the deep pelvis, concerning for small bowel obstruction. Possibly related to an adhesion given postsurgical changes in the low abdomen. 2. Small amount of hyperdense material in the stomach, favor ingested material/medication such as bismuth containing products. 3. Colon is fluid-filled through much of its extent with mild mucosal hyperemia, suggestive of colitis. 4. Postsurgical changes at the cecal tip suggesting prior appendectomy and possible ileocolic anastomosis. Correlate with patient surgical history. 5. Compression deformities of L5 and T11 are age indeterminate but new since 2017. Correlate for point  tenderness to assess for acuity. 6. Aortic Atherosclerosis (ICD10-I70.0). Electronically Signed   By: Lovena Le M.D.   On: 06/27/2019 02:33   Dg Chest Port 1 View  Result Date: 06/27/2019 CLINICAL DATA:  85 year old female with chest pain. EXAM: PORTABLE CHEST 1 VIEW COMPARISON:  Chest radiographs 02/18/2017 and earlier. FINDINGS: Portable AP semi upright view at 0104 hours. Stable cardiac size and mediastinal contours. Tortuous thoracic aorta. Calcified aortic atherosclerosis. Visualized tracheal air column is within normal limits. Allowing for portable technique the lungs are clear. No pleural effusion or pneumothorax. Paucity of bowel gas in the upper abdomen. No acute osseous abnormality identified. IMPRESSION: No acute cardiopulmonary abnormality. Aortic Atherosclerosis (ICD10-I70.0). Electronically Signed   By: Genevie Ann M.D.   On: 06/27/2019 01:36    EKG: Independently reviewed.  Sinus tachycardia.  Assessment/Plan Principal Problem:   SBO (small bowel obstruction) (HCC) Active Problems:   Atrial fibrillation (HCC)   Hypothyroidism   Hyperglycemia    1. Small  bowel obstruction -appreciate general surgery consult.  We will keep patient n.p.o. we will recheck KUB in the morning.  Will need to reattempt placing NG tube in the morning if not possibly related radiology consult.  IV fluids.  P.o.  Pain relief medications. 2. A. Fib -will closely monitor in telemetry.  Will hold Xarelto as recommended by surgery in case patient will need surgery and patient is consenting.  We will keep patient on scheduled dose of IV metoprolol with holding orders.  Last dose of Xarelto taken was on September 15 morning. 3. Hypothyroidism we will keep patient on Synthroid IV until patient can take orally. 4. Acute renal failure likely from vomiting.  Gently hydrate follow metabolic panel. 5. Hyperglycemia -check hemoglobin A1c. 6. History of mitral regurgitation moderate per 2D echo done in 2016.  Patient will need close follow-up and will need to stay more than 2 midnights given that patient has small bowel obstruction will need inpatient status.   DVT prophylaxis: SCDs. Code Status: DNR confirmed with patient. Family Communication: Patient's family at the bedside. Disposition Plan: To be determined. Consults called: General surgery. Admission status: Inpatient.   Rise Patience MD [facility] Pager [phone].  If 7PM-7AM, please contact night-coverage [email or web address] Password [facility]  06/27/2019, 4:19 AM

## 2019-06-27 NOTE — ED Notes (Addendum)
Pt. had bowel movement, and was cleaned. Pt has been placed on a purewick.

## 2019-06-27 NOTE — Consult Note (Signed)
Reason for Consult: Bowel obstruction Referring Physician: [name] is an 85 y.o. female.  HPI: Patient is a 85 year old female, with a history of uterine cancer, status post hysterectomy, status post radiation therapy, status post small bowel resection.  In speaking with the patient's daughter she began having some nausea and vomiting yesterday.  According to her daughter patient has on and off nausea vomiting and diarrhea likely related to post radiation enteritis versus short gut. Upon evaluation the ER she underwent CT scan.  This was consistent with bowel obstruction as per report.  I did review the CT scan personally.  Patient also has a large hiatal hernia and dilated stomach.  Patient currently on Xarelto for A. fib.  General surgery was consulted for evaluation of bowel obstruction.  Past Medical History:  Diagnosis Date  . Atrial fibrillation ([place])   . Depression   . Depression   . Hypothyroidism   . Insomnia   . Left carpal tunnel syndrome    By nerve conduction study  . Peripheral neuropathy   . S/P small bowel resection    with Diarrhea  . Status post radiation therapy   . Uterine cancer ([place])   . Vitamin D deficiency     Past Surgical History:  Procedure Laterality Date  . ABDOMINAL HYSTERECTOMY    . CATARACT EXTRACTION Bilateral     Family History  Problem Relation Age of Onset  . Stroke Mother   . Aneurysm Father   . Diabetes Brother     Social History:  reports that she has never smoked. She has never used smokeless tobacco. She reports that she does not drink alcohol or use drugs.  Allergies:  Codeine Penicillins   Medications: I have reviewed the patient's current medications.  Results for orders placed or performed during the hospital encounter of 06/27/19 (from the past 48 hour(s))  CBC with Differential     Status: Abnormal   Collection Time: 06/27/19 12:49 AM  Result Value Ref Range   WBC 11.8 (H) 4.0 - 10.5 K/uL   RBC 4.55  3.87 - 5.11 MIL/uL   Hemoglobin 13.8 12.0 - 15.0 g/dL   HCT 43.9 36.0 - 46.0 %   MCV 96.5 80.0 - 100.0 fL   MCH 30.3 26.0 - 34.0 pg   MCHC 31.4 30.0 - 36.0 g/dL   RDW 13.7 11.5 - 15.5 %   Platelets 397 150 - 400 K/uL   nRBC 0.0 0.0 - 0.2 %   Neutrophils Relative % 86 %   Neutro Abs 10.1 (H) 1.7 - 7.7 K/uL   Lymphocytes Relative 11 %   Lymphs Abs 1.3 0.7 - 4.0 K/uL   Monocytes Relative 2 %   Monocytes Absolute 0.3 0.1 - 1.0 K/uL   Eosinophils Relative 0 %   Eosinophils Absolute 0.0 0.0 - 0.5 K/uL   Basophils Relative 0 %   Basophils Absolute 0.0 0.0 - 0.1 K/uL   Immature Granulocytes 1 %   Abs Immature Granulocytes 0.07 0.00 - 0.07 K/uL    Comment: Performed at [facility], [address], [place], [place] [address]  Comprehensive metabolic panel     Status: Abnormal   Collection Time: 06/27/19 12:49 AM  Result Value Ref Range   Sodium 141 135 - 145 mmol/L   Potassium 4.1 3.5 - 5.1 mmol/L    Comment: SLIGHT HEMOLYSIS   Chloride 109 98 - 111 mmol/L   CO2 20 (L) 22 - 32 mmol/L  Glucose, Bld 220 (H) 70 - 99 mg/dL   BUN 19 8 - 23 mg/dL   Creatinine, Ser 1.11 (H) 0.44 - 1.00 mg/dL   Calcium 9.8 8.9 - 10.3 mg/dL   Total Protein 6.9 6.5 - 8.1 g/dL   Albumin 3.8 3.5 - 5.0 g/dL   AST 35 15 - 41 U/L   ALT 17 0 - 44 U/L   Alkaline Phosphatase 70 38 - 126 U/L   Total Bilirubin 0.7 0.3 - 1.2 mg/dL   GFR calc non Af Amer 42 (L) >60 mL/min   GFR calc Af Amer 49 (L) >60 mL/min   Anion gap 12 5 - 15    Comment: Performed at [facility] [address], [place], [place] [address]  Lipase, blood     Status: None   Collection Time: 06/27/19 12:49 AM  Result Value Ref Range   Lipase 41 11 - 51 U/L    Comment: Performed at [facility], [place] [address], [place], [place] [address]  Troponin I (High Sensitivity)     Status: None   Collection Time: 06/27/19 12:49 AM  Result Value Ref Range   Troponin I (High Sensitivity) 9 <18 ng/L    Comment: (NOTE) Elevated high  sensitivity troponin I (hsTnI) values and significant  changes across serial measurements may suggest ACS but many other  chronic and acute conditions are known to elevate hsTnI results.  Refer to the Links section for chest pain algorithms and additional  guidance. Performed at [facility], [place] [address], [place], [place] [address]   I-Stat Creatinine, ED (not at [facility])     Status: Abnormal   Collection Time: 06/27/19  1:21 AM  Result Value Ref Range   Creatinine, Ser 1.10 (H) 0.44 - 1.00 mg/dL  POCT I-Stat EG7     Status: Abnormal   Collection Time: 06/27/19  1:22 AM  Result Value Ref Range   pH, Ven 7.299 7.250 - 7.430   pCO2, Ven 44.2 44.0 - 60.0 mmHg   pO2, Ven 141.0 (H) 32.0 - 45.0 mmHg   Bicarbonate 21.7 20.0 - 28.0 mmol/L   TCO2 23 22 - 32 mmol/L   O2 Saturation 99.0 %   Acid-base deficit 5.0 (H) 0.0 - 2.0 mmol/L   Sodium 143 135 - 145 mmol/L   Potassium 4.4 3.5 - 5.1 mmol/L   Calcium, Ion 1.35 1.15 - 1.40 mmol/L   HCT 44.0 36.0 - 46.0 %   Hemoglobin 15.0 12.0 - 15.0 g/dL   Patient temperature 37.0 C    Sample type VENOUS     Ct Abdomen Pelvis W Contrast  Result Date: 06/27/2019 CLINICAL DATA:  Right-sided abdominal pain EXAM: CT ABDOMEN AND PELVIS WITH CONTRAST TECHNIQUE: Multidetector CT imaging of the abdomen and pelvis was performed using the standard protocol following bolus administration of intravenous contrast. CONTRAST:  163mL OMNIPAQUE IOHEXOL 300 MG/ML  SOLN COMPARISON:  May 22, 2016 FINDINGS: Lower chest: Basilar areas of scarring and some mild subpleural reticulation with areas of dependent atelectasis. Posterior border of the heart is compressed by the large hiatal hernia. Atherosclerotic calcification of the coronary arteries. No visible pericardial effusion. Hepatobiliary: Diminutive appearance of the left lobe liver relative to the right, nonspecific. No focal hepatic abnormality. Persistent moderate biliary ductal dilatation without visible  gallstones. No pericholecystic inflammation. Pancreas: Atrophic appearance of the pancreas. No ductal dilatation or peripancreatic inflammation. Spleen: Normal in size without focal abnormality. Adrenals/Urinary Tract: Simple attenuation 3 cm cyst  in the interpolar right kidney. Additional intermediate attenuation (30-50 HU) cysts in both kidneys are without significant enhancement or washout on excretory delays favoring hyperdense cysts. Additional subcentimeter hypoattenuating foci are too small to fully characterize on CT imaging but statistically likely benign. No hydronephrosis. No obstructive urolithiasis. Urinary bladder is unremarkable. Stomach/Bowel: There is marked fluid distention of the now large hiatal hernia and stomach as well as the proximal small bowel to the level of a somewhat gradual transition point in the deep pelvis (3/61). More distal small bowel is normal caliber. Hyperdense material is seen dependently within the stomach. Does not appear to diffuse on delayed excretory phase imaging. Favors ingested material. Postsurgical changes at the cecal tip suggest prior appendectomy and possible ileocolic anastomosis. Colon is fluid-filled through much of its extent with mild mucosal hyperemia. Vascular/Lymphatic: Extensive severe atherosclerotic calcification is seen throughout the abdominal vasculature. No suspicious or enlarged lymph nodes in the included lymphatic chains. Reproductive: Uterus is surgically absent. No concerning adnexal lesions. Other: No abdominopelvic free fluid or free gas. No bowel containing hernias. Postsurgical changes of the low anterior abdomen. Musculoskeletal: Dextrocurvature of the thoracolumbar spine with an apex at L2. Mild lateral listhesis of L1 on L2. Compression deformities of L5 and T11 are age indeterminate but new since 2017. IMPRESSION: 1. Marked fluid distention of the now large hiatal hernia and stomach as well as the proximal small bowel to the level of a  somewhat gradual transition point in the deep pelvis, concerning for small bowel obstruction. Possibly related to an adhesion given postsurgical changes in the low abdomen. 2. Small amount of hyperdense material in the stomach, favor ingested material/medication such as bismuth containing products. 3. Colon is fluid-filled through much of its extent with mild mucosal hyperemia, suggestive of colitis. 4. Postsurgical changes at the cecal tip suggesting prior appendectomy and possible ileocolic anastomosis. Correlate with patient surgical history. 5. Compression deformities of L5 and T11 are age indeterminate but new since 2017. Correlate for point tenderness to assess for acuity. 6. Aortic Atherosclerosis (ICD10-I70.0). Electronically Signed   By: Lovena Le M.D.   On: 06/27/2019 02:33   Dg Chest Port 1 View  Result Date: 06/27/2019 CLINICAL DATA:  85 year old female with chest pain. EXAM: PORTABLE CHEST 1 VIEW COMPARISON:  Chest radiographs 02/18/2017 and earlier. FINDINGS: Portable AP semi upright view at 0104 hours. Stable cardiac size and mediastinal contours. Tortuous thoracic aorta. Calcified aortic atherosclerosis. Visualized tracheal air column is within normal limits. Allowing for portable technique the lungs are clear. No pleural effusion or pneumothorax. Paucity of bowel gas in the upper abdomen. No acute osseous abnormality identified. IMPRESSION: No acute cardiopulmonary abnormality. Aortic Atherosclerosis (ICD10-I70.0). Electronically Signed   By: Genevie Ann M.D.   On: 06/27/2019 01:36    Review of Systems  Constitutional: Negative for chills, fever and malaise/fatigue.  HENT: Negative for ear discharge, hearing loss and sore throat.   Eyes: Negative for blurred vision and discharge.  Respiratory: Negative for cough and shortness of breath.   Cardiovascular: Negative for chest pain, orthopnea and leg swelling.  Gastrointestinal: Positive for abdominal pain, diarrhea, nausea and vomiting.  Negative for constipation and heartburn.  Musculoskeletal: Negative for myalgias and neck pain.  Skin: Negative for itching and rash.  Neurological: Negative for dizziness, focal weakness, seizures and loss of consciousness.  Endo/Heme/Allergies: Negative for environmental allergies. Does not bruise/bleed easily.  Psychiatric/Behavioral: Negative for depression and suicidal ideas.  All other systems reviewed and are negative.  Blood pressure (!) 164/97,  pulse (!) 113, temperature 98.1 F (36.7 C), temperature source Oral, resp. rate (!) 22, SpO2 96 %. Physical Exam  Constitutional: She is oriented to person, place, and time. Vital signs are normal. She appears well-developed and well-nourished.  Conversant No acute distress  Eyes: Lids are normal. No scleral icterus.  No lid lag Moist conjunctiva  Neck: No tracheal tenderness present. No thyromegaly present.  No cervical lymphadenopathy  Cardiovascular: Normal rate, regular rhythm and intact distal pulses.  No murmur heard. Respiratory: Effort normal and breath sounds normal. She has no wheezes. She has no rales.  GI: Soft. She exhibits distension. There is no hepatosplenomegaly. There is abdominal tenderness (x4 quads). There is no rebound and no guarding. No hernia.  Neurological: She is alert and oriented to person, place, and time.  Normal gait and station  Skin: Skin is warm. No rash noted. No cyanosis. Nails show no clubbing.  Normal skin turgor  Psychiatric: Judgment normal.  Appropriate affect    Assessment/Plan: 85 year old female with small bowel obstruction History of uterine cancer Post radiation enteritis A. Fib  1.  Patient with bowel obstruction.  Will order small bowel obstruction protocol.  If NG tube is difficult to place patient may require radiology place NG tube via fluoroscopy. 2.  We will continue to follow along. 3.  Would recommend keeping off of her Xarelto in case patient does need surgery in the near  future.  Teresa Gonzalez 06/27/2019, 3:52 AM

## 2019-06-27 NOTE — ED Provider Notes (Signed)
[facility] EMERGENCY DEPARTMENT Provider Note   CSN: [id number] Arrival date & time: 06/27/19  0028     History   Chief Complaint Chief Complaint  Patient presents with   Chest Pain    HPI Teresa Gonzalez is a 85 y.o. female.     85 yo F with a chief complaint of chest pain.  This started about lunchtime today.  Epigastric and radiates down to her lower abdomen.  Nothing seems to make it better or worse.  Worsened this evening and so she called 911.  EMS was concerned for possible ST elevation and activated a code STEMI though this EKG appears similar to old and that code was canceled by the on-call cardiologist.  Patient's pain is persisted despite nitro and aspirin.  Patient has had some vomiting and diarrhea in route as well as has been coughing fairly consistently.  Patient states that she was well until afternoon today.  Denies fevers or chills.  Denies sick contacts.  Has had a small bowel obstruction in the past though she thinks this feels different.  The history is provided by the patient and the EMS personnel.  Illness Severity:  Moderate Onset quality:  Gradual Duration:  12 hours Timing:  Constant Progression:  Unchanged Chronicity:  New Associated symptoms: abdominal pain, chest pain, cough, diarrhea, nausea and vomiting   Associated symptoms: no congestion, no fever, no headaches, no myalgias, no rhinorrhea, no shortness of breath and no wheezing     Past Medical History:  Diagnosis Date   Atrial fibrillation (HCC)    Depression    Depression    Hypothyroidism    Insomnia    Left carpal tunnel syndrome    By nerve conduction study   Peripheral neuropathy    S/P small bowel resection    with Diarrhea   Status post radiation therapy    Uterine cancer ([place])    Vitamin D deficiency     Patient Active Problem List   Diagnosis Date Noted   SBO (small bowel obstruction) ([place]) 06/27/2019   Vertigo 03/30/2017   General  weakness 03/30/2017   History of recurrent UTIs 03/30/2017   Hypoglycemia 02/19/2017   UTI (urinary tract infection) 02/18/2017   Generalized weakness 02/18/2017   Hypokalemia 02/18/2017   Hypocalcemia 02/18/2017   Hypomagnesemia 02/18/2017   Chronic diarrhea    Atrial fibrillation (HCC)    Hyponatremia    Osteoporosis    Abnormality of gait 01/11/2012   Disturbance of skin sensation 01/11/2012   Degeneration of lumbar or lumbosacral intervertebral disc 01/11/2012   Lumbosacral root lesions, not elsewhere classified 01/11/2012   Polyneuropathy in other diseases classified elsewhere ([place]) 01/11/2012    Past Surgical History:  Procedure Laterality Date   ABDOMINAL HYSTERECTOMY     CATARACT EXTRACTION Bilateral      OB History   No obstetric history on file.      Home Medications    Prior to Admission medications   Medication Sig Start Date End Date Taking? Authorizing Provider  Calcium Carbonate (CALTRATE 600 PO) Take 600 mg by mouth 3 (three) times daily.    Yes [provider]  Cholecalciferol (VITAMIN D3) 1000 units CAPS Take 1,000 Units by mouth daily.   Yes [provider]  furosemide (LASIX) 20 MG tablet Take 20 mg by mouth daily.   Yes [provider]  gabapentin (NEURONTIN) 100 MG capsule Take 100 mg by mouth 2 (two) times daily as needed (nerve pain).  Yes [provider]  loperamide (IMODIUM) 2 MG capsule Take 1 capsule (2 mg total) by mouth 5 (five) times daily as needed for diarrhea or loose stools. 10/23/13  Yes Shon Baton, MD  LORazepam (ATIVAN) 0.5 MG tablet Take 0.5 mg by mouth daily as needed for anxiety. 01/26/17  Yes [provider]  magnesium oxide (MAG-OX) 400 (241.3 Mg) MG tablet Take 1 tablet (400 mg total) by mouth daily. 04/01/17  Yes Nita Sells, MD  meclizine (ANTIVERT) 12.5 MG tablet Take 12.5 mg by mouth 3 (three) times daily as needed for dizziness.   Yes [provider]  methocarbamol (ROBAXIN) 500 MG tablet Take 500 mg by mouth every 8 (eight) hours as needed for muscle spasms.   Yes [provider]  metoprolol tartrate (LOPRESSOR) 25 MG tablet Take 1 tablet (25 mg total) by mouth 2 (two) times daily. 08/06/15  Yes Cherene Altes, MD  Multiple Vitamins-Minerals (MULTIVITAMIN WITH MINERALS) tablet Take 1 tablet by mouth daily.   Yes [provider]  potassium chloride SA (K-DUR,KLOR-CON) 20 MEQ tablet Take 2 tablets (40 mEq total) by mouth daily. Patient taking differently: Take 20 mEq by mouth 2 (two) times daily.  04/01/17  Yes Nita Sells, MD  raloxifene (EVISTA) 60 MG tablet Take 60 mg by mouth daily.   Yes [provider]  rivaroxaban (XARELTO) 10 MG TABS tablet Take 10 mg by mouth daily.   Yes [provider]  SYNTHROID 50 MCG tablet Take 50 mcg by mouth daily. 07/01/15  Yes [provider]  VITAMIN B1-B12 IJ Inject 1 mL into the skin every 21 ( twenty-one) days.    Yes [provider]  Vitamin D, Ergocalciferol, (DRISDOL) 50000 UNITS CAPS Take 50,000 Units by mouth See admin instructions. Takes on Monday, Wednesday, Friday   Yes [provider]    Family History Family History  Problem Relation Age of Onset   Stroke Mother    Aneurysm Father    Diabetes Brother     Social History Social History   Tobacco Use   Smoking status: Never Smoker   Smokeless tobacco: Never Used  Substance Use Topics   Alcohol use: No   Drug use: No     Allergies   Codeine and Penicillins   Review of Systems Review of Systems  Constitutional: Negative for chills and fever.  HENT: Negative for congestion and rhinorrhea.   Eyes: Negative for redness and visual disturbance.  Respiratory: Positive for cough. Negative for shortness of breath and wheezing.   Cardiovascular: Positive for chest pain. Negative for palpitations.  Gastrointestinal: Positive for abdominal pain,  diarrhea, nausea and vomiting.  Genitourinary: Negative for dysuria and urgency.  Musculoskeletal: Negative for arthralgias and myalgias.  Skin: Negative for pallor and wound.  Neurological: Negative for dizziness and headaches.     Physical Exam Updated Vital Signs BP (!) 158/86 (BP Location: Right Arm)    Pulse 96    Temp 98.1 F (36.7 C) (Oral)    Resp (!) 23    SpO2 99%   Physical Exam Vitals signs and nursing note reviewed.  Constitutional:      General: She is not in acute distress.    Appearance: She is underweight. She is not diaphoretic.  HENT:     Head: Normocephalic and atraumatic.  Eyes:     Pupils: Pupils are equal, round, and reactive to light.  Neck:     Musculoskeletal: Normal range of motion and neck supple.  Cardiovascular:     Rate and Rhythm: Normal rate and regular rhythm.     Heart sounds: No murmur. No friction rub. No gallop.   Pulmonary:     Effort: Pulmonary effort is normal.     Breath sounds: No wheezing or rales.  Abdominal:     General: There is no distension.     Palpations: Abdomen is soft.     Tenderness: There is no abdominal tenderness.  Musculoskeletal:        General: No tenderness.  Skin:    General: Skin is warm and dry.  Neurological:     Mental Status: She is alert and oriented to person, place, and time.  Psychiatric:        Behavior: Behavior normal.      ED Treatments / Results  Labs (all labs ordered are listed, but only abnormal results are displayed) Labs Reviewed  CBC WITH DIFFERENTIAL/PLATELET - Abnormal; Notable for the following components:      Result Value   WBC 11.8 (*)    Neutro Abs 10.1 (*)    All other components within normal limits  COMPREHENSIVE METABOLIC PANEL - Abnormal; Notable for the following components:   CO2 20 (*)    Glucose, Bld 220 (*)    Creatinine, Ser 1.11 (*)    GFR calc non Af Amer 42 (*)    GFR calc Af Amer 49 (*)    All other components within normal limits  I-STAT CREATININE,  ED - Abnormal; Notable for the following components:   Creatinine, Ser 1.10 (*)    All other components within normal limits  POCT I-STAT EG7 - Abnormal; Notable for the following components:   pO2, Ven 141.0 (*)    Acid-base deficit 5.0 (*)    All other components within normal limits  SARS CORONAVIRUS 2 (TAT 6-24 HRS)  LIPASE, BLOOD  URINALYSIS, ROUTINE W REFLEX MICROSCOPIC  TROPONIN I (HIGH SENSITIVITY)  TROPONIN I (HIGH SENSITIVITY)    EKG EKG Interpretation  Date/Time:  Wednesday June 27 2019 00:37:31 EDT Ventricular Rate:  101 PR Interval:    QRS Duration: 128 QT Interval:  381 QTC Calculation: 494 R Axis:   -61 Text Interpretation:  Sinus tachycardia Left bundle branch block No significant change since last tracing Confirmed by Deno Etienne [phone]) on 06/27/2019 12:39:26 AM   Radiology Ct Abdomen Pelvis W Contrast  Result Date: 06/27/2019 CLINICAL DATA:  Right-sided abdominal pain EXAM: CT ABDOMEN AND PELVIS WITH CONTRAST TECHNIQUE: Multidetector CT imaging of the abdomen and pelvis was performed using the standard protocol following bolus administration of intravenous contrast. CONTRAST:  187mL OMNIPAQUE IOHEXOL 300 MG/ML  SOLN COMPARISON:  May 22, 2016 FINDINGS: Lower chest: Basilar areas of scarring and some mild subpleural reticulation with areas of dependent atelectasis. Posterior border of the heart is compressed by the large hiatal hernia. Atherosclerotic calcification of the coronary arteries. No visible pericardial effusion. Hepatobiliary: Diminutive appearance of the left lobe liver relative to the right, nonspecific. No focal hepatic abnormality. Persistent moderate biliary ductal dilatation without visible gallstones. No pericholecystic inflammation. Pancreas: Atrophic appearance of the pancreas. No ductal dilatation or peripancreatic inflammation. Spleen: Normal in size without focal abnormality. Adrenals/Urinary Tract: Simple attenuation 3 cm cyst in the  interpolar right kidney. Additional intermediate attenuation (30-50 HU) cysts in both kidneys are without significant enhancement or washout on excretory delays favoring hyperdense cysts. Additional subcentimeter hypoattenuating foci are too small to fully characterize on CT imaging but statistically likely benign. No hydronephrosis. No  obstructive urolithiasis. Urinary bladder is unremarkable. Stomach/Bowel: There is marked fluid distention of the now large hiatal hernia and stomach as well as the proximal small bowel to the level of a somewhat gradual transition point in the deep pelvis (3/61). More distal small bowel is normal caliber. Hyperdense material is seen dependently within the stomach. Does not appear to diffuse on delayed excretory phase imaging. Favors ingested material. Postsurgical changes at the cecal tip suggest prior appendectomy and possible ileocolic anastomosis. Colon is fluid-filled through much of its extent with mild mucosal hyperemia. Vascular/Lymphatic: Extensive severe atherosclerotic calcification is seen throughout the abdominal vasculature. No suspicious or enlarged lymph nodes in the included lymphatic chains. Reproductive: Uterus is surgically absent. No concerning adnexal lesions. Other: No abdominopelvic free fluid or free gas. No bowel containing hernias. Postsurgical changes of the low anterior abdomen. Musculoskeletal: Dextrocurvature of the thoracolumbar spine with an apex at L2. Mild lateral listhesis of L1 on L2. Compression deformities of L5 and T11 are age indeterminate but new since 2017. IMPRESSION: 1. Marked fluid distention of the now large hiatal hernia and stomach as well as the proximal small bowel to the level of a somewhat gradual transition point in the deep pelvis, concerning for small bowel obstruction. Possibly related to an adhesion given postsurgical changes in the low abdomen. 2. Small amount of hyperdense material in the stomach, favor ingested  material/medication such as bismuth containing products. 3. Colon is fluid-filled through much of its extent with mild mucosal hyperemia, suggestive of colitis. 4. Postsurgical changes at the cecal tip suggesting prior appendectomy and possible ileocolic anastomosis. Correlate with patient surgical history. 5. Compression deformities of L5 and T11 are age indeterminate but new since 2017. Correlate for point tenderness to assess for acuity. 6. Aortic Atherosclerosis (ICD10-I70.0). Electronically Signed   By: Lovena Le M.D.   On: 06/27/2019 02:33   Dg Chest Port 1 View  Result Date: 06/27/2019 CLINICAL DATA:  85 year old female with chest pain. EXAM: PORTABLE CHEST 1 VIEW COMPARISON:  Chest radiographs 02/18/2017 and earlier. FINDINGS: Portable AP semi upright view at 0104 hours. Stable cardiac size and mediastinal contours. Tortuous thoracic aorta. Calcified aortic atherosclerosis. Visualized tracheal air column is within normal limits. Allowing for portable technique the lungs are clear. No pleural effusion or pneumothorax. Paucity of bowel gas in the upper abdomen. No acute osseous abnormality identified. IMPRESSION: No acute cardiopulmonary abnormality. Aortic Atherosclerosis (ICD10-I70.0). Electronically Signed   By: Genevie Ann M.D.   On: 06/27/2019 01:36    Procedures Procedures (including critical care time)  Medications Ordered in ED Medications  lidocaine (XYLOCAINE) 2 % viscous mouth solution 15 mL (has no administration in time range)  ondansetron (ZOFRAN) injection 4 mg (4 mg Intravenous Given 06/27/19 0048)  sodium chloride 0.9 % bolus 1,000 mL (1,000 mLs Intravenous New Bag/Given 06/27/19 0047)  chlorpheniramine-HYDROcodone (TUSSIONEX) 10-8 MG/5ML suspension 5 mL (5 mLs Oral Given 06/27/19 0048)  iohexol (OMNIPAQUE) 300 MG/ML solution 100 mL (100 mLs Intravenous Contrast Given 06/27/19 0158)     Initial Impression / Assessment and Plan / ED Course  I have reviewed the triage vital signs  and the nursing notes.  Pertinent labs & imaging results that were available during my care of the patient were reviewed by me and considered in my medical decision making (see chart for details).        85 yo F with a chief complaint of chest pain cough nausea vomiting and diarrhea.  Going on for about 12 hours now.  No significant abdominal tenderness for me on exam.  Patient was initially made a code STEMI in the field though canceled by the on-call cardiologist.  My review of the EKG looks similar to her prior.  This was discussed again with the cardiology fellow on call.  Will evaluate for other causes of epigastric and abdominal pain.  Labs chest x-ray fluids reassess.  CT scan reviewed by me with a very distended stomach.  This is likely the cause of her pain comes or goes from her epigastrium down to where the stomach ends.  Read by radiology is likely bowel obstruction.  Discussed with Dr. Rosendo Gros general surgery agreed on NG tube and hospitalist admission and he will come down and evaluate the patient.  The patients results and plan were reviewed and discussed.   Any x-rays performed were independently reviewed by myself.   Differential diagnosis were considered with the presenting HPI.  Medications  lidocaine (XYLOCAINE) 2 % viscous mouth solution 15 mL (has no administration in time range)  ondansetron (ZOFRAN) injection 4 mg (4 mg Intravenous Given 06/27/19 0048)  sodium chloride 0.9 % bolus 1,000 mL (1,000 mLs Intravenous New Bag/Given 06/27/19 0047)  chlorpheniramine-HYDROcodone (TUSSIONEX) 10-8 MG/5ML suspension 5 mL (5 mLs Oral Given 06/27/19 0048)  iohexol (OMNIPAQUE) 300 MG/ML solution 100 mL (100 mLs Intravenous Contrast Given 06/27/19 0158)    Vitals:   06/27/19 0101 06/27/19 0115 06/27/19 0130 06/27/19 0150  BP: 121/70 126/73 138/78 (!) 158/86  Pulse: 96 94 94 96  Resp: (!) 21 19 20  (!) 23  Temp: 98.1 F (36.7 C)     TempSrc: Oral     SpO2: 93% 99% 99% 99%     Final diagnoses:  SBO (small bowel obstruction) (HCC)    Admission/ observation were discussed with the admitting physician, patient and/or family and they are comfortable with the plan.    Final Clinical Impressions(s) / ED Diagnoses   Final diagnoses:  SBO (small bowel obstruction) [facility])    ED Discharge Orders    None       Deno Etienne, DO 06/27/19 [phone]

## 2019-06-27 NOTE — ED Notes (Addendum)
Called OR to see if pt was scheduled for NG placement.  They stated pt was not on the schedule, but were expecting Dr Ramirez's list shortly.

## 2019-06-27 NOTE — Plan of Care (Signed)
  Problem: Elimination: Goal: Will not experience complications related to bowel motility Outcome: Not Progressing   Problem: Skin Integrity: Goal: Risk for impaired skin integrity will decrease Outcome: Not Progressing   Problem: Elimination: Goal: Will not experience complications related to urinary retention Outcome: Progressing   Problem: Health Behavior/Discharge Planning: Goal: Ability to manage health-related needs will improve Outcome: Progressing

## 2019-06-27 NOTE — Progress Notes (Signed)
[facility] paged for IV sleep medication patient request

## 2019-06-27 NOTE — Progress Notes (Signed)
PROGRESS NOTE        PATIENT DETAILS Name: Teresa Gonzalez Age: 85 y.o. Sex: female Date of Birth: 12/08/1921 Admit Date: 06/27/2019 Admitting Physician Rise Patience, MD [id number], Jenny Reichmann, MD  Brief Narrative: Patient is a 85 y.o. female with history of atrial fibrillation on anticoagulation, hypothyroidism who presented with abdominal pain-found to have possible SBO.  Subjective: Lying comfortably in bed-apparently has had numerous BMs earlier this morning.  No nausea vomiting overnight.  Assessment/Plan: Small bowel obstruction: No vomiting-has had some BMs overnight-General surgery following-plans are for fluoroscopy guided NG tube placement.  Defer further to general surgery.  PAF: Continue to hold oral anticoagulant-start IV heparin while she she is n.p.o.  Continue rate control with metoprolol.  AKI: Hemodynamically mediated-resolved with supportive care  Hypothyroidism: Continue with IV Synthroid  Moderate mitral regurgitation: Stable for continued outpatient monitoring   Diet: Diet Order            Diet NPO time specified  Diet effective now               DVT Prophylaxis: Full dose anticoagulation with Heparin  Code Status:  DNR  Family Communication: Daughter at bedside  Disposition Plan: Remain inpatient  Antimicrobial agents: Anti-infectives (From admission, onward)   None      Procedures: None  CONSULTS:  general surgery  Time spent: 40- minutes-Greater than 50% of this time was spent in counseling, explanation of diagnosis, planning of further management, and coordination of care.  MEDICATIONS: Scheduled Meds: . diatrizoate meglumine-sodium  90 mL Per NG tube Once  . [START ON 06/28/2019] levothyroxine  25 mcg Intravenous Daily  . metoprolol tartrate  2.5 mg Intravenous Q8H   Continuous Infusions: . sodium chloride 75 mL/hr at 06/27/19 1134  . heparin     PRN Meds:.acetaminophen **OR** acetaminophen,  ondansetron **OR** ondansetron (ZOFRAN) IV   PHYSICAL EXAM: Vital signs: Vitals:   06/27/19 0730 06/27/19 0732 06/27/19 0806 06/27/19 1107  BP: 138/76 138/76 (!) 161/75   Pulse: 91 91 85   Resp: 16 18 18    Temp:  98 F (36.7 C) 97.9 F (36.6 C)   TempSrc:  Oral    SpO2: 96% 96% 100%   Weight:    45.1 kg  Height:    5\' 4"  (1.626 m)   Filed Weights   06/27/19 1107  Weight: 45.1 kg   Body mass index is 17.07 kg/m.   Gen Exam:Alert awake-not in any distress HEENT:atraumatic, normocephalic Chest: B/L clear to auscultation anteriorly CVS:S1S2 regular Abdomen:soft non tender, non distended Extremities:no edema Neurology: Non focal Skin: no rash  I have personally reviewed following labs and imaging studies  LABORATORY DATA: CBC: Recent Labs  Lab 06/27/19 0049 06/27/19 0122 06/27/19 0446  WBC 11.8*  --  13.8*  NEUTROABS 10.1*  --   --   HGB 13.8 15.0 13.5  HCT 43.9 44.0 40.1  MCV 96.5  --  94.6  PLT 397  --  0000000    Basic Metabolic Panel: Recent Labs  Lab 06/27/19 0049 06/27/19 0121 06/27/19 0122 06/27/19 0446  NA 141  --  143 141  K 4.1  --  4.4 3.9  CL 109  --   --  111  CO2 20*  --   --  18*  GLUCOSE 220*  --   --  141*  BUN 19  --   --  20  CREATININE 1.11* 1.10*  --  0.96  CALCIUM 9.8  --   --  9.3    GFR: Estimated Creatinine Clearance: 25 mL/min (by C-G formula based on SCr of 0.96 mg/dL).  Liver Function Tests: Recent Labs  Lab 06/27/19 0049  AST 35  ALT 17  ALKPHOS 70  BILITOT 0.7  PROT 6.9  ALBUMIN 3.8   Recent Labs  Lab 06/27/19 0049  LIPASE 41   No results for input(s): AMMONIA in the last 168 hours.  Coagulation Profile: No results for input(s): INR, PROTIME in the last 168 hours.  Cardiac Enzymes: No results for input(s): CKTOTAL, CKMB, CKMBINDEX, TROPONINI in the last 168 hours.  BNP (last 3 results) No results for input(s): PROBNP in the last 8760 hours.  HbA1C: No results for input(s): HGBA1C in the last 72  hours.  CBG: No results for input(s): GLUCAP in the last 168 hours.  Lipid Profile: No results for input(s): CHOL, HDL, LDLCALC, TRIG, CHOLHDL, LDLDIRECT in the last 72 hours.  Thyroid Function Tests: No results for input(s): TSH, T4TOTAL, FREET4, T3FREE, THYROIDAB in the last 72 hours.  Anemia Panel: No results for input(s): VITAMINB12, FOLATE, FERRITIN, TIBC, IRON, RETICCTPCT in the last 72 hours.  Urine analysis:    Component Value Date/Time   COLORURINE YELLOW 03/30/2017 2051   APPEARANCEUR HAZY (A) 03/30/2017 2051   LABSPEC 1.011 03/30/2017 2051   PHURINE 5.0 03/30/2017 2051   GLUCOSEU NEGATIVE 03/30/2017 2051   HGBUR NEGATIVE 03/30/2017 2051   BILIRUBINUR NEGATIVE 03/30/2017 2051   KETONESUR 5 (A) 03/30/2017 2051   PROTEINUR NEGATIVE 03/30/2017 2051   UROBILINOGEN 0.2 08/01/2015 0125   NITRITE NEGATIVE 03/30/2017 2051   LEUKOCYTESUR TRACE (A) 03/30/2017 2051    Sepsis Labs: Lactic Acid, Venous    Component Value Date/Time   LATICACIDVEN 2.55 (HH) 02/18/2017 1606    MICROBIOLOGY: No results found for this or any previous visit (from the past 240 hour(s)).  RADIOLOGY STUDIES/RESULTS: Ct Abdomen Pelvis W Contrast  Result Date: 06/27/2019 CLINICAL DATA:  Right-sided abdominal pain EXAM: CT ABDOMEN AND PELVIS WITH CONTRAST TECHNIQUE: Multidetector CT imaging of the abdomen and pelvis was performed using the standard protocol following bolus administration of intravenous contrast. CONTRAST:  176mL OMNIPAQUE IOHEXOL 300 MG/ML  SOLN COMPARISON:  May 22, 2016 FINDINGS: Lower chest: Basilar areas of scarring and some mild subpleural reticulation with areas of dependent atelectasis. Posterior border of the heart is compressed by the large hiatal hernia. Atherosclerotic calcification of the coronary arteries. No visible pericardial effusion. Hepatobiliary: Diminutive appearance of the left lobe liver relative to the right, nonspecific. No focal hepatic abnormality. Persistent  moderate biliary ductal dilatation without visible gallstones. No pericholecystic inflammation. Pancreas: Atrophic appearance of the pancreas. No ductal dilatation or peripancreatic inflammation. Spleen: Normal in size without focal abnormality. Adrenals/Urinary Tract: Simple attenuation 3 cm cyst in the interpolar right kidney. Additional intermediate attenuation (30-50 HU) cysts in both kidneys are without significant enhancement or washout on excretory delays favoring hyperdense cysts. Additional subcentimeter hypoattenuating foci are too small to fully characterize on CT imaging but statistically likely benign. No hydronephrosis. No obstructive urolithiasis. Urinary bladder is unremarkable. Stomach/Bowel: There is marked fluid distention of the now large hiatal hernia and stomach as well as the proximal small bowel to the level of a somewhat gradual transition point in the deep pelvis (3/61). More distal small bowel is normal caliber. Hyperdense material is seen dependently within the stomach. Does not appear to diffuse on delayed excretory phase  imaging. Favors ingested material. Postsurgical changes at the cecal tip suggest prior appendectomy and possible ileocolic anastomosis. Colon is fluid-filled through much of its extent with mild mucosal hyperemia. Vascular/Lymphatic: Extensive severe atherosclerotic calcification is seen throughout the abdominal vasculature. No suspicious or enlarged lymph nodes in the included lymphatic chains. Reproductive: Uterus is surgically absent. No concerning adnexal lesions. Other: No abdominopelvic free fluid or free gas. No bowel containing hernias. Postsurgical changes of the low anterior abdomen. Musculoskeletal: Dextrocurvature of the thoracolumbar spine with an apex at L2. Mild lateral listhesis of L1 on L2. Compression deformities of L5 and T11 are age indeterminate but new since 2017. IMPRESSION: 1. Marked fluid distention of the now large hiatal hernia and stomach as  well as the proximal small bowel to the level of a somewhat gradual transition point in the deep pelvis, concerning for small bowel obstruction. Possibly related to an adhesion given postsurgical changes in the low abdomen. 2. Small amount of hyperdense material in the stomach, favor ingested material/medication such as bismuth containing products. 3. Colon is fluid-filled through much of its extent with mild mucosal hyperemia, suggestive of colitis. 4. Postsurgical changes at the cecal tip suggesting prior appendectomy and possible ileocolic anastomosis. Correlate with patient surgical history. 5. Compression deformities of L5 and T11 are age indeterminate but new since 2017. Correlate for point tenderness to assess for acuity. 6. Aortic Atherosclerosis (ICD10-I70.0). Electronically Signed   By: Lovena Le M.D.   On: 06/27/2019 02:33   Dg Chest Port 1 View  Result Date: 06/27/2019 CLINICAL DATA:  85 year old female with chest pain. EXAM: PORTABLE CHEST 1 VIEW COMPARISON:  Chest radiographs 02/18/2017 and earlier. FINDINGS: Portable AP semi upright view at 0104 hours. Stable cardiac size and mediastinal contours. Tortuous thoracic aorta. Calcified aortic atherosclerosis. Visualized tracheal air column is within normal limits. Allowing for portable technique the lungs are clear. No pleural effusion or pneumothorax. Paucity of bowel gas in the upper abdomen. No acute osseous abnormality identified. IMPRESSION: No acute cardiopulmonary abnormality. Aortic Atherosclerosis (ICD10-I70.0). Electronically Signed   By: Genevie Ann M.D.   On: 06/27/2019 01:36     LOS: 0 days   Oren Binet, MD  [facility]  If 7PM-7AM, please contact night-coverage  Please page via [email or web address]  Go to [email or web address] and use [place]'s universal password to access. If you do not have the password, please contact the hospital operator.  Locate the [facility] provider you are looking for under [facility] and page to a  number that you can be directly reached. If you still have difficulty reaching the provider, please page the [facility] (Director on Call) for the Hospitalists listed on amion for assistance.  06/27/2019, 1:29 PM

## 2019-06-27 NOTE — Progress Notes (Signed)
ANTICOAGULATION CONSULT NOTE - Initial Consult  Pharmacy Consult for heparin drip (xarelto on hold) Indication: atrial fibrillation   Patient Measurements: Height: 5\' 4"  (162.6 cm) Weight: 99 lb 6.8 oz (45.1 kg) IBW/kg (Calculated) : 54.7 Heparin Dosing Weight: 45.1  Vital Signs: Temp: 97.9 F (36.6 C) (09/16 0806) Temp Source: Oral (09/16 0732) BP: 161/75 (09/16 0806) Pulse Rate: 85 (09/16 0806)  Labs: Recent Labs    06/27/19 0049 06/27/19 0121 06/27/19 0122 06/27/19 0301 06/27/19 0446  HGB 13.8  --  15.0  --  13.5  HCT 43.9  --  44.0  --  40.1  PLT 397  --   --   --  324  CREATININE 1.11* 1.10*  --   --  0.96  TROPONINIHS 9  --   --  9  --     Estimated Creatinine Clearance: 25 mL/min (by C-G formula based on SCr of 0.96 mg/dL).   Medical History: Past Medical History:  Diagnosis Date  . Atrial fibrillation ([place])   . Depression   . Depression   . Hypothyroidism   . Insomnia   . Left carpal tunnel syndrome    By nerve conduction study  . Peripheral neuropathy   . S/P small bowel resection    with Diarrhea  . SBO (small bowel obstruction) ([place]) 06/2019  . Status post radiation therapy   . Uterine cancer ([place])   . Vitamin D deficiency     Assessment: 85 yo female with afib on xarelto PTA. Patient with possible SBO. Will be holding xarelto and placing on heparin drip in anticipation of a possible surgical procedure. Last dose given was 9/15 in AM. Given it has been >24 hour from last dose, will start heparin drip now. Will need to obtain APTT given effect of residual xarelto on HL until HL and APTT correlate.   Goal of Therapy:  Heparin level 0.3-0.7 units/ml aPTT 66-102 seconds Monitor platelets by anticoagulation protocol: Yes   Plan:  Start heparin drip at 650 units/hr, no bolus APTT/HL in 8 hours Daily APTT/HL and CBC  Samiel Peel A. Levada Dy, PharmD, BCPS, FNKF Clinical Pharmacist [place] Please utilize Amion for appropriate phone number to  reach the unit pharmacist ([place])    06/27/2019,12:37 PM

## 2019-06-27 NOTE — Progress Notes (Signed)
Spoke with radiology regarding gastrografin. It was administered at 1558. ILWS has to be off for only one hour. Pt will have scan later tonight. Pt denies pain or discomfort. No acute distress. Continue to monitor

## 2019-06-27 NOTE — Progress Notes (Signed)
PT Cancellation Note  Patient Details Name: PESSY CHEATHAM MRN: [id number] DOB: Feb 25, 1922   Cancelled Treatment:    Reason Eval/Treat Not Completed: Medical issues which prohibited therapy - PT order updated to "start tomorrow". Will check back in am.  Julien Girt, PT Acute Rehabilitation Services Pager [phone]  Office [phone]    Roxine Caddy D Elonda Husky 06/27/2019, 3:10 PM

## 2019-06-27 NOTE — ED Notes (Signed)
ED TO INPATIENT HANDOFF REPORT  ED Nurse Name and Phone #:   S Name/Age/Gender Teresa Gonzalez 85 y.o. female Room/Bed: 025C/025C  Code Status   Code Status: Full Code  Home/SNF/Other Dc home AO x4   Triage Complete: Triage complete  Chief Complaint Chest; STEMI  Triage Note Pt alerted EMS via life alert. per ems pt found at home complaining of chest pain 9/10. Per EMS protocol STEMI called in field due to elevation in EKG. Pt started c/o pain radiating down to RUQ enroute. EMS administered 324 ASA and 1 nitrro during transport.    Allergies  Level of Care/Admitting Diagnosis ED Disposition    ED Disposition Condition [facility] Area: [place] [[id number]]  Level of Care: Telemetry Medical [104]  Covid Evaluation: Asymptomatic Screening Protocol (No Symptoms)  Diagnosis: SBO (small bowel obstruction) [facility]) [id number]  Admitting Physician: Rise Patience [phone]  Attending Physician: Rise Patience [phone]  Estimated length of stay: past midnight tomorrow  Certification:: I certify this patient will need inpatient services for at least 2 midnights  PT Class (Do Not Modify): Inpatient [101]  PT Acc Code (Do Not Modify): Private [1]       B Medical/Surgery History Past Medical History:  Diagnosis Date  . Atrial fibrillation ([place])   . Depression   . Depression   . Hypothyroidism   . Insomnia   . Left carpal tunnel syndrome    By nerve conduction study  . Peripheral neuropathy   . S/P small bowel resection    with Diarrhea  . Status post radiation therapy   . Uterine cancer ([place])   . Vitamin D deficiency    Past Surgical History:  Procedure Laterality Date  . ABDOMINAL HYSTERECTOMY    . CATARACT EXTRACTION Bilateral      A IV Location/Drains/Wounds Patient Lines/Drains/Airways Status   Active Line/Drains/Airways    Name:   Placement date:   Placement time:   Site:   Days:   Peripheral IV 06/27/19 Right  Antecubital   06/27/19    0047    Antecubital   less than 1   Peripheral IV 06/27/19 Left Forearm   06/27/19    0055    Forearm   less than 1   External Urinary Catheter   04/01/17    0138    -   817          Intake/Output Last 24 hours  Intake/Output Summary (Last 24 hours) at 06/27/2019 0443 Last data filed at 06/27/2019 0438 Gross per 24 hour  Intake 1000 ml  Output -  Net 1000 ml    Labs/Imaging Results for orders placed or performed during the hospital encounter of 06/27/19 (from the past 48 hour(s))  CBC with Differential     Status: Abnormal   Collection Time: 06/27/19 12:49 AM  Result Value Ref Range   WBC 11.8 (H) 4.0 - 10.5 K/uL   RBC 4.55 3.87 - 5.11 MIL/uL   Hemoglobin 13.8 12.0 - 15.0 g/dL   HCT 43.9 36.0 - 46.0 %   MCV 96.5 80.0 - 100.0 fL   MCH 30.3 26.0 - 34.0 pg   MCHC 31.4 30.0 - 36.0 g/dL   RDW 13.7 11.5 - 15.5 %   Platelets 397 150 - 400 K/uL   nRBC 0.0 0.0 - 0.2 %   Neutrophils Relative % 86 %   Neutro Abs 10.1 (H) 1.7 - 7.7 K/uL   Lymphocytes Relative 11 %  Lymphs Abs 1.3 0.7 - 4.0 K/uL   Monocytes Relative 2 %   Monocytes Absolute 0.3 0.1 - 1.0 K/uL   Eosinophils Relative 0 %   Eosinophils Absolute 0.0 0.0 - 0.5 K/uL   Basophils Relative 0 %   Basophils Absolute 0.0 0.0 - 0.1 K/uL   Immature Granulocytes 1 %   Abs Immature Granulocytes 0.07 0.00 - 0.07 K/uL    Comment: Performed at [facility], [place] [address] [place], [place] [address]  Comprehensive metabolic panel     Status: Abnormal   Collection Time: 06/27/19 12:49 AM  Result Value Ref Range   Sodium 141 135 - 145 mmol/L   Potassium 4.1 3.5 - 5.1 mmol/L    Comment: SLIGHT HEMOLYSIS   Chloride 109 98 - 111 mmol/L   CO2 20 (L) 22 - 32 mmol/L   Glucose, Bld 220 (H) 70 - 99 mg/dL   BUN 19 8 - 23 mg/dL   Creatinine, Ser 1.11 (H) 0.44 - 1.00 mg/dL   Calcium 9.8 8.9 - 10.3 mg/dL   Total Protein 6.9 6.5 - 8.1 g/dL   Albumin 3.8 3.5 - 5.0 g/dL   AST 35 15 - 41 U/L   ALT 17 0 - 44  U/L   Alkaline Phosphatase 70 38 - 126 U/L   Total Bilirubin 0.7 0.3 - 1.2 mg/dL   GFR calc non Af Amer 42 (L) >60 mL/min   GFR calc Af Amer 49 (L) >60 mL/min   Anion gap 12 5 - 15    Comment: Performed at [facility] [address], [place], [place] [address]  Lipase, blood     Status: None   Collection Time: 06/27/19 12:49 AM  Result Value Ref Range   Lipase 41 11 - 51 U/L    Comment: Performed at [facility], [place] [address], [place], [place] [address]  Troponin I (High Sensitivity)     Status: None   Collection Time: 06/27/19 12:49 AM  Result Value Ref Range   Troponin I (High Sensitivity) 9 <18 ng/L    Comment: (NOTE) Elevated high sensitivity troponin I (hsTnI) values and significant  changes across serial measurements may suggest ACS but many other  chronic and acute conditions are known to elevate hsTnI results.  Refer to the Links section for chest pain algorithms and additional  guidance. Performed at [facility], [place] [address], [place], [place] [address]   I-Stat Creatinine, ED (not at [facility]     Status: Abnormal   Collection Time: 06/27/19  1:21 AM  Result Value Ref Range   Creatinine, Ser 1.10 (H) 0.44 - 1.00 mg/dL  POCT I-Stat EG7     Status: Abnormal   Collection Time: 06/27/19  1:22 AM  Result Value Ref Range   pH, Ven 7.299 7.250 - 7.430   pCO2, Ven 44.2 44.0 - 60.0 mmHg   pO2, Ven 141.0 (H) 32.0 - 45.0 mmHg   Bicarbonate 21.7 20.0 - 28.0 mmol/L   TCO2 23 22 - 32 mmol/L   O2 Saturation 99.0 %   Acid-base deficit 5.0 (H) 0.0 - 2.0 mmol/L   Sodium 143 135 - 145 mmol/L   Potassium 4.4 3.5 - 5.1 mmol/L   Calcium, Ion 1.35 1.15 - 1.40 mmol/L   HCT 44.0 36.0 - 46.0 %   Hemoglobin 15.0 12.0 - 15.0 g/dL   Patient temperature 37.0 C    Sample type VENOUS   Troponin I (High Sensitivity)  Status: None   Collection Time: 06/27/19  3:01 AM  Result Value Ref Range   Troponin I (High Sensitivity) 9 <18 ng/L    Comment: (NOTE) Elevated high  sensitivity troponin I (hsTnI) values and significant  changes across serial measurements may suggest ACS but many other  chronic and acute conditions are known to elevate hsTnI results.  Refer to the "Links" section for chest pain algorithms and additional  guidance. Performed at [facility], [place] [address], [place], [place] [address]    Ct Abdomen Pelvis W Contrast  Result Date: 06/27/2019 CLINICAL DATA:  Right-sided abdominal pain EXAM: CT ABDOMEN AND PELVIS WITH CONTRAST TECHNIQUE: Multidetector CT imaging of the abdomen and pelvis was performed using the standard protocol following bolus administration of intravenous contrast. CONTRAST:  120mL OMNIPAQUE IOHEXOL 300 MG/ML  SOLN COMPARISON:  May 22, 2016 FINDINGS: Lower chest: Basilar areas of scarring and some mild subpleural reticulation with areas of dependent atelectasis. Posterior border of the heart is compressed by the large hiatal hernia. Atherosclerotic calcification of the coronary arteries. No visible pericardial effusion. Hepatobiliary: Diminutive appearance of the left lobe liver relative to the right, nonspecific. No focal hepatic abnormality. Persistent moderate biliary ductal dilatation without visible gallstones. No pericholecystic inflammation. Pancreas: Atrophic appearance of the pancreas. No ductal dilatation or peripancreatic inflammation. Spleen: Normal in size without focal abnormality. Adrenals/Urinary Tract: Simple attenuation 3 cm cyst in the interpolar right kidney. Additional intermediate attenuation (30-50 HU) cysts in both kidneys are without significant enhancement or washout on excretory delays favoring hyperdense cysts. Additional subcentimeter hypoattenuating foci are too small to fully characterize on CT imaging but statistically likely benign. No hydronephrosis. No obstructive urolithiasis. Urinary bladder is unremarkable. Stomach/Bowel: There is marked fluid distention of the now large hiatal hernia and  stomach as well as the proximal small bowel to the level of a somewhat gradual transition point in the deep pelvis (3/61). More distal small bowel is normal caliber. Hyperdense material is seen dependently within the stomach. Does not appear to diffuse on delayed excretory phase imaging. Favors ingested material. Postsurgical changes at the cecal tip suggest prior appendectomy and possible ileocolic anastomosis. Colon is fluid-filled through much of its extent with mild mucosal hyperemia. Vascular/Lymphatic: Extensive severe atherosclerotic calcification is seen throughout the abdominal vasculature. No suspicious or enlarged lymph nodes in the included lymphatic chains. Reproductive: Uterus is surgically absent. No concerning adnexal lesions. Other: No abdominopelvic free fluid or free gas. No bowel containing hernias. Postsurgical changes of the low anterior abdomen. Musculoskeletal: Dextrocurvature of the thoracolumbar spine with an apex at L2. Mild lateral listhesis of L1 on L2. Compression deformities of L5 and T11 are age indeterminate but new since 2017. IMPRESSION: 1. Marked fluid distention of the now large hiatal hernia and stomach as well as the proximal small bowel to the level of a somewhat gradual transition point in the deep pelvis, concerning for small bowel obstruction. Possibly related to an adhesion given postsurgical changes in the low abdomen. 2. Small amount of hyperdense material in the stomach, favor ingested material/medication such as bismuth containing products. 3. Colon is fluid-filled through much of its extent with mild mucosal hyperemia, suggestive of colitis. 4. Postsurgical changes at the cecal tip suggesting prior appendectomy and possible ileocolic anastomosis. Correlate with patient surgical history. 5. Compression deformities of L5 and T11 are age indeterminate but new since 2017. Correlate for point tenderness to assess for acuity. 6. Aortic Atherosclerosis (ICD10-I70.0).  Electronically Signed   By: Lovena Le  M.D.   On: 06/27/2019 02:33   Dg Chest Port 1 View  Result Date: 06/27/2019 CLINICAL DATA:  85 year old female with chest pain. EXAM: PORTABLE CHEST 1 VIEW COMPARISON:  Chest radiographs 02/18/2017 and earlier. FINDINGS: Portable AP semi upright view at 0104 hours. Stable cardiac size and mediastinal contours. Tortuous thoracic aorta. Calcified aortic atherosclerosis. Visualized tracheal air column is within normal limits. Allowing for portable technique the lungs are clear. No pleural effusion or pneumothorax. Paucity of bowel gas in the upper abdomen. No acute osseous abnormality identified. IMPRESSION: No acute cardiopulmonary abnormality. Aortic Atherosclerosis (ICD10-I70.0). Electronically Signed   By: Genevie Ann M.D.   On: 06/27/2019 01:36    Pending Labs Unresulted Labs (From admission, onward)    Start     Ordered   06/27/19 [id number]  Basic metabolic panel  Tomorrow morning,   R     06/27/19 0419   06/27/19 0500  CBC  Tomorrow morning,   R     06/27/19 0419   06/27/19 0255  SARS CORONAVIRUS 2 (TAT 6-24 HRS) Nasopharyngeal Nasopharyngeal Swab  (Asymptomatic/Tier 2 Patients Labs)  Once,   STAT    Question Answer Comment  Is this test for diagnosis or screening Screening   Symptomatic for COVID-19 as defined by CDC No   Hospitalized for COVID-19 No   Admitted to ICU for COVID-19 No   Previously tested for COVID-19 No   Resident in a congregate (group) care setting Yes   Employed in healthcare setting No   Pregnant No      06/27/19 0254   06/27/19 0038  Urinalysis, Routine w reflex microscopic  (ED Abdominal Pain)  ONCE - STAT,   STAT     06/27/19 0038          Vitals/Pain Today's Vitals   06/27/19 0326 06/27/19 0345 06/27/19 0400 06/27/19 0415  BP: (!) 164/97 (!) 162/84 (!) 145/81 (!) 145/82  Pulse: (!) 113 100    Resp: (!) 22 (!) 26 19 20   Temp:      TempSrc:      SpO2: 96% 94%    PainSc:        Isolation Precautions No active  isolations  Medications Medications  diatrizoate meglumine-sodium (GASTROGRAFIN) 66-10 % solution 90 mL (has no administration in time range)  acetaminophen (TYLENOL) tablet 650 mg (has no administration in time range)    Or  acetaminophen (TYLENOL) suppository 650 mg (has no administration in time range)  ondansetron (ZOFRAN) tablet 4 mg (has no administration in time range)    Or  ondansetron (ZOFRAN) injection 4 mg (has no administration in time range)  levothyroxine (SYNTHROID, LEVOTHROID) injection 25 mcg (has no administration in time range)  ondansetron (ZOFRAN) injection 4 mg (4 mg Intravenous Given 06/27/19 0048)  sodium chloride 0.9 % bolus 1,000 mL (0 mLs Intravenous Stopped 06/27/19 0438)  chlorpheniramine-HYDROcodone (TUSSIONEX) 10-8 MG/5ML suspension 5 mL (5 mLs Oral Given 06/27/19 0048)  iohexol (OMNIPAQUE) 300 MG/ML solution 100 mL (100 mLs Intravenous Contrast Given 06/27/19 0158)  lidocaine (XYLOCAINE) 2 % viscous mouth solution 15 mL (15 mLs Mouth/Throat Given 06/27/19 0335)    Mobility Complete care Low fall risk   Focused Assessments AO x 4, admitted for SBO, unable to get NG tube down, providers notified, pt may get NG tube on OR today, no skin problems, SR on the monitor.   R Recommendations: See Admitting Provider Note  Report given to:   Additional Notes:

## 2019-06-27 NOTE — Progress Notes (Signed)
[place] for heparin drip (xarelto on hold) Indication: atrial fibrillation   Patient Measurements: Height: 5\' 4"  (162.6 cm) Weight: 99 lb 6.8 oz (45.1 kg) IBW/kg (Calculated) : 54.7 Heparin Dosing Weight: 45.1  Vital Signs: Temp: 97.4 F (36.3 C) (09/16 1957) Temp Source: Oral (09/16 1957) BP: 173/86 (09/16 1957) Pulse Rate: 86 (09/16 1957)  Labs: Recent Labs    06/27/19 0049 06/27/19 0121 06/27/19 0122 06/27/19 0301 06/27/19 0446 06/27/19 2142  HGB 13.8  --  15.0  --  13.5  --   HCT 43.9  --  44.0  --  40.1  --   PLT 397  --   --   --  324  --   APTT  --   --   --   --   --  80*  HEPARINUNFRC  --   --   --   --   --  0.76*  CREATININE 1.11* 1.10*  --   --  0.96  --   TROPONINIHS 9  --   --  9  --   --     Estimated Creatinine Clearance: 25 mL/min (by C-G formula based on SCr of 0.96 mg/dL).   Medical History: Past Medical History:  Diagnosis Date  . Atrial fibrillation ([place])   . Depression   . Depression   . Hypothyroidism   . Insomnia   . Left carpal tunnel syndrome    By nerve conduction study  . Peripheral neuropathy   . S/P small bowel resection    with Diarrhea  . SBO (small bowel obstruction) ([place]) 06/2019  . Status post radiation therapy   . Uterine cancer ([place])   . Vitamin D deficiency     Assessment: 85 yo female with afib on xarelto PTA. Patient with possible SBO. Will be holding xarelto and placing on heparin drip in anticipation of a possible surgical procedure. Last dose given was 9/15 in AM. Given it has been >24 hour from last dose, will start heparin drip now.   Initial aPTT 80 seconds, therapeutic.  Anti-Xa uncorrelated at 0.76  Goal of Therapy:  Heparin level 0.3-0.7 units/ml aPTT 66-102 seconds Monitor platelets by anticoagulation protocol: Yes   Plan:  Continue heparin drip at 650 units/hr Confirm with 8 hour aPTT/HL  Bertis Ruddy, PharmD Clinical Pharmacist Please check AMION for  all [place] numbers 06/27/2019 10:59 PM

## 2019-06-27 NOTE — ED Notes (Signed)
Attempt made to place NG tube down x 3 with nos success, Dr Tyrone Nine and Dr. Rosendo Gros the surgeon notified.

## 2019-06-27 NOTE — ED Triage Notes (Signed)
Pt alerted EMS via life alert. per ems pt found at home complaining of chest pain 9/10. Per EMS protocol STEMI called in field due to elevation in EKG. Pt started c/o pain radiating down to RUQ enroute. EMS administered 324 ASA and 1 nitrro during transport.

## 2019-06-27 NOTE — ED Notes (Signed)
Patient transported to CT 

## 2019-06-27 NOTE — ED Notes (Signed)
Pt. is aware the need of urine specimen.

## 2019-06-28 LAB — APTT
aPTT: 109 seconds — ABNORMAL HIGH (ref 24–36)
aPTT: 129 seconds — ABNORMAL HIGH (ref 24–36)

## 2019-06-28 LAB — BASIC METABOLIC PANEL
Anion gap: 13 (ref 5–15)
BUN: 17 mg/dL (ref 8–23)
CO2: 23 mmol/L (ref 22–32)
Calcium: 8.8 mg/dL — ABNORMAL LOW (ref 8.9–10.3)
Chloride: 110 mmol/L (ref 98–111)
Creatinine, Ser: 0.69 mg/dL (ref 0.44–1.00)
GFR calc Af Amer: >60 mL/min (ref >60)
GFR calc non Af Amer: >60 mL/min (ref >60)
Glucose, Bld: 109 mg/dL — ABNORMAL HIGH (ref 70–99)
Potassium: 3.2 mmol/L — ABNORMAL LOW (ref 3.5–5.1)
Sodium: 146 mmol/L — ABNORMAL HIGH (ref 135–145)

## 2019-06-28 LAB — HEPARIN LEVEL (UNFRACTIONATED)
Heparin Unfractionated: 0.9 IU/mL — ABNORMAL HIGH (ref 0.30–0.70)
Heparin Unfractionated: 0.56 IU/mL (ref 0.30–0.70)

## 2019-06-28 LAB — CBC
HCT: 36.4 % (ref 36.0–46.0)
Hemoglobin: 11.7 g/dL — ABNORMAL LOW (ref 12.0–15.0)
MCH: 30.5 pg (ref 26.0–34.0)
MCHC: 32.1 g/dL (ref 30.0–36.0)
MCV: 95 fL (ref 80.0–100.0)
Platelets: 288 10*3/uL (ref 150–400)
RBC: 3.83 MIL/uL — ABNORMAL LOW (ref 3.87–5.11)
RDW: 14.1 % (ref 11.5–15.5)
WBC: 11.9 10*3/uL — ABNORMAL HIGH (ref 4.0–10.5)
nRBC: 0 % (ref 0.0–0.2)

## 2019-06-28 LAB — HEMOGLOBIN A1C
Hgb A1c MFr Bld: 5.1 % (ref 4.8–5.6)
Mean Plasma Glucose: 100 mg/dL

## 2019-06-28 LAB — MAGNESIUM: Magnesium: 1.5 mg/dL — ABNORMAL LOW (ref 1.7–2.4)

## 2019-06-28 MED ORDER — LIP MEDEX EX OINT
TOPICAL_OINTMENT | CUTANEOUS | Status: DC | PRN
  Filled 2019-06-28: qty 7

## 2019-06-28 MED ORDER — SODIUM CHLORIDE 0.45 % IV SOLN
INTRAVENOUS | Status: DC
  Administered 2019-06-28: 10:00:00 via INTRAVENOUS

## 2019-06-28 MED ORDER — HYDRALAZINE HCL 20 MG/ML IJ SOLN
10.0000 mg | Freq: Once | INTRAMUSCULAR | Status: AC
  Administered 2019-06-28: 10 mg via INTRAVENOUS
  Filled 2019-06-28: qty 1

## 2019-06-28 MED ORDER — POTASSIUM CHLORIDE 10 MEQ/100ML IV SOLN
10.0000 meq | INTRAVENOUS | Status: AC
  Administered 2019-06-28 (×2): 10 meq via INTRAVENOUS
  Filled 2019-06-28 (×2): qty 100

## 2019-06-28 MED ORDER — LOPERAMIDE HCL 2 MG PO CAPS
4.0000 mg | ORAL_CAPSULE | Freq: Once | ORAL | Status: AC
  Administered 2019-06-28: 23:00:00 4 mg via ORAL
  Filled 2019-06-28: qty 2

## 2019-06-28 NOTE — Progress Notes (Signed)
[place] for heparin drip (xarelto on hold) Indication: atrial fibrillation   Patient Measurements: Height: 5\' 4"  (162.6 cm) Weight: 99 lb 6.8 oz (45.1 kg) IBW/kg (Calculated) : 54.7 Heparin Dosing Weight: 45.1  Vital Signs: Temp: 97.5 F (36.4 C) (09/17 0803) Temp Source: Oral (09/17 0803) BP: 155/81 (09/17 0803) Pulse Rate: 84 (09/17 0803)  Labs: Recent Labs    06/27/19 0049 06/27/19 0121 06/27/19 0122 06/27/19 0301 06/27/19 0446 06/27/19 2142 06/28/19 0638  HGB 13.8  --  15.0  --  13.5  --  11.7*  HCT 43.9  --  44.0  --  40.1  --  36.4  PLT 397  --   --   --  324  --  288  APTT  --   --   --   --   --  80* 109*  HEPARINUNFRC  --   --   --   --   --  0.76* 0.90*  CREATININE 1.11* 1.10*  --   --  0.96  --  0.69  TROPONINIHS 9  --   --  9  --   --   --     Estimated Creatinine Clearance: 29.9 mL/min (by C-G formula based on SCr of 0.69 mg/dL).   Medical History: Past Medical History:  Diagnosis Date  . Atrial fibrillation ([place])   . Depression   . Depression   . Hypothyroidism   . Insomnia   . Left carpal tunnel syndrome    By nerve conduction study  . Peripheral neuropathy   . S/P small bowel resection    with Diarrhea  . SBO (small bowel obstruction) ([place]) 06/2019  . Status post radiation therapy   . Uterine cancer ([place])   . Vitamin D deficiency     Assessment: 85 yo female with afib on xarelto PTA. Patient with possible SBO. Will be holding xarelto and placing on heparin drip in anticipation of a possible surgical procedure. Last dose given was 9/15 in AM. Given it has been >24 hour from last dose, will start heparin drip now.   aPTT this AM 109 seconds, Supratherapeutic.  Anti-Xa uncorrelated at 0.90. No bleeding noted. Patient with IV line issues per RN. IV team consult to get new line. Will need to restart at lower rate.   Goal of Therapy:  Heparin level 0.3-0.7 units/ml aPTT 66-102 seconds Monitor  platelets by anticoagulation protocol: Yes   Plan:  Decrease heparin drip to 600 units/hr  8 hour aPTT/HL from new restart.   Aida Lemaire A. Levada Dy, PharmD, BCPS, FNKF Clinical Pharmacist [place] Please utilize Amion for appropriate phone number to reach the unit pharmacist ([place])   06/28/2019 10:27 AM

## 2019-06-28 NOTE — Progress Notes (Signed)
ANTICOAGULATION CONSULT NOTE   Pharmacy Consult for Heparin Infusion (Xarelto on Hold) Indication: Atrial Fibrillation   Patient Measurements: Height: 5\' 4"  (162.6 cm) Weight: 99 lb 6.8 oz (45.1 kg) IBW/kg (Calculated) : 54.7 Heparin Dosing Weight: 45.1  Vital Signs: Temp: 98.2 F (36.8 C) (09/17 2059) Temp Source: Oral (09/17 2059) BP: 186/89 (09/17 2059) Pulse Rate: 88 (09/17 2059)  Labs: Recent Labs    06/27/19 0049 06/27/19 0121 06/27/19 0122 06/27/19 0301 06/27/19 0446 06/27/19 2142 06/28/19 0638 06/28/19 2024  HGB 13.8  --  15.0  --  13.5  --  11.7*  --   HCT 43.9  --  44.0  --  40.1  --  36.4  --   PLT 397  --   --   --  324  --  288  --   APTT  --   --   --   --   --  80* 109* 129*  HEPARINUNFRC  --   --   --   --   --  0.76* 0.90* 0.56  CREATININE 1.11* 1.10*  --   --  0.96  --  0.69  --   TROPONINIHS 9  --   --  9  --   --   --   --     Estimated Creatinine Clearance: 29.9 mL/min (by C-G formula based on SCr of 0.69 mg/dL).   Medical History: Past Medical History:  Diagnosis Date  . Atrial fibrillation ([place])   . Depression   . Depression   . Hypothyroidism   . Insomnia   . Left carpal tunnel syndrome    By nerve conduction study  . Peripheral neuropathy   . S/P small bowel resection    with Diarrhea  . SBO (small bowel obstruction) ([place]) 06/2019  . Status post radiation therapy   . Uterine cancer ([place])   . Vitamin D deficiency     Assessment: 85 yo female with afib on Xarelto PTA. Patient with possible SBO. Will be holding Xarelto and placing on heparin infusion in anticipation of a possible surgical procedure. Last dose of Xarelto was 9/15 in AM. Given it has been >24 hour from last dose, will start heparin drip now.   aPTT this AM 109 seconds, supratherapeutic.  Anti-Xa uncorrelated at 0.90. No bleeding noted. Patient with IV line issues per RN. IV team consult to get new line. Heparin infusion restarted at lower rate (600 units/hr).  aPTT  drawn this evening, ~8hrs after heparin infusion was decreased to 600 units/hr, was 129 sec, which is above the goal range of 66-102 sec. Heparin level drawn at same time was 0.56 units/mL (uncorrelated with aPTT). Per RN, no signs/sx of bleeding. However, pt continues to have issues with her IV (frequent beeping when she moves her arm).  Today's H/H 11.7/36.4, platelets 288  Goal of Therapy:  Heparin level 0.3-0.7 units/ml aPTT 66-102 seconds Monitor platelets by anticoagulation protocol: Yes   Plan:  Hold heparin infusion for 30 mins, then restart heparin infusion at 500 units/hr (discussed with RN) Check aPTT, heparin level with AM labs and adjust as needed Monitor CBC daily Monitor for signs/sx of bleeding  Gillermina Hu, PharmD, BCPS, [facility] Clinical Pharmacist 06/28/2019 9:22 PM

## 2019-06-28 NOTE — Progress Notes (Signed)
PROGRESS NOTE        PATIENT DETAILS Name: Teresa Gonzalez Age: 85 y.o. Sex: female Date of Birth: [birth year] Admit Date: 06/27/2019 Admitting Physician Rise Patience, MD [id number], Jenny Reichmann, MD  Brief Narrative: Patient is a 85 y.o. female with history of atrial fibrillation on anticoagulation, hypothyroidism who presented with abdominal pain-found to have possible SBO.  Subjective: Feels much better-had BM overnight.  NG tube now in place.  Assessment/Plan: Small bowel obstruction: Improved-surgery planning on clamping NG tube and beginning clears.  Defer further to general surgery.    PAF: Continue rate control with metoprolol-continue IV heparin.  Once her diet is stable-we will resume oral anticoagulant.    Hypokalemia: Replete and recheck  Hypernatremia: Change IV fluid to half-normal-recheck tomorrow  AKI: Hemodynamically mediated-resolved with supportive care  Hypothyroidism: Continue with IV Synthroid  Moderate mitral regurgitation: Stable for continued outpatient monitoring   Diet: Diet Order            Diet full liquid Room service appropriate? Yes; Fluid consistency: Thin  Diet effective ____        Diet clear liquid Room service appropriate? Yes; Fluid consistency: Thin  Diet effective now               DVT Prophylaxis: Full dose anticoagulation with Heparin  Code Status:  DNR  Family Communication: None at bedside-we will update family over the next few days.  Barrier to discharge: Resolving SBO-NG tube being clamped-clear liquids being started today.  Disposition Plan: Remain inpatient  Antimicrobial agents: Anti-infectives (From admission, onward)   None      Procedures: None  CONSULTS:  general surgery  Time spent: 25- minutes-Greater than 50% of this time was spent in counseling, explanation of diagnosis, planning of further management, and coordination of care.  MEDICATIONS: Scheduled Meds: .  levothyroxine  25 mcg Intravenous Daily  . metoprolol tartrate  2.5 mg Intravenous Q8H   Continuous Infusions: . sodium chloride 50 mL/hr at 06/28/19 0957  . heparin Stopped (06/28/19 1020)   PRN Meds:.acetaminophen **OR** acetaminophen, lip balm, ondansetron **OR** ondansetron (ZOFRAN) IV   PHYSICAL EXAM: Vital signs: Vitals:   06/27/19 1957 06/27/19 2335 06/28/19 0419 06/28/19 0803  BP: (!) 173/86 (!) 149/70 (!) 164/93 (!) 155/81  Pulse: 86 82 98 84  Resp:   17 16  Temp: (!) 97.4 F (36.3 C)   (!) 97.5 F (36.4 C)  TempSrc: Oral   Oral  SpO2: 96%  97% 98%  Weight:      Height:       Filed Weights   06/27/19 1107  Weight: 45.1 kg   Body mass index is 17.07 kg/m.   Gen Exam:Alert awake-not in any distress HEENT:atraumatic, normocephalic Chest: B/L clear to auscultation anteriorly CVS:S1S2 regular Abdomen:soft non tender, non distended Extremities:no edema Neurology: Non focal Skin: no rash  I have personally reviewed following labs and imaging studies  LABORATORY DATA: CBC: Recent Labs  Lab 06/27/19 0049 06/27/19 0122 06/27/19 0446 06/28/19 0638  WBC 11.8*  --  13.8* 11.9*  NEUTROABS 10.1*  --   --   --   HGB 13.8 15.0 13.5 11.7*  HCT 43.9 44.0 40.1 36.4  MCV 96.5  --  94.6 95.0  PLT 397  --  324 [id number]    Basic Metabolic Panel: Recent Labs  Lab 06/27/19 0049 06/27/19 0121  06/27/19 0122 06/27/19 0446 06/28/19 0638  NA 141  --  143 141 146*  K 4.1  --  4.4 3.9 3.2*  CL 109  --   --  111 110  CO2 20*  --   --  18* 23  GLUCOSE 220*  --   --  141* 109*  BUN 19  --   --  20 17  CREATININE 1.11* 1.10*  --  0.96 0.69  CALCIUM 9.8  --   --  9.3 8.8*  MG  --   --   --   --  1.5*    GFR: Estimated Creatinine Clearance: 29.9 mL/min (by C-G formula based on SCr of 0.69 mg/dL).  Liver Function Tests: Recent Labs  Lab 06/27/19 0049  AST 35  ALT 17  ALKPHOS 70  BILITOT 0.7  PROT 6.9  ALBUMIN 3.8   Recent Labs  Lab 06/27/19 0049  LIPASE 41    No results for input(s): AMMONIA in the last 168 hours.  Coagulation Profile: No results for input(s): INR, PROTIME in the last 168 hours.  Cardiac Enzymes: No results for input(s): CKTOTAL, CKMB, CKMBINDEX, TROPONINI in the last 168 hours.  BNP (last 3 results) No results for input(s): PROBNP in the last 8760 hours.  HbA1C: Recent Labs    06/27/19 0446  HGBA1C 5.1    CBG: No results for input(s): GLUCAP in the last 168 hours.  Lipid Profile: No results for input(s): CHOL, HDL, LDLCALC, TRIG, CHOLHDL, LDLDIRECT in the last 72 hours.  Thyroid Function Tests: No results for input(s): TSH, T4TOTAL, FREET4, T3FREE, THYROIDAB in the last 72 hours.  Anemia Panel: No results for input(s): VITAMINB12, FOLATE, FERRITIN, TIBC, IRON, RETICCTPCT in the last 72 hours.  Urine analysis:    Component Value Date/Time   COLORURINE YELLOW 03/30/2017 2051   APPEARANCEUR HAZY (A) 03/30/2017 2051   LABSPEC 1.011 03/30/2017 2051   PHURINE 5.0 03/30/2017 2051   GLUCOSEU NEGATIVE 03/30/2017 2051   HGBUR NEGATIVE 03/30/2017 2051   [place] NEGATIVE 03/30/2017 2051   KETONESUR 5 (A) 03/30/2017 2051   PROTEINUR NEGATIVE 03/30/2017 2051   UROBILINOGEN 0.2 08/01/2015 0125   NITRITE NEGATIVE 03/30/2017 2051   LEUKOCYTESUR TRACE (A) 03/30/2017 2051    Sepsis Labs: Lactic Acid, Venous    Component Value Date/Time   LATICACIDVEN 2.55 (HH) 02/18/2017 1606    MICROBIOLOGY: Recent Results (from the past 240 hour(s))  SARS CORONAVIRUS 2 (TAT 6-24 HRS) Nasopharyngeal Nasopharyngeal Swab     Status: None   Collection Time: 06/27/19  3:40 AM   Specimen: Nasopharyngeal Swab  Result Value Ref Range Status   SARS Coronavirus 2 NEGATIVE NEGATIVE Final    Comment: (NOTE) SARS-CoV-2 target nucleic acids are NOT DETECTED. The SARS-CoV-2 RNA is generally detectable in upper and lower respiratory specimens during the acute phase of infection. Negative results do not preclude SARS-CoV-2  infection, do not rule out co-infections with other pathogens, and should not be used as the sole basis for treatment or other patient management decisions. Negative results must be combined with clinical observations, patient history, and epidemiological information. The expected result is Negative. Fact Sheet for Patients: SugarRoll.be Fact Sheet for Healthcare Providers: https://www.woods-mathews.com/ This test is not yet approved or cleared by the [place] FDA and  has been authorized for detection and/or diagnosis of SARS-CoV-2 by FDA under an Emergency Use Authorization (EUA). This EUA will remain  in effect (meaning this test can be used) for the duration of the COVID-19 declaration  under Section 56 4(b)(1) of the Act, 21 U.S.C. section 360bbb-3(b)(1), unless the authorization is terminated or revoked sooner. Performed at [facility], [place] [address], [place], [place] [address]     RADIOLOGY STUDIES/RESULTS: Dg Abd 1 View  Result Date: 06/27/2019 CLINICAL DATA:  NG tube placement. EXAM: ABDOMEN - 1 VIEW; DG NASO G TUBE PLC W/FL-NO RAD COMPARISON:  10/21/2013 FINDINGS: Exam demonstrates nasogastric tube with tip over the mid to lower left abdomen and side-port over the left mid abdomen likely over the distal stomach. Bowel gas pattern is nonobstructive. Mild degenerate change of the spine. IMPRESSION: No acute findings. Nasogastric tube with tip over the left mid to lower abdomen likely over the distal stomach. Electronically Signed   By: Marin Olp M.D.   On: 06/27/2019 13:37   Ct Abdomen Pelvis W Contrast  Result Date: 06/27/2019 CLINICAL DATA:  Right-sided abdominal pain EXAM: CT ABDOMEN AND PELVIS WITH CONTRAST TECHNIQUE: Multidetector CT imaging of the abdomen and pelvis was performed using the standard protocol following bolus administration of intravenous contrast. CONTRAST:  136mL OMNIPAQUE IOHEXOL 300 MG/ML  SOLN  COMPARISON:  May 22, 2016 FINDINGS: Lower chest: Basilar areas of scarring and some mild subpleural reticulation with areas of dependent atelectasis. Posterior border of the heart is compressed by the large hiatal hernia. Atherosclerotic calcification of the coronary arteries. No visible pericardial effusion. Hepatobiliary: Diminutive appearance of the left lobe liver relative to the right, nonspecific. No focal hepatic abnormality. Persistent moderate biliary ductal dilatation without visible gallstones. No pericholecystic inflammation. Pancreas: Atrophic appearance of the pancreas. No ductal dilatation or peripancreatic inflammation. Spleen: Normal in size without focal abnormality. Adrenals/Urinary Tract: Simple attenuation 3 cm cyst in the interpolar right kidney. Additional intermediate attenuation (30-50 HU) cysts in both kidneys are without significant enhancement or washout on excretory delays favoring hyperdense cysts. Additional subcentimeter hypoattenuating foci are too small to fully characterize on CT imaging but statistically likely benign. No hydronephrosis. No obstructive urolithiasis. Urinary bladder is unremarkable. Stomach/Bowel: There is marked fluid distention of the now large hiatal hernia and stomach as well as the proximal small bowel to the level of a somewhat gradual transition point in the deep pelvis (3/61). More distal small bowel is normal caliber. Hyperdense material is seen dependently within the stomach. Does not appear to diffuse on delayed excretory phase imaging. Favors ingested material. Postsurgical changes at the cecal tip suggest prior appendectomy and possible ileocolic anastomosis. Colon is fluid-filled through much of its extent with mild mucosal hyperemia. Vascular/Lymphatic: Extensive severe atherosclerotic calcification is seen throughout the abdominal vasculature. No suspicious or enlarged lymph nodes in the included lymphatic chains. Reproductive: Uterus is  surgically absent. No concerning adnexal lesions. Other: No abdominopelvic free fluid or free gas. No bowel containing hernias. Postsurgical changes of the low anterior abdomen. Musculoskeletal: Dextrocurvature of the thoracolumbar spine with an apex at L2. Mild lateral listhesis of L1 on L2. Compression deformities of L5 and T11 are age indeterminate but new since 2017. IMPRESSION: 1. Marked fluid distention of the now large hiatal hernia and stomach as well as the proximal small bowel to the level of a somewhat gradual transition point in the deep pelvis, concerning for small bowel obstruction. Possibly related to an adhesion given postsurgical changes in the low abdomen. 2. Small amount of hyperdense material in the stomach, favor ingested material/medication such as bismuth containing products. 3. Colon is fluid-filled through much of its extent with mild mucosal hyperemia, suggestive of colitis. 4. Postsurgical changes at  the cecal tip suggesting prior appendectomy and possible ileocolic anastomosis. Correlate with patient surgical history. 5. Compression deformities of L5 and T11 are age indeterminate but new since 2017. Correlate for point tenderness to assess for acuity. 6. Aortic Atherosclerosis (ICD10-I70.0). Electronically Signed   By: Lovena Le M.D.   On: 06/27/2019 02:33   Dg Chest Port 1 View  Result Date: 06/27/2019 CLINICAL DATA:  85 year old female with chest pain. EXAM: PORTABLE CHEST 1 VIEW COMPARISON:  Chest radiographs 02/18/2017 and earlier. FINDINGS: Portable AP semi upright view at 0104 hours. Stable cardiac size and mediastinal contours. Tortuous thoracic aorta. Calcified aortic atherosclerosis. Visualized tracheal air column is within normal limits. Allowing for portable technique the lungs are clear. No pleural effusion or pneumothorax. Paucity of bowel gas in the upper abdomen. No acute osseous abnormality identified. IMPRESSION: No acute cardiopulmonary abnormality. Aortic  Atherosclerosis (ICD10-I70.0). Electronically Signed   By: Genevie Ann M.D.   On: 06/27/2019 01:36   Dg Abd Portable 1v-small Bowel Obstruction Protocol-initial, 8 Hr Delay  Result Date: 06/28/2019 CLINICAL DATA:  85 year old female with suspected small bowel obstruction on CT Abdomen and Pelvis yesterday. EXAM: PORTABLE ABDOMEN - 1 VIEW COMPARISON:  CT Abdomen and Pelvis 06/27/2019. FINDINGS: Portable AP views at 0004 hours. Enteric tube in place, side hole at the level of the gastric body. The stomach now appears decompressed. There is oral contrast throughout the large intestine to the rectum. Diverticulosis of the left colon. No dilated small bowel loops. Stable lung bases. Aortoiliac calcified atherosclerosis. No acute osseous abnormality identified. IMPRESSION: 1. Resolved small bowel obstruction, oral contrast throughout the large bowel to the rectum. 2. NG tube in place with decompressed stomach. 3. Diverticulosis of the left colon. Electronically Signed   By: Genevie Ann M.D.   On: 06/28/2019 00:27   Dg [place] Bailey G Tube Plc W/fl-no Rad  Result Date: 06/27/2019 CLINICAL DATA:  NG tube placement. EXAM: ABDOMEN - 1 VIEW; DG NASO G TUBE PLC W/FL-NO RAD COMPARISON:  10/21/2013 FINDINGS: Exam demonstrates nasogastric tube with tip over the mid to lower left abdomen and side-port over the left mid abdomen likely over the distal stomach. Bowel gas pattern is nonobstructive. Mild degenerate change of the spine. IMPRESSION: No acute findings. Nasogastric tube with tip over the left mid to lower abdomen likely over the distal stomach. Electronically Signed   By: Marin Olp M.D.   On: 06/27/2019 13:37     LOS: 1 day   Oren Binet, MD  [facility]  If 7PM-7AM, please contact night-coverage  Please page via [email or web address]  Go to [email or web address] and use [facility]'s universal password to access. If you do not have the password, please contact the hospital operator.  Locate the [facility] provider you are looking  for under [facility] and page to a number that you can be directly reached. If you still have difficulty reaching the provider, please page the [facility] (Director on Call) for the Hospitalists listed on amion for assistance.  06/28/2019, 11:19 AM

## 2019-06-28 NOTE — Progress Notes (Signed)
Initial Nutrition Assessment  RD working remotely.  DOCUMENTATION CODES:   Underweight  INTERVENTION:   -MVI with minerals daily -Ensure Enlive po BID, each supplement provides 350 kcal and 20 grams of protein -RD will follow for diet advancement and adjust supplement regimen as appropriate  NUTRITION DIAGNOSIS:   Inadequate oral intake related to altered GI function as evidenced by meal completion < 25%.  GOAL:   Patient will meet greater than or equal to 90% of their needs  MONITOR:   PO intake, Supplement acceptance, Diet advancement, Labs, Weight trends, Skin, I & O's  REASON FOR ASSESSMENT:   Low Braden    ASSESSMENT:   Teresa Gonzalez is a 85 y.o. female with history of atrial fibrillation, hypothyroidism mitral regurgitation presents to the ER with complaint of sudden onset of abdominal pain and nausea vomiting since yesterday afternoon 2 PM.  Abdominal pain mostly in the epigastric area.  Since pain persisted along with nausea vomiting patient present to the ER.  Epigastric pain radiated to her chest presently chest pain-free.  Pt admitted with SBO.   9/16- NGT placed 9/17- NGT clamped and advanced to clears  Reviewed I/O's: +920 ml x 24 hours and +1.9 L since admission  NGT output: 275 ml x 24 hours  Per general surgery notes, plan to advance diet and remove NGT today.   Attempted to speak with pt via phone, however, no answer. Unable to obtain further nutrition-related history at this time.   Reviewed wt hx; noted pt wt has been stable over the past 2 years.   Given pt's underweight status and advanced age, pt is at high risk for malnutrition. Pt would greatly benefit from oral nutrition supplements.   Medications reviewed and include 0.45% sodium chloride infusion @ 50 ml/hr.   Therapies recommending home health at discharge.   Labs reviewed: Na: 146, K: 3.2, Mg: 1.5.   Diet Order:   Diet Order            Diet full liquid Room service appropriate?  Yes; Fluid consistency: Thin  Diet effective ____              EDUCATION NEEDS:   No education needs have been identified at this time  Skin:  Skin Assessment: Skin Integrity Issues: Skin Integrity Issues:: Other (Comment) Other: MASD to buttocks and groin  Last BM:  06/28/19  Height:   Ht Readings from Last 1 Encounters:  06/27/19 5\' 4"  (1.626 m)    Weight:   Wt Readings from Last 1 Encounters:  06/27/19 45.1 kg    Ideal Body Weight:  54.5 kg  BMI:  Body mass index is 17.07 kg/m.  Estimated Nutritional Needs:   Kcal:  1350-1550  Protein:  55-70 grams  Fluid:  > 1.3 L    Khoen Genet A. Jimmye Norman, RD, LDN, [place] Registered Dietitian II Certified Diabetes Care and Education Specialist Pager: [phone] After hours Pager: [phone]

## 2019-06-28 NOTE — Progress Notes (Addendum)
Contacted Chaney Malling NP about patient's BP asking for hydralazine. Also requested Imodium for patient's diarrhea. Will continue to monitor.  @2235  Orders obtained  @2328  Contacted Schorr NP and stated BP is 179/84 after hydralazine was given

## 2019-06-28 NOTE — Plan of Care (Signed)
  Problem: Clinical Measurements: Goal: Diagnostic test results will improve Outcome: Progressing   Problem: Clinical Measurements: Goal: Cardiovascular complication will be avoided Outcome: Progressing   Problem: Activity: Goal: Risk for activity intolerance will decrease Outcome: Progressing   Problem: Coping: Goal: Level of anxiety will decrease Outcome: Progressing   Problem: Elimination: Goal: Will not experience complications related to bowel motility Outcome: Progressing Goal: Will not experience complications related to urinary retention Outcome: Progressing   Problem: Safety: Goal: Ability to remain free from injury will improve Outcome: Progressing   Problem: Skin Integrity: Goal: Risk for impaired skin integrity will decrease Outcome: Progressing

## 2019-06-28 NOTE — Progress Notes (Addendum)
CC: Chest pain/abdominal pain  Subjective: She is sitting up in the chair having bowel movements.  Her NG is on suction but is hooked up through the blue port with the remainder of the NG being clamped.  She is essentially been on a clamping trial.  But that was 9:55 AM.  Her abdomen soft, she is having no discomfort.  Objective: Vital signs in last 24 hours: Temp:  [97.4 F (36.3 C)-97.9 F (36.6 C)] 97.5 F (36.4 C) (09/17 0803) Pulse Rate:  [82-98] 84 (09/17 0803) Resp:  [16-17] 16 (09/17 0803) BP: (149-173)/(70-93) 155/81 (09/17 0803) SpO2:  [96 %-99 %] 98 % (09/17 0803) Weight:  [45.1 kg] 45.1 kg (09/16 1107) Last BM Date: 06/27/19 N.p.o. 90 down the NG 1105 IV Urine not recorded, voided x1 NG to 75 recorded BM x3 Afebrile vital signs are stable diastolic pressure somewhat elevated. Potassium is 3.2, mag is 1.5, creatinine 0.69, sodium 146 WBC 11.9 Hemoglobin 11.7, hematocrit 36.4. Film done at 1150 last evening shows small bowel resolution with oral contrast throughout the bowel to the rectum stomach is decompressed.   Intake/Output from previous day: 09/16 0701 - 09/17 0700 In: 1195.1 [I.V.:1105.1; NG/GT:90] Out: 275 [Emesis/NG output:275] Intake/Output this shift: No intake/output data recorded.  General appearance: alert, cooperative and no distress Resp: clear to auscultation bilaterally GI: soft, non-tender; bowel sounds normal; no masses,  no organomegaly having bowel movements.  Lab Results:  Recent Labs    06/27/19 0446 06/28/19 0638  WBC 13.8* 11.9*  HGB 13.5 11.7*  HCT 40.1 36.4  PLT 324 288    BMET Recent Labs    06/27/19 0446 06/28/19 0638  NA 141 146*  K 3.9 3.2*  CL 111 110  CO2 18* 23  GLUCOSE 141* 109*  BUN 20 17  CREATININE 0.96 0.69  CALCIUM 9.3 8.8*   PT/INR No results for input(s): LABPROT, INR in the last 72 hours.  Recent Labs  Lab 06/27/19 0049  AST 35  ALT 17  ALKPHOS 70  BILITOT 0.7  PROT 6.9  ALBUMIN  3.8     Lipase     Component Value Date/Time   LIPASE 41 06/27/2019 0049     Medications: . levothyroxine  25 mcg Intravenous Daily  . metoprolol tartrate  2.5 mg Intravenous Q8H   . sodium chloride    . heparin 650 Units/hr (06/28/19 [id number])  . potassium chloride      Assessment/Plan Hypothyroidism A Fib - Hold home Xarelto. 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 Colitis on CT  SBO - Hx of Hysterectomy, SBR and radiation for her uterine cancer - Large Hiatal hernia and Stomach on CT. This has been present since 2017. The proximal small bowel is also dilated. Questionable transition point in pelvis.  - NGT to be placed by Fluoro - SBO protocol - Keep Mg >2 and K >4 for bowel function - Mobilize for bowel function. PT ordered.   FEN - NPO VTE - SCDs, Heparin drip per pharmacy  ID - None  Plan: I will officially clamp her NG-tube put her on clears.  Plan to pull her NG later today if she does well, and advance her diet further this afternoon.   LOS: 1 day    JENNINGS,WILLARD 06/28/2019 [id number]  Agree with above. Doing well. Daughter is in room.  She is watching the [place] golf tournament - apparently she played golf at one time.  Alphonsa Overall, MD, [facility] Pager: [phone] Office phone:  [phone]

## 2019-06-28 NOTE — Evaluation (Signed)
Physical Therapy Evaluation Patient Details Name: Teresa Gonzalez MRN: [id number] DOB: 05-14-1922 Today's Date: 06/28/2019   History of Present Illness  85yo female c/o sudden nausea and vomiting and abdominal pain. MI ruled out by cardiology. Concern for SBO on CT. PMH A-fib, hypothyroidism, mitral regurgitation, carpal tunnel L UE, peripheral neuropathy, hx small bowel resection, CA  Clinical Impression   Patient received in bed, pleasant and willing to participate in PT today. Able to complete all functional mobility with close min guard for safety and balance, including bed mobility, functional transfers, and gait training 52f with RW. Gait distance limited by length of NG tube today (tube did not have on/off valve to allow PT to clamp for hallway ambulation). She was left up in the chair with all needs met and chair alarm active, nursing staff educated regarding mobility status. She will continue to benefit from skilled PT services in the acute setting, otherwise recommend skilled HHPT services and 24/7 assist at home.     Follow Up Recommendations Home health PT;Supervision/Assistance - 24 hour    Equipment Recommendations  None recommended by PT    Recommendations for Other Services       Precautions / Restrictions Precautions Precautions: Fall;Other (comment) Precaution Comments: NG tube Restrictions Weight Bearing Restrictions: No      Mobility  Bed Mobility Overal bed mobility: Needs Assistance Bed Mobility: Supine to Sit     Supine to sit: Min guard     General bed mobility comments: min guard for safety, line management  Transfers Overall transfer level: Needs assistance Equipment used: Rolling walker (2 wheeled) Transfers: Sit to/from [facility]nicare[facility] to Stand: Min guard Stand pivot transfers: Min guard       General transfer comment: Min guard with RW for steadying and balance, VC for sequencing and safety  Ambulation/Gait Ambulation/Gait  assistance: Min guard Gait Distance (Feet): 15 Feet Assistive device: Rolling walker (2 wheeled) Gait Pattern/deviations: Step-through pattern;Decreased step length - left;Decreased step length - right;Decreased stride length;Trunk flexed Gait velocity: decreased   General Gait Details: gait distance limited by NG tube length (no on/off valve to allow PT to disconnect for further gait distance); min guard for balance and safety, line management  Stairs            Wheelchair Mobility    Modified Rankin (Stroke Patients Only)       Balance Overall balance assessment: Needs assistance;History of Falls Sitting-balance support: Bilateral upper extremity supported;Feet supported Sitting balance-Leahy Scale: Good     Standing balance support: Bilateral upper extremity supported;During functional activity Standing balance-Leahy Scale: Fair Standing balance comment: min guard for balance, generalized unsteadiness                             Pertinent Vitals/Pain      Home Living Family/patient expects to be discharged to:: Private residence Living Arrangements: Alone Available Help at Discharge: Personal care attendant;Available 24 hours/day Type of Home: House Home Access: Level entry     Home Layout: One level Home Equipment: Walker - 4 wheels;Transport chair;Hand held shower head;Grab bars - toilet;Shower seat - built in;Grab bars - tub/shower;Cane - single point;Wheelchair - manual      Prior Function Level of Independence: Needs assistance   Gait / Transfers Assistance Needed: uses rollator at home, patient states with Mod(I) but per history she has needed min-modA from family, [place]  ADL's / Homemaking Assistance Needed: assist needed for  bathing/dressing        Hand Dominance        Extremity/Trunk Assessment   Upper Extremity Assessment Upper Extremity Assessment: Generalized weakness    Lower Extremity Assessment Lower Extremity  Assessment: Generalized weakness    Cervical / Trunk Assessment Cervical / Trunk Assessment: Kyphotic  Communication   Communication: HOH  Cognition Arousal/Alertness: Awake/alert Behavior During Therapy: WFL for tasks assessed/performed Overall Cognitive Status: Within Functional Limits for tasks assessed                                        General Comments      Exercises     Assessment/Plan    PT Assessment Patient needs continued PT services  PT Problem List Decreased strength;Decreased mobility;Decreased safety awareness;Decreased coordination;Decreased activity tolerance;Decreased balance       PT Treatment Interventions DME instruction;Therapeutic activities;Gait training;Therapeutic exercise;Patient/family education;Stair training;Balance training;Functional mobility training;Neuromuscular re-education    PT Goals (Current goals can be found in the Care Plan section)  Acute Rehab PT Goals Patient Stated Goal: go home, get back to routine PT Goal Formulation: With patient Time For Goal Achievement: 07/12/19 Potential to Achieve Goals: Good    Frequency Min 3X/week   Barriers to discharge        Co-evaluation               AM-PAC PT "6 Clicks" Mobility  Outcome Measure Help needed turning from your back to your side while in a flat bed without using bedrails?: None Help needed moving from lying on your back to sitting on the side of a flat bed without using bedrails?: A Little Help needed moving to and from a bed to a chair (including a wheelchair)?: A Little Help needed standing up from a chair using your arms (e.g., wheelchair or bedside chair)?: A Little Help needed to walk in hospital room?: A Little Help needed climbing 3-5 steps with a railing? : A Lot 6 Click Score: 18    End of Session   Activity Tolerance: Patient tolerated treatment well Patient left: in chair;with call bell/phone within reach;with chair alarm set Nurse  Communication: Mobility status PT Visit Diagnosis: Unsteadiness on feet (R26.81);History of falling (Z91.81);Muscle weakness (generalized) (M62.81);Difficulty in walking, not elsewhere classified (R26.2)    Time: 4917-9150 PT Time Calculation (min) (ACUTE ONLY): 47 min   Charges:   PT Evaluation $PT Eval Low Complexity: 1 Low PT Treatments $Gait Training: 8-22 mins $Therapeutic Activity: 8-22 mins        Deniece Ree PT, DPT, CBIS  Supplemental Physical Therapist [place]    Pager [phone] Acute Rehab Office [phone]

## 2019-06-29 LAB — BASIC METABOLIC PANEL
Anion gap: 12 (ref 5–15)
BUN: 11 mg/dL (ref 8–23)
CO2: 20 mmol/L — ABNORMAL LOW (ref 22–32)
Calcium: 8.6 mg/dL — ABNORMAL LOW (ref 8.9–10.3)
Chloride: 106 mmol/L (ref 98–111)
Creatinine, Ser: 0.6 mg/dL (ref 0.44–1.00)
GFR calc Af Amer: >60 mL/min (ref >60)
GFR calc non Af Amer: >60 mL/min (ref >60)
Glucose, Bld: 95 mg/dL (ref 70–99)
Potassium: 3.2 mmol/L — ABNORMAL LOW (ref 3.5–5.1)
Sodium: 138 mmol/L (ref 135–145)

## 2019-06-29 LAB — HEPARIN LEVEL (UNFRACTIONATED): Heparin Unfractionated: 0.29 IU/mL — ABNORMAL LOW (ref 0.30–0.70)

## 2019-06-29 LAB — APTT: aPTT: 77 seconds — ABNORMAL HIGH (ref 24–36)

## 2019-06-29 LAB — MAGNESIUM: Magnesium: 1.4 mg/dL — ABNORMAL LOW (ref 1.7–2.4)

## 2019-06-29 MED ORDER — NON FORMULARY: 6.0000 mg | Freq: Every evening | Status: DC | PRN

## 2019-06-29 MED ORDER — RIVAROXABAN 10 MG PO TABS
10.0000 mg | ORAL_TABLET | Freq: Every day | ORAL | Status: DC
  Administered 2019-06-29: 10 mg via ORAL
  Filled 2019-06-29: qty 1

## 2019-06-29 MED ORDER — MELATONIN 3 MG PO TABS
6.0000 mg | ORAL_TABLET | Freq: Every evening | ORAL | Status: DC | PRN
  Administered 2019-06-29: 6 mg via ORAL
  Filled 2019-06-29 (×2): qty 2

## 2019-06-29 MED ORDER — POTASSIUM CHLORIDE CRYS ER 20 MEQ PO TBCR
40.0000 meq | EXTENDED_RELEASE_TABLET | Freq: Once | ORAL | Status: AC
  Administered 2019-06-29: 40 meq via ORAL
  Filled 2019-06-29: qty 2

## 2019-06-29 NOTE — Progress Notes (Signed)
   06/29/19 1205  AVS Discharge Documentation  AVS Discharge Instructions Including Medications Provided to patient/caregiver  Name of Person Receiving AVS Discharge Instructions Including Medications Vikie  Name of Clinician That Reviewed AVS Discharge Instructions Including Medications Dineen Kid RN

## 2019-06-29 NOTE — Progress Notes (Addendum)
    CC: Abdominal pain  Subjective: Patient reports she is eating soft diet tolerating it well.  Having bowel movements.  She also reports that she takes on average 12 Imodium per day at home.  This is been going on for some time.  Objective: Vital signs in last 24 hours: Temp:  [97.7 F (36.5 C)-98.2 F (36.8 C)] 97.7 F (36.5 C) (09/18 0700) Pulse Rate:  [86-88] 88 (09/18 0503) Resp:  [16-20] 20 (09/17 2059) BP: (143-186)/(69-90) 163/76 (09/18 0700) SpO2:  [96 %-99 %] 97 % (09/18 0700) Weight:  [45.7 kg] 45.7 kg (09/18 0443) Last BM Date: 06/28/19 720 p.o. 652 IV 500 urine BM x4 Afebrile vital signs are stable blood pressures elevated. No labs today  Intake/Output from previous day: 09/17 0701 - 09/18 0700 In: 1372.6 [P.O.:720; I.V.:652.6] Out: 500 [Urine:500] Intake/Output this shift: No intake/output data recorded.  General appearance: alert, cooperative and no distress GI: soft, non-tender; bowel sounds normal; no masses,  no organomegaly  Lab Results:  Recent Labs    06/27/19 0446 06/28/19 0638  WBC 13.8* 11.9*  HGB 13.5 11.7*  HCT 40.1 36.4  PLT 324 288    BMET Recent Labs    06/28/19 0638 06/29/19 0528  NA 146* 138  K 3.2* 3.2*  CL 110 106  CO2 23 20*  GLUCOSE 109* 95  BUN 17 11  CREATININE 0.69 0.60  CALCIUM 8.8* 8.6*   PT/INR No results for input(s): LABPROT, INR in the last 72 hours.  Recent Labs  Lab 06/27/19 0049  AST 35  ALT 17  ALKPHOS 70  BILITOT 0.7  PROT 6.9  ALBUMIN 3.8     Lipase     Component Value Date/Time   LIPASE 41 06/27/2019 0049     Medications: . levothyroxine  25 mcg Intravenous Daily  . metoprolol tartrate  2.5 mg Intravenous Q8H  . potassium chloride  40 mEq Oral Once  . rivaroxaban  10 mg Oral Daily    Assessment/Plan Hypothyroidism A Fib - Hold home Xarelto. 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  Colitis on CT   SBO - Hx of Hysterectomy, SBR and radiation for her uterine cancer - Large Hiatal hernia and Stomach on CT. This has been present since 2017. The proximal small bowel is also dilated. Questionable transition point in pelvis.  - NGT to be placed by Flu[facility] - SBO protocol - Keep Mg >2 and K >4 for bowel function - Mobilize for bowel function. PT ordered.  FEN -NPO VTE -SCDs, Heparin drip per pharmacy ID -None  Plan: Patient's ready for discharge from our standpoint.  Will defer to Dr Sloan Leiter.  She might benefit from GI evaluation.  With history of radiation enteritis/short gut syndrome history.  Please call if we can be of further assistance.    LOS: 2 days    JENNINGS,WILLARD 06/29/2019 [phone]  Agree with above. Looks good.  Daughter in room.  Ready to go home and watch the golf at home.  Alphonsa Overall, MD, [facility] Surgery Pager: [phone] Office phone:  [phone]

## 2019-06-29 NOTE — Progress Notes (Signed)
ANTICOAGULATION CONSULT NOTE - Follow Up Consult  Pharmacy Consult for heparin Indication: atrial fibrillation  Labs: Recent Labs    06/27/19 0049 06/27/19 0121 06/27/19 0122 06/27/19 0301 06/27/19 0446  06/27/19 2142 06/28/19 0638 06/28/19 2024 06/29/19 0528  HGB 13.8  --  15.0  --  13.5  --   --  11.7*  --   --   HCT 43.9  --  44.0  --  40.1  --   --  36.4  --   --   PLT 397  --   --   --  324  --   --  288  --   --   APTT  --   --   --   --   --   --  80* 109* 129*  --   HEPARINUNFRC  --   --   --   --   --    < > 0.76* 0.90* 0.56 0.29*  CREATININE 1.11* 1.10*  --   --  0.96  --   --  0.69  --   --   TROPONINIHS 9  --   --  9  --   --   --   --   --   --    < > = values in this interval not displayed.    Assessment: 85yo female now subtherapeutic on heparin after rate change; no gtt issues or signs of bleeding per RN.  Goal of Therapy:  Heparin level 0.3-0.7 units/ml   Plan:  Will increase heparin gtt by 1 unit/kg/hr to 550 units/hr and check level in 8 hours.    Wynona Neat, PharmD, BCPS  06/29/2019,6:32 AM

## 2019-06-29 NOTE — TOC Transition Note (Signed)
Transition of Care [facility]) - CM/SW Discharge Note   Patient Details  Name: Teresa Gonzalez MRN: [id number] Date of Birth: Dec 24, 1921  Transition of [facility] CM/SW Contact:  Gelene Mink, [place] Phone Number: 06/29/2019, 12:15 PM   Clinical Narrative:     CSW called and spoke with the patient's daughter, Caryl Pina. The patient does live at home with her spouse and private duty care nurses. The patient has two daughters that are able to provide supervision and assistance. CSW introduced herself, explained role, and shared the therapy recommendation. The patient has used [facility] in the past but does not have a preference of home health agencies. The patient does have a PCP and an upcoming appointment. The patient has a walker, gait belt, and shower chair at home. The daughter requested a 3-in-1. Private duty nurses provide meals, assist with medications, and assist with personal grooming. Caryl Pina stated that they only needed home health PT. She stated that she can follow up with her mother's PCP if they needed anything else.   CSW called and spoke with Georgina Snell with [facility]. They are able to provide home health PT. CSW called and spoke with Zack at [facility]. They will bring the 3-in-1 to the room.   The patient is able to discharge once the DME has been delivered. The patient's daughter will provide transportation home.   Final next level of care: [place] Barriers to Discharge: No Barriers Identified   Patient Goals and CMS Choice Patient states their goals for this hospitalization and ongoing recovery are:: To go home CMS Medicare.gov Compare Post Acute Care list provided to:: Patient Represenative (must comment) Choice offered to / list presented to : Adult Children  Discharge Placement                       Discharge Plan and Services In-house Referral: Clinical Social Work Discharge Planning Services: NA Post Acute Care Choice: Home Health          DME Arranged:  3-N-1 DME Agency: [facility] Date DME Agency Contacted: 06/29/19 Time DME Agency Contacted: [id number] Representative spoke with at DME Agency: [facility] HH Arranged: PT   Date [place]: 06/29/19 Time [place]: [id number]    Social Determinants of Health (SDOH) Interventions     Readmission Risk Interventions No flowsheet data found.

## 2019-06-29 NOTE — Discharge Instructions (Addendum)

## 2019-06-29 NOTE — Discharge Summary (Signed)
PATIENT DETAILS Name: Teresa Gonzalez Age: 85 y.o. Sex: female Date of Birth: 09/18/1922 MRN: [id number]. Admitting Physician: Rise Patience, MD [id number], Jenny Reichmann, MD  Admit Date: 06/27/2019 Discharge date: 06/29/2019  Recommendations for Outpatient Follow-up:  1. Follow up with PCP in 1-2 weeks 2. Please obtain BMP/CBC in one week  Admitted From:  Home  Disposition: Home with home health [place]:  Yes  Equipment/Devices: None  Discharge Condition: Stable  CODE STATUS: FULL CODE  Diet recommendation:  Diet Order            Diet - low sodium heart healthy        DIET SOFT Room service appropriate? Yes; Fluid consistency: Thin  Diet effective now               Brief Summary: See H&P, Labs, Consult and Test reports for all details in brief, Patient is a 85 y.o. female with history of atrial fibrillation on anticoagulation, hypothyroidism who presented with abdominal pain-found to have SBO.  Brief Hospital Course: Small bowel obstruction:  Resolved-managed with just supportive care.  No longer with NG tube.  Tolerating advancement in diet.  Having bowel movements-no abdominal pain or vomiting.  Stable to discharge home later today.  Followed by general surgery during this hospital stay.   PAF: Continue rate control with metoprolol-briefly on IV heparin as patient was n.p.o.-will be switched back to Xarelto on discharge.    Hypokalemia: Replete prior to discharge-recheck at PCPs office.  Hypernatremia: Resolved with half-normal saline.  AKI: Hemodynamically mediated-resolved with supportive care  Hypothyroidism: Continue Synthroid on discharge  Moderate mitral regurgitation: Stable for continued outpatient monitoring  Procedures/Studies: None  Discharge Diagnoses:  Principal Problem:   SBO (small bowel obstruction) (HCC) Active Problems:   Atrial fibrillation (HCC)   Hypothyroidism   Hyperglycemia   Discharge  Instructions:  Activity:  As tolerated   Discharge Instructions    Call MD for:  persistant nausea and vomiting   Complete by: As directed    Call MD for:  severe uncontrolled pain   Complete by: As directed    Diet - low sodium heart healthy   Complete by: As directed    Discharge instructions   Complete by: As directed    Follow with Primary MD  Shon Baton, MD in 1-2 weeks  Please get a complete blood count and chemistry panel checked by your Primary MD at your next visit, and again as instructed by your Primary MD.  Get Medicines reviewed and adjusted: Please take all your medications with you for your next visit with your Primary MD  Laboratory/radiological data: Please request your Primary MD to go over all hospital tests and procedure/radiological results at the follow up, please ask your Primary MD to get all Hospital records sent to his/her office.  In some cases, they will be blood work, cultures and biopsy results pending at the time of your discharge. Please request that your primary care M.D. follows up on these results.  Also Note the following: If you experience worsening of your admission symptoms, develop shortness of breath, life threatening emergency, suicidal or homicidal thoughts you must seek medical attention immediately by calling 911 or calling your MD immediately  if symptoms less severe.  You must read complete instructions/literature along with all the possible adverse reactions/side effects for all the Medicines you take and that have been prescribed to you. Take any new Medicines after you have completely understood and accpet all the  possible adverse reactions/side effects.   Do not drive when taking Pain medications or sleeping medications (Benzodaizepines)  Do not take more than prescribed Pain, Sleep and Anxiety Medications. It is not advisable to combine anxiety,sleep and pain medications without talking with your primary care  practitioner  Special Instructions: If you have smoked or chewed Tobacco  in the last 2 yrs please stop smoking, stop any regular Alcohol  and or any Recreational drug use.  Wear Seat belts while driving.  Please note: You were cared for by a hospitalist during your hospital stay. Once you are discharged, your primary care physician will handle any further medical issues. Please note that NO REFILLS for any discharge medications will be authorized once you are discharged, as it is imperative that you return to your primary care physician (or establish a relationship with a primary care physician if you do not have one) for your post hospital discharge needs so that they can reassess your need for medications and monitor your lab values.   Increase activity slowly   Complete by: As directed      Allergies as of 06/29/2019      Reactions   Codeine Itching   Penicillins Swelling         Medication List    TAKE these medications   CALTRATE 600 PO Take 600 mg by mouth 3 (three) times daily.   furosemide 20 MG tablet Commonly known as: LASIX Take 20 mg by mouth daily.   gabapentin 100 MG capsule Commonly known as: NEURONTIN Take 100 mg by mouth 2 (two) times daily as needed (nerve pain).   loperamide 2 MG capsule Commonly known as: IMODIUM Take 1 capsule (2 mg total) by mouth 5 (five) times daily as needed for diarrhea or loose stools.   LORazepam 0.5 MG tablet Commonly known as: ATIVAN Take 0.5 mg by mouth daily as needed for anxiety.   magnesium oxide 400 (241.3 Mg) MG tablet Commonly known as: MAG-OX Take 1 tablet (400 mg total) by mouth daily.   meclizine 12.5 MG tablet Commonly known as: ANTIVERT Take 12.5 mg by mouth 3 (three) times daily as needed for dizziness.   methocarbamol 500 MG tablet Commonly known as: ROBAXIN Take 500 mg by mouth every 8 (eight) hours as needed for muscle spasms.   metoprolol tartrate 25 MG tablet Commonly known as: LOPRESSOR Take 1  tablet (25 mg total) by mouth 2 (two) times daily.   multivitamin with minerals tablet Take 1 tablet by mouth daily.   potassium chloride SA 20 MEQ tablet Commonly known as: K-DUR Take 2 tablets (40 mEq total) by mouth daily. What changed:   how much to take  when to take this   raloxifene 60 MG tablet Commonly known as: EVISTA Take 60 mg by mouth daily.   rivaroxaban 10 MG Tabs tablet Commonly known as: XARELTO Take 10 mg by mouth daily.   Synthroid 50 MCG tablet Generic drug: levothyroxine Take 50 mcg by mouth daily.   VITAMIN B1-B12 IJ Inject 1 mL into the skin every 21 ( twenty-one) days.   Vitamin D (Ergocalciferol) 1.25 MG (50000 UT) Caps capsule Commonly known as: DRISDOL Take 50,000 Units by mouth See admin instructions. Takes on Monday, Wednesday, Friday   Vitamin D3 25 MCG (1000 UT) Caps Take 1,000 Units by mouth daily.      Follow-up Information    Shon Baton, MD. Schedule an appointment as soon as possible for a visit in 1 week(s).  Specialty: Internal Medicine Contact information: [place] [address] [phone]             Allergies  Allergen Reactions   Codeine Itching   Penicillins Swelling        Consultations:   general surgery   Other Procedures/Studies: Dg Abd 1 View  Result Date: 06/27/2019 CLINICAL DATA:  NG tube placement. EXAM: ABDOMEN - 1 VIEW; DG NASO G TUBE PLC W/FL-NO RAD COMPARISON:  10/21/2013 FINDINGS: Exam demonstrates nasogastric tube with tip over the mid to lower left abdomen and side-port over the left mid abdomen likely over the distal stomach. Bowel gas pattern is nonobstructive. Mild degenerate change of the spine. IMPRESSION: No acute findings. Nasogastric tube with tip over the left mid to lower abdomen likely over the distal stomach. Electronically Signed   By: Marin Olp M.D.   On: 06/27/2019 13:37   Ct Abdomen Pelvis W Contrast  Result Date: 06/27/2019 CLINICAL DATA:   Right-sided abdominal pain EXAM: CT ABDOMEN AND PELVIS WITH CONTRAST TECHNIQUE: Multidetector CT imaging of the abdomen and pelvis was performed using the standard protocol following bolus administration of intravenous contrast. CONTRAST:  19mL OMNIPAQUE IOHEXOL 300 MG/ML  SOLN COMPARISON:  May 22, 2016 FINDINGS: Lower chest: Basilar areas of scarring and some mild subpleural reticulation with areas of dependent atelectasis. Posterior border of the heart is compressed by the large hiatal hernia. Atherosclerotic calcification of the coronary arteries. No visible pericardial effusion. Hepatobiliary: Diminutive appearance of the left lobe liver relative to the right, nonspecific. No focal hepatic abnormality. Persistent moderate biliary ductal dilatation without visible gallstones. No pericholecystic inflammation. Pancreas: Atrophic appearance of the pancreas. No ductal dilatation or peripancreatic inflammation. Spleen: Normal in size without focal abnormality. Adrenals/Urinary Tract: Simple attenuation 3 cm cyst in the interpolar right kidney. Additional intermediate attenuation (30-50 HU) cysts in both kidneys are without significant enhancement or washout on excretory delays favoring hyperdense cysts. Additional subcentimeter hypoattenuating foci are too small to fully characterize on CT imaging but statistically likely benign. No hydronephrosis. No obstructive urolithiasis. Urinary bladder is unremarkable. Stomach/Bowel: There is marked fluid distention of the now large hiatal hernia and stomach as well as the proximal small bowel to the level of a somewhat gradual transition point in the deep pelvis (3/61). More distal small bowel is normal caliber. Hyperdense material is seen dependently within the stomach. Does not appear to diffuse on delayed excretory phase imaging. Favors ingested material. Postsurgical changes at the cecal tip suggest prior appendectomy and possible ileocolic anastomosis. Colon is  fluid-filled through much of its extent with mild mucosal hyperemia. Vascular/Lymphatic: Extensive severe atherosclerotic calcification is seen throughout the abdominal vasculature. No suspicious or enlarged lymph nodes in the included lymphatic chains. Reproductive: Uterus is surgically absent. No concerning adnexal lesions. Other: No abdominopelvic free fluid or free gas. No bowel containing hernias. Postsurgical changes of the low anterior abdomen. Musculoskeletal: Dextrocurvature of the thoracolumbar spine with an apex at L2. Mild lateral listhesis of L1 on L2. Compression deformities of L5 and T11 are age indeterminate but new since 2017. IMPRESSION: 1. Marked fluid distention of the now large hiatal hernia and stomach as well as the proximal small bowel to the level of a somewhat gradual transition point in the deep pelvis, concerning for small bowel obstruction. Possibly related to an adhesion given postsurgical changes in the low abdomen. 2. Small amount of hyperdense material in the stomach, favor ingested material/medication such as bismuth containing products. 3. Colon is fluid-filled through much  of its extent with mild mucosal hyperemia, suggestive of colitis. 4. Postsurgical changes at the cecal tip suggesting prior appendectomy and possible ileocolic anastomosis. Correlate with patient surgical history. 5. Compression deformities of L5 and T11 are age indeterminate but new since 2017. Correlate for point tenderness to assess for acuity. 6. Aortic Atherosclerosis (ICD10-I70.0). Electronically Signed   By: Lovena Le M.D.   On: 06/27/2019 02:33   Dg Chest Port 1 View  Result Date: 06/27/2019 CLINICAL DATA:  85 year old female with chest pain. EXAM: PORTABLE CHEST 1 VIEW COMPARISON:  Chest radiographs 02/18/2017 and earlier. FINDINGS: Portable AP semi upright view at 0104 hours. Stable cardiac size and mediastinal contours. Tortuous thoracic aorta. Calcified aortic atherosclerosis. Visualized  tracheal air column is within normal limits. Allowing for portable technique the lungs are clear. No pleural effusion or pneumothorax. Paucity of bowel gas in the upper abdomen. No acute osseous abnormality identified. IMPRESSION: No acute cardiopulmonary abnormality. Aortic Atherosclerosis (ICD10-I70.0). Electronically Signed   By: Genevie Ann M.D.   On: 06/27/2019 01:36   Dg Abd Portable 1v-small Bowel Obstruction Protocol-initial, 8 Hr Delay  Result Date: 06/28/2019 CLINICAL DATA:  85 year old female with suspected small bowel obstruction on CT Abdomen and Pelvis yesterday. EXAM: PORTABLE ABDOMEN - 1 VIEW COMPARISON:  CT Abdomen and Pelvis 06/27/2019. FINDINGS: Portable AP views at 0004 hours. Enteric tube in place, side hole at the level of the gastric body. The stomach now appears decompressed. There is oral contrast throughout the large intestine to the rectum. Diverticulosis of the left colon. No dilated small bowel loops. Stable lung bases. Aortoiliac calcified atherosclerosis. No acute osseous abnormality identified. IMPRESSION: 1. Resolved small bowel obstruction, oral contrast throughout the large bowel to the rectum. 2. NG tube in place with decompressed stomach. 3. Diverticulosis of the left colon. Electronically Signed   By: Genevie Ann M.D.   On: 06/28/2019 00:27   Dg Addison Bailey G Tube Plc W/fl-no Rad  Result Date: 06/27/2019 CLINICAL DATA:  NG tube placement. EXAM: ABDOMEN - 1 VIEW; DG NASO G TUBE PLC W/FL-NO RAD COMPARISON:  10/21/2013 FINDINGS: Exam demonstrates nasogastric tube with tip over the mid to lower left abdomen and side-port over the left mid abdomen likely over the distal stomach. Bowel gas pattern is nonobstructive. Mild degenerate change of the spine. IMPRESSION: No acute findings. Nasogastric tube with tip over the left mid to lower abdomen likely over the distal stomach. Electronically Signed   By: Marin Olp M.D.   On: 06/27/2019 13:37     TODAY-DAY OF DISCHARGE:  Subjective:    Teresa Gonzalez today has no headache,no chest abdominal pain,no new weakness tingling or numbness, feels much better wants to go home today.   Objective:   Blood pressure (!) 163/76, pulse 88, temperature 97.7 F (36.5 C), temperature source Oral, resp. rate 20, height 5\' 4"  (1.626 m), weight 45.7 kg, SpO2 97 %.  Intake/Output Summary (Last 24 hours) at 06/29/2019 0957 Last data filed at 06/29/2019 0503 Gross per 24 hour  Intake 1132.55 ml  Output 500 ml  Net 632.55 ml   Filed Weights   06/27/19 1107 06/29/19 0443  Weight: 45.1 kg 45.7 kg    Exam: Awake Alert, Oriented *3, No new F.N deficits, Normal affect .AT,PERRAL Supple Neck,No JVD, No cervical lymphadenopathy appriciated.  Symmetrical Chest wall movement, Good air movement bilaterally, CTAB RRR,No Gallops,Rubs or new Murmurs, No Parasternal Heave +ve B.Sounds, Abd Soft, Non tender, No organomegaly appriciated, No rebound -guarding or rigidity. No Cyanosis, Clubbing or  edema, No new Rash or bruise   PERTINENT RADIOLOGIC STUDIES: Dg Abd 1 View  Result Date: 06/27/2019 CLINICAL DATA:  NG tube placement. EXAM: ABDOMEN - 1 VIEW; DG NASO G TUBE PLC W/FL-NO RAD COMPARISON:  10/21/2013 FINDINGS: Exam demonstrates nasogastric tube with tip over the mid to lower left abdomen and side-port over the left mid abdomen likely over the distal stomach. Bowel gas pattern is nonobstructive. Mild degenerate change of the spine. IMPRESSION: No acute findings. Nasogastric tube with tip over the left mid to lower abdomen likely over the distal stomach. Electronically Signed   By: Marin Olp M.D.   On: 06/27/2019 13:37   Ct Abdomen Pelvis W Contrast  Result Date: 06/27/2019 CLINICAL DATA:  Right-sided abdominal pain EXAM: CT ABDOMEN AND PELVIS WITH CONTRAST TECHNIQUE: Multidetector CT imaging of the abdomen and pelvis was performed using the standard protocol following bolus administration of intravenous contrast. CONTRAST:  171mL OMNIPAQUE  IOHEXOL 300 MG/ML  SOLN COMPARISON:  May 22, 2016 FINDINGS: Lower chest: Basilar areas of scarring and some mild subpleural reticulation with areas of dependent atelectasis. Posterior border of the heart is compressed by the large hiatal hernia. Atherosclerotic calcification of the coronary arteries. No visible pericardial effusion. Hepatobiliary: Diminutive appearance of the left lobe liver relative to the right, nonspecific. No focal hepatic abnormality. Persistent moderate biliary ductal dilatation without visible gallstones. No pericholecystic inflammation. Pancreas: Atrophic appearance of the pancreas. No ductal dilatation or peripancreatic inflammation. Spleen: Normal in size without focal abnormality. Adrenals/Urinary Tract: Simple attenuation 3 cm cyst in the interpolar right kidney. Additional intermediate attenuation (30-50 HU) cysts in both kidneys are without significant enhancement or washout on excretory delays favoring hyperdense cysts. Additional subcentimeter hypoattenuating foci are too small to fully characterize on CT imaging but statistically likely benign. No hydronephrosis. No obstructive urolithiasis. Urinary bladder is unremarkable. Stomach/Bowel: There is marked fluid distention of the now large hiatal hernia and stomach as well as the proximal small bowel to the level of a somewhat gradual transition point in the deep pelvis (3/61). More distal small bowel is normal caliber. Hyperdense material is seen dependently within the stomach. Does not appear to diffuse on delayed excretory phase imaging. Favors ingested material. Postsurgical changes at the cecal tip suggest prior appendectomy and possible ileocolic anastomosis. Colon is fluid-filled through much of its extent with mild mucosal hyperemia. Vascular/Lymphatic: Extensive severe atherosclerotic calcification is seen throughout the abdominal vasculature. No suspicious or enlarged lymph nodes in the included lymphatic chains.  Reproductive: Uterus is surgically absent. No concerning adnexal lesions. Other: No abdominopelvic free fluid or free gas. No bowel containing hernias. Postsurgical changes of the low anterior abdomen. Musculoskeletal: Dextrocurvature of the thoracolumbar spine with an apex at L2. Mild lateral listhesis of L1 on L2. Compression deformities of L5 and T11 are age indeterminate but new since 2017. IMPRESSION: 1. Marked fluid distention of the now large hiatal hernia and stomach as well as the proximal small bowel to the level of a somewhat gradual transition point in the deep pelvis, concerning for small bowel obstruction. Possibly related to an adhesion given postsurgical changes in the low abdomen. 2. Small amount of hyperdense material in the stomach, favor ingested material/medication such as bismuth containing products. 3. Colon is fluid-filled through much of its extent with mild mucosal hyperemia, suggestive of colitis. 4. Postsurgical changes at the cecal tip suggesting prior appendectomy and possible ileocolic anastomosis. Correlate with patient surgical history. 5. Compression deformities of L5 and T11 are age indeterminate but new  since 2017. Correlate for point tenderness to assess for acuity. 6. Aortic Atherosclerosis (ICD10-I70.0). Electronically Signed   By: Lovena Le M.D.   On: 06/27/2019 02:33   Dg Chest Port 1 View  Result Date: 06/27/2019 CLINICAL DATA:  85 year old female with chest pain. EXAM: PORTABLE CHEST 1 VIEW COMPARISON:  Chest radiographs 02/18/2017 and earlier. FINDINGS: Portable AP semi upright view at 0104 hours. Stable cardiac size and mediastinal contours. Tortuous thoracic aorta. Calcified aortic atherosclerosis. Visualized tracheal air column is within normal limits. Allowing for portable technique the lungs are clear. No pleural effusion or pneumothorax. Paucity of bowel gas in the upper abdomen. No acute osseous abnormality identified. IMPRESSION: No acute cardiopulmonary  abnormality. Aortic Atherosclerosis (ICD10-I70.0). Electronically Signed   By: Genevie Ann M.D.   On: 06/27/2019 01:36   Dg Abd Portable 1v-small Bowel Obstruction Protocol-initial, 8 Hr Delay  Result Date: 06/28/2019 CLINICAL DATA:  85 year old female with suspected small bowel obstruction on CT Abdomen and Pelvis yesterday. EXAM: PORTABLE ABDOMEN - 1 VIEW COMPARISON:  CT Abdomen and Pelvis 06/27/2019. FINDINGS: Portable AP views at 0004 hours. Enteric tube in place, side hole at the level of the gastric body. The stomach now appears decompressed. There is oral contrast throughout the large intestine to the rectum. Diverticulosis of the left colon. No dilated small bowel loops. Stable lung bases. Aortoiliac calcified atherosclerosis. No acute osseous abnormality identified. IMPRESSION: 1. Resolved small bowel obstruction, oral contrast throughout the large bowel to the rectum. 2. NG tube in place with decompressed stomach. 3. Diverticulosis of the left colon. Electronically Signed   By: Genevie Ann M.D.   On: 06/28/2019 00:27   Dg Addison Bailey G Tube Plc W/fl-no Rad  Result Date: 06/27/2019 CLINICAL DATA:  NG tube placement. EXAM: ABDOMEN - 1 VIEW; DG NASO G TUBE PLC W/FL-NO RAD COMPARISON:  10/21/2013 FINDINGS: Exam demonstrates nasogastric tube with tip over the mid to lower left abdomen and side-port over the left mid abdomen likely over the distal stomach. Bowel gas pattern is nonobstructive. Mild degenerate change of the spine. IMPRESSION: No acute findings. Nasogastric tube with tip over the left mid to lower abdomen likely over the distal stomach. Electronically Signed   By: Marin Olp M.D.   On: 06/27/2019 13:37     PERTINENT LAB RESULTS: CBC: Recent Labs    06/27/19 0446 06/28/19 0638  WBC 13.8* 11.9*  HGB 13.5 11.7*  HCT 40.1 36.4  PLT 324 288   CMET CMP     Component Value Date/Time   NA 138 06/29/2019 0528   K 3.2 (L) 06/29/2019 0528   CL 106 06/29/2019 0528   CO2 20 (L) 06/29/2019 0528    GLUCOSE 95 06/29/2019 0528   BUN 11 06/29/2019 0528   CREATININE 0.60 06/29/2019 0528   CALCIUM 8.6 (L) 06/29/2019 0528   PROT 6.9 06/27/2019 0049   ALBUMIN 3.8 06/27/2019 0049   AST 35 06/27/2019 0049   ALT 17 06/27/2019 0049   ALKPHOS 70 06/27/2019 0049   BILITOT 0.7 06/27/2019 0049   GFRNONAA >60 06/29/2019 0528   GFRAA >60 06/29/2019 0528    GFR Estimated Creatinine Clearance: 30.3 mL/min (by C-G formula based on SCr of 0.6 mg/dL). Recent Labs    06/27/19 0049  LIPASE 41   No results for input(s): CKTOTAL, CKMB, CKMBINDEX, TROPONINI in the last 72 hours. Invalid input(s): POCBNP No results for input(s): DDIMER in the last 72 hours. Recent Labs    06/27/19 0446  HGBA1C 5.1   No  results for input(s): CHOL, HDL, LDLCALC, TRIG, CHOLHDL, LDLDIRECT in the last 72 hours. No results for input(s): TSH, T4TOTAL, T3FREE, THYROIDAB in the last 72 hours.  Invalid input(s): FREET3 No results for input(s): VITAMINB12, FOLATE, FERRITIN, TIBC, IRON, RETICCTPCT in the last 72 hours. Coags: No results for input(s): INR in the last 72 hours.  Invalid input(s): PT Microbiology: Recent Results (from the past 240 hour(s))  SARS CORONAVIRUS 2 (TAT 6-24 HRS) Nasopharyngeal Nasopharyngeal Swab     Status: None   Collection Time: 06/27/19  3:40 AM   Specimen: Nasopharyngeal Swab  Result Value Ref Range Status   SARS Coronavirus 2 NEGATIVE NEGATIVE Final    Comment: (NOTE) SARS-CoV-2 target nucleic acids are NOT DETECTED. The SARS-CoV-2 RNA is generally detectable in upper and lower respiratory specimens during the acute phase of infection. Negative results do not preclude SARS-CoV-2 infection, do not rule out co-infections with other pathogens, and should not be used as the sole basis for treatment or other patient management decisions. Negative results must be combined with clinical observations, patient history, and epidemiological information. The expected result is  Negative. Fact Sheet for Patients: SugarRoll.be Fact Sheet for Healthcare Providers: https://www.woods-mathews.com/ This test is not yet approved or cleared by the [place] FDA and  has been authorized for detection and/or diagnosis of SARS-CoV-2 by FDA under an Emergency Use Authorization (EUA). This EUA will remain  in effect (meaning this test can be used) for the duration of the COVID-19 declaration under Section 56 4(b)(1) of the Act, 21 U.S.C. section 360bbb-3(b)(1), unless the authorization is terminated or revoked sooner. Performed at [facility], [place] [address] [place], [place] [address]     FURTHER DISCHARGE INSTRUCTIONS:  Get Medicines reviewed and adjusted: Please take all your medications with you for your next visit with your Primary MD  Laboratory/radiological data: Please request your Primary MD to go over all hospital tests and procedure/radiological results at the follow up, please ask your Primary MD to get all Hospital records sent to his/her office.  In some cases, they will be blood work, cultures and biopsy results pending at the time of your discharge. Please request that your primary care M.D. goes through all the records of your hospital data and follows up on these results.  Also Note the following: If you experience worsening of your admission symptoms, develop shortness of breath, life threatening emergency, suicidal or homicidal thoughts you must seek medical attention immediately by calling 911 or calling your MD immediately  if symptoms less severe.  You must read complete instructions/literature along with all the possible adverse reactions/side effects for all the Medicines you take and that have been prescribed to you. Take any new Medicines after you have completely understood and accpet all the possible adverse reactions/side effects.   Do not drive when taking Pain medications or sleeping  medications (Benzodaizepines)  Do not take more than prescribed Pain, Sleep and Anxiety Medications. It is not advisable to combine anxiety,sleep and pain medications without talking with your primary care practitioner  Special Instructions: If you have smoked or chewed Tobacco  in the last 2 yrs please stop smoking, stop any regular Alcohol  and or any Recreational drug use.  Wear Seat belts while driving.  Please note: You were cared for by a hospitalist during your hospital stay. Once you are discharged, your primary care physician will handle any further medical issues. Please note that NO REFILLS for any discharge medications will be authorized once you  are discharged, as it is imperative that you return to your primary care physician (or establish a relationship with a primary care physician if you do not have one) for your post hospital discharge needs so that they can reassess your need for medications and monitor your lab values.  Total Time spent coordinating discharge including counseling, education and face to face time equals 35 minutes.  SignedOren Binet 06/29/2019 9:57 AM

## 2019-06-29 NOTE — Progress Notes (Signed)
Physical Therapy Treatment Patient Details Name: Teresa Gonzalez MRN: [id number] DOB: [birth year] Today's Date: 06/29/2019    History of Present Illness Pt is a 85 yo female admitted 06/27/19 with sudden onset nausea, vomiting and abdominal pain. MI ruled out by cardiology. CT showed large hiatal hernia and enlarged stomach; proximal small bowel dilated. PMH A-fib, hypothyroidism, mitral regurgitation, L carpal tunnel, peripheral neuropathy, hx small bowel resection, CA.   PT Comments    Pt progressing with mobility. Ambulatory in room with RW and min guard for balance, intermittent minA due to instability. Pt easily fatigued, limited by decreased activity tolerance and generalized weakness. Plans to d/c home today, will benefit from HHPT services. Daughter present and reports pt has 24/7 assist and necessary DME. Educ re: energy conservation and fall risk reduction strategies. If to remain admitted, will follow acutely.    Follow Up Recommendations  Home health PT;Supervision/Assistance - 24 hour     Equipment Recommendations  None recommended by PT    Recommendations for Other Services       Precautions / Restrictions Precautions Precautions: Fall Precaution Comments: Urine incontinence (typically wears Depends) Restrictions Weight Bearing Restrictions: No    Mobility  Bed Mobility Overal bed mobility: Modified Independent Bed Mobility: Supine to Sit           General bed mobility comments: Increased time and effort, no physical assist required; bed flat  Transfers Overall transfer level: Needs assistance Equipment used: Rolling walker (2 wheeled) Transfers: Sit to/from Stand Sit to Stand: Min guard;Min assist         General transfer comment: MinA to maintain balance upon standing; stability improved standing from [facility] over toilet, min guard for balance  Ambulation/Gait Ambulation/Gait assistance: Min guard Gait Distance (Feet): 30 Feet Assistive device: Rolling  walker (2 wheeled) Gait Pattern/deviations: Step-through pattern;Decreased stride length;Trunk flexed Gait velocity: Decreased Gait velocity interpretation: <1.31 ft/sec, indicative of household ambulator General Gait Details: Slow, mildly unsteady gait with RW and close min guard for balance; pt with some difficulty navigating RW (typically uses rollator at home); no LOB, easily fatigued   Stairs             Wheelchair Mobility    Modified Rankin (Stroke Patients Only)       Balance Overall balance assessment: Needs assistance;History of Falls Sitting-balance support: Bilateral upper extremity supported;Feet supported Sitting balance-Leahy Scale: Good       Standing balance-Leahy Scale: Poor Standing balance comment: Reliant on UE support; leaning trunk on sink when washing hands                            Cognition Arousal/Alertness: Awake/alert Behavior During Therapy: WFL for tasks assessed/performed Overall Cognitive Status: Within Functional Limits for tasks assessed                                        Exercises      General Comments General comments (skin integrity, edema, etc.): Daughter present at end of session; verifies pt has 24/7 assist and DME      Pertinent Vitals/Pain Pain Assessment: No/denies pain    Home Living                      Prior Function            PT Goals (current goals can  now be found in the care plan section) Acute Rehab PT Goals Patient Stated Goal: go home, get back to routine PT Goal Formulation: With patient/family Time For Goal Achievement: 07/12/19 Potential to Achieve Goals: Good Progress towards PT goals: Progressing toward goals    Frequency    Min 3X/week      PT Plan Current plan remains appropriate    Co-evaluation              AM-PAC PT "6 Clicks" Mobility   Outcome Measure  Help needed turning from your back to your side while in a flat bed without  using bedrails?: None Help needed moving from lying on your back to sitting on the side of a flat bed without using bedrails?: None Help needed moving to and from a bed to a chair (including a wheelchair)?: A Little Help needed standing up from a chair using your arms (e.g., wheelchair or bedside chair)?: A Little Help needed to walk in hospital room?: A Little Help needed climbing 3-5 steps with a railing? : A Little 6 Click Score: 20    End of Session Equipment Utilized During Treatment: Gait belt Activity Tolerance: Patient tolerated treatment well Patient left: in chair;with call bell/phone within reach;with chair alarm set;with family/visitor present Nurse Communication: Mobility status PT Visit Diagnosis: Unsteadiness on feet (R26.81);History of falling (Z91.81);Muscle weakness (generalized) (M62.81);Difficulty in walking, not elsewhere classified (R26.2)     Time: [id number] PT Time Calculation (min) (ACUTE ONLY): 21 min  Charges:  $Therapeutic Activity: 8-22 mins                    Mabeline Caras, PT, DPT Acute Rehabilitation Services  Pager [phone] Office [phone]  Derry Lory 06/29/2019, 12:20 PM

## 2019-07-02 DIAGNOSIS — I051 Rheumatic mitral insufficiency: Secondary | ICD-10-CM | POA: Diagnosis not present

## 2019-07-02 DIAGNOSIS — I480 Paroxysmal atrial fibrillation: Secondary | ICD-10-CM | POA: Diagnosis not present

## 2019-07-02 DIAGNOSIS — I700 Atherosclerosis of aorta: Secondary | ICD-10-CM | POA: Diagnosis not present

## 2019-07-02 DIAGNOSIS — M8088XD Other osteoporosis with current pathological fracture, vertebra(e), subsequent encounter for fracture with routine healing: Secondary | ICD-10-CM | POA: Diagnosis not present

## 2019-07-02 DIAGNOSIS — K449 Diaphragmatic hernia without obstruction or gangrene: Secondary | ICD-10-CM | POA: Diagnosis not present

## 2019-07-02 DIAGNOSIS — N281 Cyst of kidney, acquired: Secondary | ICD-10-CM | POA: Diagnosis not present

## 2019-07-02 DIAGNOSIS — E876 Hypokalemia: Secondary | ICD-10-CM | POA: Diagnosis not present

## 2019-07-02 DIAGNOSIS — I10 Essential (primary) hypertension: Secondary | ICD-10-CM | POA: Diagnosis not present

## 2019-07-02 DIAGNOSIS — M479 Spondylosis, unspecified: Secondary | ICD-10-CM | POA: Diagnosis not present

## 2019-07-05 DIAGNOSIS — I10 Essential (primary) hypertension: Secondary | ICD-10-CM | POA: Diagnosis not present

## 2019-07-05 DIAGNOSIS — N281 Cyst of kidney, acquired: Secondary | ICD-10-CM | POA: Diagnosis not present

## 2019-07-05 DIAGNOSIS — I051 Rheumatic mitral insufficiency: Secondary | ICD-10-CM | POA: Diagnosis not present

## 2019-07-05 DIAGNOSIS — M8088XD Other osteoporosis with current pathological fracture, vertebra(e), subsequent encounter for fracture with routine healing: Secondary | ICD-10-CM | POA: Diagnosis not present

## 2019-07-05 DIAGNOSIS — M479 Spondylosis, unspecified: Secondary | ICD-10-CM | POA: Diagnosis not present

## 2019-07-05 DIAGNOSIS — E876 Hypokalemia: Secondary | ICD-10-CM | POA: Diagnosis not present

## 2019-07-05 DIAGNOSIS — K449 Diaphragmatic hernia without obstruction or gangrene: Secondary | ICD-10-CM | POA: Diagnosis not present

## 2019-07-05 DIAGNOSIS — I700 Atherosclerosis of aorta: Secondary | ICD-10-CM | POA: Diagnosis not present

## 2019-07-05 DIAGNOSIS — I480 Paroxysmal atrial fibrillation: Secondary | ICD-10-CM | POA: Diagnosis not present

## 2019-07-06 DIAGNOSIS — K449 Diaphragmatic hernia without obstruction or gangrene: Secondary | ICD-10-CM | POA: Diagnosis not present

## 2019-07-06 DIAGNOSIS — M8088XD Other osteoporosis with current pathological fracture, vertebra(e), subsequent encounter for fracture with routine healing: Secondary | ICD-10-CM | POA: Diagnosis not present

## 2019-07-06 DIAGNOSIS — M479 Spondylosis, unspecified: Secondary | ICD-10-CM | POA: Diagnosis not present

## 2019-07-06 DIAGNOSIS — E876 Hypokalemia: Secondary | ICD-10-CM | POA: Diagnosis not present

## 2019-07-06 DIAGNOSIS — I051 Rheumatic mitral insufficiency: Secondary | ICD-10-CM | POA: Diagnosis not present

## 2019-07-06 DIAGNOSIS — N281 Cyst of kidney, acquired: Secondary | ICD-10-CM | POA: Diagnosis not present

## 2019-07-06 DIAGNOSIS — I480 Paroxysmal atrial fibrillation: Secondary | ICD-10-CM | POA: Diagnosis not present

## 2019-07-06 DIAGNOSIS — I700 Atherosclerosis of aorta: Secondary | ICD-10-CM | POA: Diagnosis not present

## 2019-07-06 DIAGNOSIS — I10 Essential (primary) hypertension: Secondary | ICD-10-CM | POA: Diagnosis not present

## 2019-07-09 DIAGNOSIS — E876 Hypokalemia: Secondary | ICD-10-CM | POA: Diagnosis not present

## 2019-07-09 DIAGNOSIS — M479 Spondylosis, unspecified: Secondary | ICD-10-CM | POA: Diagnosis not present

## 2019-07-09 DIAGNOSIS — M8088XD Other osteoporosis with current pathological fracture, vertebra(e), subsequent encounter for fracture with routine healing: Secondary | ICD-10-CM | POA: Diagnosis not present

## 2019-07-09 DIAGNOSIS — N281 Cyst of kidney, acquired: Secondary | ICD-10-CM | POA: Diagnosis not present

## 2019-07-09 DIAGNOSIS — K449 Diaphragmatic hernia without obstruction or gangrene: Secondary | ICD-10-CM | POA: Diagnosis not present

## 2019-07-09 DIAGNOSIS — I10 Essential (primary) hypertension: Secondary | ICD-10-CM | POA: Diagnosis not present

## 2019-07-09 DIAGNOSIS — I051 Rheumatic mitral insufficiency: Secondary | ICD-10-CM | POA: Diagnosis not present

## 2019-07-09 DIAGNOSIS — I480 Paroxysmal atrial fibrillation: Secondary | ICD-10-CM | POA: Diagnosis not present

## 2019-07-09 DIAGNOSIS — I700 Atherosclerosis of aorta: Secondary | ICD-10-CM | POA: Diagnosis not present

## 2019-07-12 DIAGNOSIS — E876 Hypokalemia: Secondary | ICD-10-CM | POA: Diagnosis not present

## 2019-07-12 DIAGNOSIS — K449 Diaphragmatic hernia without obstruction or gangrene: Secondary | ICD-10-CM | POA: Diagnosis not present

## 2019-07-12 DIAGNOSIS — I10 Essential (primary) hypertension: Secondary | ICD-10-CM | POA: Diagnosis not present

## 2019-07-12 DIAGNOSIS — I700 Atherosclerosis of aorta: Secondary | ICD-10-CM | POA: Diagnosis not present

## 2019-07-12 DIAGNOSIS — I051 Rheumatic mitral insufficiency: Secondary | ICD-10-CM | POA: Diagnosis not present

## 2019-07-12 DIAGNOSIS — N281 Cyst of kidney, acquired: Secondary | ICD-10-CM | POA: Diagnosis not present

## 2019-07-12 DIAGNOSIS — M479 Spondylosis, unspecified: Secondary | ICD-10-CM | POA: Diagnosis not present

## 2019-07-12 DIAGNOSIS — I480 Paroxysmal atrial fibrillation: Secondary | ICD-10-CM | POA: Diagnosis not present

## 2019-07-12 DIAGNOSIS — M8088XD Other osteoporosis with current pathological fracture, vertebra(e), subsequent encounter for fracture with routine healing: Secondary | ICD-10-CM | POA: Diagnosis not present

## 2019-07-13 DIAGNOSIS — Z7901 Long term (current) use of anticoagulants: Secondary | ICD-10-CM | POA: Diagnosis not present

## 2019-07-13 DIAGNOSIS — E538 Deficiency of other specified B group vitamins: Secondary | ICD-10-CM | POA: Diagnosis not present

## 2019-07-13 DIAGNOSIS — I4891 Unspecified atrial fibrillation: Secondary | ICD-10-CM | POA: Diagnosis not present

## 2019-07-13 DIAGNOSIS — I130 Hypertensive heart and chronic kidney disease with heart failure and stage 1 through stage 4 chronic kidney disease, or unspecified chronic kidney disease: Secondary | ICD-10-CM | POA: Diagnosis not present

## 2019-07-13 DIAGNOSIS — I480 Paroxysmal atrial fibrillation: Secondary | ICD-10-CM | POA: Diagnosis not present

## 2019-07-13 DIAGNOSIS — Z23 Encounter for immunization: Secondary | ICD-10-CM | POA: Diagnosis not present

## 2019-07-13 DIAGNOSIS — I2510 Atherosclerotic heart disease of native coronary artery without angina pectoris: Secondary | ICD-10-CM | POA: Diagnosis not present

## 2019-07-13 DIAGNOSIS — Z0000 Encounter for general adult medical examination without abnormal findings: Secondary | ICD-10-CM | POA: Diagnosis not present

## 2019-07-13 DIAGNOSIS — I119 Hypertensive heart disease without heart failure: Secondary | ICD-10-CM | POA: Diagnosis not present

## 2019-07-13 DIAGNOSIS — I8310 Varicose veins of unspecified lower extremity with inflammation: Secondary | ICD-10-CM | POA: Diagnosis not present

## 2019-07-13 DIAGNOSIS — I2721 Secondary pulmonary arterial hypertension: Secondary | ICD-10-CM | POA: Diagnosis not present

## 2019-07-13 DIAGNOSIS — I429 Cardiomyopathy, unspecified: Secondary | ICD-10-CM | POA: Diagnosis not present

## 2019-07-13 DIAGNOSIS — N182 Chronic kidney disease, stage 2 (mild): Secondary | ICD-10-CM | POA: Diagnosis not present

## 2019-07-13 DIAGNOSIS — E559 Vitamin D deficiency, unspecified: Secondary | ICD-10-CM | POA: Diagnosis not present

## 2019-07-13 DIAGNOSIS — I700 Atherosclerosis of aorta: Secondary | ICD-10-CM | POA: Diagnosis not present

## 2019-07-13 DIAGNOSIS — E038 Other specified hypothyroidism: Secondary | ICD-10-CM | POA: Diagnosis not present

## 2019-07-13 DIAGNOSIS — D692 Other nonthrombocytopenic purpura: Secondary | ICD-10-CM | POA: Diagnosis not present

## 2019-07-17 DIAGNOSIS — I480 Paroxysmal atrial fibrillation: Secondary | ICD-10-CM | POA: Diagnosis not present

## 2019-07-17 DIAGNOSIS — M8088XD Other osteoporosis with current pathological fracture, vertebra(e), subsequent encounter for fracture with routine healing: Secondary | ICD-10-CM | POA: Diagnosis not present

## 2019-07-17 DIAGNOSIS — K449 Diaphragmatic hernia without obstruction or gangrene: Secondary | ICD-10-CM | POA: Diagnosis not present

## 2019-07-17 DIAGNOSIS — I051 Rheumatic mitral insufficiency: Secondary | ICD-10-CM | POA: Diagnosis not present

## 2019-07-17 DIAGNOSIS — E876 Hypokalemia: Secondary | ICD-10-CM | POA: Diagnosis not present

## 2019-07-17 DIAGNOSIS — N281 Cyst of kidney, acquired: Secondary | ICD-10-CM | POA: Diagnosis not present

## 2019-07-17 DIAGNOSIS — M479 Spondylosis, unspecified: Secondary | ICD-10-CM | POA: Diagnosis not present

## 2019-07-17 DIAGNOSIS — I700 Atherosclerosis of aorta: Secondary | ICD-10-CM | POA: Diagnosis not present

## 2019-07-17 DIAGNOSIS — I10 Essential (primary) hypertension: Secondary | ICD-10-CM | POA: Diagnosis not present

## 2019-07-19 DIAGNOSIS — N281 Cyst of kidney, acquired: Secondary | ICD-10-CM | POA: Diagnosis not present

## 2019-07-19 DIAGNOSIS — I10 Essential (primary) hypertension: Secondary | ICD-10-CM | POA: Diagnosis not present

## 2019-07-19 DIAGNOSIS — I051 Rheumatic mitral insufficiency: Secondary | ICD-10-CM | POA: Diagnosis not present

## 2019-07-19 DIAGNOSIS — M479 Spondylosis, unspecified: Secondary | ICD-10-CM | POA: Diagnosis not present

## 2019-07-19 DIAGNOSIS — I700 Atherosclerosis of aorta: Secondary | ICD-10-CM | POA: Diagnosis not present

## 2019-07-19 DIAGNOSIS — I480 Paroxysmal atrial fibrillation: Secondary | ICD-10-CM | POA: Diagnosis not present

## 2019-07-19 DIAGNOSIS — E876 Hypokalemia: Secondary | ICD-10-CM | POA: Diagnosis not present

## 2019-07-19 DIAGNOSIS — M8088XD Other osteoporosis with current pathological fracture, vertebra(e), subsequent encounter for fracture with routine healing: Secondary | ICD-10-CM | POA: Diagnosis not present

## 2019-07-19 DIAGNOSIS — K449 Diaphragmatic hernia without obstruction or gangrene: Secondary | ICD-10-CM | POA: Diagnosis not present

## 2019-07-23 DIAGNOSIS — I700 Atherosclerosis of aorta: Secondary | ICD-10-CM | POA: Diagnosis not present

## 2019-07-23 DIAGNOSIS — N281 Cyst of kidney, acquired: Secondary | ICD-10-CM | POA: Diagnosis not present

## 2019-07-23 DIAGNOSIS — E876 Hypokalemia: Secondary | ICD-10-CM | POA: Diagnosis not present

## 2019-07-23 DIAGNOSIS — M8088XD Other osteoporosis with current pathological fracture, vertebra(e), subsequent encounter for fracture with routine healing: Secondary | ICD-10-CM | POA: Diagnosis not present

## 2019-07-23 DIAGNOSIS — K449 Diaphragmatic hernia without obstruction or gangrene: Secondary | ICD-10-CM | POA: Diagnosis not present

## 2019-07-23 DIAGNOSIS — M479 Spondylosis, unspecified: Secondary | ICD-10-CM | POA: Diagnosis not present

## 2019-07-23 DIAGNOSIS — I10 Essential (primary) hypertension: Secondary | ICD-10-CM | POA: Diagnosis not present

## 2019-07-23 DIAGNOSIS — I051 Rheumatic mitral insufficiency: Secondary | ICD-10-CM | POA: Diagnosis not present

## 2019-07-23 DIAGNOSIS — I480 Paroxysmal atrial fibrillation: Secondary | ICD-10-CM | POA: Diagnosis not present

## 2019-07-26 DIAGNOSIS — I700 Atherosclerosis of aorta: Secondary | ICD-10-CM | POA: Diagnosis not present

## 2019-07-26 DIAGNOSIS — M8088XD Other osteoporosis with current pathological fracture, vertebra(e), subsequent encounter for fracture with routine healing: Secondary | ICD-10-CM | POA: Diagnosis not present

## 2019-07-26 DIAGNOSIS — E876 Hypokalemia: Secondary | ICD-10-CM | POA: Diagnosis not present

## 2019-07-26 DIAGNOSIS — I051 Rheumatic mitral insufficiency: Secondary | ICD-10-CM | POA: Diagnosis not present

## 2019-07-26 DIAGNOSIS — I480 Paroxysmal atrial fibrillation: Secondary | ICD-10-CM | POA: Diagnosis not present

## 2019-07-26 DIAGNOSIS — I10 Essential (primary) hypertension: Secondary | ICD-10-CM | POA: Diagnosis not present

## 2019-07-26 DIAGNOSIS — K449 Diaphragmatic hernia without obstruction or gangrene: Secondary | ICD-10-CM | POA: Diagnosis not present

## 2019-07-26 DIAGNOSIS — N281 Cyst of kidney, acquired: Secondary | ICD-10-CM | POA: Diagnosis not present

## 2019-07-26 DIAGNOSIS — M479 Spondylosis, unspecified: Secondary | ICD-10-CM | POA: Diagnosis not present

## 2019-07-30 DIAGNOSIS — I10 Essential (primary) hypertension: Secondary | ICD-10-CM | POA: Diagnosis not present

## 2019-07-30 DIAGNOSIS — M479 Spondylosis, unspecified: Secondary | ICD-10-CM | POA: Diagnosis not present

## 2019-07-30 DIAGNOSIS — E876 Hypokalemia: Secondary | ICD-10-CM | POA: Diagnosis not present

## 2019-07-30 DIAGNOSIS — M8088XD Other osteoporosis with current pathological fracture, vertebra(e), subsequent encounter for fracture with routine healing: Secondary | ICD-10-CM | POA: Diagnosis not present

## 2019-07-30 DIAGNOSIS — I700 Atherosclerosis of aorta: Secondary | ICD-10-CM | POA: Diagnosis not present

## 2019-07-30 DIAGNOSIS — I051 Rheumatic mitral insufficiency: Secondary | ICD-10-CM | POA: Diagnosis not present

## 2019-07-30 DIAGNOSIS — I480 Paroxysmal atrial fibrillation: Secondary | ICD-10-CM | POA: Diagnosis not present

## 2019-07-30 DIAGNOSIS — N281 Cyst of kidney, acquired: Secondary | ICD-10-CM | POA: Diagnosis not present

## 2019-07-30 DIAGNOSIS — K449 Diaphragmatic hernia without obstruction or gangrene: Secondary | ICD-10-CM | POA: Diagnosis not present

## 2019-08-06 DIAGNOSIS — K449 Diaphragmatic hernia without obstruction or gangrene: Secondary | ICD-10-CM | POA: Diagnosis not present

## 2019-08-06 DIAGNOSIS — I480 Paroxysmal atrial fibrillation: Secondary | ICD-10-CM | POA: Diagnosis not present

## 2019-08-06 DIAGNOSIS — E876 Hypokalemia: Secondary | ICD-10-CM | POA: Diagnosis not present

## 2019-08-06 DIAGNOSIS — N281 Cyst of kidney, acquired: Secondary | ICD-10-CM | POA: Diagnosis not present

## 2019-08-06 DIAGNOSIS — M479 Spondylosis, unspecified: Secondary | ICD-10-CM | POA: Diagnosis not present

## 2019-08-06 DIAGNOSIS — I700 Atherosclerosis of aorta: Secondary | ICD-10-CM | POA: Diagnosis not present

## 2019-08-06 DIAGNOSIS — I10 Essential (primary) hypertension: Secondary | ICD-10-CM | POA: Diagnosis not present

## 2019-08-06 DIAGNOSIS — M8088XD Other osteoporosis with current pathological fracture, vertebra(e), subsequent encounter for fracture with routine healing: Secondary | ICD-10-CM | POA: Diagnosis not present

## 2019-08-06 DIAGNOSIS — I051 Rheumatic mitral insufficiency: Secondary | ICD-10-CM | POA: Diagnosis not present

## 2019-08-13 DIAGNOSIS — M8088XD Other osteoporosis with current pathological fracture, vertebra(e), subsequent encounter for fracture with routine healing: Secondary | ICD-10-CM | POA: Diagnosis not present

## 2019-08-13 DIAGNOSIS — I051 Rheumatic mitral insufficiency: Secondary | ICD-10-CM | POA: Diagnosis not present

## 2019-08-13 DIAGNOSIS — I700 Atherosclerosis of aorta: Secondary | ICD-10-CM | POA: Diagnosis not present

## 2019-08-13 DIAGNOSIS — M479 Spondylosis, unspecified: Secondary | ICD-10-CM | POA: Diagnosis not present

## 2019-08-13 DIAGNOSIS — K449 Diaphragmatic hernia without obstruction or gangrene: Secondary | ICD-10-CM | POA: Diagnosis not present

## 2019-08-13 DIAGNOSIS — I480 Paroxysmal atrial fibrillation: Secondary | ICD-10-CM | POA: Diagnosis not present

## 2019-08-13 DIAGNOSIS — N281 Cyst of kidney, acquired: Secondary | ICD-10-CM | POA: Diagnosis not present

## 2019-08-13 DIAGNOSIS — I10 Essential (primary) hypertension: Secondary | ICD-10-CM | POA: Diagnosis not present

## 2019-08-13 DIAGNOSIS — E876 Hypokalemia: Secondary | ICD-10-CM | POA: Diagnosis not present

## 2019-10-13 ENCOUNTER — Inpatient Hospital Stay (HOSPITAL_COMMUNITY)
Admission: EM | Admit: 2019-10-13 | Discharge: 2019-10-15 | DRG: 390 | Disposition: A | Discharge status: Home Health | Payer: Medicare HMO | Attending: Family Medicine | Admitting: Family Medicine

## 2019-10-13 ENCOUNTER — Other Ambulatory Visit: Payer: Self-pay

## 2019-10-13 ENCOUNTER — Encounter (HOSPITAL_COMMUNITY): Payer: Self-pay

## 2019-10-13 ENCOUNTER — Emergency Department (HOSPITAL_COMMUNITY): Payer: Medicare HMO

## 2019-10-13 DIAGNOSIS — Z79890 Hormone replacement therapy (postmenopausal): Secondary | ICD-10-CM | POA: Diagnosis not present

## 2019-10-13 DIAGNOSIS — Z923 Personal history of irradiation: Secondary | ICD-10-CM

## 2019-10-13 DIAGNOSIS — I480 Paroxysmal atrial fibrillation: Secondary | ICD-10-CM | POA: Diagnosis present

## 2019-10-13 DIAGNOSIS — Z79899 Other long term (current) drug therapy: Secondary | ICD-10-CM

## 2019-10-13 DIAGNOSIS — Z7901 Long term (current) use of anticoagulants: Secondary | ICD-10-CM

## 2019-10-13 DIAGNOSIS — Z90710 Acquired absence of both cervix and uterus: Secondary | ICD-10-CM | POA: Diagnosis not present

## 2019-10-13 DIAGNOSIS — Z8542 Personal history of malignant neoplasm of other parts of uterus: Secondary | ICD-10-CM | POA: Diagnosis not present

## 2019-10-13 DIAGNOSIS — I4891 Unspecified atrial fibrillation: Secondary | ICD-10-CM | POA: Diagnosis present

## 2019-10-13 DIAGNOSIS — R0789 Other chest pain: Secondary | ICD-10-CM | POA: Diagnosis not present

## 2019-10-13 DIAGNOSIS — Z4682 Encounter for fitting and adjustment of non-vascular catheter: Secondary | ICD-10-CM | POA: Diagnosis not present

## 2019-10-13 DIAGNOSIS — Z66 Do not resuscitate: Secondary | ICD-10-CM | POA: Diagnosis not present

## 2019-10-13 DIAGNOSIS — Z20822 Contact with and (suspected) exposure to covid-19: Secondary | ICD-10-CM | POA: Diagnosis present

## 2019-10-13 DIAGNOSIS — R079 Chest pain, unspecified: Secondary | ICD-10-CM | POA: Diagnosis not present

## 2019-10-13 DIAGNOSIS — I447 Left bundle-branch block, unspecified: Secondary | ICD-10-CM | POA: Diagnosis not present

## 2019-10-13 DIAGNOSIS — Z209 Contact with and (suspected) exposure to unspecified communicable disease: Secondary | ICD-10-CM | POA: Diagnosis not present

## 2019-10-13 DIAGNOSIS — R1110 Vomiting, unspecified: Secondary | ICD-10-CM | POA: Diagnosis not present

## 2019-10-13 DIAGNOSIS — E039 Hypothyroidism, unspecified: Secondary | ICD-10-CM | POA: Diagnosis present

## 2019-10-13 DIAGNOSIS — R509 Fever, unspecified: Secondary | ICD-10-CM

## 2019-10-13 DIAGNOSIS — K56609 Unspecified intestinal obstruction, unspecified as to partial versus complete obstruction: Principal | ICD-10-CM | POA: Diagnosis present

## 2019-10-13 DIAGNOSIS — D72829 Elevated white blood cell count, unspecified: Secondary | ICD-10-CM | POA: Diagnosis present

## 2019-10-13 DIAGNOSIS — I499 Cardiac arrhythmia, unspecified: Secondary | ICD-10-CM | POA: Diagnosis not present

## 2019-10-13 DIAGNOSIS — I213 ST elevation (STEMI) myocardial infarction of unspecified site: Secondary | ICD-10-CM | POA: Diagnosis not present

## 2019-10-13 LAB — CBC WITH DIFFERENTIAL/PLATELET
Abs Immature Granulocytes: 0.06 10*3/uL (ref 0.00–0.07)
Basophils Absolute: 0 10*3/uL (ref 0.0–0.1)
Basophils Relative: 0 %
Eosinophils Absolute: 0 10*3/uL (ref 0.0–0.5)
Eosinophils Relative: 0 %
HCT: 41.2 % (ref 36.0–46.0)
Hemoglobin: 13.4 g/dL (ref 12.0–15.0)
Immature Granulocytes: 1 %
Lymphocytes Relative: 10 %
Lymphs Abs: 1.1 10*3/uL (ref 0.7–4.0)
MCH: 30 pg (ref 26.0–34.0)
MCHC: 32.5 g/dL (ref 30.0–36.0)
MCV: 92.4 fL (ref 80.0–100.0)
Monocytes Absolute: 0.4 10*3/uL (ref 0.1–1.0)
Monocytes Relative: 3 %
Neutro Abs: 9.1 10*3/uL — ABNORMAL HIGH (ref 1.7–7.7)
Neutrophils Relative %: 86 %
Platelets: 355 10*3/uL (ref 150–400)
RBC: 4.46 MIL/uL (ref 3.87–5.11)
RDW: 14.6 % (ref 11.5–15.5)
WBC: 10.6 10*3/uL — ABNORMAL HIGH (ref 4.0–10.5)
nRBC: 0 % (ref 0.0–0.2)

## 2019-10-13 MED ORDER — ONDANSETRON HCL 4 MG/2ML IJ SOLN
4.0000 mg | Freq: Once | INTRAMUSCULAR | Status: AC
  Administered 2019-10-13: 4 mg via INTRAVENOUS
  Filled 2019-10-13: qty 2

## 2019-10-13 MED ORDER — FENTANYL CITRATE (PF) 100 MCG/2ML IJ SOLN
50.0000 ug | Freq: Once | INTRAMUSCULAR | Status: AC
  Administered 2019-10-13: 50 ug via INTRAVENOUS
  Filled 2019-10-13: qty 2

## 2019-10-13 NOTE — ED Triage Notes (Signed)
Pt bib [facility] after c/o non radiating chest pain. Pt endorses SOB, N/V/D. Pt had emesis episode w/ EMS. Code STEMI called by EMS and cancelled on arrival. Pt received 1 nitro, 324mg  of aspirin with improved chest pain. Currently pt denies chest pain. Pt had recent exposure to covid. Pt had temp of 100.8 with EMS and received 1000mg  tylenol. EMS 12 lead showed BBB. EMS VSS.

## 2019-10-13 NOTE — ED Provider Notes (Signed)
TIME SEEN: 11:25 PM  CHIEF COMPLAINT: Chest pain, abdominal pain, fever  HPI: Patient is a 86 year old female with history of atrial fibrillation on Xarelto, recent bowel obstruction in September who presents to the emergency department with complaints of diffuse chest tightness that started at 2:30 PM today.  States she is also had diffuse abdominal pain, nausea, vomiting and diarrhea.  Unsure if the symptoms feel similar to when she had her bowel obstruction in September.  Also found to have fever of 100.8 with EMS.  EMS gave her 1 nitroglycerin tablet, 325 mg of aspirin and 1000 g of Tylenol in route.  Patient reports she has had a cough.  No shortness of breath.  She was exposed to someone with Covid on December 24.  She has not had a Covid test.  ROS: See HPI Constitutional:  fever  Eyes: no drainage  ENT: no runny nose   Cardiovascular:   chest pain  Resp: no SOB  GI:  Vomiting and diarrhea GU: no dysuria Integumentary: no rash  Allergy: no hives  Musculoskeletal: no leg swelling  Neurological: no slurred speech ROS otherwise negative  PAST MEDICAL HISTORY/PAST SURGICAL HISTORY:  Past Medical History:  Diagnosis Date  . Atrial fibrillation ([place])   . Depression   . Depression   . Hypothyroidism   . Insomnia   . Left carpal tunnel syndrome    By nerve conduction study  . Peripheral neuropathy   . S/P small bowel resection    with Diarrhea  . SBO (small bowel obstruction) ([place]) 06/2019  . Status post radiation therapy   . Uterine cancer ([place])   . Vitamin D deficiency     MEDICATIONS:  Prior to Admission medications   Medication Sig Start Date End Date Taking? Authorizing Provider  Calcium Carbonate (CALTRATE 600 PO) Take 600 mg by mouth 3 (three) times daily.     [provider]  Cholecalciferol (VITAMIN D3) 1000 units CAPS Take 1,000 Units by mouth daily.    [provider]  furosemide (LASIX) 20 MG tablet Take 20 mg by mouth daily.    [provider]  gabapentin (NEURONTIN) 100 MG capsule Take 100 mg by mouth 2 (two) times daily as needed (nerve pain).    [provider]  loperamide (IMODIUM) 2 MG capsule Take 1 capsule (2 mg total) by mouth 5 (five) times daily as needed for diarrhea or loose stools. 10/23/13   Shon Baton, MD  LORazepam (ATIVAN) 0.5 MG tablet Take 0.5 mg by mouth daily as needed for anxiety. 01/26/17   [provider]  magnesium oxide (MAG-OX) 400 (241.3 Mg) MG tablet Take 1 tablet (400 mg total) by mouth daily. 04/01/17   Nita Sells, MD  meclizine (ANTIVERT) 12.5 MG tablet Take 12.5 mg by mouth 3 (three) times daily as needed for dizziness.    [provider]  methocarbamol (ROBAXIN) 500 MG tablet Take 500 mg by mouth every 8 (eight) hours as needed for muscle spasms.    [provider]  metoprolol tartrate (LOPRESSOR) 25 MG tablet Take 1 tablet (25 mg total) by mouth 2 (two) times daily. 08/06/15   Cherene Altes, MD  Multiple Vitamins-Minerals (MULTIVITAMIN WITH MINERALS) tablet Take 1 tablet by mouth daily.    [provider]  potassium chloride SA (K-DUR,KLOR-CON) 20 MEQ tablet Take 2 tablets (40 mEq total) by mouth daily. Patient taking differently: Take 20 mEq by mouth 2 (two) times daily.  04/01/17   Nita Sells, MD  raloxifene (EVISTA) 60 MG tablet Take 60 mg by mouth daily.    [provider]  rivaroxaban (XARELTO) 10 MG TABS tablet Take 10 mg by mouth daily.    [provider]  SYNTHROID 50 MCG tablet Take 50 mcg by mouth daily. 07/01/15   [provider]  VITAMIN B1-B12 IJ Inject 1 mL into the skin every 21 ( twenty-one) days.     [provider]  Vitamin D, Ergocalciferol, (DRISDOL) 50000 UNITS CAPS Take 50,000 Units by mouth See admin instructions. Takes on Monday, Wednesday, Friday    [provider]    ALLERGIES:  Allergies  Allergen Reactions  . Codeine Itching  . Penicillins Swelling         SOCIAL HISTORY:  Social History   Tobacco Use  . Smoking status: Never Smoker  . Smokeless tobacco: Never Used  Substance Use Topics  . Alcohol use: No    FAMILY HISTORY: Family History  Problem Relation Age of Onset  . Stroke Mother   . Aneurysm Father   . Diabetes Brother     EXAM: BP 132/90   Pulse 89   Temp 97.9 F (36.6 C) (Oral)   Resp (!) 25   SpO2 96%  CONSTITUTIONAL: Alert and oriented and responds appropriately to questions.  Elderly, appears uncomfortable but not in distress, nontoxic appearing HEAD: Normocephalic EYES: Conjunctivae clear, pupils appear equal, EOM appear intact ENT: normal nose; moist mucous membranes NECK: Supple, normal ROM CARD: RRR; S1 and S2 appreciated; no murmurs, no clicks, no rubs, no gallops RESP: Normal chest excursion without splinting or tachypnea; breath sounds clear and equal bilaterally; no wheezes, no rhonchi, no rales, no hypoxia or respiratory distress, speaking full sentences ABD/GI: Normal bowel sounds; non-distended; soft, non-tender, no rebound, no guarding, no peritoneal signs, no hepatosplenomegaly BACK:  The back appears normal EXT: Normal ROM in all joints; no deformity noted, no edema; no cyanosis SKIN: Normal color for age and race; warm; no rash on exposed skin NEURO: Moves all extremities equally, normal speech PSYCH: The patient's mood and manner are appropriate.   MEDICAL DECISION MAKING: Patient here with atypical chest pain.  Initially called as a code STEMI but this was canceled in route.  Currently EKG shows left bundle branch block consistent with previous EKGs and does not meet [place]'s criteria.  She also complains of abdominal pain, nausea, vomiting and diarrhea and presented in a similar fashion in September and was found to have a small bowel obstruction.  She also was noted to have a fever reports a positive Covid exposure over a week ago.  Will obtain labs, blood cultures, urine and urine  culture, chest x-ray, CT of abdomen pelvis and Covid swab.  Differential includes COVID-19 infection, pneumonia, bowel obstruction, colitis, diverticulitis, UTI, bacteremia.  Less likely PE, dissection.  ED PROGRESS: Patient's labs show mild leukocytosis with left shift which appears to be chronic for patient..  Lactate mildly elevated at 2.0.  Will hydrate patient.  Troponin negative.  Rapid Covid antigen negative.  Will send PCR testing.  Chest x-ray clear.  CT scan shows small bowel obstruction.  Will place NG tube.  She has diverticular disease without diverticulitis.  Patient reports pain is improved and nausea has improved.  Chest pain is now gone.  Will discuss with medicine for admission.  PCP is Dr. Shon Baton with [facility].  Magaret Lahman [phone] daughter - updated by phone.  1:46 AM Discussed patient's case with hospitalist, Dr. Myna Hidalgo.  I have recommended admission and patient (and family if present) agree with this plan. Admitting physician will place admission orders.   I reviewed all nursing notes, vitals, pertinent previous records and interpreted all EKGs, lab and urine results, imaging (as available).     EKG Interpretation  Date/Time:  Saturday October 13 2019 23:07:07 EST Ventricular Rate:  92 PR Interval:    QRS Duration: 123 QT Interval:  367 QTC Calculation: [id number] Axis:   -60 Text Interpretation: Sinus rhythm Atrial premature complex Left bundle branch block No significant change since last tracing Confirmed by Pryor Curia [phone]) on 10/13/2019 11:08:23 PM         Charleen Kirks was evaluated in Emergency Department on 10/13/2019 for the symptoms described in the history of present illness. She was evaluated in the context of the global COVID-19 pandemic, which necessitated consideration that the patient might be at risk for infection with the SARS-CoV-2 virus that causes COVID-19. Institutional protocols and algorithms that pertain to the  evaluation of patients at risk for COVID-19 are in a state of rapid change based on information released by regulatory bodies including the CDC and federal and state organizations. These policies and algorithms were followed during the patient's care in the ED.  Patient was seen wearing N95, face shield, gloves, gown.    Chyanna Flock, Delice Bison, DO 10/14/19 [phone]

## 2019-10-14 ENCOUNTER — Emergency Department (HOSPITAL_COMMUNITY): Payer: Medicare HMO

## 2019-10-14 ENCOUNTER — Inpatient Hospital Stay (HOSPITAL_COMMUNITY): Payer: Medicare HMO

## 2019-10-14 ENCOUNTER — Encounter (HOSPITAL_COMMUNITY): Payer: Self-pay | Admitting: Family Medicine

## 2019-10-14 DIAGNOSIS — Z79899 Other long term (current) drug therapy: Secondary | ICD-10-CM | POA: Diagnosis not present

## 2019-10-14 DIAGNOSIS — Z79890 Hormone replacement therapy (postmenopausal): Secondary | ICD-10-CM | POA: Diagnosis not present

## 2019-10-14 DIAGNOSIS — Z7901 Long term (current) use of anticoagulants: Secondary | ICD-10-CM | POA: Diagnosis not present

## 2019-10-14 DIAGNOSIS — E039 Hypothyroidism, unspecified: Secondary | ICD-10-CM | POA: Diagnosis present

## 2019-10-14 DIAGNOSIS — Z8542 Personal history of malignant neoplasm of other parts of uterus: Secondary | ICD-10-CM | POA: Diagnosis not present

## 2019-10-14 DIAGNOSIS — I480 Paroxysmal atrial fibrillation: Secondary | ICD-10-CM

## 2019-10-14 DIAGNOSIS — Z66 Do not resuscitate: Secondary | ICD-10-CM | POA: Diagnosis present

## 2019-10-14 DIAGNOSIS — R0789 Other chest pain: Secondary | ICD-10-CM | POA: Diagnosis not present

## 2019-10-14 DIAGNOSIS — Z923 Personal history of irradiation: Secondary | ICD-10-CM | POA: Diagnosis not present

## 2019-10-14 DIAGNOSIS — Z90710 Acquired absence of both cervix and uterus: Secondary | ICD-10-CM | POA: Diagnosis not present

## 2019-10-14 DIAGNOSIS — K56609 Unspecified intestinal obstruction, unspecified as to partial versus complete obstruction: Secondary | ICD-10-CM | POA: Diagnosis present

## 2019-10-14 DIAGNOSIS — D72829 Elevated white blood cell count, unspecified: Secondary | ICD-10-CM | POA: Diagnosis present

## 2019-10-14 DIAGNOSIS — Z20822 Contact with and (suspected) exposure to covid-19: Secondary | ICD-10-CM | POA: Diagnosis present

## 2019-10-14 LAB — TROPONIN I (HIGH SENSITIVITY)
Troponin I (High Sensitivity): 11 ng/L (ref <18)
Troponin I (High Sensitivity): 14 ng/L (ref <18)

## 2019-10-14 LAB — URINALYSIS, ROUTINE W REFLEX MICROSCOPIC
Bilirubin Urine: NEGATIVE
Glucose, UA: NEGATIVE mg/dL
Ketones, ur: NEGATIVE mg/dL
Nitrite: NEGATIVE
Protein, ur: 30 mg/dL — AB
Specific Gravity, Urine: >1.046 — ABNORMAL HIGH (ref 1.005–1.030)
WBC, UA: >50 WBC/hpf — ABNORMAL HIGH (ref 0–5)
pH: 5 (ref 5.0–8.0)

## 2019-10-14 LAB — CBC WITH DIFFERENTIAL/PLATELET
Abs Immature Granulocytes: 0.05 10*3/uL (ref 0.00–0.07)
Basophils Absolute: 0 10*3/uL (ref 0.0–0.1)
Basophils Relative: 0 %
Eosinophils Absolute: 0 10*3/uL (ref 0.0–0.5)
Eosinophils Relative: 0 %
HCT: 33.8 % — ABNORMAL LOW (ref 36.0–46.0)
Hemoglobin: 10.6 g/dL — ABNORMAL LOW (ref 12.0–15.0)
Immature Granulocytes: 1 %
Lymphocytes Relative: 19 %
Lymphs Abs: 2 10*3/uL (ref 0.7–4.0)
MCH: 29.4 pg (ref 26.0–34.0)
MCHC: 31.4 g/dL (ref 30.0–36.0)
MCV: 93.9 fL (ref 80.0–100.0)
Monocytes Absolute: 0.9 10*3/uL (ref 0.1–1.0)
Monocytes Relative: 8 %
Neutro Abs: 7.9 10*3/uL — ABNORMAL HIGH (ref 1.7–7.7)
Neutrophils Relative %: 72 %
Platelets: 292 10*3/uL (ref 150–400)
RBC: 3.6 MIL/uL — ABNORMAL LOW (ref 3.87–5.11)
RDW: 14.8 % (ref 11.5–15.5)
WBC: 10.8 10*3/uL — ABNORMAL HIGH (ref 4.0–10.5)
nRBC: 0 % (ref 0.0–0.2)

## 2019-10-14 LAB — COMPREHENSIVE METABOLIC PANEL
ALT: 16 U/L (ref 0–44)
AST: 22 U/L (ref 15–41)
Albumin: 3.7 g/dL (ref 3.5–5.0)
Alkaline Phosphatase: 55 U/L (ref 38–126)
Anion gap: 11 (ref 5–15)
BUN: 13 mg/dL (ref 8–23)
CO2: 19 mmol/L — ABNORMAL LOW (ref 22–32)
Calcium: 9.5 mg/dL (ref 8.9–10.3)
Chloride: 109 mmol/L (ref 98–111)
Creatinine, Ser: 0.93 mg/dL (ref 0.44–1.00)
GFR calc Af Amer: >60 mL/min (ref >60)
GFR calc non Af Amer: 52 mL/min — ABNORMAL LOW (ref >60)
Glucose, Bld: 190 mg/dL — ABNORMAL HIGH (ref 70–99)
Potassium: 3.5 mmol/L (ref 3.5–5.1)
Sodium: 139 mmol/L (ref 135–145)
Total Bilirubin: 0.6 mg/dL (ref 0.3–1.2)
Total Protein: 6.7 g/dL (ref 6.5–8.1)

## 2019-10-14 LAB — APTT
aPTT: 33 seconds (ref 24–36)
aPTT: 83 seconds — ABNORMAL HIGH (ref 24–36)

## 2019-10-14 LAB — PROTIME-INR
INR: 3.4 — ABNORMAL HIGH (ref 0.8–1.2)
Prothrombin Time: 34.6 seconds — ABNORMAL HIGH (ref 11.4–15.2)

## 2019-10-14 LAB — BASIC METABOLIC PANEL
Anion gap: 8 (ref 5–15)
BUN: 17 mg/dL (ref 8–23)
CO2: 20 mmol/L — ABNORMAL LOW (ref 22–32)
Calcium: 8.1 mg/dL — ABNORMAL LOW (ref 8.9–10.3)
Chloride: 117 mmol/L — ABNORMAL HIGH (ref 98–111)
Creatinine, Ser: 0.91 mg/dL (ref 0.44–1.00)
GFR calc Af Amer: >60 mL/min (ref >60)
GFR calc non Af Amer: 54 mL/min — ABNORMAL LOW (ref >60)
Glucose, Bld: 130 mg/dL — ABNORMAL HIGH (ref 70–99)
Potassium: 3.9 mmol/L (ref 3.5–5.1)
Sodium: 145 mmol/L (ref 135–145)

## 2019-10-14 LAB — HEPARIN LEVEL (UNFRACTIONATED): Heparin Unfractionated: 1.14 IU/mL — ABNORMAL HIGH (ref 0.30–0.70)

## 2019-10-14 LAB — LACTIC ACID, PLASMA
Lactic Acid, Venous: 2 mmol/L (ref 0.5–1.9)
Lactic Acid, Venous: 2.2 mmol/L (ref 0.5–1.9)

## 2019-10-14 LAB — SARS CORONAVIRUS 2 (TAT 6-24 HRS): SARS Coronavirus 2: NEGATIVE

## 2019-10-14 LAB — LIPASE, BLOOD: Lipase: 30 U/L (ref 11–51)

## 2019-10-14 LAB — POC SARS CORONAVIRUS 2 AG -  ED: SARS Coronavirus 2 Ag: NEGATIVE

## 2019-10-14 MED ORDER — LIDOCAINE VISCOUS HCL 2 % MT SOLN
15.0000 mL | Freq: Once | OROMUCOSAL | Status: AC
  Administered 2019-10-14: 04:00:00 15 mL via OROMUCOSAL
  Filled 2019-10-14: qty 15

## 2019-10-14 MED ORDER — SODIUM CHLORIDE 0.9 % IV SOLN: INTRAVENOUS | Status: DC

## 2019-10-14 MED ORDER — LEVOTHYROXINE SODIUM 100 MCG/5ML IV SOLN
40.0000 ug | Freq: Every day | INTRAVENOUS | Status: DC
  Administered 2019-10-14: 10:00:00 40 ug via INTRAVENOUS
  Filled 2019-10-14 (×3): qty 5

## 2019-10-14 MED ORDER — ONDANSETRON HCL 4 MG PO TABS: 4.0000 mg | ORAL_TABLET | Freq: Four times a day (QID) | ORAL | Status: DC | PRN

## 2019-10-14 MED ORDER — METOPROLOL TARTRATE 5 MG/5ML IV SOLN
2.5000 mg | Freq: Three times a day (TID) | INTRAVENOUS | Status: DC
  Administered 2019-10-14 – 2019-10-15 (×4): 2.5 mg via INTRAVENOUS
  Filled 2019-10-14 (×5): qty 5

## 2019-10-14 MED ORDER — ONDANSETRON HCL 4 MG/2ML IJ SOLN: 4.0000 mg | Freq: Four times a day (QID) | INTRAMUSCULAR | Status: DC | PRN

## 2019-10-14 MED ORDER — SODIUM CHLORIDE 0.9% FLUSH
3.0000 mL | Freq: Two times a day (BID) | INTRAVENOUS | Status: DC
  Administered 2019-10-14 (×3): 3 mL via INTRAVENOUS

## 2019-10-14 MED ORDER — FENTANYL CITRATE (PF) 100 MCG/2ML IJ SOLN: 25.0000 ug | INTRAMUSCULAR | Status: DC | PRN

## 2019-10-14 MED ORDER — SODIUM CHLORIDE 0.9 % IV SOLN: INTRAVENOUS | Status: AC

## 2019-10-14 MED ORDER — IOHEXOL 300 MG/ML  SOLN
100.0000 mL | Freq: Once | INTRAMUSCULAR | Status: AC | PRN
  Administered 2019-10-14: 01:00:00 100 mL via INTRAVENOUS

## 2019-10-14 MED ORDER — HEPARIN (PORCINE) 25000 UT/250ML-% IV SOLN
500.0000 [IU]/h | INTRAVENOUS | Status: DC
  Administered 2019-10-14: 11:00:00 500 [IU]/h via INTRAVENOUS
  Filled 2019-10-14: qty 250

## 2019-10-14 NOTE — H&P (Signed)
History and Physical    Teresa Gonzalez [id number] DOB: 11-07-1921 DOA: 10/13/2019  PCP: Shon Baton, MD   Patient coming from: Home   Chief Complaint: chest pain, epigastric pain, N/V/D, cough, fever   HPI: Teresa Gonzalez is a 86 y.o. female with medical history significant for paroxysmal atrial fibrillation on Xarelto, hypothyroidism, history of uterine cancer status post hysterectomy and radiation therapy, and history of small bowel obstruction in September 2020 that resolved with conservative management only, now presenting to the emergency department for evaluation of chest pain, abdominal pain, nausea, vomiting, diarrhea, and fever. Patient developed diffuse chest tightness approximately 12 hours ago, and has also been experiencing diffuse abdominal pain with nausea, vomiting, and diarrhea. Patient was reportedly exposed to someone with COVID-19 on Christmas Eve, has had a cough, denies shortness of breath, and was noted to have a temperature of 100.8 F with EMS that was treated with Tylenol prior to arrival.  She was also given full-dose ASA and 1 dose of nitroglycerin prior to arrival in the ED.    ED Course: Upon arrival to the ED, patient is found to be afebrile, saturating mid 90s on room air, normal respirations, normal heart rate, and stable blood pressure.  EKG features sinus rhythm with PAC and chronic LBBB.  Chest x-ray with low lung volumes and mild bibasilar atelectasis, but no evidence for pneumonia.  CT of the abdomen and pelvis demonstrates marked fluid dilation of the stomach with distended large hiatal hernia and multiple dilated loops of small bowel with transition in the pelvis, consistent with recurrent SBO.  Chemistry panel with bicarbonate of 19 and glucose 190.  CBC with mild leukocytosis.  Lactic acid 2.0.  High-sensitivity troponin normal.  Patient was treated with fentanyl and Zofran in the emergency department.  She was also started on IV fluids, blood and urine cultures  were ordered, NG tube was ordered, Covid antigen test was negative, and confirmatory PCR test is pending.  Review of Systems:  All other systems reviewed and apart from HPI, are negative.  Past Medical History:  Diagnosis Date  . Atrial fibrillation ([place])   . Depression   . Depression   . Hypothyroidism   . Insomnia   . Left carpal tunnel syndrome    By nerve conduction study  . Peripheral neuropathy   . S/P small bowel resection    with Diarrhea  . SBO (small bowel obstruction) ([place]) 06/2019  . Status post radiation therapy   . Uterine cancer ([place])   . Vitamin D deficiency     Past Surgical History:  Procedure Laterality Date  . ABDOMINAL HYSTERECTOMY    . CATARACT EXTRACTION Bilateral      reports that she has never smoked. She has never used smokeless tobacco. She reports that she does not drink alcohol or use drugs.  Allergies  Allergen Reactions  . Codeine Itching  . Penicillins Swelling    Family History  Problem Relation Age of Onset  . Stroke Mother   . Aneurysm Father   . Diabetes Brother      Prior to Admission medications   Medication Sig Start Date End Date Taking? Authorizing Provider  Calcium Carbonate (CALTRATE 600 PO) Take 600 mg by mouth 3 (three) times daily.     [provider]  Cholecalciferol (VITAMIN D3) 1000 units CAPS Take 1,000 Units by mouth daily.    [provider]  furosemide (LASIX) 20 MG tablet Take 20 mg by mouth daily.  [provider]  gabapentin (NEURONTIN) 100 MG capsule Take 100 mg by mouth 2 (two) times daily as needed (nerve pain).    [provider]  loperamide (IMODIUM) 2 MG capsule Take 1 capsule (2 mg total) by mouth 5 (five) times daily as needed for diarrhea or loose stools. 10/23/13   Shon Baton, MD  LORazepam (ATIVAN) 0.5 MG tablet Take 0.5 mg by mouth daily as needed for anxiety. 01/26/17   [provider]  magnesium oxide (MAG-OX) 400 (241.3 Mg) MG tablet Take 1 tablet  (400 mg total) by mouth daily. 04/01/17   Nita Sells, MD  meclizine (ANTIVERT) 12.5 MG tablet Take 12.5 mg by mouth 3 (three) times daily as needed for dizziness.    [provider]  methocarbamol (ROBAXIN) 500 MG tablet Take 500 mg by mouth every 8 (eight) hours as needed for muscle spasms.    [provider]  metoprolol tartrate (LOPRESSOR) 25 MG tablet Take 1 tablet (25 mg total) by mouth 2 (two) times daily. 08/06/15   Cherene Altes, MD  Multiple Vitamins-Minerals (MULTIVITAMIN WITH MINERALS) tablet Take 1 tablet by mouth daily.    [provider]  potassium chloride SA (K-DUR,KLOR-CON) 20 MEQ tablet Take 2 tablets (40 mEq total) by mouth daily. Patient taking differently: Take 20 mEq by mouth 2 (two) times daily.  04/01/17   Nita Sells, MD  raloxifene (EVISTA) 60 MG tablet Take 60 mg by mouth daily.    [provider]  rivaroxaban (XARELTO) 10 MG TABS tablet Take 10 mg by mouth daily.    [provider]  SYNTHROID 50 MCG tablet Take 50 mcg by mouth daily. 07/01/15   [provider]  VITAMIN B1-B12 IJ Inject 1 mL into the skin every 21 ( twenty-one) days.     [provider]  Vitamin D, Ergocalciferol, (DRISDOL) 50000 UNITS CAPS Take 50,000 Units by mouth See admin instructions. Takes on Monday, Wednesday, Friday    [provider]    Physical Exam: Vitals:   10/13/19 2345 10/14/19 0000 10/14/19 0015 10/14/19 0030  BP: 129/74 123/69 110/66 122/70  Pulse: 86 84 83 82  Resp: (!) 23 18 14 16   Temp:      TempSrc:      SpO2: 96% 96% 93% 94%    Constitutional: NAD, calm  Eyes: PERTLA, lids and conjunctivae normal ENMT: Mucous membranes are moist. Posterior pharynx clear of any exudate or lesions.   Neck: normal, supple, no masses, no thyromegaly Respiratory:  no wheezing, no crackles. Normal respiratory effort. No accessory muscle use.  Cardiovascular: S1 & S2 heard, regular rate and rhythm. No  extremity edema.   Abdomen: Generalized tenderness, particularly epigastrium. No rebound pain or guarding.   Musculoskeletal: no clubbing / cyanosis. No joint deformity upper and lower extremities.   Skin: ecchymosis to left forearm. Warm, dry, well-perfused. Neurologic: No facial asymmetry. Sensation intact. Moving all extremities.  Psychiatric: Alert, oriented to person, place, and situation. Appropriate throughout interview and history. Very pleasant.   Labs on Admission: I have personally reviewed following labs and imaging studies  CBC: Recent Labs  Lab 10/13/19 2330  WBC 10.6*  NEUTROABS 9.1*  HGB 13.4  HCT 41.2  MCV 92.4  PLT [id number]   Basic Metabolic Panel: Recent Labs  Lab 10/13/19 2330  NA 139  K 3.5  CL 109  CO2 19*  GLUCOSE 190*  BUN 13  CREATININE 0.93  CALCIUM 9.5   GFR: CrCl cannot be calculated (Unknown  ideal weight.). Liver Function Tests: Recent Labs  Lab 10/13/19 2330  AST 22  ALT 16  ALKPHOS 55  BILITOT 0.6  PROT 6.7  ALBUMIN 3.7   Recent Labs  Lab 10/13/19 2330  LIPASE 30   No results for input(s): AMMONIA in the last 168 hours. Coagulation Profile: No results for input(s): INR, PROTIME in the last 168 hours. Cardiac Enzymes: No results for input(s): CKTOTAL, CKMB, CKMBINDEX, TROPONINI in the last 168 hours. BNP (last 3 results) No results for input(s): PROBNP in the last 8760 hours. HbA1C: No results for input(s): HGBA1C in the last 72 hours. CBG: No results for input(s): GLUCAP in the last 168 hours. Lipid Profile: No results for input(s): CHOL, HDL, LDLCALC, TRIG, CHOLHDL, LDLDIRECT in the last 72 hours. Thyroid Function Tests: No results for input(s): TSH, T4TOTAL, FREET4, T3FREE, THYROIDAB in the last 72 hours. Anemia Panel: No results for input(s): VITAMINB12, FOLATE, FERRITIN, TIBC, IRON, RETICCTPCT in the last 72 hours. Urine analysis:    Component Value Date/Time   COLORURINE YELLOW 03/30/2017 2051   APPEARANCEUR HAZY  (A) 03/30/2017 2051   LABSPEC 1.011 03/30/2017 2051   PHURINE 5.0 03/30/2017 2051   GLUCOSEU NEGATIVE 03/30/2017 2051   HGBUR NEGATIVE 03/30/2017 2051   BILIRUBINUR NEGATIVE 03/30/2017 2051   KETONESUR 5 (A) 03/30/2017 2051   PROTEINUR NEGATIVE 03/30/2017 2051   UROBILINOGEN 0.2 08/01/2015 0125   NITRITE NEGATIVE 03/30/2017 2051   LEUKOCYTESUR TRACE (A) 03/30/2017 2051   Sepsis Labs: @LABRCNTIP (procalcitonin:4,lacticidven:4) )No results found for this or any previous visit (from the past 240 hour(s)).   Radiological Exams on Admission: CT ABDOMEN PELVIS W CONTRAST  Result Date: 10/14/2019 CLINICAL DATA:  Nausea vomiting diarrhea and fever EXAM: CT ABDOMEN AND PELVIS WITH CONTRAST TECHNIQUE: Multidetector CT imaging of the abdomen and pelvis was performed using the standard protocol following bolus administration of intravenous contrast. CONTRAST:  131mL OMNIPAQUE IOHEXOL 300 MG/ML  SOLN COMPARISON:  CT 06/27/2019 FINDINGS: Lower chest: Lung bases demonstrate no acute consolidation or pleural effusion. Borderline cardiomegaly. Large fluid-filled hiatal hernia. Hepatobiliary: No focal liver abnormality is seen. No calcified gallstone. Persistent extrahepatic biliary dilatation without calcified stone. Pancreas: Atrophic.  No inflammatory change. Spleen: Normal in size without focal abnormality. Adrenals/Urinary Tract: Adrenal glands are normal. There are cysts within the bilateral kidneys. Subcentimeter hypodensities, too small to further characterize. Slightly hyperdense lesions within the upper pole of the right kidney and the midpole of the left kidney with similar density values on delayed images, possible hemorrhagic or proteinaceous cysts. The bladder is slightly thick walled. Stomach/Bowel: Marked fluid distention of the stomach. Multiple dilated fluid-filled small bowel loops with gradual transition to more normal caliber distal small bowel in the pelvis suggesting bowel obstruction. Small  bowel is dilated up to 3.7 cm. Postsurgical changes in the right lower quadrant. No colon wall thickening. Diverticular disease of the colon. Vascular/Lymphatic: Extensive aortic atherosclerosis. No aneurysm. No significantly enlarged lymph nodes Reproductive: Status post hysterectomy.  No adnexal mass Other: No free air or free fluid. Musculoskeletal: Bones appear osteopenic. Chronic compression fractures at L5 and T11. 6 mm retropulsion upper aspect of L5 vertebral body similar compared to prior. IMPRESSION: 1. Marked fluid dilatation of the stomach with fluid distended large hiatal hernia. Multiple dilated fluid-filled loops of small bowel with transition to more normal caliber small bowel in the pelvis, consistent with small bowel obstruction. Consider NG tube decompression of the stomach. 2. Diverticular disease of the colon without acute inflammatory change 3. Complex bilateral renal  cysts. 4. Chronic compression fractures at L5 and T11 Electronically Signed   By: Donavan Foil M.D.   On: 10/14/2019 01:24   DG Chest Portable 1 View  Result Date: 10/14/2019 CLINICAL DATA:  Fever and chest pain. EXAM: PORTABLE CHEST 1 VIEW COMPARISON:  06/27/2019 FINDINGS: Low lung volumes, similar to prior exam. Minimal subsegmental atelectasis at the bases. No evidence of pneumonia. Normal heart size. Hiatal hernia. No pulmonary edema, pleural effusion, or pneumothorax. Calcified granuloma in the right upper lung. Scoliotic curvature of the spine. Bones diffusely under mineralized. No acute osseous abnormalities are seen. IMPRESSION: Low lung volumes with mild bibasilar atelectasis. No evidence of pneumonia. Electronically Signed   By: Keith Rake M.D.   On: 10/14/2019 00:35    EKG: Independently reviewed. Sinus rhythm, PAC, chronic LBBB.   Assessment/Plan   1. SBO  - Presents with pain in chest and abdomen, nausea, vomiting, reports same as SBO in September but pain not as severe this time  - CT confirms  recurrent SBO  - NGT decompression, NPO, pain-control, maintenance IVF, KUB in am   2. Atrial fibrillation  - CHADS-VASc at least 3 (age x2, gender) - Managed with Xarelto and metoprolol at home  - NPO, may not be able to take PO for a while so IV heparin will be started and IV metoprolol used for rate-control    3. Hypothyroidism  - Continue Synthroid    4. Chest pain  - Resolved pta, was associated with abd pain and N/V, and likely related to SBO rather than ACS  - EKG with chronic LBBB, HS troponin normal x1, and CXR with no acute findings  - Continue cardiac monitoring, check delta troponin    5. Cough, fever, COVID exposure  - Patient was febrile with EMS and treated with APAP pta, had exposure to [place] on 10/04/19, and reports cough without SOB  - COVID Ag test negative, CXR without appreciable infiltrates, no tachypnea or hypoxia  - Maintain isolation pending COVID pcr test    DVT prophylaxis: IV heparin; Xarelto pta   Code Status: DNR, confirmed with patient  Family Communication: Discussed with patient  Consults called: none  Admission status: Inpatient     Vianne Bulls, MD [facility] Pager [phone]  If 7PM-7AM, please contact night-coverage [email or web address] Password [facility]  10/14/2019, 2:38 AM

## 2019-10-14 NOTE — ED Notes (Signed)
ED TO INPATIENT HANDOFF REPORT  ED Nurse Name and Phone #: Kendle Erker [phone]  S Name/Age/Gender Teresa Gonzalez 86 y.o. female Room/Bed: 033C/033C  Code Status   Code Status: DNR  Home/SNF/Other Home Patient oriented to: self, place, time and situation Is this baseline? Yes   Triage Complete: Triage complete  Chief Complaint SBO (small bowel obstruction) ([place]) [B28.413]  Triage Note Pt bib gcems after c/o non radiating chest pain. Pt endorses SOB, N/V/D. Pt had emesis episode w/ EMS. Code STEMI called by EMS and cancelled on arrival. Pt received 1 nitro, 314m of aspirin with improved chest pain. Currently pt denies chest pain. Pt had recent exposure to covid. Pt had temp of 100.8 with EMS and received 10060mtylenol. EMS 12 lead showed BBB. EMS VSS.     Allergies Allergies  Allergen Reactions  . Codeine Itching  . Penicillins Swelling    Level of Care/Admitting Diagnosis ED Disposition    ED Disposition Condition Comment   [place]  [id number]  Level of Care: Telemetry Cardiac [[id number]]  Covid Evaluation: Confirmed COVID Negative  Diagnosis: SBO (small bowel obstruction) ([facility][[id number]]Admitting Physician: SANita [phone] Physician: SANita [phone] length of stay: 3 - 4 days  Certification:: I certify this patient will need inpatient services for at least 2 midnights       B Medical/Surgery History Past Medical History:  Diagnosis Date  . Atrial fibrillation ([facility]  . Depression   . Depression   . Hypothyroidism   . Insomnia   . Left carpal tunnel syndrome    By nerve conduction study  . Peripheral neuropathy   . S/P small bowel resection    with Diarrhea  . SBO (small bowel obstruction) ([facility]/2020  . Status post radiation therapy   . Uterine cancer ([facility]  . Vitamin D deficiency    Past Surgical History:  Procedure Laterality Date  . ABDOMINAL HYSTERECTOMY    .  CATARACT EXTRACTION Bilateral      A IV Location/Drains/Wounds Patient Lines/Drains/Airways Status   Active Line/Drains/Airways    Name:   Placement date:   Placement time:   Site:   Days:   Peripheral IV 10/13/19 Left Antecubital   10/13/19    2313    Antecubital   1   Peripheral IV 10/13/19 Left Forearm   10/13/19    2313    Forearm   1          Intake/Output Last 24 hours No intake or output data in the 24 hours ending 10/14/19 1743  Labs/Imaging Results for orders placed or performed during the hospital encounter of 10/13/19 (from the past 48 hour(s))  CBC with Differential     Status: Abnormal   Collection Time: 10/13/19 11:30 PM  Result Value Ref Range   WBC 10.6 (H) 4.0 - 10.5 K/uL   RBC 4.46 3.87 - 5.11 MIL/uL   Hemoglobin 13.4 12.0 - 15.0 g/dL   HCT 41.2 36.0 - 46.0 %   MCV 92.4 80.0 - 100.0 fL   MCH 30.0 26.0 - 34.0 pg   MCHC 32.5 30.0 - 36.0 g/dL   RDW 14.6 11.5 - 15.5 %   Platelets 355 150 - 400 K/uL   nRBC 0.0 0.0 - 0.2 %   Neutrophils Relative % 86 %   Neutro Abs 9.1 (H) 1.7 - 7.7 K/uL   Lymphocytes Relative 10 %   Lymphs Abs 1.1 0.7 -  4.0 K/uL   Monocytes Relative 3 %   Monocytes Absolute 0.4 0.1 - 1.0 K/uL   Eosinophils Relative 0 %   Eosinophils Absolute 0.0 0.0 - 0.5 K/uL   Basophils Relative 0 %   Basophils Absolute 0.0 0.0 - 0.1 K/uL   Immature Granulocytes 1 %   Abs Immature Granulocytes 0.06 0.00 - 0.07 K/uL    Comment: Performed at [facility] [address] [place], [place] [address]  Comprehensive metabolic panel     Status: Abnormal   Collection Time: 10/13/19 11:30 PM  Result Value Ref Range   Sodium 139 135 - 145 mmol/L   Potassium 3.5 3.5 - 5.1 mmol/L   Chloride 109 98 - 111 mmol/L   CO2 19 (L) 22 - 32 mmol/L   Glucose, Bld 190 (H) 70 - 99 mg/dL   BUN 13 8 - 23 mg/dL   Creatinine, Ser 0.93 0.44 - 1.00 mg/dL   Calcium 9.5 8.9 - 10.3 mg/dL   Total Protein 6.7 6.5 - 8.1 g/dL   Albumin 3.7 3.5 - 5.0 g/dL   AST 22 15 - 41 U/L    ALT 16 0 - 44 U/L   Alkaline Phosphatase 55 38 - 126 U/L   Total Bilirubin 0.6 0.3 - 1.2 mg/dL   GFR calc non Af Amer 52 (L) >60 mL/min   GFR calc Af Amer >60 >60 mL/min   Anion gap 11 5 - 15    Comment: Performed at [facility] [address], [place], [place] [address]  Lipase, blood     Status: None   Collection Time: 10/13/19 11:30 PM  Result Value Ref Range   Lipase 30 11 - 51 U/L    Comment: Performed at [facility], [place] [address], [place], [place] [address]  Troponin I (High Sensitivity)     Status: None   Collection Time: 10/13/19 11:30 PM  Result Value Ref Range   Troponin I (High Sensitivity) 11 <18 ng/L    Comment: (NOTE) Elevated high sensitivity troponin I (hsTnI) values and significant  changes across serial measurements may suggest ACS but many other  chronic and acute conditions are known to elevate hsTnI results.  Refer to the "Links" section for chest pain algorithms and additional  guidance. Performed at [facility], [place] [address] [place], [place] [address]   Lactic acid, plasma     Status: Abnormal   Collection Time: 10/13/19 11:49 PM  Result Value Ref Range   Lactic Acid, Venous 2.0 (HH) 0.5 - 1.9 mmol/L    Comment: CRITICAL RESULT CALLED TO, READ BACK BY AND VERIFIED WITH: DENNIS A,RN 10/14/19 0013 WAYK Performed at [facility], [place] [address], [place], [place] [address]   Blood culture (routine x 2)     Status: None (Preliminary result)   Collection Time: 10/13/19 11:50 PM   Specimen: BLOOD  Result Value Ref Range   Specimen Description BLOOD RIGHT ARM    Special Requests      BOTTLES DRAWN AEROBIC AND ANAEROBIC Blood Culture results may not be optimal due to an inadequate volume of blood received in culture bottles   Culture      NO GROWTH < 12 HOURS Performed at [facility] [address] [place], [place] [address]    Report Status PENDING   Blood culture (routine x 2)     Status: None (Preliminary  result)   Collection Time: 10/13/19 11:55 PM  Specimen: BLOOD  Result Value Ref Range   Specimen Description BLOOD RIGHT FOREARM    Special Requests      BOTTLES DRAWN AEROBIC AND ANAEROBIC Blood Culture results may not be optimal due to an inadequate volume of blood received in culture bottles   Culture      NO GROWTH < 12 HOURS Performed at [facility] [address], [place], [place] [address]    Report Status PENDING   POC SARS Coronavirus 2 Ag-ED - Nasal Swab (BD Veritor Kit)     Status: None   Collection Time: 10/14/19 12:19 AM  Result Value Ref Range   SARS Coronavirus 2 Ag NEGATIVE NEGATIVE    Comment: (NOTE) SARS-CoV-2 antigen NOT DETECTED.  Negative results are presumptive.  Negative results do not preclude SARS-CoV-2 infection and should not be used as the sole basis for treatment or other patient management decisions, including infection  control decisions, particularly in the presence of clinical signs and  symptoms consistent with COVID-19, or in those who have been in contact with the virus.  Negative results must be combined with clinical observations, patient history, and epidemiological information. The expected result is Negative. Fact Sheet for Patients: PodPark.tn Fact Sheet for Healthcare Providers: GiftContent.is This test is not yet approved or cleared by the [place] FDA and  has been authorized for detection and/or diagnosis of SARS-CoV-2 by FDA under an Emergency Use Authorization (EUA).  This EUA will remain in effect (meaning this test can be used) for the duration of  the COVID-19 de claration under Section 564(b)(1) of the Act, 21 U.S.C. section 360bbb-3(b)(1), unless the authorization is terminated or revoked sooner.   SARS CORONAVIRUS 2 (TAT 6-24 HRS) Nasopharyngeal     Status: None   Collection Time: 10/14/19  2:07 AM   Specimen: Nasopharyngeal  Result Value Ref Range    SARS Coronavirus 2 NEGATIVE NEGATIVE    Comment: (NOTE) SARS-CoV-2 target nucleic acids are NOT DETECTED. The SARS-CoV-2 RNA is generally detectable in upper and lower respiratory specimens during the acute phase of infection. Negative results do not preclude SARS-CoV-2 infection, do not rule out co-infections with other pathogens, and should not be used as the sole basis for treatment or other patient management decisions. Negative results must be combined with clinical observations, patient history, and epidemiological information. The expected result is Negative. Fact Sheet for Patients: SugarRoll.be Fact Sheet for Healthcare Providers: https://www.woods-mathews.com/ This test is not yet approved or cleared by the [place] FDA and  has been authorized for detection and/or diagnosis of SARS-CoV-2 by FDA under an Emergency Use Authorization (EUA). This EUA will remain  in effect (meaning this test can be used) for the duration of the COVID-19 declaration under Section 56 4(b)(1) of the Act, 21 U.S.C. section 360bbb-3(b)(1), unless the authorization is terminated or revoked sooner. Performed at [facility], [place] [address], [place], [place] [address]   Troponin I (High Sensitivity)     Status: None   Collection Time: 10/14/19  2:26 AM  Result Value Ref Range   Troponin I (High Sensitivity) 14 <18 ng/L    Comment: (NOTE) Elevated high sensitivity troponin I (hsTnI) values and significant  changes across serial measurements may suggest ACS but many other  chronic and acute conditions are known to elevate hsTnI results.  Refer to the "Links" section for chest pain algorithms and additional  guidance. Performed at [facility], [place] [address], [place], [place] [address]  Lactic acid, plasma     Status: Abnormal   Collection Time: 10/14/19  2:27 AM  Result Value Ref Range   Lactic Acid, Venous 2.2 (HH) 0.5 - 1.9 mmol/L     Comment: CRITICAL VALUE NOTED.  VALUE IS CONSISTENT WITH PREVIOUSLY REPORTED AND CALLED VALUE. Performed at [facility], [place] [address], [place], [place] [address]   Urinalysis, Routine w reflex microscopic     Status: Abnormal   Collection Time: 10/14/19  2:31 AM  Result Value Ref Range   Color, Urine YELLOW YELLOW   APPearance CLOUDY (A) CLEAR   Specific Gravity, Urine >1.046 (H) 1.005 - 1.030   pH 5.0 5.0 - 8.0   Glucose, UA NEGATIVE NEGATIVE mg/dL   Hgb urine dipstick SMALL (A) NEGATIVE   Bilirubin Urine NEGATIVE NEGATIVE   Ketones, ur NEGATIVE NEGATIVE mg/dL   Protein, ur 30 (A) NEGATIVE mg/dL   Nitrite NEGATIVE NEGATIVE   Leukocytes,Ua LARGE (A) NEGATIVE   RBC / HPF 6-10 0 - 5 RBC/hpf   WBC, UA >50 (H) 0 - 5 WBC/hpf   Bacteria, UA MANY (A) NONE SEEN   WBC Clumps PRESENT     Comment: Performed at [facility], [address], [place], [place] [address]  APTT     Status: None   Collection Time: 10/14/19  6:27 AM  Result Value Ref Range   aPTT 33 24 - 36 seconds    Comment: Performed at [facility] [address], [place], [place] [address]  Protime-INR     Status: Abnormal   Collection Time: 10/14/19  6:27 AM  Result Value Ref Range   Prothrombin Time 34.6 (H) 11.4 - 15.2 seconds   INR 3.4 (H) 0.8 - 1.2    Comment: (NOTE) INR goal varies based on device and disease states. Performed at [facility], [place] [address] [place], [place] [address]   Basic metabolic panel     Status: Abnormal   Collection Time: 10/14/19  6:27 AM  Result Value Ref Range   Sodium 145 135 - 145 mmol/L   Potassium 3.9 3.5 - 5.1 mmol/L   Chloride 117 (H) 98 - 111 mmol/L   CO2 20 (L) 22 - 32 mmol/L   Glucose, Bld 130 (H) 70 - 99 mg/dL   BUN 17 8 - 23 mg/dL   Creatinine, Ser 0.91 0.44 - 1.00 mg/dL   Calcium 8.1 (L) 8.9 - 10.3 mg/dL   GFR calc non Af Amer 54 (L) >60 mL/min   GFR calc Af Amer >60 >60 mL/min   Anion gap 8 5 - 15    Comment: Performed at [facility] [address], [place], [place] [address]  CBC WITH DIFFERENTIAL     Status: Abnormal   Collection Time: 10/14/19  6:27 AM  Result Value Ref Range   WBC 10.8 (H) 4.0 - 10.5 K/uL   RBC 3.60 (L) 3.87 - 5.11 MIL/uL   Hemoglobin 10.6 (L) 12.0 - 15.0 g/dL   HCT 33.8 (L) 36.0 - 46.0 %   MCV 93.9 80.0 - 100.0 fL   MCH 29.4 26.0 - 34.0 pg   MCHC 31.4 30.0 - 36.0 g/dL   RDW 14.8 11.5 - 15.5 %   Platelets 292 150 - 400 K/uL   nRBC 0.0 0.0 - 0.2 %   Neutrophils Relative % 72 %   Neutro Abs 7.9 (H) 1.7 - 7.7 K/uL   Lymphocytes Relative 19 %  Lymphs Abs 2.0 0.7 - 4.0 K/uL   Monocytes Relative 8 %   Monocytes Absolute 0.9 0.1 - 1.0 K/uL   Eosinophils Relative 0 %   Eosinophils Absolute 0.0 0.0 - 0.5 K/uL   Basophils Relative 0 %   Basophils Absolute 0.0 0.0 - 0.1 K/uL   Immature Granulocytes 1 %   Abs Immature Granulocytes 0.05 0.00 - 0.07 K/uL    Comment: Performed at [facility], [place] [address] [place], [place] [address]  Heparin level (unfractionated)     Status: Abnormal   Collection Time: 10/14/19  6:27 AM  Result Value Ref Range   Heparin Unfractionated 1.14 (H) 0.30 - 0.70 IU/mL    Comment: RESULTS CONFIRMED BY MANUAL DILUTION (NOTE) If heparin results are below expected values, and patient dosage has  been confirmed, suggest follow up testing of antithrombin III levels. Performed at [facility], [place] [address], [place], [place] [address]    DG Abdomen 1 View  Result Date: 10/14/2019 CLINICAL DATA:  NG tube placement. EXAM: ABDOMEN - 1 VIEW COMPARISON:  Earlier this day. FINDINGS: Tip and side port of the enteric tube below the diaphragm in the stomach. Excreted IV contrast within both renal collecting systems. Low lung volumes. IMPRESSION: Tip and side port of the enteric tube below the diaphragm in the stomach. Electronically Signed   By: Keith Rake M.D.   On: 10/14/2019 04:40   DG Abd 1 View  Result Date: 10/14/2019 CLINICAL DATA:  Small bowel  obstruction. EXAM: ABDOMEN - 1 VIEW COMPARISON:  CT 2 hours prior FINDINGS: No enteric tube is visualized. Dilated fluid-filled stomach and proximal small bowel on CT are not well visualized radiographically. Mild gaseous distention of small bowel in the pelvis to 3.2 cm. Excreted IV contrast in the renal collecting systems and urinary bladder. No evidence of free air on this portable supine view. IMPRESSION: Mild gaseous distention of pelvic small bowel to 3.2 cm. Please note majority of the dilated bowel as well as marked gastric distention on CT is fluid-filled and not well seen radiographically. Small bowel obstruction better appreciated on CT 2 hours ago. Electronically Signed   By: Keith Rake M.D.   On: 10/14/2019 03:05   CT ABDOMEN PELVIS W CONTRAST  Result Date: 10/14/2019 CLINICAL DATA:  Nausea vomiting diarrhea and fever EXAM: CT ABDOMEN AND PELVIS WITH CONTRAST TECHNIQUE: Multidetector CT imaging of the abdomen and pelvis was performed using the standard protocol following bolus administration of intravenous contrast. CONTRAST:  1108m OMNIPAQUE IOHEXOL 300 MG/ML  SOLN COMPARISON:  CT 06/27/2019 FINDINGS: Lower chest: Lung bases demonstrate no acute consolidation or pleural effusion. Borderline cardiomegaly. Large fluid-filled hiatal hernia. Hepatobiliary: No focal liver abnormality is seen. No calcified gallstone. Persistent extrahepatic biliary dilatation without calcified stone. Pancreas: Atrophic.  No inflammatory change. Spleen: Normal in size without focal abnormality. Adrenals/Urinary Tract: Adrenal glands are normal. There are cysts within the bilateral kidneys. Subcentimeter hypodensities, too small to further characterize. Slightly hyperdense lesions within the upper pole of the right kidney and the midpole of the left kidney with similar density values on delayed images, possible hemorrhagic or proteinaceous cysts. The bladder is slightly thick walled. Stomach/Bowel: Marked fluid  distention of the stomach. Multiple dilated fluid-filled small bowel loops with gradual transition to more normal caliber distal small bowel in the pelvis suggesting bowel obstruction. Small bowel is dilated up to 3.7 cm. Postsurgical changes in the right lower quadrant. No colon wall thickening. Diverticular disease of the  colon. Vascular/Lymphatic: Extensive aortic atherosclerosis. No aneurysm. No significantly enlarged lymph nodes Reproductive: Status post hysterectomy.  No adnexal mass Other: No free air or free fluid. Musculoskeletal: Bones appear osteopenic. Chronic compression fractures at L5 and T11. 6 mm retropulsion upper aspect of L5 vertebral body similar compared to prior. IMPRESSION: 1. Marked fluid dilatation of the stomach with fluid distended large hiatal hernia. Multiple dilated fluid-filled loops of small bowel with transition to more normal caliber small bowel in the pelvis, consistent with small bowel obstruction. Consider NG tube decompression of the stomach. 2. Diverticular disease of the colon without acute inflammatory change 3. Complex bilateral renal cysts. 4. Chronic compression fractures at L5 and T11 Electronically Signed   By: Donavan Foil M.D.   On: 10/14/2019 01:24   DG Chest Portable 1 View  Result Date: 10/14/2019 CLINICAL DATA:  Fever and chest pain. EXAM: PORTABLE CHEST 1 VIEW COMPARISON:  06/27/2019 FINDINGS: Low lung volumes, similar to prior exam. Minimal subsegmental atelectasis at the bases. No evidence of pneumonia. Normal heart size. Hiatal hernia. No pulmonary edema, pleural effusion, or pneumothorax. Calcified granuloma in the right upper lung. Scoliotic curvature of the spine. Bones diffusely under mineralized. No acute osseous abnormalities are seen. IMPRESSION: Low lung volumes with mild bibasilar atelectasis. No evidence of pneumonia. Electronically Signed   By: Keith Rake M.D.   On: 10/14/2019 00:35   DG Abd 2 Views  Result Date: 10/14/2019 CLINICAL  DATA:  Small bowel obstruction. EXAM: ABDOMEN - 2 VIEW COMPARISON:  Same day. FINDINGS: The bowel gas pattern is normal. Distal tip of nasogastric tube is seen in proximal stomach. Atherosclerosis of abdominal aorta is noted. There is no evidence of free air. No radio-opaque calculi or other significant radiographic abnormality is seen. IMPRESSION: No evidence of bowel obstruction or ileus. Aortic atherosclerosis. Distal tip of nasogastric tube is seen in proximal stomach. Electronically Signed   By: Marijo Conception M.D.   On: 10/14/2019 10:55    Pending Labs Unresulted Labs (From admission, onward)    Start     Ordered   10/15/19 0500  APTT  Daily,   R     10/14/19 0650   10/15/19 0500  CBC9  Daily,   R     10/14/19 0650   10/15/19 0500  Heparin level (unfractionated)  Daily,   R     10/14/19 0650   10/15/19 0500  cdc12  Daily,   R     10/14/19 1611   10/14/19 1800  APTT  Once-Timed,   STAT     10/14/19 0650   10/13/19 2328  Urine culture  ONCE - STAT,   STAT     10/13/19 2328          Vitals/Pain Today's Vitals   10/14/19 1147 10/14/19 1200 10/14/19 1400 10/14/19 1600  BP: 139/73 (!) 158/78 (!) 147/73 138/76  Pulse: 70 82 77 84  Resp: (!) 22 19 (!) 22 (!) 23  Temp:      TempSrc:      SpO2: 97% 100% 98% 98%  PainSc:        Isolation Precautions No active isolations  Medications Medications  0.9 %  sodium chloride infusion ( Intravenous Rate/Dose Verify 10/14/19 0758)  sodium chloride flush (NS) 0.9 % injection 3 mL (3 mLs Intravenous Given 10/14/19 1027)  ondansetron (ZOFRAN) tablet 4 mg (has no administration in time range)    Or  ondansetron (ZOFRAN) injection 4 mg (has no administration in time range)  levothyroxine (SYNTHROID, LEVOTHROID) injection 40 mcg (40 mcg Intravenous Given 10/14/19 1026)  metoprolol tartrate (LOPRESSOR) injection 2.5 mg (2.5 mg Intravenous Given 10/14/19 1719)  heparin ADULT infusion 100 units/mL (25000 units/263m sodium chloride 0.45%) (500  Units/hr Intravenous New Bag/Given 10/14/19 1031)  fentaNYL (SUBLIMAZE) injection 50 mcg (50 mcg Intravenous Given 10/13/19 2356)  ondansetron (ZOFRAN) injection 4 mg (4 mg Intravenous Given 10/13/19 2356)  iohexol (OMNIPAQUE) 300 MG/ML solution 100 mL (100 mLs Intravenous Contrast Given 10/14/19 0055)  lidocaine (XYLOCAINE) 2 % viscous mouth solution 15 mL (15 mLs Mouth/Throat Given 10/14/19 0404)    Mobility walks with person assist Moderate fall risk   Focused Assessments Cardiac Assessment Handoff:  Cardiac Rhythm: Normal sinus rhythm Lab Results  Component Value Date   TROPONINI <0.03 11/15/2016   No results found for: DDIMER Does the Patient currently have chest pain? No     R Recommendations: See Admitting Provider Note  Report given to:   Additional Notes:

## 2019-10-14 NOTE — Progress Notes (Signed)
Patient seen and examined and agree with plan of care according to my partner Dr. Myna Hidalgo who saw this patient this morning  86 year old white female Paroxysmal atrial fibrillation [place] score >4: Xarelto PTA moderate mitral regurg hypothyroid, uterine CA status post surgery, depression anxiety Prior history of SBO last admission for the same 06/2019 Admit to [facility] diffuse chest tightness abdominal pain nausea CT abdomen pelvis shows marked fluid dilation distended large hiatal hernia multiple small bowel loops-NG tube placed Confirmatory coronavirus 19 test is negative  She tells me that she has had multiole stools since this am and last pm She doenst have n now She isnt in pain no fever  P Get DG 2 vw and review if SBO is resolved Heparin being started now Continue saline Will ask PT to eval--patient lives at home alone but daughter lives right behind her  Verneita Griffes, MD [facility] 10:12 AM

## 2019-10-14 NOTE — Progress Notes (Signed)
[place] for Heparin Indication: atrial fibrillation  Allergies  Allergen Reactions  . Codeine Itching  . Penicillins Swelling    Patient Measurements: Height: 5\' 4"  (162.6 cm) Weight: 97 lb 11.2 oz (44.3 kg) IBW/kg (Calculated) : 54.7  Vital Signs: Temp: 97.6 F (36.4 C) (01/03 2042) Temp Source: Axillary (01/03 2042) BP: 160/81 (01/03 2042) Pulse Rate: 89 (01/03 2042)  Labs: Recent Labs    10/13/19 2330 10/14/19 0226 10/14/19 0627 10/14/19 2127  HGB 13.4  --  10.6*  --   HCT 41.2  --  33.8*  --   PLT 355  --  292  --   APTT  --   --  33 83*  LABPROT  --   --  34.6*  --   INR  --   --  3.4*  --   HEPARINUNFRC  --   --  1.14*  --   CREATININE 0.93  --  0.91  --   TROPONINIHS 11 14  --   --     Estimated Creatinine Clearance: 25.9 mL/min (by C-G formula based on SCr of 0.91 mg/dL).   Medical History: Past Medical History:  Diagnosis Date  . Atrial fibrillation ([place])   . Depression   . Depression   . Hypothyroidism   . Insomnia   . Left carpal tunnel syndrome    By nerve conduction study  . Peripheral neuropathy   . S/P small bowel resection    with Diarrhea  . SBO (small bowel obstruction) ([place]) 06/2019  . Status post radiation therapy   . Uterine cancer ([place])   . Vitamin D deficiency     Medications:  No current facility-administered medications on file prior to encounter.   Current Outpatient Medications on File Prior to Encounter  Medication Sig Dispense Refill  . apixaban (ELIQUIS) 2.5 MG TABS tablet Take 2.5 mg by mouth 2 (two) times daily.    . Calcium Carbonate (CALTRATE 600 PO) Take 600 mg by mouth 3 (three) times a week.     . Cholecalciferol (VITAMIN D3) 1000 units CAPS Take 1,000 Units by mouth once a week.     . loperamide (IMODIUM) 2 MG capsule Take 1 capsule (2 mg total) by mouth 5 (five) times daily as needed for diarrhea or loose stools. 30 capsule 0  . metoprolol tartrate (LOPRESSOR) 25 MG  tablet Take 1 tablet (25 mg total) by mouth 2 (two) times daily. 60 tablet 0  . mirtazapine (REMERON) 15 MG tablet Take 15 mg by mouth at bedtime.    . Multiple Vitamins-Minerals (MULTIVITAMIN WITH MINERALS) tablet Take 1 tablet by mouth daily.    Marland Kitchen omeprazole (PRILOSEC) 40 MG capsule Take 40 mg by mouth daily.    . raloxifene (EVISTA) 60 MG tablet Take 60 mg by mouth daily.    Marland Kitchen SYNTHROID 50 MCG tablet Take 50 mcg by mouth daily.  2  . VITAMIN B1-B12 IJ Inject 1 mL into the skin every 21 ( twenty-one) days.     . Vitamin D, Ergocalciferol, (DRISDOL) 50000 UNITS CAPS Take 50,000 Units by mouth See admin instructions. Takes on Monday, Wednesday, Friday    . furosemide (LASIX) 20 MG tablet Take 20 mg by mouth daily.    Marland Kitchen gabapentin (NEURONTIN) 100 MG capsule Take 100 mg by mouth 2 (two) times daily as needed (nerve pain).    . LORazepam (ATIVAN) 0.5 MG tablet Take 0.5 mg by mouth daily as needed for anxiety.  0  . magnesium oxide (MAG-OX) 400 (241.3 Mg) MG tablet Take 1 tablet (400 mg total) by mouth daily. 30 tablet 0  . meclizine (ANTIVERT) 12.5 MG tablet Take 12.5 mg by mouth 3 (three) times daily as needed for dizziness.    . methocarbamol (ROBAXIN) 500 MG tablet Take 500 mg by mouth every 8 (eight) hours as needed for muscle spasms.    . potassium chloride SA (K-DUR,KLOR-CON) 20 MEQ tablet Take 2 tablets (40 mEq total) by mouth daily. (Patient taking differently: Take 20 mEq by mouth 2 (two) times daily. ) 30 tablet 0  . rivaroxaban (XARELTO) 10 MG TABS tablet Take 10 mg by mouth daily.       Assessment: 86 y.o. female admitted with SBO, h/o Afib and Xarelto on hold and pharmacy dosing heparin.  Last dose of Xarelto 1/2 -aPTT 83 and at goal   Goal of Therapy:  APTT 66-102 sec Heparin level 0.3-0.7 units/ml Monitor platelets by anticoagulation protocol: Yes   Plan:  -No heparin changes needed -Daily heparin level, aPTT and CBC  Hildred Laser, PharmD Clinical Pharmacist **Pharmacist  phone directory can now be found on [email or web address] ([email or web address]).  Listed under [place].

## 2019-10-14 NOTE — ED Notes (Signed)
Dr. Verlon Au paged to [phone]-Shyniece, RN paged by Levada Dy

## 2019-10-14 NOTE — Plan of Care (Signed)
  Problem: Education: Goal: Knowledge of General Education information will improve Description: Including pain rating scale, medication(s)/side effects and non-pharmacologic comfort measures Outcome: Progressing   Problem: Clinical Measurements: Goal: Ability to maintain clinical measurements within normal limits will improve Outcome: Progressing Goal: Diagnostic test results will improve Outcome: Progressing Goal: Cardiovascular complication will be avoided Outcome: Progressing   Problem: Nutrition: Goal: Adequate nutrition will be maintained Outcome: Progressing   

## 2019-10-14 NOTE — Progress Notes (Signed)
ANTICOAGULATION CONSULT NOTE - Initial Consult  Pharmacy Consult for Heparin Indication: atrial fibrillation  Allergies  Allergen Reactions  . Codeine Itching  . Penicillins Swelling    Patient Measurements:    Vital Signs: Temp: 99.2 F (37.3 C) (01/02 2327) Temp Source: Rectal (01/02 2327) BP: 144/73 (01/03 0630) Pulse Rate: 80 (01/03 0630)  Labs: Recent Labs    10/13/19 2330 10/14/19 0226  HGB 13.4  --   HCT 41.2  --   PLT 355  --   CREATININE 0.93  --   TROPONINIHS 11 14    CrCl cannot be calculated (Unknown ideal weight.).   Medical History: Past Medical History:  Diagnosis Date  . Atrial fibrillation ([place])   . Depression   . Depression   . Hypothyroidism   . Insomnia   . Left carpal tunnel syndrome    By nerve conduction study  . Peripheral neuropathy   . S/P small bowel resection    with Diarrhea  . SBO (small bowel obstruction) ([place]) 06/2019  . Status post radiation therapy   . Uterine cancer ([place])   . Vitamin D deficiency     Medications:  No current facility-administered medications on file prior to encounter.   Current Outpatient Medications on File Prior to Encounter  Medication Sig Dispense Refill  . Calcium Carbonate (CALTRATE 600 PO) Take 600 mg by mouth 3 (three) times daily.     . Cholecalciferol (VITAMIN D3) 1000 units CAPS Take 1,000 Units by mouth daily.    . furosemide (LASIX) 20 MG tablet Take 20 mg by mouth daily.    Marland Kitchen gabapentin (NEURONTIN) 100 MG capsule Take 100 mg by mouth 2 (two) times daily as needed (nerve pain).    Marland Kitchen loperamide (IMODIUM) 2 MG capsule Take 1 capsule (2 mg total) by mouth 5 (five) times daily as needed for diarrhea or loose stools. 30 capsule 0  . LORazepam (ATIVAN) 0.5 MG tablet Take 0.5 mg by mouth daily as needed for anxiety.  0  . magnesium oxide (MAG-OX) 400 (241.3 Mg) MG tablet Take 1 tablet (400 mg total) by mouth daily. 30 tablet 0  . meclizine (ANTIVERT) 12.5 MG tablet Take 12.5 mg by mouth 3  (three) times daily as needed for dizziness.    . methocarbamol (ROBAXIN) 500 MG tablet Take 500 mg by mouth every 8 (eight) hours as needed for muscle spasms.    . metoprolol tartrate (LOPRESSOR) 25 MG tablet Take 1 tablet (25 mg total) by mouth 2 (two) times daily. 60 tablet 0  . Multiple Vitamins-Minerals (MULTIVITAMIN WITH MINERALS) tablet Take 1 tablet by mouth daily.    . potassium chloride SA (K-DUR,KLOR-CON) 20 MEQ tablet Take 2 tablets (40 mEq total) by mouth daily. (Patient taking differently: Take 20 mEq by mouth 2 (two) times daily. ) 30 tablet 0  . raloxifene (EVISTA) 60 MG tablet Take 60 mg by mouth daily.    . rivaroxaban (XARELTO) 10 MG TABS tablet Take 10 mg by mouth daily.    Marland Kitchen SYNTHROID 50 MCG tablet Take 50 mcg by mouth daily.  2  . VITAMIN B1-B12 IJ Inject 1 mL into the skin every 21 ( twenty-one) days.     . Vitamin D, Ergocalciferol, (DRISDOL) 50000 UNITS CAPS Take 50,000 Units by mouth See admin instructions. Takes on Monday, Wednesday, Friday       Assessment: 86 y.o. female admitted with SBO, h/o Afib and Xarelto on hold, for heparin.  Last dose of Xarelto 1/2  Goal of Therapy:  APTT 66-102 sec Heparin level 0.3-0.7 units/ml Monitor platelets by anticoagulation protocol: Yes   Plan:  Start heparin 500 units/hr at 1000  APTT in 8 hours  Nikhil Osei, [place] 10/14/2019,6:43 AM

## 2019-10-15 LAB — CBC
HCT: 32 % — ABNORMAL LOW (ref 36.0–46.0)
Hemoglobin: 10.2 g/dL — ABNORMAL LOW (ref 12.0–15.0)
MCH: 29.4 pg (ref 26.0–34.0)
MCHC: 31.9 g/dL (ref 30.0–36.0)
MCV: 92.2 fL (ref 80.0–100.0)
Platelets: 263 10*3/uL (ref 150–400)
RBC: 3.47 MIL/uL — ABNORMAL LOW (ref 3.87–5.11)
RDW: 14.9 % (ref 11.5–15.5)
WBC: 8.8 10*3/uL (ref 4.0–10.5)
nRBC: 0 % (ref 0.0–0.2)

## 2019-10-15 LAB — COMPREHENSIVE METABOLIC PANEL
ALT: 13 U/L (ref 0–44)
AST: 22 U/L (ref 15–41)
Albumin: 2.8 g/dL — ABNORMAL LOW (ref 3.5–5.0)
Alkaline Phosphatase: 45 U/L (ref 38–126)
Anion gap: 9 (ref 5–15)
BUN: 10 mg/dL (ref 8–23)
CO2: 24 mmol/L (ref 22–32)
Calcium: 8.5 mg/dL — ABNORMAL LOW (ref 8.9–10.3)
Chloride: 108 mmol/L (ref 98–111)
Creatinine, Ser: 0.61 mg/dL (ref 0.44–1.00)
GFR calc Af Amer: >60 mL/min (ref >60)
GFR calc non Af Amer: >60 mL/min (ref >60)
Glucose, Bld: 87 mg/dL (ref 70–99)
Potassium: 3.2 mmol/L — ABNORMAL LOW (ref 3.5–5.1)
Sodium: 141 mmol/L (ref 135–145)
Total Bilirubin: 0.4 mg/dL (ref 0.3–1.2)
Total Protein: 5.1 g/dL — ABNORMAL LOW (ref 6.5–8.1)

## 2019-10-15 LAB — URINE CULTURE

## 2019-10-15 LAB — HEPARIN LEVEL (UNFRACTIONATED): Heparin Unfractionated: 0.27 IU/mL — ABNORMAL LOW (ref 0.30–0.70)

## 2019-10-15 LAB — APTT: aPTT: 66 seconds — ABNORMAL HIGH (ref 24–36)

## 2019-10-15 MED ORDER — APIXABAN 2.5 MG PO TABS
2.5000 mg | ORAL_TABLET | Freq: Two times a day (BID) | ORAL | Status: DC
  Administered 2019-10-15: 12:00:00 2.5 mg via ORAL
  Filled 2019-10-15: qty 1

## 2019-10-15 NOTE — Progress Notes (Signed)
Pt tolerated breakfast and lunch without nausea or vomiting. Pt has started having diarrhea, but is chronic. Notified Dr. Verlon Au that pt has been taking imodium at home and is asking for here. He asked me to tell pt to hold imodium for 1-2 days in light of the prev bowel obstruction. I told pt and she stated understanding. Cont to monitor. Carroll Kinds RN

## 2019-10-15 NOTE — Discharge Summary (Signed)
Physician Discharge Summary  ELLISHA Gonzalez [id number] DOB: 1921-11-06 DOA: 10/13/2019  PCP: Shon Baton, MD  Admit date: 10/13/2019 Discharge date: 10/15/2019  Time spent: 22 minutes  Recommendations for Outpatient Follow-up:  1. Needs outpatient follow-up with Chem-12 and CBC 2. Recommend home health physical therapy see patient 3. Resume Imodium/calcium as needed  Discharge Diagnoses:  Principal Problem:   SBO (small bowel obstruction) (HCC) Active Problems:   Atrial fibrillation (HCC)   Hypothyroidism   Atypical chest pain   Discharge Condition: Improved  Diet recommendation: Heart healthy  Filed Weights   10/14/19 1819 10/15/19 0357  Weight: 44.3 kg 45.6 kg    History of present illness:  86 year old white female Paroxysmal atrial fibrillation [place] score >4: Xarelto PTA moderate mitral regurg hypothyroid, uterine CA status post surgery, depression anxiety Prior history of SBO last admission for the same 06/2019 Admit to [facility] diffuse chest tightness abdominal pain nausea CT abdomen pelvis shows marked fluid dilation distended large hiatal hernia multiple small bowel loops-NG tube placed Confirmatory coronavirus 19 test is negative  Hospital Course:  By day 2 of hospital stay she had been passing multiple stools a 2 view x-ray of the abdomen pelvis showed resolution and normal bowel She was graduated to a soft diet and therapy saw her We asked home health to see the patient She was resumed on most of her medications on discharge and was instructed to follow-up with her primary physician 2 to 3 weeks post discharge labs   Discharge Exam: Vitals:   10/14/19 2042 10/15/19 0357  BP: (!) 160/81 (!) 156/78  Pulse: 89 81  Resp: (!) 24 17  Temp: 97.6 F (36.4 C) 97.6 F (36.4 C)  SpO2: 98% 98%    General: Awake alert coherent no distress EOMI NCAT no focal deficit no pain no nausea Cardiovascular: S1-S2 no murmur rub or gallop in A. fib Respiratory:  Clinically clear no added sound no rales no rhonchi Abdomen soft nontender No lower extremity edema  Discharge Instructions   Discharge Instructions    Diet - low sodium heart healthy   Complete by: As directed    Discharge instructions   Complete by: As directed    You will notice that some of your medications have temporarily changed and we have discontinued your calcium as well as your amlodipine which can cause constipation in addition you do not require any further potassium at this stage We will ask therapy to see you in the hospital prior to going home but we will ask home health to come out and also assist you make sure that you are stable at home Please eat a soft easy to digest diet for the next 5 to 6 days but we feel you can probably go on your way home as you have had no further symptoms   Increase activity slowly   Complete by: As directed      Allergies as of 10/15/2019      Reactions   Codeine Itching   Penicillins Swelling      Medication List    STOP taking these medications   CALTRATE 600 PO   loperamide 2 MG capsule Commonly known as: IMODIUM   potassium chloride SA 20 MEQ tablet Commonly known as: KLOR-CON   rivaroxaban 10 MG Tabs tablet Commonly known as: XARELTO   Vitamin D3 25 MCG (1000 UT) Caps     TAKE these medications   Eliquis 2.5 MG Tabs tablet Generic drug: apixaban Take 2.5 mg  by mouth 2 (two) times daily.   furosemide 20 MG tablet Commonly known as: LASIX Take 20 mg by mouth daily.   gabapentin 100 MG capsule Commonly known as: NEURONTIN Take 100 mg by mouth 2 (two) times daily as needed (nerve pain).   LORazepam 0.5 MG tablet Commonly known as: ATIVAN Take 0.5 mg by mouth daily as needed for anxiety.   magnesium oxide 400 (241.3 Mg) MG tablet Commonly known as: MAG-OX Take 1 tablet (400 mg total) by mouth daily.   meclizine 12.5 MG tablet Commonly known as: ANTIVERT Take 12.5 mg by mouth 3 (three) times daily as needed  for dizziness.   methocarbamol 500 MG tablet Commonly known as: ROBAXIN Take 500 mg by mouth every 8 (eight) hours as needed for muscle spasms.   metoprolol tartrate 25 MG tablet Commonly known as: LOPRESSOR Take 1 tablet (25 mg total) by mouth 2 (two) times daily.   mirtazapine 15 MG tablet Commonly known as: REMERON Take 15 mg by mouth at bedtime.   multivitamin with minerals tablet Take 1 tablet by mouth daily.   omeprazole 40 MG capsule Commonly known as: PRILOSEC Take 40 mg by mouth daily.   raloxifene 60 MG tablet Commonly known as: EVISTA Take 60 mg by mouth daily.   Synthroid 50 MCG tablet Generic drug: levothyroxine Take 50 mcg by mouth daily.   VITAMIN B1-B12 IJ Inject 1 mL into the skin every 21 ( twenty-one) days.   Vitamin D (Ergocalciferol) 1.25 MG (50000 UT) Caps capsule Commonly known as: DRISDOL Take 50,000 Units by mouth See admin instructions. Takes on Monday, Wednesday, Friday      Allergies  Allergen Reactions  . Codeine Itching  . Penicillins Swelling      The results of significant diagnostics from this hospitalization (including imaging, microbiology, ancillary and laboratory) are listed below for reference.    Significant Diagnostic Studies: DG Abdomen 1 View  Result Date: 10/14/2019 CLINICAL DATA:  NG tube placement. EXAM: ABDOMEN - 1 VIEW COMPARISON:  Earlier this day. FINDINGS: Tip and side port of the enteric tube below the diaphragm in the stomach. Excreted IV contrast within both renal collecting systems. Low lung volumes. IMPRESSION: Tip and side port of the enteric tube below the diaphragm in the stomach. Electronically Signed   By: Keith Rake M.D.   On: 10/14/2019 04:40   DG Abd 1 View  Result Date: 10/14/2019 CLINICAL DATA:  Small bowel obstruction. EXAM: ABDOMEN - 1 VIEW COMPARISON:  CT 2 hours prior FINDINGS: No enteric tube is visualized. Dilated fluid-filled stomach and proximal small bowel on CT are not well visualized  radiographically. Mild gaseous distention of small bowel in the pelvis to 3.2 cm. Excreted IV contrast in the renal collecting systems and urinary bladder. No evidence of free air on this portable supine view. IMPRESSION: Mild gaseous distention of pelvic small bowel to 3.2 cm. Please note majority of the dilated bowel as well as marked gastric distention on CT is fluid-filled and not well seen radiographically. Small bowel obstruction better appreciated on CT 2 hours ago. Electronically Signed   By: Keith Rake M.D.   On: 10/14/2019 03:05   CT ABDOMEN PELVIS W CONTRAST  Result Date: 10/14/2019 CLINICAL DATA:  Nausea vomiting diarrhea and fever EXAM: CT ABDOMEN AND PELVIS WITH CONTRAST TECHNIQUE: Multidetector CT imaging of the abdomen and pelvis was performed using the standard protocol following bolus administration of intravenous contrast. CONTRAST:  118mL OMNIPAQUE IOHEXOL 300 MG/ML  SOLN COMPARISON:  CT 06/27/2019 FINDINGS: Lower chest: Lung bases demonstrate no acute consolidation or pleural effusion. Borderline cardiomegaly. Large fluid-filled hiatal hernia. Hepatobiliary: No focal liver abnormality is seen. No calcified gallstone. Persistent extrahepatic biliary dilatation without calcified stone. Pancreas: Atrophic.  No inflammatory change. Spleen: Normal in size without focal abnormality. Adrenals/Urinary Tract: Adrenal glands are normal. There are cysts within the bilateral kidneys. Subcentimeter hypodensities, too small to further characterize. Slightly hyperdense lesions within the upper pole of the right kidney and the midpole of the left kidney with similar density values on delayed images, possible hemorrhagic or proteinaceous cysts. The bladder is slightly thick walled. Stomach/Bowel: Marked fluid distention of the stomach. Multiple dilated fluid-filled small bowel loops with gradual transition to more normal caliber distal small bowel in the pelvis suggesting bowel obstruction. Small bowel  is dilated up to 3.7 cm. Postsurgical changes in the right lower quadrant. No colon wall thickening. Diverticular disease of the colon. Vascular/Lymphatic: Extensive aortic atherosclerosis. No aneurysm. No significantly enlarged lymph nodes Reproductive: Status post hysterectomy.  No adnexal mass Other: No free air or free fluid. Musculoskeletal: Bones appear osteopenic. Chronic compression fractures at L5 and T11. 6 mm retropulsion upper aspect of L5 vertebral body similar compared to prior. IMPRESSION: 1. Marked fluid dilatation of the stomach with fluid distended large hiatal hernia. Multiple dilated fluid-filled loops of small bowel with transition to more normal caliber small bowel in the pelvis, consistent with small bowel obstruction. Consider NG tube decompression of the stomach. 2. Diverticular disease of the colon without acute inflammatory change 3. Complex bilateral renal cysts. 4. Chronic compression fractures at L5 and T11 Electronically Signed   By: Donavan Foil M.D.   On: 10/14/2019 01:24   DG Chest Portable 1 View  Result Date: 10/14/2019 CLINICAL DATA:  Fever and chest pain. EXAM: PORTABLE CHEST 1 VIEW COMPARISON:  06/27/2019 FINDINGS: Low lung volumes, similar to prior exam. Minimal subsegmental atelectasis at the bases. No evidence of pneumonia. Normal heart size. Hiatal hernia. No pulmonary edema, pleural effusion, or pneumothorax. Calcified granuloma in the right upper lung. Scoliotic curvature of the spine. Bones diffusely under mineralized. No acute osseous abnormalities are seen. IMPRESSION: Low lung volumes with mild bibasilar atelectasis. No evidence of pneumonia. Electronically Signed   By: Keith Rake M.D.   On: 10/14/2019 00:35   DG Abd 2 Views  Result Date: 10/14/2019 CLINICAL DATA:  Small bowel obstruction. EXAM: ABDOMEN - 2 VIEW COMPARISON:  Same day. FINDINGS: The bowel gas pattern is normal. Distal tip of nasogastric tube is seen in proximal stomach. Atherosclerosis of  abdominal aorta is noted. There is no evidence of free air. No radio-opaque calculi or other significant radiographic abnormality is seen. IMPRESSION: No evidence of bowel obstruction or ileus. Aortic atherosclerosis. Distal tip of nasogastric tube is seen in proximal stomach. Electronically Signed   By: Marijo Conception M.D.   On: 10/14/2019 10:55    Microbiology: Recent Results (from the past 240 hour(s))  Urine culture     Status: Abnormal   Collection Time: 10/13/19  8:07 AM   Specimen: Urine, Random  Result Value Ref Range Status   Specimen Description URINE, RANDOM  Final   Special Requests   Final    NONE Performed at [facility], [address] [place], [place] [address]    Culture MULTIPLE SPECIES PRESENT, SUGGEST RECOLLECTION (A)  Final   Report Status 10/15/2019 FINAL  Final  Blood culture (routine x 2)     Status: None (Preliminary  result)   Collection Time: 10/13/19 11:50 PM   Specimen: BLOOD  Result Value Ref Range Status   Specimen Description BLOOD RIGHT ARM  Final   Special Requests   Final    BOTTLES DRAWN AEROBIC AND ANAEROBIC Blood Culture results may not be optimal due to an inadequate volume of blood received in culture bottles   Culture   Final    NO GROWTH < 12 HOURS Performed at [facility], [address], [place], [place] [address]    Report Status PENDING  Incomplete  Blood culture (routine x 2)     Status: None (Preliminary result)   Collection Time: 10/13/19 11:55 PM   Specimen: BLOOD  Result Value Ref Range Status   Specimen Description BLOOD RIGHT FOREARM  Final   Special Requests   Final    BOTTLES DRAWN AEROBIC AND ANAEROBIC Blood Culture results may not be optimal due to an inadequate volume of blood received in culture bottles   Culture   Final    NO GROWTH < 12 HOURS Performed at [facility], [place] [address], [place], [place] [address]    Report Status PENDING  Incomplete  SARS CORONAVIRUS 2 (TAT 6-24 HRS) Nasopharyngeal      Status: None   Collection Time: 10/14/19  2:07 AM   Specimen: Nasopharyngeal  Result Value Ref Range Status   SARS Coronavirus 2 NEGATIVE NEGATIVE Final    Comment: (NOTE) SARS-CoV-2 target nucleic acids are NOT DETECTED. The SARS-CoV-2 RNA is generally detectable in upper and lower respiratory specimens during the acute phase of infection. Negative results do not preclude SARS-CoV-2 infection, do not rule out co-infections with other pathogens, and should not be used as the sole basis for treatment or other patient management decisions. Negative results must be combined with clinical observations, patient history, and epidemiological information. The expected result is Negative. Fact Sheet for Patients: SugarRoll.be Fact Sheet for Healthcare Providers: https://www.woods-mathews.com/ This test is not yet approved or cleared by the [place] FDA and  has been authorized for detection and/or diagnosis of SARS-CoV-2 by FDA under an Emergency Use Authorization (EUA). This EUA will remain  in effect (meaning this test can be used) for the duration of the COVID-19 declaration under Section 56 4(b)(1) of the Act, 21 U.S.C. section 360bbb-3(b)(1), unless the authorization is terminated or revoked sooner. Performed at [facility], [place] [address], [place], [place] [address]      Labs: Basic Metabolic Panel: Recent Labs  Lab 10/13/19 2330 10/14/19 0627 10/15/19 0646  NA 139 145 141  K 3.5 3.9 3.2*  CL 109 117* 108  CO2 19* 20* 24  GLUCOSE 190* 130* 87  BUN 13 17 10   CREATININE 0.93 0.91 0.61  CALCIUM 9.5 8.1* 8.5*   Liver Function Tests: Recent Labs  Lab 10/13/19 2330 10/15/19 0646  AST 22 22  ALT 16 13  ALKPHOS 55 45  BILITOT 0.6 0.4  PROT 6.7 5.1*  ALBUMIN 3.7 2.8*   Recent Labs  Lab 10/13/19 2330  LIPASE 30   No results for input(s): AMMONIA in the last 168 hours. CBC: Recent Labs  Lab 10/13/19 2330  10/14/19 0627 10/15/19 0646  WBC 10.6* 10.8* 8.8  NEUTROABS 9.1* 7.9*  --   HGB 13.4 10.6* 10.2*  HCT 41.2 33.8* 32.0*  MCV 92.4 93.9 92.2  PLT 355 292 263   Cardiac Enzymes: No results for input(s): CKTOTAL, CKMB, CKMBINDEX, TROPONINI in the last 168 hours. BNP: BNP (  last 3 results) No results for input(s): BNP in the last 8760 hours.  ProBNP (last 3 results) No results for input(s): PROBNP in the last 8760 hours.  CBG: No results for input(s): GLUCAP in the last 168 hours.     Signed:  Nita Sells MD   [facility] 10/15/2019, 8:40 AM  28

## 2019-10-15 NOTE — TOC Transition Note (Addendum)
Transition of Care [facility]) - CM/SW Discharge Note   Patient Details  Name: Teresa Gonzalez MRN: [id number] Date of Birth: 07-31-1922  [facility] CM/SW Contact:  Zenon Mayo, RN Phone Number: 10/15/2019, 9:24 AM   Clinical Narrative:    Patient from home alone, she has around the clock care, pt eval rec HHPT, NCM offered choice , she states she is ok with [facility], referral given to [place] with [facility], awaiting to see if they can take referrral .  Soc will begin 24 to 48 hrs post dc. Patient has transportation home and she has a PCP and no issues with getting meds.  She has a walker at home. Tiffany states she is able to take referral for HHPT.  Medicare.gov list on chart.   Final next level of care: [place] Barriers to Discharge: No Barriers Identified   Patient Goals and CMS Choice Patient states their goals for this hospitalization and ongoing recovery are:: go home CMS Medicare.gov Compare Post Acute Care list provided to:: Patient Choice offered to / list presented to : Patient  Discharge Placement                       Discharge Plan and Services                DME Arranged: (NA)         HH Arranged: PT HH Agency: [facility] (formerly [employer]) Date [place]: 10/15/19 Time [place]: [phone] Representative spoke with at [place]: [place] ([place]) Interventions     Readmission Risk Interventions No flowsheet data found.

## 2019-10-15 NOTE — Evaluation (Signed)
Physical Therapy Evaluation Patient Details Name: Teresa Gonzalez MRN: [id number] DOB: 08/26/1922 Today's Date: 10/15/2019   History of Present Illness  86yo female c/o chest and abdominal pain, nausea/vomiting/diarrhea, and fever. Was exposed to Covid-19 on Christmas Eve but found to be covid negative. Admitted for SBO,A-fib. PMH A-fib, peripheral neuropathy, SBO, small bowel resection, uterine cancer  Clinical Impression   Patient received in bed, very pleasant and cooperative with PT although moderately HOH. Able to complete bed mobility with min guard, functional transfers with MinA/RW, and gait approximately 28f with RW and min guard. She reports she is generally at her baseline for mobility, and seems to be well cared for/have appropriate set up and assistance at home. She was left up in the chair with all needs met this morning. Currently agree with plan for skilled HHPT services moving forward.     Follow Up Recommendations Home health PT;Supervision for mobility/OOB    Equipment Recommendations  None recommended by PT;Other (comment)(well equipped)    Recommendations for Other Services       Precautions / Restrictions Precautions Precautions: Fall Restrictions Weight Bearing Restrictions: No      Mobility  Bed Mobility Overal bed mobility: Needs Assistance Bed Mobility: Supine to Sit     Supine to sit: Min guard     General bed mobility comments: min guard, extended time  Transfers Overall transfer level: Needs assistance Equipment used: Rolling walker (2 wheeled) Transfers: Sit to/from Stand Sit to Stand: Min assist         General transfer comment: MinA to boost to upright standing position, good awareness of hand placement and sequencing  Ambulation/Gait Ambulation/Gait assistance: Min guard Gait Distance (Feet): 15 Feet Assistive device: Rolling walker (2 wheeled) Gait Pattern/deviations: Step-through pattern;Decreased step length - right;Decreased step  length - left;Trunk flexed Gait velocity: decreased   General Gait Details: slow but steady with RW, reports she is generally at her baseline for mobility  Stairs            Wheelchair Mobility    Modified Rankin (Stroke Patients Only)       Balance Overall balance assessment: Needs assistance Sitting-balance support: Bilateral upper extremity supported;Feet supported Sitting balance-Leahy Scale: Good     Standing balance support: Bilateral upper extremity supported;During functional activity Standing balance-Leahy Scale: Fair Standing balance comment: reliant on BUE support                             Pertinent Vitals/Pain Pain Assessment: No/denies pain    Home Living Family/patient expects to be discharged to:: Private residence Living Arrangements: Alone Available Help at Discharge: Personal care attendant;Other (Comment)(someone is always there overnight, they are in and out during the day) Type of Home: House Home Access: Level entry     Home Layout: Two level Home Equipment: Walker - 4 wheels;Transport chair;Hand held shower head;Grab bars - toilet;Shower seat - built in;Grab bars - tub/shower;Cane - single point;Wheelchair - manual Additional Comments: only uses WC when out of the house    Prior Function Level of Independence: Needs assistance   Gait / Transfers Assistance Needed: reports she does need help to walk in her home  ADL's / Homemaking Assistance Needed: continues to receive help for bathing/dressing        Hand Dominance   Dominant Hand: Right    Extremity/Trunk Assessment   Upper Extremity Assessment Upper Extremity Assessment: Generalized weakness    Lower Extremity Assessment Lower Extremity  Assessment: Generalized weakness    Cervical / Trunk Assessment Cervical / Trunk Assessment: Kyphotic  Communication   Communication: HOH  Cognition Arousal/Alertness: Awake/alert Behavior During Therapy: WFL for tasks  assessed/performed Overall Cognitive Status: Within Functional Limits for tasks assessed                                 General Comments: somewhat HOH but very cooperative and participatory, follows cues well      General Comments General comments (skin integrity, edema, etc.): VSS on room air    Exercises     Assessment/Plan    PT Assessment Patient needs continued PT services  PT Problem List Decreased strength;Decreased activity tolerance;Decreased safety awareness;Decreased balance;Decreased mobility       PT Treatment Interventions DME instruction;Balance training;Gait training;Neuromuscular re-education;Stair training;Functional mobility training;Patient/family education;Therapeutic activities;Therapeutic exercise    PT Goals (Current goals can be found in the Care Plan section)  Acute Rehab PT Goals Patient Stated Goal: go home today PT Goal Formulation: With patient Time For Goal Achievement: 10/29/19 Potential to Achieve Goals: Good    Frequency Min 3X/week   Barriers to discharge        Co-evaluation               AM-PAC PT "6 Clicks" Mobility  Outcome Measure Help needed turning from your back to your side while in a flat bed without using bedrails?: A Little Help needed moving from lying on your back to sitting on the side of a flat bed without using bedrails?: A Little Help needed moving to and from a bed to a chair (including a wheelchair)?: A Little Help needed standing up from a chair using your arms (e.g., wheelchair or bedside chair)?: A Little Help needed to walk in hospital room?: A Little Help needed climbing 3-5 steps with a railing? : A Lot 6 Click Score: 17    End of Session Equipment Utilized During Treatment: Gait belt Activity Tolerance: Patient tolerated treatment well Patient left: in chair;with call bell/phone within reach   PT Visit Diagnosis: Muscle weakness (generalized) (M62.81);Difficulty in walking, not  elsewhere classified (R26.2)    Time: 1829-9371 PT Time Calculation (min) (ACUTE ONLY): 20 min   Charges:   PT Evaluation $PT Eval Low Complexity: 1 Low          Windell Norfolk, DPT, PN1   Supplemental Physical Therapist [place]    Pager [phone] Acute Rehab Office [phone]

## 2019-10-19 LAB — CULTURE, BLOOD (ROUTINE X 2)
Culture: NO GROWTH
Culture: NO GROWTH

## 2019-10-25 DIAGNOSIS — K56600 Partial intestinal obstruction, unspecified as to cause: Secondary | ICD-10-CM | POA: Diagnosis not present

## 2019-10-25 DIAGNOSIS — I480 Paroxysmal atrial fibrillation: Secondary | ICD-10-CM | POA: Diagnosis not present

## 2019-10-25 DIAGNOSIS — Z7901 Long term (current) use of anticoagulants: Secondary | ICD-10-CM | POA: Diagnosis not present

## 2019-10-25 DIAGNOSIS — E876 Hypokalemia: Secondary | ICD-10-CM | POA: Diagnosis not present

## 2019-10-25 DIAGNOSIS — R627 Adult failure to thrive: Secondary | ICD-10-CM | POA: Diagnosis not present

## 2019-10-25 DIAGNOSIS — N182 Chronic kidney disease, stage 2 (mild): Secondary | ICD-10-CM | POA: Diagnosis not present

## 2019-10-25 DIAGNOSIS — D649 Anemia, unspecified: Secondary | ICD-10-CM | POA: Diagnosis not present

## 2019-10-25 DIAGNOSIS — I2510 Atherosclerotic heart disease of native coronary artery without angina pectoris: Secondary | ICD-10-CM | POA: Diagnosis not present

## 2019-10-25 DIAGNOSIS — I130 Hypertensive heart and chronic kidney disease with heart failure and stage 1 through stage 4 chronic kidney disease, or unspecified chronic kidney disease: Secondary | ICD-10-CM | POA: Diagnosis not present

## 2019-11-01 DIAGNOSIS — E781 Pure hyperglyceridemia: Secondary | ICD-10-CM | POA: Diagnosis not present

## 2019-11-06 ENCOUNTER — Other Ambulatory Visit (HOSPITAL_COMMUNITY): Payer: Self-pay | Admitting: Internal Medicine

## 2019-11-06 ENCOUNTER — Ambulatory Visit (HOSPITAL_COMMUNITY)
Admission: RE | Admit: 2019-11-06 | Discharge: 2019-11-06 | Disposition: A | Discharge status: Home / Self Care | Payer: Medicare HMO | Source: Ambulatory Visit | Attending: Vascular Surgery | Admitting: Vascular Surgery

## 2019-11-06 ENCOUNTER — Other Ambulatory Visit: Payer: Self-pay

## 2019-11-06 DIAGNOSIS — M7989 Other specified soft tissue disorders: Secondary | ICD-10-CM

## 2020-01-21 DIAGNOSIS — R531 Weakness: Secondary | ICD-10-CM | POA: Diagnosis not present

## 2020-01-21 DIAGNOSIS — K529 Noninfective gastroenteritis and colitis, unspecified: Secondary | ICD-10-CM | POA: Diagnosis not present

## 2020-01-21 DIAGNOSIS — I480 Paroxysmal atrial fibrillation: Secondary | ICD-10-CM | POA: Diagnosis not present

## 2020-01-21 DIAGNOSIS — I8310 Varicose veins of unspecified lower extremity with inflammation: Secondary | ICD-10-CM | POA: Diagnosis not present

## 2020-01-21 DIAGNOSIS — D649 Anemia, unspecified: Secondary | ICD-10-CM | POA: Diagnosis not present

## 2020-01-21 DIAGNOSIS — F419 Anxiety disorder, unspecified: Secondary | ICD-10-CM | POA: Diagnosis not present

## 2020-01-21 DIAGNOSIS — M7989 Other specified soft tissue disorders: Secondary | ICD-10-CM | POA: Diagnosis not present

## 2020-01-21 DIAGNOSIS — E876 Hypokalemia: Secondary | ICD-10-CM | POA: Diagnosis not present

## 2020-03-14 ENCOUNTER — Emergency Department (HOSPITAL_COMMUNITY): Payer: Medicare HMO

## 2020-03-14 ENCOUNTER — Emergency Department (HOSPITAL_COMMUNITY)
Admission: EM | Admit: 2020-03-14 | Discharge: 2020-03-15 | Disposition: A | Discharge status: Home / Self Care | Payer: Medicare HMO | Attending: Emergency Medicine | Admitting: Emergency Medicine

## 2020-03-14 ENCOUNTER — Other Ambulatory Visit: Payer: Self-pay

## 2020-03-14 ENCOUNTER — Encounter (HOSPITAL_COMMUNITY): Payer: Self-pay | Admitting: Emergency Medicine

## 2020-03-14 DIAGNOSIS — Y999 Unspecified external cause status: Secondary | ICD-10-CM | POA: Insufficient documentation

## 2020-03-14 DIAGNOSIS — Z79899 Other long term (current) drug therapy: Secondary | ICD-10-CM | POA: Insufficient documentation

## 2020-03-14 DIAGNOSIS — Y9389 Activity, other specified: Secondary | ICD-10-CM | POA: Diagnosis not present

## 2020-03-14 DIAGNOSIS — S299XXA Unspecified injury of thorax, initial encounter: Secondary | ICD-10-CM | POA: Diagnosis not present

## 2020-03-14 DIAGNOSIS — R0902 Hypoxemia: Secondary | ICD-10-CM | POA: Diagnosis not present

## 2020-03-14 DIAGNOSIS — W19XXXA Unspecified fall, initial encounter: Secondary | ICD-10-CM

## 2020-03-14 DIAGNOSIS — I4891 Unspecified atrial fibrillation: Secondary | ICD-10-CM | POA: Insufficient documentation

## 2020-03-14 DIAGNOSIS — Y92019 Unspecified place in single-family (private) house as the place of occurrence of the external cause: Secondary | ICD-10-CM | POA: Diagnosis not present

## 2020-03-14 DIAGNOSIS — Z7901 Long term (current) use of anticoagulants: Secondary | ICD-10-CM | POA: Diagnosis not present

## 2020-03-14 DIAGNOSIS — S199XXA Unspecified injury of neck, initial encounter: Secondary | ICD-10-CM | POA: Diagnosis not present

## 2020-03-14 DIAGNOSIS — T07XXXA Unspecified multiple injuries, initial encounter: Secondary | ICD-10-CM | POA: Diagnosis present

## 2020-03-14 DIAGNOSIS — W1830XA Fall on same level, unspecified, initial encounter: Secondary | ICD-10-CM | POA: Diagnosis not present

## 2020-03-14 DIAGNOSIS — S329XXA Fracture of unspecified parts of lumbosacral spine and pelvis, initial encounter for closed fracture: Secondary | ICD-10-CM | POA: Insufficient documentation

## 2020-03-14 DIAGNOSIS — R519 Headache, unspecified: Secondary | ICD-10-CM | POA: Insufficient documentation

## 2020-03-14 DIAGNOSIS — S22080A Wedge compression fracture of T11-T12 vertebra, initial encounter for closed fracture: Secondary | ICD-10-CM | POA: Insufficient documentation

## 2020-03-14 DIAGNOSIS — I213 ST elevation (STEMI) myocardial infarction of unspecified site: Secondary | ICD-10-CM | POA: Diagnosis not present

## 2020-03-14 DIAGNOSIS — S32050A Wedge compression fracture of fifth lumbar vertebra, initial encounter for closed fracture: Secondary | ICD-10-CM | POA: Diagnosis not present

## 2020-03-14 DIAGNOSIS — R52 Pain, unspecified: Secondary | ICD-10-CM | POA: Diagnosis not present

## 2020-03-14 DIAGNOSIS — S3993XA Unspecified injury of pelvis, initial encounter: Secondary | ICD-10-CM | POA: Diagnosis not present

## 2020-03-14 DIAGNOSIS — M899 Disorder of bone, unspecified: Secondary | ICD-10-CM | POA: Insufficient documentation

## 2020-03-14 DIAGNOSIS — M5489 Other dorsalgia: Secondary | ICD-10-CM | POA: Diagnosis not present

## 2020-03-14 DIAGNOSIS — S79911A Unspecified injury of right hip, initial encounter: Secondary | ICD-10-CM | POA: Diagnosis not present

## 2020-03-14 DIAGNOSIS — R937 Abnormal findings on diagnostic imaging of other parts of musculoskeletal system: Secondary | ICD-10-CM | POA: Diagnosis not present

## 2020-03-14 DIAGNOSIS — M546 Pain in thoracic spine: Secondary | ICD-10-CM | POA: Diagnosis not present

## 2020-03-14 DIAGNOSIS — M25561 Pain in right knee: Secondary | ICD-10-CM | POA: Diagnosis not present

## 2020-03-14 MED ORDER — METOPROLOL TARTRATE 25 MG PO TABS
25.0000 mg | ORAL_TABLET | Freq: Once | ORAL | Status: AC
  Administered 2020-03-14: 25 mg via ORAL
  Filled 2020-03-14: qty 1

## 2020-03-14 NOTE — ED Provider Notes (Signed)
[facility] EMERGENCY DEPARTMENT Provider Note   CSN: [id number] Arrival date & time: 03/14/20  1918     History Chief Complaint  Patient presents with  . LEVEL 2  . Fall    Teresa Gonzalez is a 86 y.o. female.  Patient is a 86 year old female who presents after a fall.  She states that she was getting a snack and lost her balance, falling backward and hitting her head and landing on her back.  There is no loss of consciousness.  She complains of pain in her neck and back.  She also has some pain in her right hip.  She denies any abdominal pain.  No rib pain.  No vomiting.  No worsening headaches.  She is on Eliquis for atrial fibrillation.  She states she took her last dose this morning.  She came in as a level two trauma.  She denies any symptoms preceding the episode such as dizziness, chest pain, shortness of breath.  No recent illnesses.  She said she just lost her balance and fell backward.        Past Medical History:  Diagnosis Date  . Atrial fibrillation ([place])     There are no problems to display for this patient.   The histories are not reviewed yet. Please review them in the "History" navigator section and refresh this [place].   OB History   No obstetric history on file.     No family history on file.  Social History   Tobacco Use  . Smoking status: Not on file  Substance Use Topics  . Alcohol use: Not on file  . Drug use: Not on file    Home Medications Prior to Admission medications   Medication Sig Start Date End Date Taking? Authorizing Provider  apixaban (ELIQUIS) 2.5 MG TABS tablet Take 2.5 mg by mouth 2 (two) times daily.   Yes [provider]  cholecalciferol (VITAMIN D) 25 MCG (1000 UNIT) tablet Take 1,000 Units by mouth daily.   Yes [provider]  Cholecalciferol (VITAMIN D3) 1.25 MG (50000 UT) CAPS Take 1 capsule by mouth 4 (four) times a week. 02/19/20  Yes [provider]  loperamide  (IMODIUM) 2 MG capsule Take 2 mg by mouth as needed for diarrhea or loose stools.   Yes [provider]  metoprolol tartrate (LOPRESSOR) 25 MG tablet Take 25 mg by mouth 2 (two) times daily. 01/01/20  Yes [provider]  omeprazole (PRILOSEC) 40 MG capsule Take 40 mg by mouth daily. 03/04/20  Yes [provider]  raloxifene (EVISTA) 60 MG tablet Take 60 mg by mouth daily. 02/18/20  Yes [provider]  SYNTHROID 50 MCG tablet Take 50 mcg by mouth daily. 02/04/20  Yes [provider]  VITAMIN B1-B12 IJ Inject 1 vial as directed every 21 ( twenty-one) days.   Yes [provider]  lidocaine (LIDODERM) 5 % Place 1 patch onto the skin daily. Remove & Discard patch within 12 hours or as directed by MD 03/15/20   Malvin Johns, MD    Allergies    Codeine and Penicillins  Review of Systems   Review of Systems  Constitutional: Negative for chills, diaphoresis, fatigue and fever.  HENT: Negative for congestion, rhinorrhea and sneezing.   Eyes: Negative.   Respiratory: Negative for cough, chest tightness and shortness of breath.   Cardiovascular: Negative for chest pain and leg swelling.  Gastrointestinal: Negative for abdominal pain, blood in stool, diarrhea, nausea and  vomiting.  Genitourinary: Negative for difficulty urinating, flank pain, frequency and hematuria.  Musculoskeletal: Positive for arthralgias, back pain and neck pain.  Skin: Negative for rash.  Neurological: Positive for headaches. Negative for dizziness, speech difficulty, weakness and numbness.    Physical Exam Updated Vital Signs BP (!) 158/88   Pulse 96   Temp 98.1 F (36.7 C) (Oral)   Resp (!) 22   Ht 5\' 4"  (1.626 m)   Wt 45.4 kg   SpO2 97%   BMI 17.16 kg/m   Physical Exam Constitutional:      Appearance: She is well-developed.  HENT:     Head: Normocephalic.     Comments: Small hematoma to posterior scalp Eyes:     Pupils: Pupils are equal, round, and reactive  to light.  Neck:     Comments: Towel roll in place.  Patient has tenderness along the lower cervical spine.  There is some pain to the mid thoracic spine and some mild tenderness to the lower lumbosacral spine.  No step-offs or deformities Cardiovascular:     Rate and Rhythm: Normal rate and regular rhythm.     Heart sounds: Normal heart sounds.  Pulmonary:     Effort: Pulmonary effort is normal. No respiratory distress.     Breath sounds: Normal breath sounds. No wheezing or rales.  Chest:     Chest wall: No tenderness.  Abdominal:     General: Bowel sounds are normal.     Palpations: Abdomen is soft.     Tenderness: There is no abdominal tenderness. There is no guarding or rebound.  Musculoskeletal:     Comments: Mild tenderness on range of motion of the right hip and on palpation of the left knee.  There is no swelling or deformity noted.  Pedal pulses are intact.  There is no other pain on palpation or range of motion of the extremities  Lymphadenopathy:     Cervical: No cervical adenopathy.  Skin:    General: Skin is warm and dry.     Findings: No rash.  Neurological:     General: No focal deficit present.     Mental Status: She is alert and oriented to person, place, and time.     ED Results / Procedures / Treatments   Labs (all labs ordered are listed, but only abnormal results are displayed) Labs Reviewed - No data to display  EKG None  Radiology CT Head Wo Contrast  Result Date: 03/14/2020 CLINICAL DATA:  Headache, fall EXAM: CT HEAD WITHOUT CONTRAST TECHNIQUE: Contiguous axial images were obtained from the base of the skull through the vertex without intravenous contrast. COMPARISON:  04/08/2017 FINDINGS: Brain: There is atrophy and chronic small vessel disease changes. No acute intracranial abnormality. Specifically, no hemorrhage, hydrocephalus, mass lesion, acute infarction, or significant intracranial injury. Vascular: No hyperdense vessel or unexpected  calcification. Skull: No acute calvarial abnormality. Sinuses/Orbits: Mucosal thickening in the ethmoid air cells and maxillary sinuses. Other: None IMPRESSION: Atrophy, chronic microvascular disease. No acute intracranial abnormality. Chronic sinusitis Electronically Signed   By: Rolm Baptise M.D.   On: 03/14/2020 20:32   CT Cervical Spine Wo Contrast  Result Date: 03/14/2020 CLINICAL DATA:  Fall EXAM: CT CERVICAL SPINE WITHOUT CONTRAST TECHNIQUE: Multidetector CT imaging of the cervical spine was performed without intravenous contrast. Multiplanar CT image reconstructions were also generated. COMPARISON:  05/25/2016 FINDINGS: Alignment: Slight anterolisthesis of C4 on C5 related to facet disease. Skull base and vertebrae: No acute fracture. No primary bone  lesion or focal pathologic process. Soft tissues and spinal canal: No prevertebral fluid or swelling. No visible canal hematoma. Disc levels:  Diffuse degenerative disc disease and facet disease. Upper chest: Biapical scarring. Other: Carotid artery calcifications. IMPRESSION: Degenerative disc and facet disease.  No acute bony abnormality. Electronically Signed   By: Rolm Baptise M.D.   On: 03/14/2020 20:34   CT Thoracic Spine Wo Contrast  Result Date: 03/14/2020 CLINICAL DATA:  Mid back pain. Additional provided: Fall on blood thinners, trip and fall backwards. EXAM: CT THORACIC SPINE WITHOUT CONTRAST TECHNIQUE: Multidetector CT images of the thoracic were obtained using the standard protocol without intravenous contrast. COMPARISON:  No pertinent prior studies available for comparison. FINDINGS: Alignment: Prominent thoracic dextrocurvature. No significant spondylolisthesis. Trace bony retropulsion at the level of the T11 superior endplate. Vertebrae: There is a mild age-indeterminate T11 superior endplate compression fracture. Trace bony retropulsion at the level of the T11 superior endplate. Vertebral body height is otherwise maintained. No evidence  of acute fracture to the thoracic spine elsewhere. Paraspinal and other soft tissues: Paraspinal soft tissues within normal limits. No evidence of pneumothorax or airspace consolidation within the visualized lung fields. Biapical pleuroparenchymal scarring. Sizable hiatal hernia. Aortic and coronary artery atherosclerosis. Disc levels: Thoracic spondylosis with multilevel disc space narrowing. There is mild multilevel degenerative endplate sclerosis. No appreciable significant bony spinal canal stenosis at any level. IMPRESSION: Age-indeterminate mild T11 superior endplate compression fracture. No evidence of acute fracture to the thoracic spine elsewhere. Thoracic spondylosis without high-grade bony spinal canal stenosis. Sizable hiatal hernia. Aortic Atherosclerosis (ICD10-I70.0) and coronary artery atherosclerosis. Electronically Signed   By: Kellie Simmering DO   On: 03/14/2020 20:49   CT Lumbar Spine Wo Contrast  Result Date: 03/14/2020 CLINICAL DATA:  Low back pain, trauma EXAM: CT LUMBAR SPINE WITHOUT CONTRAST TECHNIQUE: Multidetector CT imaging of the lumbar spine was performed without intravenous contrast administration. Multiplanar CT image reconstructions were also generated. COMPARISON:  10/14/2019 CT abdomen FINDINGS: Segmentation: The lowest lumbar type non-rib-bearing vertebra is labeled as L5. Alignment: 4 mm degenerative retrolisthesis at L1-2, unchanged from prior. Mild dextroconvex lumbar scoliosis with rotary component. Vertebrae: Bony demineralization. Right eccentric hemangioma in the L2 vertebral body. 55% compression fracture at L5 is unchanged from 10/14/2019, and is associated with 5 mm of chronic posterior bony retropulsion from the posterosuperior endplate of L5. Calcifications noted along the iliolumbar ligaments. Compared to 11/11/2019, there appears to be mild widening/diastasis of the right sacroiliac joint along with some subtle soft tissue density along the anterior margin of the  right SI joint and along the sacroiliac margin of the sciatic notch, which could be from a mild diastatic injury of the SI joint or a subtle nondisplaced fracture of the right sacral ala with extension into the joint. The appearance is less striking and only equivocal on the left. Subtle presacral edema. Paraspinal and other soft tissues: Aortoiliac atherosclerotic vascular disease. Right renal cyst. Disc levels: Lumbar spondylosis and degenerative disc disease are present, causing left foraminal impingement at L1-2 and L2-3. Despite the bony retropulsion, I do not see significant impingement at the L4-5 level. IMPRESSION: 1. Compared to 11/11/2019, there appears to be mild widening/diastasis of the right sacroiliac joint along with some subtle soft tissue density along the anterior margin of the right SI joint and along the sacroiliac margin of the sciatic notch, which could be from a mild diastatic injury of the right SI joint or a subtle nondisplaced fracture of the right sacral ala with  extension into the joint. The appearance is less striking and only equivocal on the left. 2. Stable 55% compression fracture at L5 associated with 5 mm of chronic posterior bony retropulsion from the posterosuperior endplate of L5. 3. Bony demineralization. 4. Lumbar spondylosis and degenerative disc disease causing left foraminal impingement at L1-2 and L2-3. 5. Aortic atherosclerosis. Aortic Atherosclerosis (ICD10-I70.0). Electronically Signed   By: Van Clines M.D.   On: 03/14/2020 20:57   CT PELVIS WO CONTRAST  Result Date: 03/14/2020 CLINICAL DATA:  Status post fall. EXAM: CT PELVIS WITHOUT CONTRAST TECHNIQUE: Multidetector CT imaging of the pelvis was performed following the standard protocol without intravenous contrast. COMPARISON:  None. FINDINGS: Urinary Tract:  No abnormality visualized. Bowel: There is no evidence of bowel dilatation. The appendix is not identified. Surgical sutures are seen along the cecum.  Numerous noninflamed diverticula are seen throughout the large bowel. Vascular/Lymphatic: No pathologically enlarged lymph nodes. There is marked severity calcification of the abdominal aorta and bilateral common iliac arteries, without evidence of aneurysmal dilatation. Reproductive: The uterus is not identified and appears to be surgically absent. Other:  None. Musculoskeletal: A compression fracture deformity of indeterminate age is seen involving the L5 vertebral body. Heterogeneous sclerotic changes are seen throughout the L5 vertebral body and superior aspect of the sacrum. IMPRESSION: 1. Compression fracture deformity of indeterminate age involving the L5 vertebral body. 2. Heterogeneous sclerotic changes throughout the L5 vertebral body and superior aspect of the sacrum. This may represent sequelae associated with an underlying neoplastic process. MRI correlation is recommended. 3. Colonic diverticulosis. 4. Aortic atherosclerosis. Aortic Atherosclerosis (ICD10-I70.0). Electronically Signed   By: Virgina Norfolk M.D.   On: 03/14/2020 22:24   DG Knee Complete 4 Views Left  Result Date: 03/14/2020 CLINICAL DATA:  Fall, right knee pain EXAM: LEFT KNEE - COMPLETE 4+ VIEW COMPARISON:  04/08/2017 FINDINGS: No acute bony abnormality. Specifically, no fracture, subluxation, or dislocation. No joint effusion. Joint spaces maintained. IMPRESSION: No acute bony abnormality. Electronically Signed   By: Rolm Baptise M.D.   On: 03/14/2020 20:37   DG Hip Unilat W or Wo Pelvis 2-3 Views Right  Result Date: 03/14/2020 CLINICAL DATA:  Fall, right hip pain EXAM: DG HIP (WITH OR WITHOUT PELVIS) 2-3V RIGHT COMPARISON:  None. FINDINGS: Diffuse osteopenia. No acute bony abnormality. Specifically, no fracture, subluxation, or dislocation. IMPRESSION: No acute bony abnormality. Electronically Signed   By: Rolm Baptise M.D.   On: 03/14/2020 20:37    Procedures Procedures (including critical care time)  Medications  Ordered in ED Medications  metoprolol tartrate (LOPRESSOR) tablet 25 mg (25 mg Oral Given 03/14/20 2234)    ED Course  I have reviewed the triage vital signs and the nursing notes.  Pertinent labs & imaging results that were available during my care of the patient were reviewed by me and considered in my medical decision making (see chart for details).    MDM Rules/Calculators/A&P                      Patient presents after mechanical fall.  She has no evidence of intracranial hemorrhage.  No evidence of cervical spine injury.  She has a age-indeterminate superior endplate compression of T11.  She does have some tenderness around this area and it could represent a new injury.  She does not have any neurologic deficits.  She also has a possible hematoma and widening of the SI joint with the possible nondisplaced fracture.  I did do a CT  scan of her pelvis which did not show any fracture/ring fracture.  There is some possible bony lesions.  I discussed these findings with the patient and advised that she will need follow-up with her PCP to arrange an MRI.  Her daughter is at bedside as well.  We did get her up and she is able to ambulate with a walker which she does at home so she is comfortable being discharged.  She does not want any prescription for pain medication.  She was given a prescription for Lidoderm patches.  She is in atrial fibrillation which is chronic for her.  Her heart rate is a little bit high.  She did not take her Lopressor this evening and she was given a dose in the ED.  Her last heart rate is in the 90s.  She was discharged home in good condition.  She was advised to follow-up with her PCP early this week.  Return precautions were given. Final Clinical Impression(s) / ED Diagnoses Final diagnoses:  Fall, initial encounter  Compression fracture of T11 vertebra, initial encounter ([place])  Closed nondisplaced fracture of pelvis, unspecified part of pelvis, initial encounter ([place])    Lytic bone lesions on xray    Rx / DC Orders ED Discharge Orders         Ordered    lidocaine (LIDODERM) 5 %  Every 24 hours     03/15/20 0000           Malvin Johns, MD 03/15/20 3736

## 2020-03-14 NOTE — Progress Notes (Signed)
Orthopedic Tech Progress Note Patient Details:  Teresa Gonzalez March 23, 1922 [id number]  Level 2 trauma  Tammy Sours 03/14/2020, 7:45 PM

## 2020-03-14 NOTE — ED Triage Notes (Addendum)
Pt present to ED BIB [facility]. Pt level 2 fall on blood thinners. Pt states that she tripped and fell backwards. Denies LOC. On blood thinner d/t a fib hx. Pt c/o R hip pain, head, and back pain throughout worsening in lumbar. Palpable hematoma on back of head. EMS VSS.

## 2020-03-15 MED ORDER — LIDOCAINE 5 % EX PTCH: 1.0000 | MEDICATED_PATCH | CUTANEOUS | 0 refills | Status: DC

## 2020-03-15 NOTE — ED Notes (Signed)
Pt ambulated in hallway with walker (baseline) and 2 person standby assist

## 2020-03-15 NOTE — Discharge Instructions (Signed)
You have a small compression fracture of one of your thoracic vertebrae.  He had a possible fracture versus contusion of your SI joint.  There were some bony lesions noted on x-ray and this needs to be followed up by an MRI which can be arranged by your primary care doctor.  Call and make appointment with your primary care doctor early next week for follow-up.  Return here as needed for any worsening symptoms.

## 2020-03-17 ENCOUNTER — Encounter (HOSPITAL_COMMUNITY): Payer: Self-pay | Admitting: Family Medicine

## 2020-03-21 ENCOUNTER — Other Ambulatory Visit: Payer: Self-pay | Admitting: Internal Medicine

## 2020-03-21 DIAGNOSIS — M899 Disorder of bone, unspecified: Secondary | ICD-10-CM

## 2020-05-05 ENCOUNTER — Other Ambulatory Visit: Payer: Self-pay

## 2020-05-05 ENCOUNTER — Ambulatory Visit
Admission: RE | Admit: 2020-05-05 | Discharge: 2020-05-05 | Disposition: A | Discharge status: Home / Self Care | Payer: Medicare HMO | Source: Ambulatory Visit | Attending: Internal Medicine | Admitting: Internal Medicine

## 2020-05-05 DIAGNOSIS — M5127 Other intervertebral disc displacement, lumbosacral region: Secondary | ICD-10-CM | POA: Diagnosis not present

## 2020-05-05 DIAGNOSIS — M899 Disorder of bone, unspecified: Secondary | ICD-10-CM

## 2020-05-05 DIAGNOSIS — M47816 Spondylosis without myelopathy or radiculopathy, lumbar region: Secondary | ICD-10-CM | POA: Diagnosis not present

## 2020-05-05 DIAGNOSIS — M5126 Other intervertebral disc displacement, lumbar region: Secondary | ICD-10-CM | POA: Diagnosis not present

## 2020-05-05 DIAGNOSIS — D1809 Hemangioma of other sites: Secondary | ICD-10-CM | POA: Diagnosis not present

## 2020-07-08 DIAGNOSIS — E781 Pure hyperglyceridemia: Secondary | ICD-10-CM | POA: Diagnosis not present

## 2020-07-08 DIAGNOSIS — E039 Hypothyroidism, unspecified: Secondary | ICD-10-CM | POA: Diagnosis not present

## 2020-07-08 DIAGNOSIS — E871 Hypo-osmolality and hyponatremia: Secondary | ICD-10-CM | POA: Diagnosis not present

## 2020-07-08 DIAGNOSIS — I10 Essential (primary) hypertension: Secondary | ICD-10-CM | POA: Diagnosis not present

## 2020-07-08 DIAGNOSIS — I2510 Atherosclerotic heart disease of native coronary artery without angina pectoris: Secondary | ICD-10-CM | POA: Diagnosis not present

## 2020-07-08 DIAGNOSIS — I2721 Secondary pulmonary arterial hypertension: Secondary | ICD-10-CM | POA: Diagnosis not present

## 2020-07-08 DIAGNOSIS — E538 Deficiency of other specified B group vitamins: Secondary | ICD-10-CM | POA: Diagnosis not present

## 2020-07-08 DIAGNOSIS — E559 Vitamin D deficiency, unspecified: Secondary | ICD-10-CM | POA: Diagnosis not present

## 2020-07-15 DIAGNOSIS — I480 Paroxysmal atrial fibrillation: Secondary | ICD-10-CM | POA: Diagnosis not present

## 2020-07-15 DIAGNOSIS — I2510 Atherosclerotic heart disease of native coronary artery without angina pectoris: Secondary | ICD-10-CM | POA: Diagnosis not present

## 2020-07-15 DIAGNOSIS — I6529 Occlusion and stenosis of unspecified carotid artery: Secondary | ICD-10-CM | POA: Diagnosis not present

## 2020-07-15 DIAGNOSIS — D692 Other nonthrombocytopenic purpura: Secondary | ICD-10-CM | POA: Diagnosis not present

## 2020-07-15 DIAGNOSIS — Z23 Encounter for immunization: Secondary | ICD-10-CM | POA: Diagnosis not present

## 2020-07-15 DIAGNOSIS — I5022 Chronic systolic (congestive) heart failure: Secondary | ICD-10-CM | POA: Diagnosis not present

## 2020-07-15 DIAGNOSIS — N182 Chronic kidney disease, stage 2 (mild): Secondary | ICD-10-CM | POA: Diagnosis not present

## 2020-07-15 DIAGNOSIS — I2721 Secondary pulmonary arterial hypertension: Secondary | ICD-10-CM | POA: Diagnosis not present

## 2020-07-15 DIAGNOSIS — Z0000 Encounter for general adult medical examination without abnormal findings: Secondary | ICD-10-CM | POA: Diagnosis not present

## 2020-07-15 DIAGNOSIS — I130 Hypertensive heart and chronic kidney disease with heart failure and stage 1 through stage 4 chronic kidney disease, or unspecified chronic kidney disease: Secondary | ICD-10-CM | POA: Diagnosis not present

## 2020-08-04 DIAGNOSIS — Z1212 Encounter for screening for malignant neoplasm of rectum: Secondary | ICD-10-CM | POA: Diagnosis not present

## 2020-11-07 DIAGNOSIS — R300 Dysuria: Secondary | ICD-10-CM | POA: Diagnosis not present

## 2020-11-07 DIAGNOSIS — N390 Urinary tract infection, site not specified: Secondary | ICD-10-CM | POA: Diagnosis not present

## 2021-01-13 DIAGNOSIS — I2510 Atherosclerotic heart disease of native coronary artery without angina pectoris: Secondary | ICD-10-CM | POA: Diagnosis not present

## 2021-01-13 DIAGNOSIS — N182 Chronic kidney disease, stage 2 (mild): Secondary | ICD-10-CM | POA: Diagnosis not present

## 2021-01-13 DIAGNOSIS — I2721 Secondary pulmonary arterial hypertension: Secondary | ICD-10-CM | POA: Diagnosis not present

## 2021-01-13 DIAGNOSIS — I429 Cardiomyopathy, unspecified: Secondary | ICD-10-CM | POA: Diagnosis not present

## 2021-01-13 DIAGNOSIS — I130 Hypertensive heart and chronic kidney disease with heart failure and stage 1 through stage 4 chronic kidney disease, or unspecified chronic kidney disease: Secondary | ICD-10-CM | POA: Diagnosis not present

## 2021-01-13 DIAGNOSIS — K56609 Unspecified intestinal obstruction, unspecified as to partial versus complete obstruction: Secondary | ICD-10-CM | POA: Diagnosis not present

## 2021-01-13 DIAGNOSIS — E039 Hypothyroidism, unspecified: Secondary | ICD-10-CM | POA: Diagnosis not present

## 2021-01-13 DIAGNOSIS — I480 Paroxysmal atrial fibrillation: Secondary | ICD-10-CM | POA: Diagnosis not present

## 2021-01-13 DIAGNOSIS — I5022 Chronic systolic (congestive) heart failure: Secondary | ICD-10-CM | POA: Diagnosis not present

## 2021-01-13 DIAGNOSIS — I700 Atherosclerosis of aorta: Secondary | ICD-10-CM | POA: Diagnosis not present

## 2021-05-25 DIAGNOSIS — Z7901 Long term (current) use of anticoagulants: Secondary | ICD-10-CM | POA: Diagnosis not present

## 2021-05-25 DIAGNOSIS — I480 Paroxysmal atrial fibrillation: Secondary | ICD-10-CM | POA: Diagnosis not present

## 2021-05-25 DIAGNOSIS — M79675 Pain in left toe(s): Secondary | ICD-10-CM | POA: Diagnosis not present

## 2021-05-25 DIAGNOSIS — M7989 Other specified soft tissue disorders: Secondary | ICD-10-CM | POA: Diagnosis not present

## 2021-05-26 DIAGNOSIS — M7989 Other specified soft tissue disorders: Secondary | ICD-10-CM | POA: Diagnosis not present

## 2021-06-03 ENCOUNTER — Other Ambulatory Visit: Payer: Self-pay

## 2021-06-03 ENCOUNTER — Ambulatory Visit: Payer: Medicare HMO | Admitting: Podiatry

## 2021-06-03 DIAGNOSIS — R601 Generalized edema: Secondary | ICD-10-CM

## 2021-06-04 ENCOUNTER — Ambulatory Visit: Payer: Medicare HMO | Admitting: Podiatry

## 2021-06-04 NOTE — Progress Notes (Signed)
Subjective:  Patient ID: Teresa Gonzalez, female    DOB: 06-Mar-1922,  MRN: [id number]  Chief Complaint  Patient presents with   Foot Pain    PT stated that she has been having some swelling in her foot no known injuries     87 y.o. female presents with the above complaint.  Patient presents with complaint of left dorsal foot swelling with no known injuries.  Patient states that it does not hurt but she started noticing swelling over the last few days it has gotten better today.  She states that it is usually worse in the morning.  She has not seen anyone else prior to seeing me.  She does not wear any compression socks or legs elevation.   Review of Systems: Negative except as noted in the HPI. Denies N/V/F/Ch.  Past Medical History:  Diagnosis Date   Atrial fibrillation ([place])    Depression    Depression    Hypothyroidism    Insomnia    Left carpal tunnel syndrome    By nerve conduction study   Peripheral neuropathy    S/P small bowel resection    with Diarrhea   SBO (small bowel obstruction) ([place]) 06/2019   Status post radiation therapy    Uterine cancer ([facility])    Vitamin D deficiency     Current Outpatient Medications:    apixaban (ELIQUIS) 2.5 MG TABS tablet, Take 2.5 mg by mouth 2 (two) times daily., Disp: , Rfl:    apixaban (ELIQUIS) 2.5 MG TABS tablet, Take 2.5 mg by mouth 2 (two) times daily., Disp: , Rfl:    cholecalciferol (VITAMIN D) 25 MCG (1000 UNIT) tablet, Take 1,000 Units by mouth daily., Disp: , Rfl:    Cholecalciferol (VITAMIN D3) 1.25 MG (50000 UT) CAPS, Take 1 capsule by mouth 4 (four) times a week., Disp: , Rfl:    furosemide (LASIX) 20 MG tablet, Take 20 mg by mouth daily., Disp: , Rfl:    gabapentin (NEURONTIN) 100 MG capsule, Take 100 mg by mouth 2 (two) times daily as needed (nerve pain)., Disp: , Rfl:    lidocaine (LIDODERM) 5 %, Place 1 patch onto the skin daily. Remove & Discard patch within 12 hours or as directed by MD, Disp: 30 patch, Rfl: 0    loperamide (IMODIUM) 2 MG capsule, Take 2 mg by mouth as needed for diarrhea or loose stools., Disp: , Rfl:    LORazepam (ATIVAN) 0.5 MG tablet, Take 0.5 mg by mouth daily as needed for anxiety., Disp: , Rfl: 0   magnesium oxide (MAG-OX) 400 (241.3 Mg) MG tablet, Take 1 tablet (400 mg total) by mouth daily., Disp: 30 tablet, Rfl: 0   meclizine (ANTIVERT) 12.5 MG tablet, Take 12.5 mg by mouth 3 (three) times daily as needed for dizziness., Disp: , Rfl:    methocarbamol (ROBAXIN) 500 MG tablet, Take 500 mg by mouth every 8 (eight) hours as needed for muscle spasms., Disp: , Rfl:    metoprolol tartrate (LOPRESSOR) 25 MG tablet, Take 1 tablet (25 mg total) by mouth 2 (two) times daily., Disp: 60 tablet, Rfl: 0   metoprolol tartrate (LOPRESSOR) 25 MG tablet, Take 25 mg by mouth 2 (two) times daily., Disp: , Rfl:    mirtazapine (REMERON) 15 MG tablet, Take 15 mg by mouth at bedtime., Disp: , Rfl:    Multiple Vitamins-Minerals (MULTIVITAMIN WITH MINERALS) tablet, Take 1 tablet by mouth daily., Disp: , Rfl:    omeprazole (PRILOSEC) 40 MG capsule, Take 40 mg  by mouth daily., Disp: , Rfl:    omeprazole (PRILOSEC) 40 MG capsule, Take 40 mg by mouth daily., Disp: , Rfl:    raloxifene (EVISTA) 60 MG tablet, Take 60 mg by mouth daily., Disp: , Rfl:    raloxifene (EVISTA) 60 MG tablet, Take 60 mg by mouth daily., Disp: , Rfl:    SYNTHROID 50 MCG tablet, Take 50 mcg by mouth daily., Disp: , Rfl: 2   SYNTHROID 50 MCG tablet, Take 50 mcg by mouth daily., Disp: , Rfl:    VITAMIN B1-B12 IJ, Inject 1 mL into the skin every 21 ( twenty-one) days. , Disp: , Rfl:    VITAMIN B1-B12 IJ, Inject 1 vial as directed every 21 ( twenty-one) days., Disp: , Rfl:    Vitamin D, Ergocalciferol, (DRISDOL) 50000 UNITS CAPS, Take 50,000 Units by mouth See admin instructions. Takes on Monday, Wednesday, Friday, Disp: , Rfl:   Social History   Tobacco Use  Smoking Status Never  Smokeless Tobacco Never    Allergies  Allergen  Reactions   Codeine Itching   Codeine Itching   Penicillins Swelling   Penicillins Itching and Swelling   Objective:  There were no vitals filed for this visit. There is no height or weight on file to calculate BMI. Constitutional Well developed. Well nourished.  Vascular Dorsalis pedis pulses palpable bilaterally. Posterior tibial pulses palpable bilaterally. Capillary refill normal to all digits.  No cyanosis or clubbing noted. Pedal hair growth normal.  Neurologic Normal speech. Oriented to person, place, and time. Epicritic sensation to light touch grossly present bilaterally.  Dermatologic Generalized swelling noted to the dorsal midfoot.  No loss area of pain noted.  Clinically unable to appreciate some osteoarthritic changes to the midfoot.  No severe exostosis noted.  No hematoma noted.  Nonpitting edema noted  Orthopedic: Normal joint ROM without pain or crepitus bilaterally. No visible deformities. No bony tenderness.   Radiographs: None Assessment:   1. Generalized edema    Plan:  Patient was evaluated and treated and all questions answered.  Left dorsal foot swelling without any unknown etiology -I explained to the patient the etiology of swelling of her swelling and various treatment options were discussed.  I ultimately discussed with her she will benefit from compression socks and elevation.  I also gave her Ace wrap to help with the compression as well.  Patient states understanding and will continue to do so if there is no improvement I will see her back again and we will discuss further options.  She states understanding  No follow-ups on file.

## 2021-07-31 DIAGNOSIS — G629 Polyneuropathy, unspecified: Secondary | ICD-10-CM | POA: Diagnosis not present

## 2021-07-31 DIAGNOSIS — R531 Weakness: Secondary | ICD-10-CM | POA: Diagnosis not present

## 2021-07-31 DIAGNOSIS — N3946 Mixed incontinence: Secondary | ICD-10-CM | POA: Diagnosis not present

## 2021-07-31 DIAGNOSIS — D692 Other nonthrombocytopenic purpura: Secondary | ICD-10-CM | POA: Diagnosis not present

## 2021-07-31 DIAGNOSIS — Z0000 Encounter for general adult medical examination without abnormal findings: Secondary | ICD-10-CM | POA: Diagnosis not present

## 2021-07-31 DIAGNOSIS — K529 Noninfective gastroenteritis and colitis, unspecified: Secondary | ICD-10-CM | POA: Diagnosis not present

## 2021-07-31 DIAGNOSIS — E46 Unspecified protein-calorie malnutrition: Secondary | ICD-10-CM | POA: Diagnosis not present

## 2021-07-31 DIAGNOSIS — R82998 Other abnormal findings in urine: Secondary | ICD-10-CM | POA: Diagnosis not present

## 2021-10-15 ENCOUNTER — Emergency Department (HOSPITAL_COMMUNITY): Payer: Medicare HMO

## 2021-10-15 ENCOUNTER — Encounter (HOSPITAL_COMMUNITY): Payer: Self-pay

## 2021-10-15 ENCOUNTER — Inpatient Hospital Stay (HOSPITAL_COMMUNITY)
Admission: EM | Admit: 2021-10-15 | Discharge: 2021-10-20 | DRG: 871 | Disposition: A | Discharge status: Home Health | Payer: Medicare HMO | Attending: Internal Medicine | Admitting: Internal Medicine

## 2021-10-15 DIAGNOSIS — L8990 Pressure ulcer of unspecified site, unspecified stage: Secondary | ICD-10-CM | POA: Insufficient documentation

## 2021-10-15 DIAGNOSIS — J690 Pneumonitis due to inhalation of food and vomit: Secondary | ICD-10-CM | POA: Diagnosis not present

## 2021-10-15 DIAGNOSIS — R54 Age-related physical debility: Secondary | ICD-10-CM | POA: Diagnosis present

## 2021-10-15 DIAGNOSIS — E46 Unspecified protein-calorie malnutrition: Secondary | ICD-10-CM | POA: Diagnosis not present

## 2021-10-15 DIAGNOSIS — Z8542 Personal history of malignant neoplasm of other parts of uterus: Secondary | ICD-10-CM | POA: Diagnosis not present

## 2021-10-15 DIAGNOSIS — R251 Tremor, unspecified: Secondary | ICD-10-CM | POA: Diagnosis present

## 2021-10-15 DIAGNOSIS — R6521 Severe sepsis with septic shock: Secondary | ICD-10-CM | POA: Diagnosis not present

## 2021-10-15 DIAGNOSIS — Z923 Personal history of irradiation: Secondary | ICD-10-CM | POA: Diagnosis not present

## 2021-10-15 DIAGNOSIS — Z66 Do not resuscitate: Secondary | ICD-10-CM | POA: Diagnosis not present

## 2021-10-15 DIAGNOSIS — G9341 Metabolic encephalopathy: Secondary | ICD-10-CM | POA: Diagnosis not present

## 2021-10-15 DIAGNOSIS — R0682 Tachypnea, not elsewhere classified: Secondary | ICD-10-CM

## 2021-10-15 DIAGNOSIS — A419 Sepsis, unspecified organism: Secondary | ICD-10-CM | POA: Diagnosis not present

## 2021-10-15 DIAGNOSIS — N179 Acute kidney failure, unspecified: Secondary | ICD-10-CM | POA: Diagnosis not present

## 2021-10-15 DIAGNOSIS — A4151 Sepsis due to Escherichia coli [E. coli]: Secondary | ICD-10-CM | POA: Diagnosis not present

## 2021-10-15 DIAGNOSIS — R069 Unspecified abnormalities of breathing: Secondary | ICD-10-CM | POA: Diagnosis not present

## 2021-10-15 DIAGNOSIS — E559 Vitamin D deficiency, unspecified: Secondary | ICD-10-CM | POA: Diagnosis present

## 2021-10-15 DIAGNOSIS — I480 Paroxysmal atrial fibrillation: Secondary | ICD-10-CM | POA: Diagnosis present

## 2021-10-15 DIAGNOSIS — Z79890 Hormone replacement therapy (postmenopausal): Secondary | ICD-10-CM

## 2021-10-15 DIAGNOSIS — Z880 Allergy status to penicillin: Secondary | ICD-10-CM | POA: Diagnosis not present

## 2021-10-15 DIAGNOSIS — N390 Urinary tract infection, site not specified: Secondary | ICD-10-CM | POA: Diagnosis not present

## 2021-10-15 DIAGNOSIS — E871 Hypo-osmolality and hyponatremia: Secondary | ICD-10-CM | POA: Diagnosis not present

## 2021-10-15 DIAGNOSIS — I10 Essential (primary) hypertension: Secondary | ICD-10-CM | POA: Diagnosis not present

## 2021-10-15 DIAGNOSIS — R000 Tachycardia, unspecified: Secondary | ICD-10-CM | POA: Diagnosis not present

## 2021-10-15 DIAGNOSIS — Z20822 Contact with and (suspected) exposure to covid-19: Secondary | ICD-10-CM | POA: Diagnosis present

## 2021-10-15 DIAGNOSIS — I499 Cardiac arrhythmia, unspecified: Secondary | ICD-10-CM | POA: Diagnosis not present

## 2021-10-15 DIAGNOSIS — J9601 Acute respiratory failure with hypoxia: Secondary | ICD-10-CM | POA: Diagnosis not present

## 2021-10-15 DIAGNOSIS — Z7901 Long term (current) use of anticoagulants: Secondary | ICD-10-CM | POA: Diagnosis not present

## 2021-10-15 DIAGNOSIS — I4891 Unspecified atrial fibrillation: Secondary | ICD-10-CM | POA: Diagnosis not present

## 2021-10-15 DIAGNOSIS — Z79899 Other long term (current) drug therapy: Secondary | ICD-10-CM

## 2021-10-15 DIAGNOSIS — Z833 Family history of diabetes mellitus: Secondary | ICD-10-CM

## 2021-10-15 DIAGNOSIS — R079 Chest pain, unspecified: Secondary | ICD-10-CM | POA: Diagnosis present

## 2021-10-15 DIAGNOSIS — Z885 Allergy status to narcotic agent status: Secondary | ICD-10-CM

## 2021-10-15 DIAGNOSIS — E039 Hypothyroidism, unspecified: Secondary | ICD-10-CM | POA: Diagnosis present

## 2021-10-15 DIAGNOSIS — I447 Left bundle-branch block, unspecified: Secondary | ICD-10-CM | POA: Diagnosis present

## 2021-10-15 DIAGNOSIS — G629 Polyneuropathy, unspecified: Secondary | ICD-10-CM | POA: Diagnosis present

## 2021-10-15 DIAGNOSIS — Z681 Body mass index (BMI) 19 or less, adult: Secondary | ICD-10-CM

## 2021-10-15 DIAGNOSIS — R0902 Hypoxemia: Secondary | ICD-10-CM | POA: Diagnosis not present

## 2021-10-15 DIAGNOSIS — Z823 Family history of stroke: Secondary | ICD-10-CM

## 2021-10-15 DIAGNOSIS — I517 Cardiomegaly: Secondary | ICD-10-CM | POA: Diagnosis not present

## 2021-10-15 DIAGNOSIS — N170 Acute kidney failure with tubular necrosis: Secondary | ICD-10-CM | POA: Diagnosis not present

## 2021-10-15 LAB — CBC WITH DIFFERENTIAL/PLATELET
Abs Immature Granulocytes: 0.06 10*3/uL (ref 0.00–0.07)
Basophils Absolute: 0 10*3/uL (ref 0.0–0.1)
Basophils Relative: 0 %
Eosinophils Absolute: 0 10*3/uL (ref 0.0–0.5)
Eosinophils Relative: 0 %
HCT: 34.9 % — ABNORMAL LOW (ref 36.0–46.0)
Hemoglobin: 11.4 g/dL — ABNORMAL LOW (ref 12.0–15.0)
Immature Granulocytes: 1 %
Lymphocytes Relative: 9 %
Lymphs Abs: 1.1 10*3/uL (ref 0.7–4.0)
MCH: 27.3 pg (ref 26.0–34.0)
MCHC: 32.7 g/dL (ref 30.0–36.0)
MCV: 83.5 fL (ref 80.0–100.0)
Monocytes Absolute: 0.4 10*3/uL (ref 0.1–1.0)
Monocytes Relative: 4 %
Neutro Abs: 10.5 10*3/uL — ABNORMAL HIGH (ref 1.7–7.7)
Neutrophils Relative %: 86 %
Platelets: 441 10*3/uL — ABNORMAL HIGH (ref 150–400)
RBC: 4.18 MIL/uL (ref 3.87–5.11)
RDW: 17.8 % — ABNORMAL HIGH (ref 11.5–15.5)
WBC: 12.1 10*3/uL — ABNORMAL HIGH (ref 4.0–10.5)
nRBC: 0 % (ref 0.0–0.2)

## 2021-10-15 LAB — RESP PANEL BY RT-PCR (FLU A&B, COVID) ARPGX2
Influenza A by PCR: NEGATIVE
Influenza B by PCR: NEGATIVE
SARS Coronavirus 2 by RT PCR: NEGATIVE

## 2021-10-15 LAB — COMPREHENSIVE METABOLIC PANEL
ALT: 11 U/L (ref 0–44)
AST: 21 U/L (ref 15–41)
Albumin: 2.6 g/dL — ABNORMAL LOW (ref 3.5–5.0)
Alkaline Phosphatase: 61 U/L (ref 38–126)
Anion gap: 7 (ref 5–15)
BUN: 12 mg/dL (ref 8–23)
CO2: 21 mmol/L — ABNORMAL LOW (ref 22–32)
Calcium: 7.8 mg/dL — ABNORMAL LOW (ref 8.9–10.3)
Chloride: 107 mmol/L (ref 98–111)
Creatinine, Ser: 0.87 mg/dL (ref 0.44–1.00)
GFR, Estimated: >60 mL/min (ref >60)
Glucose, Bld: 97 mg/dL (ref 70–99)
Potassium: 3.8 mmol/L (ref 3.5–5.1)
Sodium: 135 mmol/L (ref 135–145)
Total Bilirubin: 0.5 mg/dL (ref 0.3–1.2)
Total Protein: 5.3 g/dL — ABNORMAL LOW (ref 6.5–8.1)

## 2021-10-15 LAB — T4, FREE: Free T4: 1.08 ng/dL (ref 0.61–1.12)

## 2021-10-15 LAB — TROPONIN I (HIGH SENSITIVITY)
Troponin I (High Sensitivity): 70 ng/L — ABNORMAL HIGH (ref <18)
Troponin I (High Sensitivity): 136 ng/L (ref <18)
Troponin I (High Sensitivity): 141 ng/L (ref <18)

## 2021-10-15 MED ORDER — BENZONATATE 100 MG PO CAPS: 200.0000 mg | ORAL_CAPSULE | Freq: Three times a day (TID) | ORAL | Status: DC | PRN

## 2021-10-15 MED ORDER — LACTATED RINGERS IV BOLUS
1000.0000 mL | Freq: Once | INTRAVENOUS | Status: AC
  Administered 2021-10-15: 1000 mL via INTRAVENOUS

## 2021-10-15 MED ORDER — LOPERAMIDE HCL 2 MG PO CAPS
2.0000 mg | ORAL_CAPSULE | Freq: Three times a day (TID) | ORAL | Status: DC
  Administered 2021-10-16 – 2021-10-17 (×5): 2 mg via ORAL
  Filled 2021-10-15 (×4): qty 1

## 2021-10-15 MED ORDER — MECLIZINE HCL 25 MG PO TABS: 12.5000 mg | ORAL_TABLET | Freq: Three times a day (TID) | ORAL | Status: DC | PRN

## 2021-10-15 MED ORDER — PANTOPRAZOLE SODIUM 40 MG PO TBEC
40.0000 mg | DELAYED_RELEASE_TABLET | Freq: Every day | ORAL | Status: DC
  Administered 2021-10-16 – 2021-10-17 (×2): 40 mg via ORAL
  Filled 2021-10-15 (×2): qty 1

## 2021-10-15 MED ORDER — MAGNESIUM OXIDE 400 (241.3 MG) MG PO TABS: 400.0000 mg | ORAL_TABLET | Freq: Two times a day (BID) | ORAL | Status: DC

## 2021-10-15 MED ORDER — RALOXIFENE HCL 60 MG PO TABS
60.0000 mg | ORAL_TABLET | Freq: Every day | ORAL | Status: DC
  Administered 2021-10-16 – 2021-10-20 (×5): 60 mg via ORAL
  Filled 2021-10-15 (×5): qty 1

## 2021-10-15 MED ORDER — LEVOTHYROXINE SODIUM 50 MCG PO TABS
50.0000 ug | ORAL_TABLET | Freq: Every day | ORAL | Status: DC
  Administered 2021-10-16 – 2021-10-20 (×4): 50 ug via ORAL
  Filled 2021-10-15 (×3): qty 1
  Filled 2021-10-15: qty 2

## 2021-10-15 MED ORDER — GUAIFENESIN 100 MG/5ML PO LIQD
20.0000 mL | Freq: Two times a day (BID) | ORAL | Status: DC | PRN
  Filled 2021-10-15: qty 20

## 2021-10-15 MED ORDER — ACETAMINOPHEN 500 MG PO TABS
1000.0000 mg | ORAL_TABLET | Freq: Three times a day (TID) | ORAL | Status: DC | PRN
  Administered 2021-10-18 – 2021-10-20 (×4): 1000 mg via ORAL
  Filled 2021-10-15 (×4): qty 2

## 2021-10-15 MED ORDER — METOPROLOL TARTRATE 25 MG PO TABS
25.0000 mg | ORAL_TABLET | Freq: Four times a day (QID) | ORAL | Status: DC
  Administered 2021-10-16: 25 mg via ORAL
  Filled 2021-10-15 (×2): qty 1

## 2021-10-15 MED ORDER — RIVAROXABAN 10 MG PO TABS
10.0000 mg | ORAL_TABLET | Freq: Every day | ORAL | Status: DC
  Administered 2021-10-16: 10 mg via ORAL
  Filled 2021-10-15 (×2): qty 1

## 2021-10-15 MED ORDER — METOPROLOL TARTRATE 5 MG/5ML IV SOLN
5.0000 mg | Freq: Once | INTRAVENOUS | Status: AC
  Administered 2021-10-15: 5 mg via INTRAVENOUS
  Filled 2021-10-15: qty 5

## 2021-10-15 MED ORDER — METOPROLOL TARTRATE 25 MG PO TABS
25.0000 mg | ORAL_TABLET | Freq: Once | ORAL | Status: AC
  Administered 2021-10-15: 25 mg via ORAL
  Filled 2021-10-15: qty 1

## 2021-10-15 MED ORDER — MULTI-VITAMIN/MINERALS PO TABS: 1.0000 | ORAL_TABLET | Freq: Every day | ORAL | Status: DC

## 2021-10-15 NOTE — ED Notes (Signed)
Cardiologist at bedside.  

## 2021-10-15 NOTE — ED Provider Notes (Signed)
[facility] EMERGENCY DEPARTMENT Provider Note   CSN: [id number] Arrival date & time: 10/15/21  1526     History  Chief Complaint  Patient presents with   Irregular Heart Beat    Teresa Gonzalez is a 88 y.o. female with past medical history significant for paroxysmal atrial fibrillation on Eliquis, depression, hypothyroidism, uterine cancer s/p radiation therapy, s/p small bowel resection who presents with tachycardia.  The patient lives alone with home health nursing help.  The home health nurse noticed her heart rate was in the 150s.  At that time, the patient had a little bit of shortness of breath.  Patient was brought to emergency department for further evaluation.  Currently she says she feels "jerky" but denies significant shortness of breath or chest pain.  Patient does states she has had slight cough for the past couple of weeks that is improving.  She denies any fevers at home.  Her home health nurses assist with her medications and she denies any chance that she could have missed one.    Home Medications Prior to Admission medications   Medication Sig Start Date End Date Taking? Authorizing Provider  Multiple Vitamins-Minerals (MULTIVITAMIN WITH MINERALS) tablet Take 1 tablet by mouth daily.   Yes [provider]  omeprazole (PRILOSEC) 40 MG capsule Take 40 mg by mouth daily. 09/04/19  Yes [provider]  SYNTHROID 50 MCG tablet Take 50 mcg by mouth daily. 02/04/20  Yes [provider]  VITAMIN B1-B12 IJ Inject 1 vial as directed every 21 ( twenty-one) days.   Yes [provider]  Vitamin D, Ergocalciferol, (DRISDOL) 50000 UNITS CAPS Take 50,000 Units by mouth See admin instructions. Takes on Monday, Wednesday, Friday   Yes [provider]  apixaban (ELIQUIS) 2.5 MG TABS tablet Take 2.5 mg by mouth 2 (two) times daily.    [provider]  cholecalciferol (VITAMIN D) 25 MCG (1000 UNIT) tablet Take 1,000 Units by  mouth daily.    [provider]  Cholecalciferol (VITAMIN D3) 1.25 MG (50000 UT) CAPS Take 1 capsule by mouth 4 (four) times a week. 02/19/20   [provider]  furosemide (LASIX) 20 MG tablet Take 20 mg by mouth daily.    [provider]  gabapentin (NEURONTIN) 100 MG capsule Take 100 mg by mouth 2 (two) times daily as needed (nerve pain).    [provider]  lidocaine (LIDODERM) 5 % Place 1 patch onto the skin daily. Remove & Discard patch within 12 hours or as directed by MD 03/15/20   Malvin Johns, MD  loperamide (IMODIUM) 2 MG capsule Take 2 mg by mouth as needed for diarrhea or loose stools.    [provider]  LORazepam (ATIVAN) 0.5 MG tablet Take 0.5 mg by mouth daily as needed for anxiety. 01/26/17   [provider]  magnesium oxide (MAG-OX) 400 (241.3 Mg) MG tablet Take 1 tablet (400 mg total) by mouth daily. 04/01/17   Nita Sells, MD  meclizine (ANTIVERT) 12.5 MG tablet Take 12.5 mg by mouth 3 (three) times daily as needed for dizziness.    [provider]  methocarbamol (ROBAXIN) 500 MG tablet Take 500 mg by mouth every 8 (eight) hours as needed for muscle spasms.    [provider]  metoprolol tartrate (LOPRESSOR) 25 MG tablet Take 1 tablet (25 mg total) by mouth 2 (two) times daily. 08/06/15   Cherene Altes, MD  mirtazapine (REMERON) 15 MG tablet Take 15 mg  by mouth at bedtime.    [provider]  raloxifene (EVISTA) 60 MG tablet Take 60 mg by mouth daily. 02/18/20   [provider]      Allergies    Codeine, Codeine, Penicillins, and Penicillins    Review of Systems   Review of Systems  Constitutional:  Negative for fever.  HENT:  Positive for congestion.   Respiratory:  Positive for cough. Negative for shortness of breath.   Cardiovascular:  Negative for chest pain and palpitations.  Gastrointestinal:  Negative for abdominal pain, constipation, diarrhea, nausea and vomiting.   Neurological:  Negative for light-headedness, numbness and headaches.   Physical Exam Updated Vital Signs BP (!) 144/79    Pulse (!) 125    Temp 98.2 F (36.8 C) (Oral)    Resp (!) 34    SpO2 90%   Physical Exam Vitals and nursing note reviewed.  Constitutional:      General: She is not in acute distress.    Appearance: She is well-developed.  HENT:     Head: Normocephalic and atraumatic.     Right Ear: External ear normal.     Left Ear: External ear normal.     Nose: Nose normal.     Mouth/Throat:     Pharynx: Oropharynx is clear.  Eyes:     Extraocular Movements: Extraocular movements intact.     Conjunctiva/sclera: Conjunctivae normal.     Pupils: Pupils are equal, round, and reactive to light.  Cardiovascular:     Rate and Rhythm: Tachycardia present. Rhythm irregularly irregular.     Pulses:          Radial pulses are 2+ on the right side and 2+ on the left side.     Heart sounds: No murmur heard. Pulmonary:     Effort: Pulmonary effort is normal. No respiratory distress.     Breath sounds: Normal breath sounds.  Abdominal:     Palpations: Abdomen is soft.     Tenderness: There is no abdominal tenderness.  Musculoskeletal:        General: No swelling.     Cervical back: Neck supple.  Skin:    General: Skin is warm and dry.     Capillary Refill: Capillary refill takes less than 2 seconds.  Neurological:     General: No focal deficit present.     Mental Status: She is alert and oriented to person, place, and time.  Psychiatric:        Mood and Affect: Mood normal.    ED Results / Procedures / Treatments   Labs (all labs ordered are listed, but only abnormal results are displayed) Labs Reviewed  CBC WITH DIFFERENTIAL/PLATELET - Abnormal; Notable for the following components:      Result Value   WBC 12.1 (*)    Hemoglobin 11.4 (*)    HCT 34.9 (*)    RDW 17.8 (*)    Platelets 441 (*)    Neutro Abs 10.5 (*)    All other components within normal limits   COMPREHENSIVE METABOLIC PANEL - Abnormal; Notable for the following components:   CO2 21 (*)    Calcium 7.8 (*)    Total Protein 5.3 (*)    Albumin 2.6 (*)    All other components within normal limits  APTT - Abnormal; Notable for the following components:   aPTT 66 (*)    All other components within normal limits  TROPONIN I (HIGH SENSITIVITY) - Abnormal; Notable for the following components:  Troponin I (High Sensitivity) 70 (*)    All other components within normal limits  TROPONIN I (HIGH SENSITIVITY) - Abnormal; Notable for the following components:   Troponin I (High Sensitivity) 136 (*)    All other components within normal limits  TROPONIN I (HIGH SENSITIVITY) - Abnormal; Notable for the following components:   Troponin I (High Sensitivity) 141 (*)    All other components within normal limits  RESP PANEL BY RT-PCR (FLU A&B, COVID) ARPGX2  T4, FREE  TSH  TSH  MAGNESIUM    EKG None  Radiology DG Chest Portable 1 View  Result Date: 10/15/2021 CLINICAL DATA:  Tachycardia, arrhythmia EXAM: PORTABLE CHEST 1 VIEW COMPARISON:  Portable exam 1615 hours compared to 10/14/2019 FINDINGS: Normal heart size, mediastinal contours, and pulmonary vascularity. Atherosclerotic calcification aorta. Bronchitic changes with chronic accentuation of medial RIGHT basilar markings. No acute infiltrate, pleural effusion, or pneumothorax. Bones demineralized with thoracolumbar scoliosis. IMPRESSION: Bronchitic changes with chronic accentuation of RIGHT infrahilar markings. No acute abnormality Electronically Signed   By: Lavonia Dana M.D.   On: 10/15/2021 16:21    Procedures Procedures   Medications Ordered in ED Medications  lactated ringers bolus 1,000 mL (0 mLs Intravenous Stopped 10/15/21 1648)  metoprolol tartrate (LOPRESSOR) injection 5 mg (5 mg Intravenous Given 10/15/21 1612)  metoprolol tartrate (LOPRESSOR) tablet 25 mg (25 mg Oral Given 10/15/21 1612)    ED Course/ Medical Decision  Making/ A&P                           Patient presents in atrial fibrillation with rapid ventricular response as described in HPI above.  Patient has a history of paroxysmal atrial fibrillation and is currently anticoagulated.  She denies any chance of missing doses of her medication as she has home health nursing who administers her medications every day.  She has had a mild cough and congestion over the past couple weeks has been improving.  Patient possibly pushed into atrial fibrillation by viral illness although she has been afebrile and states that her symptoms been improving.  She does acknowledge some mild chest discomfort.  Basic labs, troponin, thyroid studies ordered to further evaluate for etiology of atrial fibrillation.  Patient was given fluids, IV metoprolol 5 mg, and home 25 mg metoprolol tartrate for rate control.  Patient's rate slowed to the low 100s after metoprolol IV and p.o. as well as fluids.  Initial troponin mildly elevated at 70.  This may be all tachycardia mediated troponinemia in the setting of atrial fibrillation with RVR, however given mild chest pain on presentation, we will obtain delta troponin.  Patient states that her chest discomfort has resolved now that her rate has normalized.  On reevaluation, patient's heart rate is in the 80s to 90s.  Repeat EKG demonstrates normal sinus rhythm with no evidence of acute ischemia or arrhythmia.  Repeat troponin increased to 136.  This still may all be tachycardia driven especially since the patient symptoms are improving, however given increase in troponin I discussed the patient with cardiology who will come see the patient.  They recommend obtaining a third troponin.  Patient's third troponin is stably elevated at 141.  I discussed the patient again with cardiology who had performed bedside echocardiogram that did not demonstrate any wall motion abnormalities indicative of MI.  Given unclear etiology of patient's episode of A.  fib with RVR and troponinemia, cardiology believes it would be prudent to observe the patient  overnight.  Cardiology will plan to admit the patient to their service.   Final Clinical Impression(s) / ED Diagnoses Final diagnoses:  Paroxysmal atrial fibrillation [facility])    Rx / DC Orders ED Discharge Orders     None         Roza Creamer, Amalia Hailey, MD 10/16/21 1134    Lucrezia Starch, MD 10/16/21 2350

## 2021-10-15 NOTE — ED Triage Notes (Signed)
Lives at home with caregivers who noted patient with SOB and weakness, EMS found A-fib RVR on monitor and gave 2 separate 5 mg doses of cardizem , but still noted HR 140's. Also noted 2 nonsustained episodes of VT.

## 2021-10-15 NOTE — ED Notes (Signed)
ED Provider at bedside. 

## 2021-10-16 ENCOUNTER — Observation Stay (HOSPITAL_COMMUNITY): Payer: Medicare HMO

## 2021-10-16 ENCOUNTER — Encounter (HOSPITAL_COMMUNITY): Payer: Self-pay | Admitting: Student in an Organized Health Care Education/Training Program

## 2021-10-16 ENCOUNTER — Other Ambulatory Visit: Payer: Self-pay

## 2021-10-16 DIAGNOSIS — I4891 Unspecified atrial fibrillation: Secondary | ICD-10-CM

## 2021-10-16 DIAGNOSIS — E039 Hypothyroidism, unspecified: Secondary | ICD-10-CM

## 2021-10-16 DIAGNOSIS — Z7901 Long term (current) use of anticoagulants: Secondary | ICD-10-CM

## 2021-10-16 DIAGNOSIS — I480 Paroxysmal atrial fibrillation: Secondary | ICD-10-CM

## 2021-10-16 LAB — BASIC METABOLIC PANEL
Anion gap: 10 (ref 5–15)
BUN: 15 mg/dL (ref 8–23)
CO2: 25 mmol/L (ref 22–32)
Calcium: 7.7 mg/dL — ABNORMAL LOW (ref 8.9–10.3)
Chloride: 106 mmol/L (ref 98–111)
Creatinine, Ser: 0.87 mg/dL (ref 0.44–1.00)
GFR, Estimated: >60 mL/min (ref >60)
Glucose, Bld: 103 mg/dL — ABNORMAL HIGH (ref 70–99)
Potassium: 3.7 mmol/L (ref 3.5–5.1)
Sodium: 141 mmol/L (ref 135–145)

## 2021-10-16 LAB — ECHOCARDIOGRAM COMPLETE
AR max vel: 2.21 cm2
AV Area VTI: 2.1 cm2
AV Area mean vel: 2.06 cm2
AV Mean grad: 6 mmHg
AV Peak grad: 11.2 mmHg
Ao pk vel: 1.67 m/s
Area-P 1/2: 3.99 cm2
Height: 64 in
S' Lateral: 2.7 cm
Weight: 1600 oz

## 2021-10-16 LAB — PROTIME-INR
INR: >10 (ref 0.8–1.2)
Prothrombin Time: >90 seconds — ABNORMAL HIGH (ref 11.4–15.2)

## 2021-10-16 LAB — APTT: aPTT: 66 seconds — ABNORMAL HIGH (ref 24–36)

## 2021-10-16 LAB — MAGNESIUM: Magnesium: 1 mg/dL — ABNORMAL LOW (ref 1.7–2.4)

## 2021-10-16 MED ORDER — ADULT MULTIVITAMIN W/MINERALS CH
1.0000 | ORAL_TABLET | Freq: Every day | ORAL | Status: DC
  Administered 2021-10-16 – 2021-10-20 (×5): 1 via ORAL
  Filled 2021-10-16 (×5): qty 1

## 2021-10-16 MED ORDER — MAGNESIUM OXIDE -MG SUPPLEMENT 400 (240 MG) MG PO TABS
400.0000 mg | ORAL_TABLET | Freq: Two times a day (BID) | ORAL | Status: DC
  Administered 2021-10-16 – 2021-10-17 (×4): 400 mg via ORAL
  Filled 2021-10-16 (×4): qty 1

## 2021-10-16 MED ORDER — METOPROLOL TARTRATE 25 MG PO TABS: 25.0000 mg | ORAL_TABLET | Freq: Two times a day (BID) | ORAL | Status: DC

## 2021-10-16 MED ORDER — PHYTONADIONE 5 MG PO TABS
10.0000 mg | ORAL_TABLET | Freq: Once | ORAL | Status: AC
  Administered 2021-10-17: 10 mg via ORAL
  Filled 2021-10-16: qty 2

## 2021-10-16 MED ORDER — METOPROLOL TARTRATE 50 MG PO TABS
50.0000 mg | ORAL_TABLET | Freq: Two times a day (BID) | ORAL | Status: DC
  Administered 2021-10-16 – 2021-10-17 (×3): 50 mg via ORAL
  Filled 2021-10-16 (×2): qty 1
  Filled 2021-10-16: qty 2

## 2021-10-16 NOTE — ED Notes (Signed)
Assisted to bed pan. Soft brown BM noted.

## 2021-10-16 NOTE — Progress Notes (Signed)
Echocardiogram 2D Echocardiogram has been performed.  Teresa Gonzalez 10/16/2021, 9:46 AM

## 2021-10-16 NOTE — ED Notes (Signed)
Alert. No signs of distress. No complaints. Purewick in place to lws. Visitor sitting at bedside. Call bell in reach, side rails up, bed in low position.

## 2021-10-16 NOTE — ED Notes (Signed)
MD Conley Canal notified about critical PT INR labs.

## 2021-10-16 NOTE — Progress Notes (Signed)
Progress Note  Patient Name: Teresa Gonzalez Date of Encounter: 10/16/2021  [facility] Cardiologist: new  Subjective   Feeling better today but still with cough.   Inpatient Medications    Scheduled Meds:  levothyroxine  50 mcg Oral QAC breakfast   loperamide  2 mg Oral TID   magnesium oxide  400 mg Oral BID   metoprolol tartrate  25 mg Oral BID   multivitamin with minerals  1 tablet Oral Daily   pantoprazole  40 mg Oral Daily   raloxifene  60 mg Oral Daily   rivaroxaban  10 mg Oral Daily   Continuous Infusions:  PRN Meds: acetaminophen, benzonatate, guaiFENesin, meclizine   Vital Signs    Vitals:   10/16/21 0445 10/16/21 0500 10/16/21 0800 10/16/21 0805  BP: 127/75 122/79 135/65   Pulse: 82 88 (!) 101   Resp: (!) 23 16 19    Temp:   98 F (36.7 C)   TempSrc:      SpO2: 96% 98% 94%   Weight:    45.4 kg  Height:    5\' 4"  (1.626 m)    Intake/Output Summary (Last 24 hours) at 10/16/2021 0814 Last data filed at 10/15/2021 1648 Gross per 24 hour  Intake 749.25 ml  Output --  Net 749.25 ml   Last 3 Weights 10/16/2021 03/14/2020 10/15/2019  Weight (lbs) 100 lb 100 lb 100 lb 9.6 oz  Weight (kg) 45.36 kg 45.36 kg 45.632 kg      Telemetry    Sinus tachycardia, rate 110-120 bpm - Personally Reviewed  ECG    No am EKG  Physical Exam   GEN: No acute distress.   Neck: No JVD Cardiac: Regular, tachy, no murmurs, rubs, or gallops.  Respiratory: Clear to auscultation bilaterally. GI: Soft, nontender, non-distended  MS: No edema; No deformity. Neuro:  Nonfocal  Psych: Normal affect   Labs    High Sensitivity Troponin:   Recent Labs  Lab 10/15/21 1654 10/15/21 1944 10/15/21 2215  TROPONINIHS 70* 136* 141*     Chemistry Recent Labs  Lab 10/15/21 1645 10/16/21 0554  NA 135 141  K 3.8 3.7  CL 107 106  CO2 21* 25  GLUCOSE 97 103*  BUN 12 15  CREATININE 0.87 0.87  CALCIUM 7.8* 7.7*  MG  --  1.0*  PROT 5.3*  --   ALBUMIN 2.6*  --   AST 21  --   ALT  11  --   ALKPHOS 61  --   BILITOT 0.5  --   GFRNONAA >60 >60  ANIONGAP 7 10    Lipids No results for input(s): CHOL, TRIG, HDL, LABVLDL, LDLCALC, CHOLHDL in the last 168 hours.  Hematology Recent Labs  Lab 10/15/21 1602  WBC 12.1*  RBC 4.18  HGB 11.4*  HCT 34.9*  MCV 83.5  MCH 27.3  MCHC 32.7  RDW 17.8*  PLT 441*   Thyroid  Recent Labs  Lab 10/15/21 1645  FREET4 1.08    BNPNo results for input(s): BNP, PROBNP in the last 168 hours.  DDimer No results for input(s): DDIMER in the last 168 hours.   Radiology    DG Chest Portable 1 View  Result Date: 10/15/2021 CLINICAL DATA:  Tachycardia, arrhythmia EXAM: PORTABLE CHEST 1 VIEW COMPARISON:  Portable exam 1615 hours compared to 10/14/2019 FINDINGS: Normal heart size, mediastinal contours, and pulmonary vascularity. Atherosclerotic calcification aorta. Bronchitic changes with chronic accentuation of medial RIGHT basilar markings. No acute infiltrate, pleural effusion, or pneumothorax.  Bones demineralized with thoracolumbar scoliosis. IMPRESSION: Bronchitic changes with chronic accentuation of RIGHT infrahilar markings. No acute abnormality Electronically Signed   By: Lavonia Dana M.D.   On: 10/15/2021 16:21    Cardiac Studies     Patient Profile     88 y.o. female with long standing PAF on Xarelto admitted with recurrent atrial fib with RVR. Recent upper respiratory illness with residual cough.   Assessment & Plan    Atrial fib with RVR: Sinus tachycardia this am. She is recovering from a recent viral upper respiratory illness which likley precipitated her recurrent atrial fib. Will increase her metoprolol to 50 mg po BID. Continue Xarelto. TSH  pending. Echo pending. Will monitor on telemetry today. Of note, chest x-ray without infiltrates.   For questions or updates, please contact [place] Please consult [email or web address] for contact info under        Signed, Lauree Chandler, MD  10/16/2021, 8:14 AM

## 2021-10-16 NOTE — Progress Notes (Signed)
Spoke with Dr. Angelena Form regarding persistently elevated INR >10, Dr. Angelena Form recommended hematology consult. Patient has been taking Xarelto. Not on heparin. Not on coumadin. No active bleeding.   Spoke with Dr. Burr Medico of hematology who will see.

## 2021-10-16 NOTE — ED Notes (Signed)
Sitting up eating lunch. Tolerated well. No complaints. VSS. Family member at bedside.

## 2021-10-16 NOTE — Consult Note (Addendum)
[place]  [phone] [phone]    [place]  Referring MD:  Dr. Simeon Craft  Reason for Referral: Supratherapeutic PT/INR and PTT   HPI: Teresa Gonzalez is a 88 year old female with a past medical history significant for PAF (on Xarelto), hypothyroidism, SBO and partial small bowel obstruction, remote history of uterine cancer.  She was sent to the emergency department by her home health nurse who noted that her heart rate was in the 150s.  She was also having some shortness of breath and cough.  It was thought that her viral illness had caused A. fib with RVR.  She was given fluids and metoprolol.  Admitted to the cardiology service.  Admission lab work showed WBC of 12.1, hemoglobin 11.4, platelet count 441,000, ANC 10.5, calcium 7.8, total protein 5.3, albumin 2.6, PT >90 and INR >10.  PTT was 66.  T bili and LFTs normal.  Chest x-ray showed bronchitic changes with chronic accentuation of the right infrahilar markings and no acute abnormality.  Review of prior labs in our system show that the patient has had an elevated PTT in the past but at the time, she was on heparin.  Baseline PTT prior to initiating heparin was normal.  The only other PT/INR available to me was on 10/14/2019 and at that time her PTT was 34.6 and INR 3.4.  She was admitted to the hospital on that date for a small bowel obstruction and was on Xarelto at the time.  The patient was seen in the emergency department.  She reports that she has been taking Xarelto for atrial fibrillation.  She has not had any bleeding such as epistaxis, hemoptysis, hematemesis, hematuria, melena, hematochezia.  She reports that she has had a viral illness recently with upper respiratory symptoms.  She has a nonproductive cough.  She reports mild shortness of breath but that has improved since admission.  She denies chest pain.  Denies abdominal pain, nausea, vomiting.  The patient  states that she only takes prescribed medications and occasionally takes Tylenol but she is not taking any herbal supplements.  Current dose of Xarelto 10 mg daily. She currently lives in independent living and has caregivers around-the-clock.  He has 2 children.  Denies history of alcohol and tobacco use.  Hematology was asked to see the patient to make recommendations regarding her supratherapeutic PT/INR and PTT.  Past Medical History:  Diagnosis Date   Atrial fibrillation ([place])    Depression    Depression    Hypothyroidism    Insomnia    Left carpal tunnel syndrome    By nerve conduction study   Peripheral neuropathy    S/P small bowel resection    with Diarrhea   SBO (small bowel obstruction) ([place]) 06/2019   Status post radiation therapy    Uterine cancer ([facility])    Vitamin D deficiency   :     Past Surgical History:  Procedure Laterality Date   ABDOMINAL HYSTERECTOMY     CATARACT EXTRACTION Bilateral   :   CURRENT MEDS: Current Facility-Administered Medications  Medication Dose Route Frequency Provider Last Rate Last Admin   acetaminophen (TYLENOL) tablet 1,000 mg  1,000 mg Oral Q8H PRN Hershal Coria, MD       benzonatate (TESSALON) capsule 200 mg  200 mg Oral TID PRN Hershal Coria, MD       guaiFENesin (ROBITUSSIN) 100 MG/5ML liquid 20 mL  20 mL Oral Q12H PRN Hershal Coria, MD  levothyroxine (SYNTHROID) tablet 50 mcg  50 mcg Oral QAC breakfast Hershal Coria, MD   50 mcg at 10/16/21 0539   loperamide (IMODIUM) capsule 2 mg  2 mg Oral TID Hershal Coria, MD   2 mg at 10/16/21 1040   magnesium oxide (MAG-OX) tablet 400 mg  400 mg Oral BID Chandrasekhar, Mahesh A, MD   400 mg at 10/16/21 1040   meclizine (ANTIVERT) tablet 12.5 mg  12.5 mg Oral TID PRN Hershal Coria, MD       metoprolol tartrate (LOPRESSOR) tablet 50 mg  50 mg Oral BID Burnell Blanks, MD   50 mg at 10/16/21 1040   multivitamin with minerals tablet 1 tablet  1 tablet Oral Daily  Chandrasekhar, Mahesh A, MD   1 tablet at 10/16/21 1040   pantoprazole (PROTONIX) EC tablet 40 mg  40 mg Oral Daily Hershal Coria, MD   40 mg at 10/16/21 1040   raloxifene (EVISTA) tablet 60 mg  60 mg Oral Daily Hershal Coria, MD       rivaroxaban Alveda Reasons) tablet 10 mg  10 mg Oral Daily Hershal Coria, MD   10 mg at 10/16/21 1040   Current Outpatient Medications  Medication Sig Dispense Refill   acetaminophen (TYLENOL) 500 MG tablet Take 1,000 mg by mouth every 6 (six) hours as needed for mild pain or moderate pain.     benzonatate (TESSALON) 200 MG capsule Take 200 mg by mouth 3 (three) times daily as needed for cough.     furosemide (LASIX) 20 MG tablet Take 20 mg by mouth daily as needed for fluid.     guaiFENesin (ROBITUSSIN) 100 MG/5ML liquid Take 20 mLs by mouth every 12 (twelve) hours as needed for cough or to loosen phlegm.     loperamide (IMODIUM) 2 MG capsule Take 2 mg by mouth 3 (three) times daily.     magnesium oxide (MAG-OX) 400 (241.3 Mg) MG tablet Take 1 tablet (400 mg total) by mouth daily. (Patient taking differently: Take 400 mg by mouth 2 (two) times daily.) 30 tablet 0   meclizine (ANTIVERT) 12.5 MG tablet Take 12.5 mg by mouth 3 (three) times daily as needed for dizziness.     metoprolol tartrate (LOPRESSOR) 25 MG tablet Take 1 tablet (25 mg total) by mouth 2 (two) times daily. 60 tablet 0   Multiple Vitamins-Minerals (MULTIVITAMIN WITH MINERALS) tablet Take 1 tablet by mouth daily.     omeprazole (PRILOSEC) 40 MG capsule Take 40 mg by mouth daily.     raloxifene (EVISTA) 60 MG tablet Take 60 mg by mouth daily.     SYNTHROID 50 MCG tablet Take 50 mcg by mouth daily.     VITAMIN B1-B12 IJ Inject 1 vial as directed every 21 ( twenty-one) days.     Vitamin D, Ergocalciferol, (DRISDOL) 50000 UNITS CAPS Take 50,000 Units by mouth See admin instructions. Takes on Sunday, Monday, Wednesday, Friday     XARELTO 10 MG TABS tablet Take 10 mg by mouth daily.          Allergies  Allergen Reactions   Codeine Itching   Codeine Itching   Penicillins Swelling   Penicillins Itching and Swelling  :   Family History  Problem Relation Age of Onset   Stroke Mother    Aneurysm Father    Diabetes Brother   :   Social History   Socioeconomic History   Marital status: Married    Spouse name: Not on file   Number of  children: 2   Years of education: 16   Highest education level: Not on file  Occupational History   Occupation: Retired Marine scientist  Tobacco Use   Smoking status: Never   Smokeless tobacco: Never  Vaping Use   Vaping Use: Never used  Substance and Sexual Activity   Alcohol use: No   Drug use: No   Sexual activity: Not on file  Other Topics Concern   Not on file  Social History Narrative   ** Merged History Encounter **       Lives in retirement home with Husband 57 yrs old. 2 children.    Social Determinants of Health   Financial Resource Strain: Not on file  Food Insecurity: Not on file  Transportation Needs: Not on file  Physical Activity: Not on file  Stress: Not on file  Social Connections: Not on file  Intimate Partner Violence: Not on file  :  REVIEW OF SYSTEMS:  A comprehensive 14 point review of systems was negative except as noted in the HPI.    Exam: Patient Vitals for the past 24 hrs:  BP Temp Temp src Pulse Resp SpO2 Height Weight  10/16/21 1000 112/69 -- -- 87 20 93 % -- --  10/16/21 0805 -- -- -- -- -- -- 5\' 4"  (1.626 m) 45.4 kg  10/16/21 0800 135/65 98 F (36.7 C) -- (!) 101 19 94 % -- --  10/16/21 0500 122/79 -- -- 88 16 98 % -- --  10/16/21 0445 127/75 -- -- 82 (!) 23 96 % -- --  10/16/21 0430 128/72 -- -- 78 (!) 21 95 % -- --  10/16/21 0415 130/74 -- -- 76 (!) 23 96 % -- --  10/16/21 0400 121/64 -- -- 74 (!) 25 93 % -- --  10/16/21 0345 126/72 -- -- 79 (!) 26 95 % -- --  10/16/21 0330 124/74 -- -- 89 17 96 % -- --  10/16/21 0315 (!) 110/52 -- -- 81 19 95 % -- --  10/16/21 0300 (!) 105/59  -- -- 78 18 95 % -- --  10/16/21 0230 (!) 113/57 -- -- 80 19 94 % -- --  10/16/21 0130 (!) 130/92 -- -- 84 (!) 25 97 % -- --  10/16/21 0100 109/64 -- -- 78 19 95 % -- --  10/16/21 0030 130/75 -- -- 87 16 99 % -- --  10/15/21 2345 135/80 -- -- 89 (!) 22 97 % -- --  10/15/21 2300 126/74 -- -- 85 19 97 % -- --  10/15/21 2245 133/74 -- -- 83 20 99 % -- --  10/15/21 2230 126/66 -- -- 84 (!) 23 99 % -- --  10/15/21 2215 123/62 -- -- 83 15 97 % -- --  10/15/21 2145 139/74 -- -- (!) 103 15 97 % -- --  10/15/21 2130 136/72 -- -- 85 20 96 % -- --  10/15/21 2100 121/69 -- -- 84 (!) 24 96 % -- --  10/15/21 2030 125/77 -- -- 90 20 97 % -- --  10/15/21 2000 108/66 -- -- (!) 111 (!) 0 95 % -- --  10/15/21 1930 122/68 -- -- 92 11 95 % -- --  10/15/21 1900 121/67 -- -- 98 (!) 25 95 % -- --  10/15/21 1830 126/68 -- -- 100 18 94 % -- --  10/15/21 1800 136/80 -- -- (!) 123 20 94 % -- --  10/15/21 1730 129/80 -- -- (!) 101 (!) 27 92 % -- --  10/15/21 1715 (!) 144/79 -- -- (!) 125 (!) 34 90 % -- --  10/15/21 1700 (!) 143/75 -- -- (!) 131 17 95 % -- --  10/15/21 1630 (!) 147/88 -- -- (!) 108 (!) 22 94 % -- --  10/15/21 1615 133/84 -- -- 79 (!) 21 98 % -- --  10/15/21 1600 (!) 146/99 -- -- (!) 139 (!) 21 94 % -- --  10/15/21 1537 (!) 164/105 98.2 F (36.8 C) Oral (!) 136 (!) 23 99 % -- --  10/15/21 1532 -- -- -- -- -- 98 % -- --    General: Awake and alert, no distress. Eyes:  no scleral icterus.   ENT:  There were no oropharyngeal lesions.    Lymphatics:  Negative cervical, supraclavicular or axillary adenopathy.   Respiratory: lungs were clear bilaterally without wheezing or crackles.   Cardiovascular: Regular, tachycardic.  No lower extremity edema. GI:  abdomen was soft, flat, nontender, nondistended, without organomegaly.    Skin exam was without ecchymosis, petechiae.   Neuro exam was nonfocal.  Patient was alert and oriented.  Attention was good.   Language was appropriate.  Mood was normal  without depression.  Speech was not pressured.  Thought content was not tangential.    LABS:  Lab Results  Component Value Date   WBC 12.1 (H) 10/15/2021   HGB 11.4 (L) 10/15/2021   HCT 34.9 (L) 10/15/2021   PLT 441 (H) 10/15/2021   GLUCOSE 103 (H) 10/16/2021   ALT 11 10/15/2021   AST 21 10/15/2021   NA 141 10/16/2021   K 3.7 10/16/2021   CL 106 10/16/2021   CREATININE 0.87 10/16/2021   BUN 15 10/16/2021   CO2 25 10/16/2021   INR >10.0 (HH) 10/16/2021   HGBA1C 5.1 06/27/2019    DG Chest Portable 1 View  Result Date: 10/15/2021 CLINICAL DATA:  Tachycardia, arrhythmia EXAM: PORTABLE CHEST 1 VIEW COMPARISON:  Portable exam 1615 hours compared to 10/14/2019 FINDINGS: Normal heart size, mediastinal contours, and pulmonary vascularity. Atherosclerotic calcification aorta. Bronchitic changes with chronic accentuation of medial RIGHT basilar markings. No acute infiltrate, pleural effusion, or pneumothorax. Bones demineralized with thoracolumbar scoliosis. IMPRESSION: Bronchitic changes with chronic accentuation of RIGHT infrahilar markings. No acute abnormality Electronically Signed   By: Lavonia Dana M.D.   On: 10/15/2021 16:21     ASSESSMENT AND PLAN:  Supratherapeutic PT/INR and PTT Atrial fibrillation with RVR 3.  Hypothyroidism 4.  Protein calorie malnutrition 5.  History of small bowel obstruction status post bowel resection  -The patient's labs have been reviewed.  She has an elevated PT/INR and PTT.  Xarelto can cause at least mild elevation in coags.  Although, some studies show significant elevations with both Eliquis and Xarelto and elevations tend to be higher with Xarelto. -Does not appear to have any obvious underlying liver disease given normal LFTs and T bili which could contribute to elevated coags.  Could have a component of poor nutritional status given that BMI is only 17. -Xarelto noted to be greater than 90% protein bound and patient's albumin has been slowly  dropping.  Additionally, creatinine clearance about 26.  Caution is advised with use of Xarelto in patients with a creatinine clearance of 15-30.  However, patient was taking low-dose Xarelto at 10 mg/day. -Recommend holding Xarelto.   -The patient has no evidence of bleeding.  May benefit from vitamin K administration and final recommendations per Dr. Burr Medico when seen later today.  Thank you for this referral.  Mikey Bussing, DNP, AGPCNP-BC, AOCNP  Addendum  I have seen the patient, examined her. I agree with the assessment and and plan and have edited the notes.   88 yo female with chronic diarrhea from colitis, AF on Xarelto, malnutrition, presented with AF w RVR. Lab showed significantly elevated PT/APTT, no clinical signs of bleeding. I think her elevated PT/PTT are results of Xarelto, malnutrition and possible vitK deficiency from chronic diarrhea. I will order mixing study to rule out coagulation factor deficiency or inhibitor. Although her Cr is normal, given her advanced age, Eliquis maybe slightly safer for her, will let her cardiology team to decide. OK to continue anticoagulation. Will give her one dose vitK tomorrow and repeat PT/APTT later tomorrow or Sunday. No additional work needed at this point, I will f/u as needed.   Truitt Merle  10/16/2021

## 2021-10-16 NOTE — H&P (Addendum)
Cardiology Admission History and Physical:   Patient ID: Teresa Gonzalez MRN: [id number]; DOB: December 26, 1921   Admission date: 10/15/2021  PCP:  Shon Baton, MD   [facility] Providers Cardiologist:  None        Chief Complaint:  tremors  Patient Profile:   Teresa Gonzalez is a 88 y.o. female with PAF (on xarelto), hypothyroidism, SBO and partial small bowel resection c/b chronic diarrhea, and remote uterine CA who is being seen 10/16/2021 for the evaluation of Afib with RVR.  History of Present Illness:   Teresa Gonzalez is a lovely lady who provided the majority of the history. She states that yesterday she was at home when she began to feel tremulous. Associated with this feeling of tremulousness was fatigue, SOB, palpitations and chest tightness. She has caregivers who work with her 24/7 that evaluated her and noted that she was tachycardic to the 150s. Given her tachycardia and symptoms, the patient was brought to the ED. Note that she reports not having issues with her Afib in several months. She takes her metoprolol and xarelto as prescribed with the assistance of her caregivers and has not missed any doses. She does report having URI symptoms since last month with a residual cough. Prior COVID and flu testing were negative per her daughter's report. She has been taking tessalon pearls recently for the cough, but otherwise has been doing well without any major health changes. She denies tobacco, ETOH or illicit drug use.   In the ED her VS were afebrile, HR 138, BP 164/105, RR 23 and satting 99% on RA. Labs notable for WBC 12.1. hbg 11.4, platelets 441, and troponins 70 -> 136. EKG showed Afib with RVR. CXR showed bronchitic changes without focal opacification. In the ED she was given 5 mg IV metoprolol x1 and converted to NSR. Cardiology is consulted for her elevated troponin and Afib.    Past Medical History:  Diagnosis Date   Atrial fibrillation (Teresa Gonzalez)    Depression    Depression     Hypothyroidism    Insomnia    Left carpal tunnel syndrome    By nerve conduction study   Peripheral neuropathy    S/P small bowel resection    with Diarrhea   SBO (small bowel obstruction) ([place]) 06/2019   Status post radiation therapy    Uterine cancer ([place])    Vitamin D deficiency     Past Surgical History:  Procedure Laterality Date   ABDOMINAL HYSTERECTOMY     CATARACT EXTRACTION Bilateral      Medications Prior to Admission: Prior to Admission medications   Medication Sig Start Date End Date Taking? Authorizing Provider  acetaminophen (TYLENOL) 500 MG tablet Take 1,000 mg by mouth every 6 (six) hours as needed for mild pain or moderate pain.   Yes [provider]  benzonatate (TESSALON) 200 MG capsule Take 200 mg by mouth 3 (three) times daily as needed for cough. 10/13/21  Yes [provider]  furosemide (LASIX) 20 MG tablet Take 20 mg by mouth daily as needed for fluid.   Yes [provider]  guaiFENesin (ROBITUSSIN) 100 MG/5ML liquid Take 20 mLs by mouth every 12 (twelve) hours as needed for cough or to loosen phlegm.   Yes [provider]  loperamide (IMODIUM) 2 MG capsule Take 2 mg by mouth 3 (three) times daily.   Yes [provider]  magnesium oxide (MAG-OX) 400 (241.3 Mg) MG tablet Take 1 tablet (400 mg total) by mouth  daily. Patient taking differently: Take 400 mg by mouth 2 (two) times daily. 04/01/17  Yes Nita Sells, MD  meclizine (ANTIVERT) 12.5 MG tablet Take 12.5 mg by mouth 3 (three) times daily as needed for dizziness.   Yes [provider]  metoprolol tartrate (LOPRESSOR) 25 MG tablet Take 1 tablet (25 mg total) by mouth 2 (two) times daily. 08/06/15  Yes Cherene Altes, MD  Multiple Vitamins-Minerals (MULTIVITAMIN WITH MINERALS) tablet Take 1 tablet by mouth daily.   Yes [provider]  omeprazole (PRILOSEC) 40 MG capsule Take 40 mg by mouth daily. 09/04/19  Yes [provider]   raloxifene (EVISTA) 60 MG tablet Take 60 mg by mouth daily. 02/18/20  Yes [provider]  SYNTHROID 50 MCG tablet Take 50 mcg by mouth daily. 02/04/20  Yes [provider]  VITAMIN B1-B12 IJ Inject 1 vial as directed every 21 ( twenty-one) days.   Yes [provider]  Vitamin D, Ergocalciferol, (DRISDOL) 50000 UNITS CAPS Take 50,000 Units by mouth See admin instructions. Takes on Sunday, Monday, Wednesday, Friday   Yes [provider]  XARELTO 10 MG TABS tablet Take 10 mg by mouth daily. 09/22/21  Yes [provider]     Allergies:    Allergies  Allergen Reactions   Codeine Itching   Codeine Itching   Penicillins Swelling   Penicillins Itching and Swelling    Social History:   Social History   Socioeconomic History   Marital status: Married    Spouse name: Not on file   Number of children: 2   Years of education: 16   Highest education level: Not on file  Occupational History   Occupation: Retired Marine scientist  Tobacco Use   Smoking status: Never   Smokeless tobacco: Never  Vaping Use   Vaping Use: Never used  Substance and Sexual Activity   Alcohol use: No   Drug use: No   Sexual activity: Not on file  Other Topics Concern   Not on file  Social History Narrative   ** Merged History Encounter **       Lives in retirement home with Husband 27 yrs old. 2 children.    Social Determinants of Health   Financial Resource Strain: Not on file  Food Insecurity: Not on file  Transportation Needs: Not on file  Physical Activity: Not on file  Stress: Not on file  Social Connections: Not on file  Intimate Partner Violence: Not on file    Family History:   The patient's family history includes Aneurysm in her father; Diabetes in her brother; Stroke in her mother.    ROS:  Please see the history of present illness.  All other ROS reviewed and negative.     Physical Exam/Data:   Vitals:   10/15/21 2230 10/15/21 2245 10/15/21 2300  10/15/21 2345  BP: 126/66 133/74 126/74 135/80  Pulse: 84 83 85 89  Resp: (!) 23 20 19  (!) 22  Temp:      TempSrc:      SpO2: 99% 99% 97% 97%    Intake/Output Summary (Last 24 hours) at 10/16/2021 0023 Last data filed at 10/15/2021 1648 Gross per 24 hour  Intake 749.25 ml  Output --  Net 749.25 ml   Last 3 Weights 03/14/2020 10/15/2019 10/14/2019  Weight (lbs) 100 lb 100 lb 9.6 oz 97 lb 11.2 oz  Weight (kg) 45.36 kg 45.632 kg 44.316 kg     There is no height or weight on file  to calculate BMI.  General:  Well appearing elderly female in NAD, very pleasant HEENT: atraumatic, normocephalic Neck: no JVD Vascular: No carotid bruits; Distal pulses 2+ bilaterally   Cardiac:  normal S1, S2; RRR; no murmur  Lungs:  clear to auscultation bilaterally, no wheezing, rhonchi or rales  Abd: soft, nontender, no hepatomegaly  Ext: trace edema to ankles bilaterally Musculoskeletal:  No deformities, BUE and BLE strength normal and equal Skin: warm and dry  Neuro:  CNs 2-12 intact, no focal abnormalities noted Psych:  Normal affect    EKG:  The ECG that was done was personally reviewed and demonstrates Afib with RVR  Relevant CV Studies:  TTE 08/03/15:  Study Conclusions   - Left ventricle: The cavity size was normal. Wall thickness was    increased in a pattern of moderate LVH. There was focal basal    hypertrophy. Systolic function was normal. The estimated ejection    fraction was in the range of 50% to 55%. Wall motion was normal;    there were no regional wall motion abnormalities.  - Mitral valve: There was moderate regurgitation.  - Pulmonary arteries: Systolic pressure was mildly increased. PA    peak pressure: 34 mm Hg (S).   Laboratory Data:  High Sensitivity Troponin:   Recent Labs  Lab 10/15/21 1654 10/15/21 1944 10/15/21 2215  TROPONINIHS 70* 136* 141*      Chemistry Recent Labs  Lab 10/15/21 1645  NA 135  K 3.8  CL 107  CO2 21*  GLUCOSE 97  BUN 12  CREATININE  0.87  CALCIUM 7.8*  GFRNONAA >60  ANIONGAP 7    Recent Labs  Lab 10/15/21 1645  PROT 5.3*  ALBUMIN 2.6*  AST 21  ALT 11  ALKPHOS 61  BILITOT 0.5   Lipids No results for input(s): CHOL, TRIG, HDL, LABVLDL, LDLCALC, CHOLHDL in the last 168 hours. Hematology Recent Labs  Lab 10/15/21 1602  WBC 12.1*  RBC 4.18  HGB 11.4*  HCT 34.9*  MCV 83.5  MCH 27.3  MCHC 32.7  RDW 17.8*  PLT 441*   Thyroid  Recent Labs  Lab 10/15/21 1645  FREET4 1.08   BNPNo results for input(s): BNP, PROBNP in the last 168 hours.  DDimer No results for input(s): DDIMER in the last 168 hours.   Radiology/Studies:  DG Chest Portable 1 View  Result Date: 10/15/2021 CLINICAL DATA:  Tachycardia, arrhythmia EXAM: PORTABLE CHEST 1 VIEW COMPARISON:  Portable exam 1615 hours compared to 10/14/2019 FINDINGS: Normal heart size, mediastinal contours, and pulmonary vascularity. Atherosclerotic calcification aorta. Bronchitic changes with chronic accentuation of medial RIGHT basilar markings. No acute infiltrate, pleural effusion, or pneumothorax. Bones demineralized with thoracolumbar scoliosis. IMPRESSION: Bronchitic changes with chronic accentuation of RIGHT infrahilar markings. No acute abnormality Electronically Signed   By: Lavonia Dana M.D.   On: 10/15/2021 16:21     Assessment and Plan:   ASHUNTI SCHOFIELD is a 88 y.o. female with PAF (on xarelto), hypothyroidism, SBO and partial small bowel resection c/b chronic diarrhea, and remote uterine CA who is being seen 10/16/2021 for the evaluation of Afib with RVR and elevated troponins.  #Paroxysmal Atrial Fibrillation with RVR #Demand Ischemia ::Pt developed Afib with RVR resulting in a bump in her troponins. I do not feel that she has ACS given that her chest tightness resolved after she went back to NSR and her troponins on 3rd check are flat. I also performed a bedside POCUS and did not see a focal WMA.  From a troponin standpoint, no ischemic eval is needed at  this time. In terms of why she went into Afib with RVR, it's not entirely clear to me. She is getting over a lingering URI which may be a precipitant. Her amenia is chronic and is actually better than prior. Will check a magnesium to ensure those levels are adequate. Since there is some uncertainty regarding the etiology of her RVR and her fragility, I think it's reasonable to admit her Obs overnight, get an echo in the AM, and if all looks stable then likely discharge home. Will restart her home [place] and fractionate her metoprolol as well. -Continue home xarelto -Continue home metoprolol 25 mg BID -TTE in AM -Keep K>2, Mg >4 -Keep telemetry   #Hypothyroidism -Check TSH -continue home synthroid  #History of SBO and Bowel Resection -No signs of GI distress currently -Continue home motility agents   Risk Assessment/Risk Scores:         CHA2DS2-VASc Score = 3  This indicates a 3.2% annual risk of stroke. The patient's score is based upon: AGE >75, female sex        Severity of Illness: The appropriate patient status for this patient is OBSERVATION. Observation status is judged to be reasonable and necessary in order to provide the required intensity of service to ensure the patient's safety. The patient's presenting symptoms, physical exam findings, and initial radiographic and laboratory data in the context of their medical condition is felt to place them at decreased risk for further clinical deterioration. Furthermore, it is anticipated that the patient will be medically stable for discharge from the hospital within 2 midnights of admission.    For questions or updates, please contact [place] Please consult [email or web address] for contact info under     Signed, Hershal Coria, MD  10/16/2021 12:23 AM

## 2021-10-17 ENCOUNTER — Inpatient Hospital Stay (HOSPITAL_COMMUNITY): Payer: Medicare HMO

## 2021-10-17 DIAGNOSIS — Z8542 Personal history of malignant neoplasm of other parts of uterus: Secondary | ICD-10-CM | POA: Diagnosis not present

## 2021-10-17 DIAGNOSIS — N179 Acute kidney failure, unspecified: Secondary | ICD-10-CM

## 2021-10-17 DIAGNOSIS — A419 Sepsis, unspecified organism: Secondary | ICD-10-CM

## 2021-10-17 DIAGNOSIS — Z79890 Hormone replacement therapy (postmenopausal): Secondary | ICD-10-CM | POA: Diagnosis not present

## 2021-10-17 DIAGNOSIS — R6521 Severe sepsis with septic shock: Secondary | ICD-10-CM | POA: Diagnosis not present

## 2021-10-17 DIAGNOSIS — Z880 Allergy status to penicillin: Secondary | ICD-10-CM | POA: Diagnosis not present

## 2021-10-17 DIAGNOSIS — I480 Paroxysmal atrial fibrillation: Secondary | ICD-10-CM | POA: Diagnosis present

## 2021-10-17 DIAGNOSIS — Z681 Body mass index (BMI) 19 or less, adult: Secondary | ICD-10-CM | POA: Diagnosis not present

## 2021-10-17 DIAGNOSIS — Z885 Allergy status to narcotic agent status: Secondary | ICD-10-CM | POA: Diagnosis not present

## 2021-10-17 DIAGNOSIS — R079 Chest pain, unspecified: Secondary | ICD-10-CM | POA: Diagnosis present

## 2021-10-17 DIAGNOSIS — Z7901 Long term (current) use of anticoagulants: Secondary | ICD-10-CM | POA: Diagnosis not present

## 2021-10-17 DIAGNOSIS — E46 Unspecified protein-calorie malnutrition: Secondary | ICD-10-CM | POA: Diagnosis present

## 2021-10-17 DIAGNOSIS — Z79899 Other long term (current) drug therapy: Secondary | ICD-10-CM | POA: Diagnosis not present

## 2021-10-17 DIAGNOSIS — E871 Hypo-osmolality and hyponatremia: Secondary | ICD-10-CM | POA: Diagnosis not present

## 2021-10-17 DIAGNOSIS — J690 Pneumonitis due to inhalation of food and vomit: Secondary | ICD-10-CM | POA: Diagnosis present

## 2021-10-17 DIAGNOSIS — I4891 Unspecified atrial fibrillation: Secondary | ICD-10-CM | POA: Diagnosis not present

## 2021-10-17 DIAGNOSIS — Z923 Personal history of irradiation: Secondary | ICD-10-CM | POA: Diagnosis not present

## 2021-10-17 DIAGNOSIS — R0682 Tachypnea, not elsewhere classified: Secondary | ICD-10-CM | POA: Diagnosis not present

## 2021-10-17 DIAGNOSIS — G9341 Metabolic encephalopathy: Secondary | ICD-10-CM | POA: Diagnosis not present

## 2021-10-17 DIAGNOSIS — E039 Hypothyroidism, unspecified: Secondary | ICD-10-CM | POA: Diagnosis present

## 2021-10-17 DIAGNOSIS — Z66 Do not resuscitate: Secondary | ICD-10-CM | POA: Diagnosis not present

## 2021-10-17 DIAGNOSIS — N170 Acute kidney failure with tubular necrosis: Secondary | ICD-10-CM | POA: Diagnosis present

## 2021-10-17 DIAGNOSIS — I517 Cardiomegaly: Secondary | ICD-10-CM | POA: Diagnosis not present

## 2021-10-17 DIAGNOSIS — J9601 Acute respiratory failure with hypoxia: Secondary | ICD-10-CM | POA: Diagnosis not present

## 2021-10-17 DIAGNOSIS — A4151 Sepsis due to Escherichia coli [E. coli]: Secondary | ICD-10-CM | POA: Diagnosis present

## 2021-10-17 DIAGNOSIS — R54 Age-related physical debility: Secondary | ICD-10-CM | POA: Diagnosis present

## 2021-10-17 DIAGNOSIS — I447 Left bundle-branch block, unspecified: Secondary | ICD-10-CM | POA: Diagnosis present

## 2021-10-17 DIAGNOSIS — Z20822 Contact with and (suspected) exposure to covid-19: Secondary | ICD-10-CM | POA: Diagnosis present

## 2021-10-17 DIAGNOSIS — N390 Urinary tract infection, site not specified: Secondary | ICD-10-CM | POA: Diagnosis not present

## 2021-10-17 DIAGNOSIS — R251 Tremor, unspecified: Secondary | ICD-10-CM | POA: Diagnosis present

## 2021-10-17 LAB — GLUCOSE, CAPILLARY
Glucose-Capillary: 168 mg/dL — ABNORMAL HIGH (ref 70–99)
Glucose-Capillary: 170 mg/dL — ABNORMAL HIGH (ref 70–99)

## 2021-10-17 LAB — COMPREHENSIVE METABOLIC PANEL
ALT: 17 U/L (ref 0–44)
AST: 36 U/L (ref 15–41)
Albumin: 2.4 g/dL — ABNORMAL LOW (ref 3.5–5.0)
Alkaline Phosphatase: 169 U/L — ABNORMAL HIGH (ref 38–126)
Anion gap: 13 (ref 5–15)
BUN: 25 mg/dL — ABNORMAL HIGH (ref 8–23)
CO2: 19 mmol/L — ABNORMAL LOW (ref 22–32)
Calcium: 7.8 mg/dL — ABNORMAL LOW (ref 8.9–10.3)
Chloride: 99 mmol/L (ref 98–111)
Creatinine, Ser: 1.29 mg/dL — ABNORMAL HIGH (ref 0.44–1.00)
GFR, Estimated: 38 mL/min — ABNORMAL LOW (ref >60)
Glucose, Bld: 168 mg/dL — ABNORMAL HIGH (ref 70–99)
Potassium: 3.7 mmol/L (ref 3.5–5.1)
Sodium: 131 mmol/L — ABNORMAL LOW (ref 135–145)
Total Bilirubin: 0.5 mg/dL (ref 0.3–1.2)
Total Protein: 5.9 g/dL — ABNORMAL LOW (ref 6.5–8.1)

## 2021-10-17 LAB — CBC
HCT: 27.3 % — ABNORMAL LOW (ref 36.0–46.0)
HCT: 34.8 % — ABNORMAL LOW (ref 36.0–46.0)
Hemoglobin: 9.2 g/dL — ABNORMAL LOW (ref 12.0–15.0)
Hemoglobin: 11.3 g/dL — ABNORMAL LOW (ref 12.0–15.0)
MCH: 27.1 pg (ref 26.0–34.0)
MCH: 26.3 pg (ref 26.0–34.0)
MCHC: 33.7 g/dL (ref 30.0–36.0)
MCHC: 32.5 g/dL (ref 30.0–36.0)
MCV: 80.3 fL (ref 80.0–100.0)
MCV: 80.9 fL (ref 80.0–100.0)
Platelets: 272 10*3/uL (ref 150–400)
Platelets: 285 10*3/uL (ref 150–400)
RBC: 3.4 MIL/uL — ABNORMAL LOW (ref 3.87–5.11)
RBC: 4.3 MIL/uL (ref 3.87–5.11)
RDW: 16.8 % — ABNORMAL HIGH (ref 11.5–15.5)
RDW: 17.2 % — ABNORMAL HIGH (ref 11.5–15.5)
WBC: 18.4 10*3/uL — ABNORMAL HIGH (ref 4.0–10.5)
WBC: 9.1 10*3/uL (ref 4.0–10.5)
nRBC: 0 % (ref 0.0–0.2)
nRBC: 0 % (ref 0.0–0.2)

## 2021-10-17 LAB — URINALYSIS, ROUTINE W REFLEX MICROSCOPIC
Bilirubin Urine: NEGATIVE
Glucose, UA: NEGATIVE mg/dL
Ketones, ur: NEGATIVE mg/dL
Nitrite: NEGATIVE
Protein, ur: 100 mg/dL — AB
Specific Gravity, Urine: 1.025 (ref 1.005–1.030)
pH: 5.5 (ref 5.0–8.0)

## 2021-10-17 LAB — BASIC METABOLIC PANEL
Anion gap: 6 (ref 5–15)
BUN: 20 mg/dL (ref 8–23)
CO2: 24 mmol/L (ref 22–32)
Calcium: 7.5 mg/dL — ABNORMAL LOW (ref 8.9–10.3)
Chloride: 104 mmol/L (ref 98–111)
Creatinine, Ser: 1.08 mg/dL — ABNORMAL HIGH (ref 0.44–1.00)
GFR, Estimated: 47 mL/min — ABNORMAL LOW (ref >60)
Glucose, Bld: 117 mg/dL — ABNORMAL HIGH (ref 70–99)
Potassium: 3.2 mmol/L — ABNORMAL LOW (ref 3.5–5.1)
Sodium: 134 mmol/L — ABNORMAL LOW (ref 135–145)

## 2021-10-17 LAB — URINALYSIS, MICROSCOPIC (REFLEX)
RBC / HPF: >50 RBC/hpf (ref 0–5)
WBC, UA: >50 WBC/hpf (ref 0–5)

## 2021-10-17 LAB — PROCALCITONIN: Procalcitonin: 12 ng/mL

## 2021-10-17 LAB — PROTIME-INR
INR: >10 (ref 0.8–1.2)
INR: >10 (ref 0.8–1.2)
Prothrombin Time: >90 seconds — ABNORMAL HIGH (ref 11.4–15.2)
Prothrombin Time: >90 seconds — ABNORMAL HIGH (ref 11.4–15.2)

## 2021-10-17 LAB — MRSA NEXT GEN BY PCR, NASAL: MRSA by PCR Next Gen: NOT DETECTED

## 2021-10-17 LAB — STREP PNEUMONIAE URINARY ANTIGEN: Strep Pneumo Urinary Antigen: NEGATIVE

## 2021-10-17 LAB — LACTIC ACID, PLASMA: Lactic Acid, Venous: 3.8 mmol/L (ref 0.5–1.9)

## 2021-10-17 LAB — APTT: aPTT: 47 seconds — ABNORMAL HIGH (ref 24–36)

## 2021-10-17 LAB — MAGNESIUM: Magnesium: 0.9 mg/dL — CL (ref 1.7–2.4)

## 2021-10-17 MED ORDER — ACETAMINOPHEN 650 MG RE SUPP
650.0000 mg | Freq: Three times a day (TID) | RECTAL | Status: DC | PRN
  Administered 2021-10-17: 650 mg via RECTAL
  Filled 2021-10-17: qty 1

## 2021-10-17 MED ORDER — AMIODARONE HCL IN DEXTROSE 360-4.14 MG/200ML-% IV SOLN
60.0000 mg/h | INTRAVENOUS | Status: AC
  Administered 2021-10-17 (×2): 60 mg/h via INTRAVENOUS
  Filled 2021-10-17: qty 200

## 2021-10-17 MED ORDER — CHLORHEXIDINE GLUCONATE CLOTH 2 % EX PADS
6.0000 | MEDICATED_PAD | Freq: Every day | CUTANEOUS | Status: DC
  Administered 2021-10-17 – 2021-10-19 (×3): 6 via TOPICAL

## 2021-10-17 MED ORDER — LACTATED RINGERS IV SOLN: INTRAVENOUS | Status: DC

## 2021-10-17 MED ORDER — PANTOPRAZOLE SODIUM 40 MG IV SOLR
40.0000 mg | INTRAVENOUS | Status: DC
  Administered 2021-10-17: 40 mg via INTRAVENOUS
  Filled 2021-10-17: qty 40

## 2021-10-17 MED ORDER — AMIODARONE HCL IN DEXTROSE 360-4.14 MG/200ML-% IV SOLN
30.0000 mg/h | INTRAVENOUS | Status: DC
  Administered 2021-10-18: 30 mg/h via INTRAVENOUS
  Filled 2021-10-17: qty 200

## 2021-10-17 MED ORDER — VANCOMYCIN HCL 500 MG/100ML IV SOLN: 500.0000 mg | INTRAVENOUS | Status: DC

## 2021-10-17 MED ORDER — MAGNESIUM SULFATE 4 GM/100ML IV SOLN
4.0000 g | Freq: Once | INTRAVENOUS | Status: AC
  Administered 2021-10-17: 4 g via INTRAVENOUS
  Filled 2021-10-17: qty 100

## 2021-10-17 MED ORDER — SODIUM CHLORIDE 0.9 % IV BOLUS
500.0000 mL | Freq: Once | INTRAVENOUS | Status: AC | PRN
  Administered 2021-10-17: 500 mL via INTRAVENOUS

## 2021-10-17 MED ORDER — SODIUM CHLORIDE 0.9 % IV SOLN
2.0000 g | Freq: Once | INTRAVENOUS | Status: AC
  Administered 2021-10-17: 2 g via INTRAVENOUS
  Filled 2021-10-17: qty 2

## 2021-10-17 MED ORDER — LACTATED RINGERS IV BOLUS
500.0000 mL | Freq: Once | INTRAVENOUS | Status: AC
  Administered 2021-10-17: 500 mL via INTRAVENOUS

## 2021-10-17 MED ORDER — MAGNESIUM SULFATE 4 GM/100ML IV SOLN
4.0000 g | Freq: Once | INTRAVENOUS | Status: DC
  Filled 2021-10-17: qty 100

## 2021-10-17 MED ORDER — VANCOMYCIN HCL IN DEXTROSE 1-5 GM/200ML-% IV SOLN
1000.0000 mg | Freq: Once | INTRAVENOUS | Status: AC
  Administered 2021-10-17: 1000 mg via INTRAVENOUS
  Filled 2021-10-17 (×2): qty 200

## 2021-10-17 MED ORDER — SODIUM CHLORIDE 0.9 % IV SOLN
1.0000 g | INTRAVENOUS | Status: DC
  Administered 2021-10-18: 1 g via INTRAVENOUS
  Filled 2021-10-17 (×2): qty 1

## 2021-10-17 MED ORDER — DEXTROSE IN LACTATED RINGERS 5 % IV SOLN: INTRAVENOUS | Status: DC

## 2021-10-17 MED ORDER — MAGNESIUM SULFATE 4 GM/100ML IV SOLN: 4.0000 g | Freq: Once | INTRAVENOUS | Status: DC

## 2021-10-17 NOTE — Progress Notes (Signed)
°   10/17/21 1624 10/17/21 1700  Vitals  Temp 98.3 F (36.8 C) (!) 103.6 F (39.8 C)  Temp Source Oral  --   BP (!) 158/90  --   MAP (mmHg) 111  --   BP Location Left Arm  --   BP Method Automatic  --   Patient Position (if appropriate) Lying  --   Pulse Rate (!) 116  --   Pulse Rate Source Monitor  --   ECG Heart Rate (!) 117  --   Resp (!) 41  --   Level of Consciousness  Level of Consciousness Alert  --   Oxygen Therapy  SpO2 95 %  --   O2 Device Room Air  --   Patient Activity (if Appropriate) In bed  --   Pain Assessment  Pain Scale 0-10  --   Pain Score 0  --   Patients Stated Pain Goal 0  --   Complaints & Interventions  Complains of Other (Comment) (chills, increase in respirtations)  --   Interventions Other (comment) (blankets, warm room)  --   Patients response to intervention Unchanged  --   MEWS Score  MEWS Temp 0 2  MEWS Systolic 0 0  MEWS Pulse 2 2  MEWS RR 3 3  MEWS LOC 0 0  MEWS Score 5 7  MEWS Score Color Red Red  Provider Notification  Provider Name/Title Angie, PA (CARDIOLOGY)  --   Date Provider Notified 10/17/21  --   Time Provider Notified 1628  --   Notification Type Call  --   Notification Reason Change in status  --   Date of Provider Response 10/17/21  --   Time of Provider Response 1628  --   Rapid Response Notification  Name of Rapid Response RN Notified Saralyn Pilar, RN  --   Date Rapid Response Notified 10/17/21  --   Time Rapid Response Notified 0300  --

## 2021-10-17 NOTE — Sepsis Progress Note (Signed)
Conversations have taken place with bedside RN on 2H who stated multiple attempts to obtain [facility] and F/U Lactic for Sepsis Protocol but due to extreme difficulty in obtaining labs the Abx's were not delayed, Continue to monitor sepsis, labs, VS

## 2021-10-17 NOTE — Progress Notes (Signed)
Patient arrived to [facility], bed 14 around 108 from [place]. Patient oriented to room. Patient assessed and IV fluids and Magnesium initiated per MD orders. IV team arrived and new IV access obtained. Patient hypotensive upon arrival but MAP's 60-64. Blood pressure and MAP quickly improved as patient received fluid boluses. Spoke with CCM NP and MD regarding patient status. MD and NP rounded on patient as well. Orders received to intermittent straight cath to obtain urine specimens. Also received order to maintain patient's MAP's between 60-65. Levo not initiated at this time since BP's improved. Hillery Aldo NP also stated to restart Amiodarone infusion once patient received a second LR bolus. Patient's CBG 75. Notified CCM and received an order for D 5 LR. Lab having difficulty obtaining blood cultures and blood work due to patient's poor vasculature.  IV Vancomycin and Cefepime started due to lab delay/difficulty. Notified CCM team that antibiotics were initiated 5 minutes prior to lab obtaining the first set of blood cultures.

## 2021-10-17 NOTE — Progress Notes (Signed)
Date and time results received: 10/17/21 1828 (use smartphrase ".now" to insert current time)  Test: LACTIC ACID  Critical Value: 3.8  Name of Provider Notified: Dr Tamala Julian.  Orders Received? Or Actions Taken?:  Orders received to move patient to ICU

## 2021-10-17 NOTE — Progress Notes (Addendum)
Patient's caregiver at bedside notified RN that patient was feeling "jittery", noted tachypnea.  Saralyn Pilar with Rapid Response notified.  Temp 98.3 oral, 158/90, HR 116, RR 41.  Doreene Adas, PA also notified she states she will come to assess patient.   1700 patient was warm to touch, rectal temp taken 103.6  Verbal order from Dr Humphrey Rolls received to give 650 tylenol rectally.   BP tending down to 29'J systolic.  Dr. Humphrey Rolls notified orders received to stop IV amio and to give 500cc NS bolus.

## 2021-10-17 NOTE — Progress Notes (Signed)
°   10/17/21 1007  Assess: MEWS Score  BP 138/88  Pulse Rate (!) 115  Assess: MEWS Score  MEWS Temp 0  MEWS Systolic 0  MEWS Pulse 2  MEWS RR 0  MEWS LOC 0  MEWS Score 2  MEWS Score Color Yellow  Assess: if the MEWS score is Yellow or Red  Were vital signs taken at a resting state? Yes  Focused Assessment No change from prior assessment  Early Detection of Sepsis Score *See Row Information* Medium  MEWS guidelines implemented *See Row Information* Yes  Treat  MEWS Interventions Administered scheduled meds/treatments (po metoprolol)  Take Vital Signs  Increase Vital Sign Frequency  Yellow: Q 2hr X 2 then Q 4hr X 2, if remains yellow, continue Q 4hrs  Escalate  MEWS: Escalate Yellow: discuss with charge nurse/RN and consider discussing with provider and RRT  Notify: Charge Nurse/RN  Name of Charge Nurse/RN Notified brooke rn  Date Charge Nurse/RN Notified 10/17/21  Time Charge Nurse/RN Notified 1029  Notify: Provider  Provider Name/Title dr Harrington Challenger  Date Provider Notified 10/17/21  Time Provider Notified 1030  Notification Type Face-to-face  Provider response Other (Comment) (iv amio ordered)  Date of Provider Response 10/17/21  Time of Provider Response 1100

## 2021-10-17 NOTE — Progress Notes (Signed)
Janett Billow, RN and I at bedside. Pt respirations of 41 per minute.Pt denies chest pain. O2 100 on room air. Pt complaining of feeling cold. Blood pressure 158/90. Heart rate 116. Cardiology paged.

## 2021-10-17 NOTE — Progress Notes (Signed)
Date and time results received: 10/17/21 at 1330 (use smartphrase ".now" to insert current time)  Test:  IPT/NR Critical Value: PT= >90.0, INR=>10.0  Name of Provider Notified: Doreene Adas, PA  Orders Received? Or Actions Taken?:  NO NEW ORDERS RECEIVED.

## 2021-10-17 NOTE — Progress Notes (Signed)
Called to bedside for increased HR in the 110-120s, tachypnea, and subjective feeling of "jittery." Pt states she does feel this jittery feeling when her heart rate goes fast. On exam, lungs sound clear with diminished breath sounds in bases. Amio running at 60 mg/hr. No signs of active bleeding. She does have a nonproductive cough that seems worse. She coughed with chicken noodle soup this morning.   O2 on room air was 91%, O2 placed with O2 sat now 95-96%.   Given history of coughing with thin liquids, I question aspiration PNA. Stat portable CXR with some increased atelectasis, will wait for official read. I have ordered speech consult. Stat labs pending, including lactic and procalcitonin.   Report given to overnight cards fellow.

## 2021-10-17 NOTE — Progress Notes (Signed)
Progress Note  Patient Name: Teresa Gonzalez Date of Encounter: 10/17/2021  [facility] Cardiologist: new  Subjective   No CP  Breathing fair    Inpatient Medications    Scheduled Meds:  levothyroxine  50 mcg Oral QAC breakfast   loperamide  2 mg Oral TID   magnesium oxide  400 mg Oral BID   metoprolol tartrate  50 mg Oral BID   multivitamin with minerals  1 tablet Oral Daily   pantoprazole  40 mg Oral Daily   phytonadione  10 mg Oral Once   raloxifene  60 mg Oral Daily   rivaroxaban  10 mg Oral Daily   Continuous Infusions:  PRN Meds: acetaminophen, benzonatate, guaiFENesin, meclizine   Vital Signs    Vitals:   10/16/21 2044 10/16/21 2239 10/17/21 0005 10/17/21 0459  BP: 131/65 105/66 (!) 118/59 105/86  Pulse: 98 (!) 115 85 82  Resp: 18  18 17   Temp: 99.6 F (37.6 C)  (!) 97.5 F (36.4 C) (!) 97.5 F (36.4 C)  TempSrc: Oral  Oral Oral  SpO2: 95%  95% 96%  Weight:      Height:       No intake or output data in the 24 hours ending 10/17/21 0804  Last 3 Weights 10/16/2021 03/14/2020 10/15/2019  Weight (lbs) 100 lb 100 lb 100 lb 9.6 oz  Weight (kg) 45.36 kg 45.36 kg 45.632 kg      Telemetry   SR, ST, Afib with RVR  - Personally Reviewed  ECG    No am EKG  Physical Exam   GEN: No acute distress.   Neck: No JVD Cardiac: Regular, tachy, no murmurs, rubs, or gallops.  Respiratory: Clear to auscultation bilaterally. GI: Soft, nontender, non-distended  MS: No edema; No deformity. Neuro:  Nonfocal  Psych: Normal affect   Labs    High Sensitivity Troponin:   Recent Labs  Lab 10/15/21 1654 10/15/21 1944 10/15/21 2215  TROPONINIHS 70* 136* 141*     Chemistry Recent Labs  Lab 10/15/21 1645 10/16/21 0554 10/17/21 0222  NA 135 141 134*  K 3.8 3.7 3.2*  CL 107 106 104  CO2 21* 25 24  GLUCOSE 97 103* 117*  BUN 12 15 20   CREATININE 0.87 0.87 1.08*  CALCIUM 7.8* 7.7* 7.5*  MG  --  1.0*  --   PROT 5.3*  --   --   ALBUMIN 2.6*  --   --   AST 21   --   --   ALT 11  --   --   ALKPHOS 61  --   --   BILITOT 0.5  --   --   GFRNONAA >60 >60 47*  ANIONGAP 7 10 6     Lipids No results for input(s): CHOL, TRIG, HDL, LABVLDL, LDLCALC, CHOLHDL in the last 168 hours.  Hematology Recent Labs  Lab 10/15/21 1602 10/17/21 0222  WBC 12.1* 18.4*  RBC 4.18 3.40*  HGB 11.4* 9.2*  HCT 34.9* 27.3*  MCV 83.5 80.3  MCH 27.3 27.1  MCHC 32.7 33.7  RDW 17.8* 16.8*  PLT 441* 272   Thyroid  Recent Labs  Lab 10/15/21 1645  FREET4 1.08    BNPNo results for input(s): BNP, PROBNP in the last 168 hours.  DDimer No results for input(s): DDIMER in the last 168 hours.   Radiology    DG Chest Portable 1 View  Result Date: 10/15/2021 CLINICAL DATA:  Tachycardia, arrhythmia EXAM: PORTABLE CHEST 1 VIEW COMPARISON:  Portable exam 1615 hours compared to 10/14/2019 FINDINGS: Normal heart size, mediastinal contours, and pulmonary vascularity. Atherosclerotic calcification aorta. Bronchitic changes with chronic accentuation of medial RIGHT basilar markings. No acute infiltrate, pleural effusion, or pneumothorax. Bones demineralized with thoracolumbar scoliosis. IMPRESSION: Bronchitic changes with chronic accentuation of RIGHT infrahilar markings. No acute abnormality Electronically Signed   By: Lavonia Dana M.D.   On: 10/15/2021 16:21   ECHOCARDIOGRAM COMPLETE  Result Date: 10/16/2021    ECHOCARDIOGRAM REPORT   Patient Name:   BRITNEY NEWSTROM Date of Exam: 10/16/2021 Medical Rec #:  [id number]     Height:       64.0 in Accession #:    [id number]    Weight:       100.0 lb Date of Birth:  1922/06/27     BSA:          1.457 m Patient Age:    88 years      BP:           112/69 mmHg Patient Gender: F             HR:           87 bpm. Exam Location:  Inpatient Procedure: 2D Echo Indications:    Atrial fibrillation  History:        Patient has no prior history of Echocardiogram examinations.  Sonographer:    SP Referring Phys: [id number] Hershal Coria IMPRESSIONS  1. Compared with  the echo 07/2015, wall motion abnormalities are new. Left ventricular ejection fraction, by estimation, is 45 to 50%. The left ventricle has mildly decreased function. The left ventricle demonstrates regional wall motion abnormalities (see scoring diagram/findings for description). There is moderate left ventricular hypertrophy. Left ventricular diastolic parameters are consistent with Grade I diastolic dysfunction (impaired relaxation).  2. Right ventricular systolic function is normal. The right ventricular size is normal. There is mildly elevated pulmonary artery systolic pressure.  3. The mitral valve is normal in structure. Mild mitral valve regurgitation. No evidence of mitral stenosis.  4. The aortic valve is tricuspid. Aortic valve regurgitation is trivial. No aortic stenosis is present.  5. The inferior vena cava is normal in size with greater than 50% respiratory variability, suggesting right atrial pressure of 3 mmHg. FINDINGS  Left Ventricle: Compared with the echo 07/2015, wall motion abnormalities are new. Left ventricular ejection fraction, by estimation, is 45 to 50%. The left ventricle has mildly decreased function. The left ventricle demonstrates regional wall motion abnormalities. The left ventricular internal cavity size was normal in size. There is moderate left ventricular hypertrophy. Left ventricular diastolic parameters are consistent with Grade I diastolic dysfunction (impaired relaxation). Normal left ventricular filling pressure.  LV Wall Scoring: The entire anterior wall, entire septum, apical lateral segment, and apex are hypokinetic. The antero-lateral wall, entire inferior wall, and posterior wall are normal. Right Ventricle: The right ventricular size is normal. No increase in right ventricular wall thickness. Right ventricular systolic function is normal. There is mildly elevated pulmonary artery systolic pressure. The tricuspid regurgitant velocity is 3.08  m/s, and with an assumed  right atrial pressure of 3 mmHg, the estimated right ventricular systolic pressure is 37.1 mmHg. Left Atrium: Left atrial size was normal in size. Right Atrium: Right atrial size was normal in size. Pericardium: Trivial pericardial effusion is present. Mitral Valve: The mitral valve is normal in structure. Mild mitral valve regurgitation. No evidence of mitral valve stenosis. Tricuspid Valve: The tricuspid valve is normal in structure. Tricuspid valve  regurgitation is mild . No evidence of tricuspid stenosis. Aortic Valve: The aortic valve is tricuspid. Aortic valve regurgitation is trivial. No aortic stenosis is present. Aortic valve mean gradient measures 6.0 mmHg. Aortic valve peak gradient measures 11.2 mmHg. Aortic valve area, by VTI measures 2.10 cm. Pulmonic Valve: The pulmonic valve was normal in structure. Pulmonic valve regurgitation is trivial. No evidence of pulmonic stenosis. Aorta: The aortic root is normal in size and structure. Venous: The inferior vena cava is normal in size with greater than 50% respiratory variability, suggesting right atrial pressure of 3 mmHg. IAS/Shunts: No atrial level shunt detected by color flow Doppler.  LEFT VENTRICLE PLAX 2D LVIDd:         3.70 cm   Diastology LVIDs:         2.70 cm   LV e' medial:    2.83 cm/s LV PW:         1.20 cm   LV E/e' medial:  22.4 LV IVS:        1.40 cm   LV e' lateral:   7.51 cm/s LVOT diam:     1.90 cm   LV E/e' lateral: 8.4 LV SV:         55 LV SV Index:   38 LVOT Area:     2.84 cm  RIGHT VENTRICLE             IVC RV S prime:     15.00 cm/s  IVC diam: 1.70 cm TAPSE (M-mode): 2.5 cm LEFT ATRIUM             Index        RIGHT ATRIUM           Index LA diam:        3.60 cm 2.47 cm/m   RA Area:     13.60 cm LA Vol (A2C):   27.6 ml 18.94 ml/m  RA Volume:   33.90 ml  23.26 ml/m LA Vol (A4C):   42.1 ml 28.89 ml/m LA Biplane Vol: 34.3 ml 23.54 ml/m  AORTIC VALVE AV Area (Vmax):    2.21 cm AV Area (Vmean):   2.06 cm AV Area (VTI):     2.10  cm AV Vmax:           167.00 cm/s AV Vmean:          113.000 cm/s AV VTI:            0.262 m AV Peak Grad:      11.2 mmHg AV Mean Grad:      6.0 mmHg LVOT Vmax:         130.00 cm/s LVOT Vmean:        82.000 cm/s LVOT VTI:          0.194 m LVOT/AV VTI ratio: 0.74  AORTA Ao Root diam: 2.70 cm Ao Asc diam:  2.90 cm MITRAL VALVE               TRICUSPID VALVE MV Area (PHT): 3.99 cm    TR Peak grad:   37.9 mmHg MV Decel Time: 190 msec    TR Vmax:        308.00 cm/s MV E velocity: 63.40 cm/s MV A velocity: 78.00 cm/s  SHUNTS MV E/A ratio:  0.81        Systemic VTI:  0.19 m  Systemic Diam: 1.90 cm Skeet Latch MD Electronically signed by Skeet Latch MD Signature Date/Time: 10/16/2021/4:58:26 PM    Final     Cardiac Studies   Echo  10/15/21 ompared with the echo 07/2015, wall motion abnormalities are new. Left ventricular ejection fraction, by estimation, is 45 to 50%. The left ventricle has mildly decreased function. The left ventricle demonstrates regional wall motion abnormalities (see scoring diagram/findings for description). There is moderate left ventricular hypertrophy. Left ventricular diastolic parameters are consistent with Grade I diastolic dysfunction (impaired relaxation). 1. Right ventricular systolic function is normal. The right ventricular size is normal. There is mildly elevated pulmonary artery systolic pressure. 2. The mitral valve is normal in structure. Mild mitral valve regurgitation. No evidence of mitral stenosis. 3. The aortic valve is tricuspid. Aortic valve regurgitation is trivial. No aortic stenosis is present. 4. The inferior vena cava is normal in size with greater than 50% respiratory variability, suggesting right atrial pressure of 3 mmHg.  Patient Profile     88 y.o. female with long standing PAF on Xarelto admitted with recurrent atrial fib with RVR. Recent upper respiratory illness with residual cough.   Assessment & Plan     Atrial fib with RVR: Patient's heart rhythm is very labile   While taking to her she is in afib   Rates 90s   Then increases suddenly to 72s     Daugher says she has not had it like this in the past     I do not think metoprolol alone will help.  Her BP wont tolerate I would recomm amiodarone for heart rate/possible rhythm control    Can start IV with no bolus until HR improves     2  Heme   Pt with signif elevation of PT/APTT.  No bleeding   Seen by heme yesterday    Think due to Xarelto, malnutrition, poss Vit K deffc due to diarrhea.   Mixing stuidies ordered.    Vit K given  Will transion to Eliquis  2.5 bid     Wait on am labs    Repeat labs pending   (PT/PTT) For questions or updates, please contact [place] Please consult [email or web address] for contact info under        Signed, Dorris Carnes, MD  10/17/2021, 8:04 AM

## 2021-10-17 NOTE — Progress Notes (Signed)
Date and time results received: 10/17/21 1826 (use smartphrase ".now" to insert current time)  Test: MAGNESIUM  Critical Value: 0.9  Name of Provider Notified: Dr. Tamala Julian  Orders Received? Or Actions Taken?:  Orders received to move patient to ICU

## 2021-10-17 NOTE — Significant Event (Incomplete)
Rapid Response Event Note   Reason for Call :  afib  Initial Focused Assessment:  Called to come and lay eyes on this patient because there are concerns that she is getting worse. The patient is lying in bed and in no acute distress. She is warm to the touch. Lower extremeties are warm as well. She is currently in afib and receiving amiodarone. Her aide, who is bedside, stated that she noticed that the patient was shivering more. The patient also has a very weak and wet sounding cough.   Throughout the day her BP has been trending downward. Her heartrate is elevated at 111. She has a rectal temp of 103.6.    Interventions:  Vitals taken Labs drawn and awaiting results Xray obtained and it shows a new hazy opacity in the right infrahilar lung that could possibly showing atelectasis, pneumonia, or aspiration Amiodarone stopped due to her decreased blood pressure 500 mL of NS bolus given PR 650 mg of tylenol  Plan of Care:  Patient will remain on 6E for now. Vital sign frequency has been changed to q15 min.   Event Summary:   MD Notified: Cards bedside Call Time: 9150 Arrival Time: 4136 End Time: [place], RN

## 2021-10-17 NOTE — Sepsis Progress Note (Signed)
[place] following for Sepsis Protocol 

## 2021-10-17 NOTE — Consult Note (Signed)
NAME:  Teresa Gonzalez, MRN:  [id number], DOB:  1921-10-30, LOS: 0 ADMISSION DATE:  10/15/2021, CONSULTATION DATE:  10/17/2021 REFERRING MD:  RRT, CHIEF COMPLAINT:  Hypotension   History of Present Illness:   88 year old female with prior history as below significant for PAF on Xarelto admitted to cardiology 1/6 for afib with RVR.  Patient developed symptoms of feeling tremulousness, fatigue, SOB, palpitations and chest tightness at home on 1/5. She has 24 hrs caregivers who noted her HR in 150's.  Also associated URI symptoms for one month with residual cough.    On admit, she was afebrile, normal RA saturations, normo to hypertensive, and in afib with RVR.  Initial CXR with bronchitic changes.  She was given metoprolol in ER and converted to NSR.  Slight elevation in troponins.  Additionally found to have INR> 10  On 1/7 questions of coughing with liquids. New O2 requirement of 4L. Lactate now 3.8.   PCCM consulted for hypotension  Pertinent  Medical History  PAF on Xarelto, hypothyroidism, SBO/ partial small bowel resection c/b chronic diarrhea, uterine cancer, depression, peripheral neuropathy, vit D deficiency, chronic UTIs  Significant Hospital Events: Including procedures, antibiotic start and stop dates in addition to other pertinent events   1/6 Admit 1/7 PCCM consult  Interim History / Subjective:  See above  Subjective: denies chest pain, SOB, pain  Objective   Blood pressure (!) 89/70, pulse 100, temperature (!) 102.9 F (39.4 C), temperature source Oral, resp. rate 19, height 5\' 4"  (1.626 m), weight 45.4 kg, SpO2 94 %.    4 L [place]     Intake/Output Summary (Last 24 hours) at 10/17/2021 1918 Last data filed at 10/17/2021 1000 Gross per 24 hour  Intake 180 ml  Output --  Net 180 ml   Filed Weights   10/16/21 0805  Weight: 45.4 kg    Examination: General: In bed, NAD, appears comfortable, elderly HEENT: MM pink/moist, anicteric, atraumatic Neuro: RASS 0, PERRL 32mm, GCS  15 CV: S1S2, Afib, no m/r/g appreciated PULM:  air movement in all lobes, trachea midline, chest expansion symmetric GI: soft, bsx4 active, non-tender   Extremities: warm/dry, no pretibial edema, capillary refill less than 3 seconds  Skin:  no rashes or lesions noted  WBC 12.1>18.4>8.1 HGB 9.2>11.3 MG 1.0>0.9 Lactate 3.8 INR greater than 10 NA 141>134>131  Resolved Hospital Problem list     Assessment & Plan:  Right lower lobe pneumonia, suspect aspiration ?acute on chronic UTi  Septic shock, secondary to above Lactic acidosis, secondary to above WBC 12.1>18.4>8.1, Tmax 103.6. CXR with RLL infiltrate. New 4 L o2 requirement. Reports of patient coughing with liquids.  -Code sepsis activated -Goal MAP 65 or greater. Levophed ordered. Titrate medication to goal -S/P 500 ml fluid resusitation. Give additional 512ml IVF in ICU. Limiting total fluid administration due to age and cardiac history -Obtain/Follow up BC and UC -On Cefepime and Vancomycin. Narrow as cultures result -Trend lactate -Obtain procalcitonin, MRSA PCR, urine strep and legionella -Follow up CBC, CMP, Coags -Check 12 lead -transfer to ICU -Check ABG -speech consult  Afib with RVR Demand ischemia  Troponin 07>622>633. Cards suspects demand ischemia. ECHO 1/6 EF 45-50%, LV mildy decreased fxn with wall motion abnormalities per report. ? If afib secondary to sepsis. -Cardiology following -[place] held due to hypotension -Holding lopressor d/t hypotension -AC held due to elevated INR. Recheck INR. Ongoing reevaluation for [facility]. Appreciate cards assistance.  Elevated INR INR greater than 10, s/p 10mg   vit K on 1/7. On xeralto at home. Last dose 10mg  at 1040 on 1/6. Heme consulted on 1/6- suspect secondary to xeralto -Recheck INR -5 to 10 mg of Vit K based on result  -Monitor for bleeding  Hypomagnesia Hyponatremia MG 1.0>0.9, NA 951>884>166 -4mg  Mag now -Recheck on AM labs -check serum osm -Goal  K above 4, goal MG above 2  AKI Suspect secondary to sepsis. Baseline creat 0.87, noow 1.29, BUN 25 -Ensure renal perfusion. Goal MAP 65 or greater. -Avoid neprotoxic drugs as possible. -Strict I&O's -Follow up AM creatinine  Hypothyroidism -continue synthroid  HX of small bowel obstruction s/p bowel resection -monitor abdominal exam -PPI  DNR/DNI Goals of care discussion with Dr. Tamala Julian and family. Patient wishes to not undergo CPR or intubation. Ok for pressors and bipap -DNR/DNI orders placed  Best Practice (right click and "Reselect all SmartList Selections" daily)   Diet/type: NPO w/ oral meds DVT prophylaxis: SCD GI prophylaxis: PPI Lines: N/A Foley:  N/A Code Status:  DNR Last date of multidisciplinary goals of care discussion [see above]  Labs   CBC: Recent Labs  Lab 10/15/21 1602 10/17/21 0222 10/17/21 1653  WBC 12.1* 18.4* 9.1  NEUTROABS 10.5*  --   --   HGB 11.4* 9.2* 11.3*  HCT 34.9* 27.3* 34.8*  MCV 83.5 80.3 80.9  PLT 441* 272 063    Basic Metabolic Panel: Recent Labs  Lab 10/15/21 1645 10/16/21 0554 10/17/21 0222 10/17/21 1653  NA 135 141 134* 131*  K 3.8 3.7 3.2* 3.7  CL 107 106 104 99  CO2 21* 25 24 19*  GLUCOSE 97 103* 117* 168*  BUN 12 15 20  25*  CREATININE 0.87 0.87 1.08* 1.29*  CALCIUM 7.8* 7.7* 7.5* 7.8*  MG  --  1.0*  --  0.9*   GFR: Estimated Creatinine Clearance: 17.9 mL/min (A) (by C-G formula based on SCr of 1.29 mg/dL (H)). Recent Labs  Lab 10/15/21 1602 10/17/21 0222 10/17/21 1653 10/17/21 1656  WBC 12.1* 18.4* 9.1  --   LATICACIDVEN  --   --   --  3.8*    Liver Function Tests: Recent Labs  Lab 10/15/21 1645 10/17/21 1653  AST 21 36  ALT 11 17  ALKPHOS 61 169*  BILITOT 0.5 0.5  PROT 5.3* 5.9*  ALBUMIN 2.6* 2.4*   No results for input(s): LIPASE, AMYLASE in the last 168 hours. No results for input(s): AMMONIA in the last 168 hours.  ABG    Component Value Date/Time   HCO3 21.7 06/27/2019 0122    TCO2 23 06/27/2019 0122   ACIDBASEDEF 5.0 (H) 06/27/2019 0122   O2SAT 99.0 06/27/2019 0122     Coagulation Profile: Recent Labs  Lab 10/16/21 0030 10/16/21 0554 10/17/21 1112  INR >10.0* >10.0* >10.0*    Cardiac Enzymes: No results for input(s): CKTOTAL, CKMB, CKMBINDEX, TROPONINI in the last 168 hours.  HbA1C: Hgb A1c MFr Bld  Date/Time Value Ref Range Status  06/27/2019 04:46 AM 5.1 4.8 - 5.6 % Final    Comment:    (NOTE)         Prediabetes: 5.7 - 6.4         Diabetes: >6.4         Glycemic control for adults with diabetes: <7.0     CBG: Recent Labs  Lab 10/17/21 1632  GLUCAP 170*    Review of Systems:   Positives in bold  Gen: fever, chills, weight change, fatigue, night sweats HEENT:  blurred vision, double  vision, hearing loss, tinnitus, sinus congestion, rhinorrhea, sore throat, neck stiffness, dysphagia PULM:  shortness of breath, cough, sputum production, hemoptysis, wheezing CV: chest pain, edema, orthopnea, paroxysmal nocturnal dyspnea, palpitations GI:  abdominal pain, nausea, vomiting, diarrhea, hematochezia, melena, constipation, change in bowel habits GU: dysuria, hematuria, polyuria, oliguria, urethral discharge Endocrine: hot or cold intolerance, polyuria, polyphagia or appetite change Derm: rash, dry skin, scaling or peeling skin change Heme: easy bruising, bleeding, bleeding gums Neuro: headache, numbness, weakness, slurred speech, loss of memory or consciousness   Past Medical History:  She,  has a past medical history of Atrial fibrillation ([place]), Depression, Depression, Hypothyroidism, Insomnia, Left carpal tunnel syndrome, Peripheral neuropathy, S/P small bowel resection, SBO (small bowel obstruction) ([place]) (06/2019), Status post radiation therapy, Uterine cancer ([place]), and Vitamin D deficiency.   Surgical History:   Past Surgical History:  Procedure Laterality Date   ABDOMINAL HYSTERECTOMY     CATARACT EXTRACTION Bilateral       Social History:   reports that she has never smoked. She has never used smokeless tobacco. She reports that she does not drink alcohol and does not use drugs.   Family History:  Her family history includes Aneurysm in her father; Diabetes in her brother; Stroke in her mother.   Allergies Allergies  Allergen Reactions   Codeine Itching   Codeine Itching   Penicillins Swelling   Penicillins Itching and Swelling     Home Medications  Prior to Admission medications   Medication Sig Start Date End Date Taking? Authorizing Provider  acetaminophen (TYLENOL) 500 MG tablet Take 1,000 mg by mouth every 6 (six) hours as needed for mild pain or moderate pain.   Yes [provider]  benzonatate (TESSALON) 200 MG capsule Take 200 mg by mouth 3 (three) times daily as needed for cough. 10/13/21  Yes [provider]  furosemide (LASIX) 20 MG tablet Take 20 mg by mouth daily as needed for fluid.   Yes [provider]  guaiFENesin (ROBITUSSIN) 100 MG/5ML liquid Take 20 mLs by mouth every 12 (twelve) hours as needed for cough or to loosen phlegm.   Yes [provider]  loperamide (IMODIUM) 2 MG capsule Take 2 mg by mouth 3 (three) times daily.   Yes [provider]  magnesium oxide (MAG-OX) 400 (241.3 Mg) MG tablet Take 1 tablet (400 mg total) by mouth daily. Patient taking differently: Take 400 mg by mouth 2 (two) times daily. 04/01/17  Yes Nita Sells, MD  meclizine (ANTIVERT) 12.5 MG tablet Take 12.5 mg by mouth 3 (three) times daily as needed for dizziness.   Yes [provider]  metoprolol tartrate (LOPRESSOR) 25 MG tablet Take 1 tablet (25 mg total) by mouth 2 (two) times daily. 08/06/15  Yes Cherene Altes, MD  Multiple Vitamins-Minerals (MULTIVITAMIN WITH MINERALS) tablet Take 1 tablet by mouth daily.   Yes [provider]  omeprazole (PRILOSEC) 40 MG capsule Take 40 mg by mouth daily. 09/04/19  Yes [provider]   raloxifene (EVISTA) 60 MG tablet Take 60 mg by mouth daily. 02/18/20  Yes [provider]  SYNTHROID 50 MCG tablet Take 50 mcg by mouth daily. 02/04/20  Yes [provider]  VITAMIN B1-B12 IJ Inject 1 vial as directed every 21 ( twenty-one) days.   Yes [provider]  Vitamin D, Ergocalciferol, (DRISDOL) 50000 UNITS CAPS Take 50,000 Units by mouth See admin instructions. Takes on Sunday, Monday, Wednesday, Friday   Yes [provider]  Alveda Reasons 10  MG TABS tablet Take 10 mg by mouth daily. 09/22/21  Yes [provider]     Critical care time: 39 minutes     Redmond School., MSN, APRN, AGACNP-BC [facility]  10/17/2021 , 7:42 PM  Please see [email or web address] for pager details  If no response, please call [phone] After hours, please call Elink at [phone]

## 2021-10-17 NOTE — Progress Notes (Signed)
eLink Physician-Brief Progress Note Patient Name: Teresa Gonzalez DOB: 1922/04/11 MRN: [id number]   Date of Service  10/17/2021  HPI/Events of Note  88 yr old female admitted with afib rvr on amiodarone drip now with hypotension with concern for evolving sepsis related to pneumonia.  Transferred to ICU for further eval and management.  eICU Interventions  Chart reviewed. Volume resuscitation Abx Continue amio for HR control DNR/DNI     Intervention Category Evaluation Type: New Patient Evaluation  Mauri Brooklyn, P 10/17/2021, 8:47 PM

## 2021-10-17 NOTE — Progress Notes (Signed)
Pharmacy Antibiotic Note  Teresa Gonzalez is a 88 y.o. female admitted on 10/15/2021 presenting with fatigue, in afib, now febrile with LA 3.8.  Pharmacy has been consulted for vancomycin and cefepime dosing.  Plan: Vancomycin 1g IV x 1, then 500 mg IV q 36h (eAUC 530, Goal AUC 400-550, SCr 1.29) Cefepime 2g IV x 1, then 1g q 24h Monitor renal function, Cx and clinical progression to narrow Vancomycin levels as needed  Height: 5\' 4"  (162.6 cm) Weight: 45.4 kg (100 lb) IBW/kg (Calculated) : 54.7  Temp (24hrs), Avg:99.4 F (37.4 C), Min:97.5 F (36.4 C), Max:103.6 F (39.8 C)  Recent Labs  Lab 10/15/21 1602 10/15/21 1645 10/16/21 0554 10/17/21 0222 10/17/21 1653 10/17/21 1656  WBC 12.1*  --   --  18.4* 9.1  --   CREATININE  --  0.87 0.87 1.08* 1.29*  --   LATICACIDVEN  --   --   --   --   --  3.8*    Estimated Creatinine Clearance: 17.9 mL/min (A) (by C-G formula based on SCr of 1.29 mg/dL (H)).    Allergies  Allergen Reactions   Codeine Itching   Codeine Itching   Penicillins Swelling   Penicillins Itching and Swelling    Bertis Ruddy, PharmD Clinical Pharmacist ED Pharmacist Phone # [phone] 10/17/2021 7:36 PM

## 2021-10-18 DIAGNOSIS — L8990 Pressure ulcer of unspecified site, unspecified stage: Secondary | ICD-10-CM | POA: Insufficient documentation

## 2021-10-18 DIAGNOSIS — I4891 Unspecified atrial fibrillation: Secondary | ICD-10-CM | POA: Diagnosis not present

## 2021-10-18 DIAGNOSIS — A419 Sepsis, unspecified organism: Secondary | ICD-10-CM | POA: Diagnosis not present

## 2021-10-18 DIAGNOSIS — N179 Acute kidney failure, unspecified: Secondary | ICD-10-CM | POA: Diagnosis not present

## 2021-10-18 LAB — PROTIME-INR
INR: 1.9 — ABNORMAL HIGH (ref 0.8–1.2)
INR: 1.6 — ABNORMAL HIGH (ref 0.8–1.2)
Prothrombin Time: 21.5 seconds — ABNORMAL HIGH (ref 11.4–15.2)
Prothrombin Time: 19.1 seconds — ABNORMAL HIGH (ref 11.4–15.2)

## 2021-10-18 LAB — COMPREHENSIVE METABOLIC PANEL
ALT: 14 U/L (ref 0–44)
AST: 27 U/L (ref 15–41)
Albumin: 1.9 g/dL — ABNORMAL LOW (ref 3.5–5.0)
Alkaline Phosphatase: 120 U/L (ref 38–126)
Anion gap: 10 (ref 5–15)
BUN: 27 mg/dL — ABNORMAL HIGH (ref 8–23)
CO2: 19 mmol/L — ABNORMAL LOW (ref 22–32)
Calcium: 7.4 mg/dL — ABNORMAL LOW (ref 8.9–10.3)
Chloride: 100 mmol/L (ref 98–111)
Creatinine, Ser: 1.34 mg/dL — ABNORMAL HIGH (ref 0.44–1.00)
GFR, Estimated: 36 mL/min — ABNORMAL LOW (ref >60)
Glucose, Bld: 152 mg/dL — ABNORMAL HIGH (ref 70–99)
Potassium: 3.7 mmol/L (ref 3.5–5.1)
Sodium: 129 mmol/L — ABNORMAL LOW (ref 135–145)
Total Bilirubin: 0.6 mg/dL (ref 0.3–1.2)
Total Protein: 4.8 g/dL — ABNORMAL LOW (ref 6.5–8.1)

## 2021-10-18 LAB — BLOOD CULTURE ID PANEL (REFLEXED) - BCID2

## 2021-10-18 LAB — CBC
HCT: 25.9 % — ABNORMAL LOW (ref 36.0–46.0)
Hemoglobin: 8.6 g/dL — ABNORMAL LOW (ref 12.0–15.0)
MCH: 27 pg (ref 26.0–34.0)
MCHC: 33.2 g/dL (ref 30.0–36.0)
MCV: 81.2 fL (ref 80.0–100.0)
Platelets: 213 10*3/uL (ref 150–400)
RBC: 3.19 MIL/uL — ABNORMAL LOW (ref 3.87–5.11)
RDW: 16.8 % — ABNORMAL HIGH (ref 11.5–15.5)
WBC: 24.8 10*3/uL — ABNORMAL HIGH (ref 4.0–10.5)
nRBC: 0 % (ref 0.0–0.2)

## 2021-10-18 LAB — LACTIC ACID, PLASMA
Lactic Acid, Venous: 1.7 mmol/L (ref 0.5–1.9)
Lactic Acid, Venous: 1.4 mmol/L (ref 0.5–1.9)

## 2021-10-18 LAB — OSMOLALITY: Osmolality: 280 mOsm/kg (ref 275–295)

## 2021-10-18 LAB — PHOSPHORUS: Phosphorus: 2.9 mg/dL (ref 2.5–4.6)

## 2021-10-18 LAB — PROCALCITONIN: Procalcitonin: 28.65 ng/mL

## 2021-10-18 LAB — MAGNESIUM: Magnesium: 2.8 mg/dL — ABNORMAL HIGH (ref 1.7–2.4)

## 2021-10-18 MED ORDER — METOPROLOL TARTRATE 5 MG/5ML IV SOLN
5.0000 mg | Freq: Once | INTRAVENOUS | Status: AC
  Administered 2021-10-18: 5 mg via INTRAVENOUS
  Filled 2021-10-18: qty 5

## 2021-10-18 MED ORDER — PANTOPRAZOLE SODIUM 40 MG PO TBEC
40.0000 mg | DELAYED_RELEASE_TABLET | Freq: Every day | ORAL | Status: DC
  Administered 2021-10-18 – 2021-10-20 (×3): 40 mg via ORAL
  Filled 2021-10-18 (×3): qty 1

## 2021-10-18 MED ORDER — APIXABAN 2.5 MG PO TABS
2.5000 mg | ORAL_TABLET | Freq: Two times a day (BID) | ORAL | Status: DC
  Administered 2021-10-18 – 2021-10-20 (×5): 2.5 mg via ORAL
  Filled 2021-10-18 (×5): qty 1

## 2021-10-18 MED ORDER — ORAL CARE MOUTH RINSE
15.0000 mL | Freq: Two times a day (BID) | OROMUCOSAL | Status: DC
  Administered 2021-10-18 – 2021-10-19 (×3): 15 mL via OROMUCOSAL

## 2021-10-18 MED ORDER — LOPERAMIDE HCL 2 MG PO CAPS
2.0000 mg | ORAL_CAPSULE | ORAL | Status: DC | PRN
  Administered 2021-10-18 – 2021-10-19 (×2): 2 mg via ORAL
  Filled 2021-10-18 (×2): qty 1

## 2021-10-18 MED ORDER — DICLOFENAC SODIUM 1 % EX GEL
2.0000 g | Freq: Four times a day (QID) | CUTANEOUS | Status: DC
  Administered 2021-10-18 – 2021-10-20 (×7): 2 g via TOPICAL
  Filled 2021-10-18: qty 100

## 2021-10-18 NOTE — Progress Notes (Signed)
PHARMACY - PHYSICIAN COMMUNICATION CRITICAL VALUE ALERT - BLOOD CULTURE IDENTIFICATION (BCID)  Teresa Gonzalez is an 88 y.o. female who presented to [facility] on [facility]/15/[facility]21  Assessment:  58 yof presenting with afib with RVR, septic shock due to RLL PNA, suspected aspiration, ?acute on chronic UTI. Patient started on vancomycin/cefepime, now 2/2 BCx bottles growing Ecoli with no resistance detected.  Name of physician (or Provider) Contacted: 2H pharmacist to follow up with CCM team on rounds  Current antibiotics: vancomycin/cefepime  Changes to prescribed antibiotics recommended:  2H pharmacist to speak with rounding team about de-escalating to ceftriaxone 2g IV q24h  Results for orders placed or performed during the hospital encounter of 10/15/21  Blood Culture ID Panel (Reflexed) (Collected: 10/17/2021  8:50 PM)  Result Value Ref Range   Enterococcus faecalis NOT DETECTED NOT DETECTED   Enterococcus Faecium NOT DETECTED NOT DETECTED   Listeria monocytogenes NOT DETECTED NOT DETECTED   Staphylococcus species NOT DETECTED NOT DETECTED   Staphylococcus aureus (BCID) NOT DETECTED NOT DETECTED   Staphylococcus epidermidis NOT DETECTED NOT DETECTED   Staphylococcus lugdunensis NOT DETECTED NOT DETECTED   Streptococcus species NOT DETECTED NOT DETECTED   Streptococcus agalactiae NOT DETECTED NOT DETECTED   Streptococcus pneumoniae NOT DETECTED NOT DETECTED   Streptococcus pyogenes NOT DETECTED NOT DETECTED   A.calcoaceticus-baumannii NOT DETECTED NOT DETECTED   Bacteroides fragilis NOT DETECTED NOT DETECTED   Enterobacterales DETECTED (A) NOT DETECTED   Enterobacter cloacae complex NOT DETECTED NOT DETECTED   Escherichia coli DETECTED (A) NOT DETECTED   Klebsiella aerogenes NOT DETECTED NOT DETECTED   Klebsiella oxytoca NOT DETECTED NOT DETECTED   Klebsiella pneumoniae NOT DETECTED NOT DETECTED   Proteus species NOT DETECTED NOT DETECTED   Salmonella species NOT DETECTED NOT DETECTED    Serratia marcescens NOT DETECTED NOT DETECTED   Haemophilus influenzae NOT DETECTED NOT DETECTED   Neisseria meningitidis NOT DETECTED NOT DETECTED   Pseudomonas aeruginosa NOT DETECTED NOT DETECTED   Stenotrophomonas maltophilia NOT DETECTED NOT DETECTED   Candida albicans NOT DETECTED NOT DETECTED   Candida auris NOT DETECTED NOT DETECTED   Candida glabrata NOT DETECTED NOT DETECTED   Candida krusei NOT DETECTED NOT DETECTED   Candida parapsilosis NOT DETECTED NOT DETECTED   Candida tropicalis NOT DETECTED NOT DETECTED   Cryptococcus neoformans/gattii NOT DETECTED NOT DETECTED   CTX-M ESBL NOT DETECTED NOT DETECTED   Carbapenem resistance IMP NOT DETECTED NOT DETECTED   Carbapenem resistance KPC NOT DETECTED NOT DETECTED   Carbapenem resistance NDM NOT DETECTED NOT DETECTED   Carbapenem resist OXA 48 LIKE NOT DETECTED NOT DETECTED   Carbapenem resistance VIM NOT DETECTED NOT DETECTED    Stasia Cavalier Von Dohlen 10/18/2021  12:50 PM

## 2021-10-18 NOTE — Progress Notes (Signed)
Progress Note  Patient Name: Teresa Gonzalez Date of Encounter: 10/18/2021  [facility] Cardiologist: new  Subjective   Patient complains of L shoulder/arm pain   Says her breathing is OK   No CP    Inpatient Medications    Scheduled Meds:  Chlorhexidine Gluconate Cloth  6 each Topical Daily   levothyroxine  50 mcg Oral QAC breakfast   multivitamin with minerals  1 tablet Oral Daily   pantoprazole (PROTONIX) IV  40 mg Intravenous Q24H   raloxifene  60 mg Oral Daily   Continuous Infusions:  amiodarone Stopped (10/18/21 0042)   ceFEPime (MAXIPIME) IV     dextrose 5% lactated ringers 50 mL/hr at 10/18/21 0800   magnesium sulfate bolus IVPB     [START ON 10/19/2021] vancomycin     PRN Meds: acetaminophen, acetaminophen   Vital Signs    Vitals:   10/18/21 0647 10/18/21 0700 10/18/21 0800 10/18/21 0900  BP:  (!) 134/92 121/69 114/62  Pulse:   100 100  Resp: (!) 28 (!) 30 (!) 23 (!) 23  Temp:   97.9 F (36.6 C)   TempSrc:   Oral   SpO2:   100% 98%  Weight:      Height:        Intake/Output Summary (Last 24 hours) at 10/18/2021 1011 Last data filed at 10/18/2021 0900 Gross per 24 hour  Intake 1577.11 ml  Output 600 ml  Net 977.11 ml    Last 3 Weights 10/16/2021 03/14/2020 10/15/2019  Weight (lbs) 100 lb 100 lb 100 lb 9.6 oz  Weight (kg) 45.36 kg 45.36 kg 45.632 kg      Telemetry  - Personally Reviewed  ECG    No am EKG  Physical Exam   GEN: No acute distress.   Neck: No JVD Cardiac: Regular S1, S2  No significant murmurs    Respiratory: Clear to auscultation bilaterally. GI: Soft, nontender, non-distended  MS: No edema; No deformity. Neuro:  Nonfocal  Psych: Normal affect   Labs    High Sensitivity Troponin:   Recent Labs  Lab 10/15/21 1654 10/15/21 1944 10/15/21 2215  TROPONINIHS 70* 136* 141*     Chemistry Recent Labs  Lab 10/15/21 1645 10/16/21 0554 10/17/21 0222 10/17/21 1653 10/18/21 0037  NA 135 141 134* 131* 129*  K 3.8 3.7 3.2* 3.7  3.7  CL 107 106 104 99 100  CO2 21* 25 24 19* 19*  GLUCOSE 97 103* 117* 168* 152*  BUN 12 15 20  25* 27*  CREATININE 0.87 0.87 1.08* 1.29* 1.34*  CALCIUM 7.8* 7.7* 7.5* 7.8* 7.4*  MG  --  1.0*  --  0.9* 2.8*  PROT 5.3*  --   --  5.9* 4.8*  ALBUMIN 2.6*  --   --  2.4* 1.9*  AST 21  --   --  36 27  ALT 11  --   --  17 14  ALKPHOS 61  --   --  169* 120  BILITOT 0.5  --   --  0.5 0.6  GFRNONAA >60 >60 47* 38* 36*  ANIONGAP 7 10 6 13 10     Lipids No results for input(s): CHOL, TRIG, HDL, LABVLDL, LDLCALC, CHOLHDL in the last 168 hours.  Hematology Recent Labs  Lab 10/17/21 0222 10/17/21 1653 10/18/21 0037  WBC 18.4* 9.1 24.8*  RBC 3.40* 4.30 3.19*  HGB 9.2* 11.3* 8.6*  HCT 27.3* 34.8* 25.9*  MCV 80.3 80.9 81.2  MCH 27.1 26.3 27.0  MCHC 33.7 32.5 33.2  RDW 16.8* 17.2* 16.8*  PLT 272 285 213   Thyroid  Recent Labs  Lab 10/15/21 1645  FREET4 1.08    BNPNo results for input(s): BNP, PROBNP in the last 168 hours.  DDimer No results for input(s): DDIMER in the last 168 hours.   Radiology    DG Chest Port 1 View  Result Date: 10/17/2021 CLINICAL DATA:  Tachypnea EXAM: PORTABLE CHEST 1 VIEW COMPARISON:  10/15/2021, 10/14/2019 FINDINGS: Mild cardiomegaly with aortic atherosclerosis. New hazy opacity in the right infrahilar lung. No visible pneumothorax. Chronic appearing left sixth rib fracture. IMPRESSION: 1. New hazy opacity in the right infrahilar lung may reflect atelectasis, pneumonia, or aspiration. 2. Cardiomegaly Electronically Signed   By: Donavan Foil M.D.   On: 10/17/2021 17:26    Cardiac Studies   Echo  10/15/21 ompared with the echo 07/2015, wall motion abnormalities are new. Left ventricular ejection fraction, by estimation, is 45 to 50%. The left ventricle has mildly decreased function. The left ventricle demonstrates regional wall motion abnormalities (see scoring diagram/findings for description). There is moderate left ventricular hypertrophy. Left ventricular  diastolic parameters are consistent with Grade I diastolic dysfunction (impaired relaxation). 1. Right ventricular systolic function is normal. The right ventricular size is normal. There is mildly elevated pulmonary artery systolic pressure. 2. The mitral valve is normal in structure. Mild mitral valve regurgitation. No evidence of mitral stenosis. 3. The aortic valve is tricuspid. Aortic valve regurgitation is trivial. No aortic stenosis is present. 4. The inferior vena cava is normal in size with greater than 50% respiratory variability, suggesting right atrial pressure of 3 mmHg.  Patient Profile     88 y.o. female with long standing PAF on Xarelto admitted with recurrent atrial fib with RVR. Recent upper respiratory illness with residual cough.   Assessment & Plan   Events of yesterday:    When I saw her in am had mild cough   No significant SOB    I started IV amio for rate control since rates erratic 100s to 130  During afternoon got acutely SOB, tachypneic, hypotensive , tachycardic Febrile ? Aspiration.   ? Smoldering infection prior    Lactic acid 3.8   Pt transferred to ICU     IV ABX started.   Given fluid boluses.  Since then pt has improved clinically     Breathing OK on RA 98%   1  Rhythm  . Yesterday patient's rhythm/rates were extremely labile  SR/ST/afib     Amio started   Stopped when deteriorated yesterday  Would get EKG    Regular at times  Diffucult to see P waves    2  Heme   INR 1.6 today     Note drop in Hgb   8.6 today   May be dilutional as pt received 2 U PRBC     With no obvious bleeding I would recomm starting Eliquis 2.5 bid     WBC increased   Follow as Rx    For questions or updates, please contact [facility] Please consult [email or web address] for contact info under        Signed, Dorris Carnes, MD  10/18/2021, 10:11 AM

## 2021-10-18 NOTE — Consult Note (Signed)
NAME:  Teresa Gonzalez, MRN:  [id number], DOB:  08-May-1922, LOS: 1 ADMISSION DATE:  10/15/2021, CONSULTATION DATE:  10/17/2021 REFERRING MD:  RRT, CHIEF COMPLAINT:  Hypotension   History of Present Illness:   88 year old female with prior history as below significant for PAF on Xarelto admitted to cardiology 1/6 for afib with RVR.  Patient developed symptoms of feeling tremulousness, fatigue, SOB, palpitations and chest tightness at home on 1/5. She has 24 hrs caregivers who noted her HR in 150's.  Also associated URI symptoms for one month with residual cough.    On admit, she was afebrile, normal RA saturations, normo to hypertensive, and in afib with RVR.  Initial CXR with bronchitic changes.  She was given metoprolol in ER and converted to NSR.  Slight elevation in troponins.  Additionally found to have INR> 10  On 1/7 questions of coughing with liquids. New O2 requirement of 4L. Lactate now 3.8.   PCCM consulted for hypotension  Pertinent  Medical History  PAF on Xarelto, hypothyroidism, SBO/ partial small bowel resection c/b chronic diarrhea, uterine cancer, depression, peripheral neuropathy, vit D deficiency, chronic UTIs  Significant Hospital Events: Including procedures, antibiotic start and stop dates in addition to other pertinent events   1/6 Admit 1/7 PCCM consult  Interim History / Subjective:  Sleeping comfortably. Off vasopressors. No distress  Objective   Blood pressure (!) 87/48, pulse 68, temperature 97.8 F (36.6 C), temperature source Oral, resp. rate 15, height 5\' 4"  (1.626 m), weight 45.4 kg, SpO2 100 %.    4 L [place]     Intake/Output Summary (Last 24 hours) at 10/18/2021 2040 Last data filed at 10/18/2021 1800 Gross per 24 hour  Intake 2202 ml  Output 600 ml  Net 1602 ml    Filed Weights   10/16/21 0805  Weight: 45.4 kg    Examination: General: In bed, NAD, appears comfortable, elderly HEENT: MM pink/moist, anicteric, atraumatic Neuro: RASS 0, PERRL 10mm,  GCS 15 CV: S1S2, Afib, no m/r/g appreciated PULM:  air movement in all lobes, trachea midline, chest expansion symmetric GI: soft, bsx4 active, non-tender   Extremities: warm/dry, no pretibial edema, capillary refill less than 3 seconds  Skin:  no rashes or lesions noted  WBC 12.1>18.4>8.1 HGB 9.2>11.3 MG 1.0>0.9 Lactate 3.8 INR greater than 10 NA 141>134>131  Ancillary tests personally reviewed:  INR :1.6 WBC: 24.8  Assessment & Plan:  Right lower lobe pneumonia, suspect aspiration ?acute on chronic UTi  Septic shock, secondary to above, now resolved Lactic acidosis, secondary to above Afib with RVR Demand ischemia Elevated INR Hypomagnesia Hyponatremia AKI Hypothyroidism HX of small bowel obstruction s/p bowel resection  Plan:   - complete course of antibiotics for pneumonia - SLP, progressive ambulation. - Now in SR off amiodarone.   DNR/DNI Goals of care discussion with Dr. Tamala Julian and family. Patient wishes to not undergo CPR or intubation. [place] for pressors and bipap -DNR/DNI orders placed  Best Practice (right click and "Reselect all SmartList Selections" daily)   Diet/type: NPO w/ oral meds DVT prophylaxis: SCD GI prophylaxis: PPI Lines: N/A Foley:  N/A Code Status:  DNR Last date of multidisciplinary goals of care discussion [see above]  Labs   CBC: Recent Labs  Lab 10/15/21 1602 10/17/21 0222 10/17/21 1653 10/18/21 0037  WBC 12.1* 18.4* 9.1 24.8*  NEUTROABS 10.5*  --   --   --   HGB 11.4* 9.2* 11.3* 8.6*  HCT 34.9* 27.3* 34.8* 25.9*  MCV 83.5  80.3 80.9 81.2  PLT 441* 272 285 213     Basic Metabolic Panel: Recent Labs  Lab 10/15/21 1645 10/16/21 0554 10/17/21 0222 10/17/21 1653 10/18/21 0037  NA 135 141 134* 131* 129*  K 3.8 3.7 3.2* 3.7 3.7  CL 107 106 104 99 100  CO2 21* 25 24 19* 19*  GLUCOSE 97 103* 117* 168* 152*  BUN 12 15 20  25* 27*  CREATININE 0.87 0.87 1.08* 1.29* 1.34*  CALCIUM 7.8* 7.7* 7.5* 7.8* 7.4*  MG  --  1.0*   --  0.9* 2.8*  PHOS  --   --   --   --  2.9    GFR: Estimated Creatinine Clearance: 17.2 mL/min (A) (by C-G formula based on SCr of 1.34 mg/dL (H)). Recent Labs  Lab 10/15/21 1602 10/17/21 0222 10/17/21 1653 10/17/21 1656 10/17/21 2328 10/18/21 0037 10/18/21 0324  PROCALCITON  --   --  12.00  --   --  28.65  --   WBC 12.1* 18.4* 9.1  --   --  24.8*  --   LATICACIDVEN  --   --   --  3.8* 1.7  --  1.4     Liver Function Tests: Recent Labs  Lab 10/15/21 1645 10/17/21 1653 10/18/21 0037  AST 21 36 27  ALT 11 17 14   ALKPHOS 61 169* 120  BILITOT 0.5 0.5 0.6  PROT 5.3* 5.9* 4.8*  ALBUMIN 2.6* 2.4* 1.9*    No results for input(s): LIPASE, AMYLASE in the last 168 hours. No results for input(s): AMMONIA in the last 168 hours.  ABG    Component Value Date/Time   HCO3 21.7 06/27/2019 0122   TCO2 23 06/27/2019 0122   ACIDBASEDEF 5.0 (H) 06/27/2019 0122   O2SAT 99.0 06/27/2019 0122      Coagulation Profile: Recent Labs  Lab 10/16/21 0030 10/16/21 0554 10/17/21 1112 10/17/21 2328 10/18/21 0324  INR >10.0* >10.0* >10.0* 1.9* 1.6*     Cardiac Enzymes: No results for input(s): CKTOTAL, CKMB, CKMBINDEX, TROPONINI in the last 168 hours.  HbA1C: Hgb A1c MFr Bld  Date/Time Value Ref Range Status  06/27/2019 04:46 AM 5.1 4.8 - 5.6 % Final    Comment:    (NOTE)         Prediabetes: 5.7 - 6.4         Diabetes: >6.4         Glycemic control for adults with diabetes: <7.0     CBG: Recent Labs  Lab 10/17/21 1632 10/17/21 [place]   Kipp Brood, MD [facility] ICU Physician [facility]  Pager: [phone] Or Epic Secure Chat After hours: [phone].  10/18/2021, 8:45 PM

## 2021-10-18 NOTE — Plan of Care (Signed)
°  Problem: Education: Goal: Knowledge of General Education information will improve Description: Including pain rating scale, medication(s)/side effects and non-pharmacologic comfort measures Outcome: Progressing   Problem: Health Behavior/Discharge Planning: Goal: Ability to manage health-related needs will improve Outcome: Progressing   Problem: Clinical Measurements: Goal: Ability to maintain clinical measurements within normal limits will improve Outcome: Progressing Goal: Will remain free from infection Outcome: Progressing Goal: Diagnostic test results will improve Outcome: Progressing Goal: Respiratory complications will improve Outcome: Progressing Goal: Cardiovascular complication will be avoided Outcome: Progressing   Problem: Activity: Goal: Risk for activity intolerance will decrease Outcome: Progressing   Problem: Nutrition: Goal: Adequate nutrition will be maintained Outcome: Progressing   Problem: Coping: Goal: Level of anxiety will decrease Outcome: Progressing   Problem: Elimination: Goal: Will not experience complications related to bowel motility Outcome: Progressing Goal: Will not experience complications related to urinary retention Outcome: Progressing   Problem: Pain Managment: Goal: General experience of comfort will improve Outcome: Progressing   Problem: Safety: Goal: Ability to remain free from injury will improve Outcome: Progressing   Problem: Skin Integrity: Goal: Risk for impaired skin integrity will decrease Outcome: Progressing   Problem: Cardiac: Goal: Ability to achieve and maintain adequate cardiopulmonary perfusion will improve Outcome: Progressing

## 2021-10-18 NOTE — Evaluation (Signed)
Clinical/Bedside Swallow Evaluation Patient Details  Name: Teresa Gonzalez MRN: [id number] Date of Birth: [birth year]  Today's Date: 10/18/2021 Time: SLP Start Time (ACUTE ONLY): 66 SLP Stop Time (ACUTE ONLY): 55 SLP Time Calculation (min) (ACUTE ONLY): 25 min  Past Medical History:  Past Medical History:  Diagnosis Date   Atrial fibrillation ([place])    Depression    Depression    Hypothyroidism    Insomnia    Left carpal tunnel syndrome    By nerve conduction study   Peripheral neuropathy    S/P small bowel resection    with Diarrhea   SBO (small bowel obstruction) ([place]) 06/2019   Status post radiation therapy    Uterine cancer ([place])    Vitamin D deficiency    Past Surgical History:  Past Surgical History:  Procedure Laterality Date   ABDOMINAL HYSTERECTOMY     CATARACT EXTRACTION Bilateral    HPI:  Teresa Gonzalez is a 88 y.o. female with PAF (on xarelto), hypothyroidism, SBO and partial small bowel resection c/b chronic diarrhea, and remote uterine CA who is being seen 10/16/2021 for the evaluation of Afib with RVR. URI over past month with residual cough. Caregiver denies pt having any history of swallowing difficulty.    Assessment / Plan / Recommendation  Clinical Impression  Teresa Gonzalez was seen upright in chair with caregiver present, assisting in brushing teeth. Pt is very anxious to eat and reports "all I want is some coffee or tea--I love my tea." Oral mech exam reveals xerostomia with some dried oral secretions along velum and posterior pharyngeal wall. Pt with some level of confusion, as she was cued to swish and spit water with teeth brushing, but took a large bolus of water to swish and swallow and became choked. Pt had strong cough response to this event, which did not impact oxygen saturation. She quickly recovered and requested food/drink. Pt was seen with puree and graham cracker without difficulty. She was also seen with small, single sips of water via straw without  difficulty (over 10+ trials). Recommend initiation of regular consistency diet with thin liquids given STRICT aspiration precautions (cues for small sips of liquid). Will proceed with instrumental swallow evaluation tomorrow as indicated, due to multiple recent choking episodes and concern for aspiration PNA.  SLP Visit Diagnosis: Dysphagia, unspecified (R13.10)    Aspiration Risk  Moderate aspiration risk    Diet Recommendation Regular;Thin liquid   Liquid Administration via: Straw Supervision: Full supervision/cueing for compensatory strategies Compensations: Small sips/bites Postural Changes: Seated upright at 90 degrees;Remain upright for at least 30 minutes after po intake    Other  Recommendations Oral Care Recommendations: Oral care BID;Staff/trained caregiver to provide oral care    Recommendations for follow up therapy are one component of a multi-disciplinary discharge planning process, led by the attending physician.  Recommendations may be updated based on patient status, additional functional criteria and insurance authorization.  Follow up Recommendations Home health SLP      Assistance Recommended at Discharge Frequent or constant Supervision/Assistance  Functional Status Assessment Patient has had a recent decline in their functional status and/or demonstrates limited ability to make significant improvements in function in a reasonable and predictable amount of time  Frequency and Duration min 2x/week  2 weeks       Prognosis Prognosis for Safe Diet Advancement: [place] Date of Onset: 10/18/21 HPI: Teresa Gonzalez is a 88 y.o. female with PAF (  on xarelto), hypothyroidism, SBO and partial small bowel resection c/b chronic diarrhea, and remote uterine CA who is being seen 10/16/2021 for the evaluation of Afib with RVR. URI over past month with residual cough. Caregiver denies pt having any history of swallowing difficulty. Type of Study: Bedside  Swallow Evaluation Previous Swallow Assessment: 2015 MBSS WNL Diet Prior to this Study: Regular;Thin liquids Temperature Spikes Noted: Yes History of Recent Intubation: No Behavior/Cognition: Alert;Cooperative;Pleasant mood Oral Cavity Assessment: Within Functional Limits Oral Care Completed by SLP: Yes Oral Cavity - Dentition: Adequate natural dentition Vision: Functional for self-feeding Self-Feeding Abilities: Able to feed self Patient Positioning: Upright in chair Baseline Vocal Quality: Low vocal intensity Volitional Cough: Weak Volitional Swallow: Able to elicit    Oral/Motor/Sensory Function Overall Oral Motor/Sensory Function: Generalized oral weakness   Ice Chips Ice chips: Within functional limits Presentation: Spoon   Thin Liquid Thin Liquid: Within functional limits Presentation: Cup;Spoon;Straw    Nectar Thick Nectar Thick Liquid: Not tested   Honey Thick Honey Thick Liquid: Not tested   Puree Puree: Within functional limits Presentation: Spoon;Self Fed   Solid    Sher Shampine P. Kinan Safley, M.S., CCC-SLP Speech-Language Pathologist Acute Rehabilitation Services Pager: [phone]  Solid: Within functional limits Presentation: [place] 10/18/2021,11:38 AM

## 2021-10-18 NOTE — Progress Notes (Signed)
Called CCM to clarify whether the team wanted Amiodarone re-started after the completion of a LR bolus. MD stated not to re-initiate Amiodarone at this time.

## 2021-10-19 ENCOUNTER — Inpatient Hospital Stay (HOSPITAL_COMMUNITY): Payer: Medicare HMO

## 2021-10-19 DIAGNOSIS — J9601 Acute respiratory failure with hypoxia: Secondary | ICD-10-CM | POA: Diagnosis not present

## 2021-10-19 DIAGNOSIS — A4151 Sepsis due to Escherichia coli [E. coli]: Principal | ICD-10-CM

## 2021-10-19 DIAGNOSIS — R6521 Severe sepsis with septic shock: Secondary | ICD-10-CM

## 2021-10-19 DIAGNOSIS — N179 Acute kidney failure, unspecified: Secondary | ICD-10-CM | POA: Diagnosis not present

## 2021-10-19 DIAGNOSIS — I4891 Unspecified atrial fibrillation: Secondary | ICD-10-CM | POA: Diagnosis not present

## 2021-10-19 LAB — BASIC METABOLIC PANEL
Anion gap: 11 (ref 5–15)
BUN: 34 mg/dL — ABNORMAL HIGH (ref 8–23)
CO2: 20 mmol/L — ABNORMAL LOW (ref 22–32)
Calcium: 7.3 mg/dL — ABNORMAL LOW (ref 8.9–10.3)
Chloride: 106 mmol/L (ref 98–111)
Creatinine, Ser: 1.46 mg/dL — ABNORMAL HIGH (ref 0.44–1.00)
GFR, Estimated: 33 mL/min — ABNORMAL LOW (ref >60)
Glucose, Bld: 131 mg/dL — ABNORMAL HIGH (ref 70–99)
Potassium: 3.3 mmol/L — ABNORMAL LOW (ref 3.5–5.1)
Sodium: 137 mmol/L (ref 135–145)

## 2021-10-19 LAB — PROTIME-INR
INR: 1.5 — ABNORMAL HIGH (ref 0.8–1.2)
Prothrombin Time: 17.8 seconds — ABNORMAL HIGH (ref 11.4–15.2)

## 2021-10-19 LAB — GLUCOSE, CAPILLARY: Glucose-Capillary: 76 mg/dL (ref 70–99)

## 2021-10-19 LAB — CBC
HCT: 24.1 % — ABNORMAL LOW (ref 36.0–46.0)
Hemoglobin: 8.3 g/dL — ABNORMAL LOW (ref 12.0–15.0)
MCH: 26.9 pg (ref 26.0–34.0)
MCHC: 34.4 g/dL (ref 30.0–36.0)
MCV: 78 fL — ABNORMAL LOW (ref 80.0–100.0)
Platelets: 163 10*3/uL (ref 150–400)
RBC: 3.09 MIL/uL — ABNORMAL LOW (ref 3.87–5.11)
RDW: 16.9 % — ABNORMAL HIGH (ref 11.5–15.5)
WBC: 26.8 10*3/uL — ABNORMAL HIGH (ref 4.0–10.5)
nRBC: 0 % (ref 0.0–0.2)

## 2021-10-19 LAB — PROCALCITONIN: Procalcitonin: 58.61 ng/mL

## 2021-10-19 LAB — MAGNESIUM: Magnesium: 2.3 mg/dL (ref 1.7–2.4)

## 2021-10-19 MED ORDER — POTASSIUM CHLORIDE 10 MEQ/100ML IV SOLN
10.0000 meq | INTRAVENOUS | Status: AC
  Administered 2021-10-19 (×3): 10 meq via INTRAVENOUS
  Filled 2021-10-19 (×3): qty 100

## 2021-10-19 MED ORDER — SODIUM CHLORIDE 0.9 % IV SOLN
2.0000 g | INTRAVENOUS | Status: DC
  Administered 2021-10-19: 2 g via INTRAVENOUS
  Filled 2021-10-19 (×2): qty 20

## 2021-10-19 MED ORDER — MAGNESIUM SULFATE 2 GM/50ML IV SOLN
2.0000 g | Freq: Once | INTRAVENOUS | Status: AC
  Administered 2021-10-19: 2 g via INTRAVENOUS
  Filled 2021-10-19: qty 50

## 2021-10-19 NOTE — Progress Notes (Signed)
NAME:  Teresa Gonzalez, MRN:  [id number], DOB:  01-21-1922, LOS: 2 ADMISSION DATE:  10/15/2021, CONSULTATION DATE:  10/17/2021 REFERRING MD:  RRT, CHIEF COMPLAINT:  Hypotension   History of Present Illness:   88 year old female with prior history as below significant for PAF on Xarelto admitted to cardiology 1/6 for afib with RVR.  Patient developed symptoms of feeling tremulousness, fatigue, SOB, palpitations and chest tightness at home on 1/5. She has 24 hrs caregivers who noted her HR in 150's.  Also associated URI symptoms for one month with residual cough.    On admit, she was afebrile, normal RA saturations, normo to hypertensive, and in afib with RVR.  Initial CXR with bronchitic changes.  She was given metoprolol in ER and converted to NSR.  Slight elevation in troponins.  Additionally found to have INR> 10  On 1/7 questions of coughing with liquids. New O2 requirement of 4L. Lactate now 3.8.   PCCM consulted for hypotension  Pertinent  Medical History  PAF on Xarelto, hypothyroidism, SBO/ partial small bowel resection c/b chronic diarrhea, uterine cancer, depression, peripheral neuropathy, vit D deficiency, chronic UTIs  Significant Hospital Events: Including procedures, antibiotic start and stop dates in addition to other pertinent events   1/6 Admit 1/7 PCCM consult  Interim History / Subjective:  Sleeping comfortably. Off vasopressors. No distress  Objective   Blood pressure (!) 124/59, pulse 76, temperature 97.6 F (36.4 C), temperature source Oral, resp. rate 19, height 5\' 4"  (1.626 m), weight 46.1 kg, SpO2 99 %.    4 L [place]     Intake/Output Summary (Last 24 hours) at 10/19/2021 1648 Last data filed at 10/19/2021 1400 Gross per 24 hour  Intake 1446.79 ml  Output 300 ml  Net 1146.79 ml   Filed Weights   10/16/21 0805 10/19/21 0500  Weight: 45.4 kg 46.1 kg    Examination: General: Elderly, frail appearing woman lying in bed in NAD HEENT: [place]/AT, eyes anicteric.  Neuro:   hard of hearing, awake and alert, moving all extremities.  CV: S1S2,  PULM:  breathing comfortably on 2L [place]> decreased to 1L. CTAB. GI: soft, NT Extremities: minimal edema, no cyanosis Skin:  warm, dry, no rashes  I/O+ 1.1L, net +3.3L for admission  WBC 26.8 HGB 8.3/ 24.1 Mg+  2.3 Na+137 K+ 3.3 BUN 34 Cr 1.46 PCT 28.65 INR 1.5   Blood cultures> 2/2 GNR, Enterobacter Urine culture> >100K GNR  Ancillary tests personally reviewed:    Assessment & Plan:  Septic shock due to E. Coli UTI & bacteremia, shock resolved. PCT still uptrending. -ok to deescalate to ceftriaxone -stop D5w-LR  Acute respiratory failure with hypoxia Low suspicion for aspiration pneumonia, favor atelectasis -deescalate to ceftriaxone -wean supplemental O2 as able to maintain SpO2 >90%  Paroxysmal AF with RVR, now rate controlled -management per cardiology  -tele monitoring -did not tolerate amiodarone due to bradycardia  Lactic acidosis, resolved  Demand ischemia- cardiology following Elevated INR -con't monitoring -ok to resume Eliquis   Hypomagnesia Hyponatremia -monitor electrolytes  AKI, suspect ATN due to sepsis -strict I/Os -renally dose meds, avoid nephrotoxic meds -monitor  Coagulopathy with INR>10 -monitor while back on DOAC, likely was due to vit K deficiency  Hypothyroidism -synthroid  HX of small bowel obstruction s/p bowel resection -not a relevant issue this admission  Deconditioning -PT, OT, SLP  DNR/DNI Goals of care discussion with Dr. Tamala Julian and family. Patient wishes to not undergo CPR or intubation. [place] for pressors and bipap -  DNR/DNI orders placed  Daughter updated at bedside. Stable to transfer back to the floor. PCCM will sign off. Please call with questions.   Best Practice (right click and "Reselect all SmartList Selections" daily)   Diet/type: Regular consistency (see orders) DVT prophylaxis: SCD GI prophylaxis: PPI Lines: N/A Foley:  N/A Code  Status:  DNR Last date of multidisciplinary goals of care discussion [see above]  Labs   CBC: Recent Labs  Lab 10/15/21 1602 10/17/21 0222 10/17/21 1653 10/18/21 0037 10/19/21 0056  WBC 12.1* 18.4* 9.1 24.8* 26.8*  NEUTROABS 10.5*  --   --   --   --   HGB 11.4* 9.2* 11.3* 8.6* 8.3*  HCT 34.9* 27.3* 34.8* 25.9* 24.1*  MCV 83.5 80.3 80.9 81.2 78.0*  PLT 441* 272 285 213 390    Basic Metabolic Panel: Recent Labs  Lab 10/16/21 0554 10/17/21 0222 10/17/21 1653 10/18/21 0037 10/19/21 0056  NA 141 134* 131* 129* 137  K 3.7 3.2* 3.7 3.7 3.3*  CL 106 104 99 100 106  CO2 25 24 19* 19* 20*  GLUCOSE 103* 117* 168* 152* 131*  BUN 15 20 25* 27* 34*  CREATININE 0.87 1.08* 1.29* 1.34* 1.46*  CALCIUM 7.7* 7.5* 7.8* 7.4* 7.3*  MG 1.0*  --  0.9* 2.8* 2.3  PHOS  --   --   --  2.9  --    GFR: Estimated Creatinine Clearance: 16 mL/min (A) (by C-G formula based on SCr of 1.46 mg/dL (H)). Recent Labs  Lab 10/17/21 0222 10/17/21 1653 10/17/21 1656 10/17/21 2328 10/18/21 0037 10/18/21 0324 10/19/21 0056  PROCALCITON  --  12.00  --   --  28.65  --  58.61  WBC 18.4* 9.1  --   --  24.8*  --  26.8*  LATICACIDVEN  --   --  3.8* 1.7  --  1.4  --       Julian Hy, DO 10/19/21 4:48 PM [place] [facility]

## 2021-10-19 NOTE — Progress Notes (Signed)
Modified Barium Swallow Progress Note  Patient Details  Name: Teresa Gonzalez MRN: [id number] Date of Birth: 1921-12-06  Today's Date: 10/19/2021  Modified Barium Swallow completed.  Full report located under Chart Review in the Imaging Section.  Brief recommendations include the following:  Clinical Impression  Pt's oropharyngeal swallow is within gross functional limits when considering age and missing dentition. She does need additional time to adequately masticate a small bite of graham cracker, but she acknowledges this and takes the time needed to prepare and clear her food from her oral cavity. No aspiration is observed across consistencies despite throat clearing noted. Upon brief scan of the esophagus, pt was found to have barium that did not appear to exit the esophagus, but MD was not present to confirm. Note that pt reports she has had chronic, but intermittent coughing for "a long time." Suspect that she may have a more chronic esophageal component contributing to symptoms and possible post-prandial aspiration risk. SLP provided education about aspiration and esophageal precautions and encouraged her to select foods that would be soft enough for her to adequately chew. Will f/u briefly while she remains inpatient given concerns noted at bedside.   Swallow Evaluation Recommendations       SLP Diet Recommendations: Regular solids;Thin liquid   Liquid Administration via: Cup;Straw   Medication Administration: Whole meds with liquid (crush larger pills)   Supervision: Patient able to self feed;Intermittent supervision to cue for compensatory strategies   Compensations: Small sips/bites   Postural Changes: Seated upright at 90 degrees;Remain semi-upright after after feeds/meals (Comment)   Oral Care Recommendations: Oral care BID        Osie Bond., M.A. [place] Acute Rehabilitation Services Pager [phone] Office [phone]  10/19/2021,3:13 PM

## 2021-10-19 NOTE — Progress Notes (Signed)
[place] Progress Note Patient Name: Teresa Gonzalez DOB: 12-Mar-1922 MRN: [id number]   Date of Service  10/19/2021  HPI/Events of Note  AM labs back with no elink replacement orders, K+ 3.3 with GFR 33, rising Cr    1.46, was 1.3.   PIV's, taking PO's    Trending downward of Hgb today 8.3-from 8.6. no bleeding.   eICU Interventions  K replacement ordered.      Intervention Category Intermediate Interventions: Electrolyte abnormality - evaluation and management;Other:  Elmer Sow 10/19/2021, 4:35 AM

## 2021-10-19 NOTE — Care Management (Signed)
°  Transition of Care [facility]) Screening Note   Patient Details  Name: Teresa Gonzalez Date of Birth: 17-Feb-1922   [facility]) CM/SW Contact:    Bethena Roys, RN Phone Number: 10/19/2021, 4:34 PM    Transition of Care Department [facility]) has reviewed patient and no TOC needs have been identified at this time. We will continue to monitor patient advancement through interdisciplinary progression rounds. If new patient transition needs arise, please place a TOC consult.

## 2021-10-19 NOTE — Progress Notes (Signed)
Progress Note  Patient Name: Teresa Gonzalez Date of Encounter: 10/19/2021  Primary Cardiologist: None   Subjective   Patient seen and examined her bedside. Her daughter was by the bedside.  She offers no complaints at this time.  She is in atrial fibrillation, no chest pain breathing is okay.  Inpatient Medications    Scheduled Meds:  apixaban  2.5 mg Oral BID   Chlorhexidine Gluconate Cloth  6 each Topical Daily   diclofenac Sodium  2 g Topical QID   levothyroxine  50 mcg Oral QAC breakfast   mouth rinse  15 mL Mouth Rinse BID   multivitamin with minerals  1 tablet Oral Daily   pantoprazole  40 mg Oral Daily   raloxifene  60 mg Oral Daily   Continuous Infusions:  cefTRIAXone (ROCEPHIN)  IV     dextrose 5% lactated ringers 50 mL/hr at 10/19/21 0800   PRN Meds: acetaminophen, acetaminophen, loperamide   Vital Signs    Vitals:   10/19/21 0600 10/19/21 0700 10/19/21 0800 10/19/21 0900  BP: 105/70 122/61 132/65 (!) 116/58  Pulse: 78 71 85 89  Resp: (!) 28 (!) 24 14 (!) 30  Temp:  (!) 97.4 F (36.3 C)    TempSrc:  Oral    SpO2: 99% 99% 99% 99%  Weight:      Height:        Intake/Output Summary (Last 24 hours) at 10/19/2021 1037 Last data filed at 10/19/2021 0800 Gross per 24 hour  Intake 1584.2 ml  Output 300 ml  Net 1284.2 ml   Filed Weights   10/16/21 0805 10/19/21 0500  Weight: 45.4 kg 46.1 kg    Telemetry    - Personally Reviewed  ECG    None today- Personally Reviewed  Physical Exam    General: Comfortable, sitting up in a chair Head: Atraumatic, normal size  Eyes: PEERLA, EOMI  Neck: Supple, normal JVD Cardiac: Normal S1, S2; RRR; no murmurs, rubs, or gallops Lungs: Clear to auscultation bilaterally Abd: Soft, nontender, no hepatomegaly  Ext: warm, no edema Musculoskeletal: No deformities, BUE and BLE strength normal and equal Skin: Warm and dry, no rashes   Neuro: Alert and oriented to person, place, time, and situation, CNII-XII grossly  intact, no focal deficits  Psych: Normal mood and affect   Labs    Chemistry Recent Labs  Lab 10/15/21 1645 10/16/21 0554 10/17/21 1653 10/18/21 0037 10/19/21 0056  NA 135   < > 131* 129* 137  K 3.8   < > 3.7 3.7 3.3*  CL 107   < > 99 100 106  CO2 21*   < > 19* 19* 20*  GLUCOSE 97   < > 168* 152* 131*  BUN 12   < > 25* 27* 34*  CREATININE 0.87   < > 1.29* 1.34* 1.46*  CALCIUM 7.8*   < > 7.8* 7.4* 7.3*  PROT 5.3*  --  5.9* 4.8*  --   ALBUMIN 2.6*  --  2.4* 1.9*  --   AST 21  --  36 27  --   ALT 11  --  17 14  --   ALKPHOS 61  --  169* 120  --   BILITOT 0.5  --  0.5 0.6  --   GFRNONAA >60   < > 38* 36* 33*  ANIONGAP 7   < > 13 10 11    < > = values in this interval not displayed.     Hematology Recent  Labs  Lab 10/17/21 1653 10/18/21 0037 10/19/21 0056  WBC 9.1 24.8* 26.8*  RBC 4.30 3.19* 3.09*  HGB 11.3* 8.6* 8.3*  HCT 34.8* 25.9* 24.1*  MCV 80.9 81.2 78.0*  MCH 26.3 27.0 26.9  MCHC 32.5 33.2 34.4  RDW 17.2* 16.8* 16.9*  PLT 285 213 163    Cardiac EnzymesNo results for input(s): TROPONINI in the last 168 hours. No results for input(s): TROPIPOC in the last 168 hours.   BNPNo results for input(s): BNP, PROBNP in the last 168 hours.   DDimer No results for input(s): DDIMER in the last 168 hours.   Radiology    DG Chest Port 1 View  Result Date: 10/17/2021 CLINICAL DATA:  Tachypnea EXAM: PORTABLE CHEST 1 VIEW COMPARISON:  10/15/2021, 10/14/2019 FINDINGS: Mild cardiomegaly with aortic atherosclerosis. New hazy opacity in the right infrahilar lung. No visible pneumothorax. Chronic appearing left sixth rib fracture. IMPRESSION: 1. New hazy opacity in the right infrahilar lung may reflect atelectasis, pneumonia, or aspiration. 2. Cardiomegaly Electronically Signed   By: Donavan Foil M.D.   On: 10/17/2021 17:26    Cardiac Studies   Echo  10/15/21 ompared with the echo 07/2015, wall motion abnormalities are new. Left ventricular ejection fraction, by estimation, is  45 to 50%. The left ventricle has mildly decreased function. The left ventricle demonstrates regional wall motion abnormalities (see scoring diagram/findings for description). There is moderate left ventricular hypertrophy. Left ventricular diastolic parameters are consistent with Grade I diastolic dysfunction (impaired relaxation). 1. Right ventricular systolic function is normal. The right ventricular size is normal. There is mildly elevated pulmonary artery systolic pressure. 2. The mitral valve is normal in structure. Mild mitral valve regurgitation. No evidence of mitral stenosis. 3. The aortic valve is tricuspid. Aortic valve regurgitation is trivial. No aortic stenosis is present. 4. The inferior vena cava is normal in size with greater than 50% respiratory variability, suggesting right atrial pressure of 3 mmHg.  Patient Profile     88 y.o. female with longstanding paroxysmal atrial fibrillation previously was on Xarelto has now been transitioned to Eliquis, here with recurrent atrial fibrillation due to recent upper respiratory coughing.  Assessment & Plan    Paroxysmal atrial fibrillation-heart rate is labile she is not out of A. fib recently mostly sinus.  Amiodarone was stopped due to significant reaction.  She is currently rate controlled for now we will hold off on adding any AV nodal blockers given that her blood pressure is also on the lower side.  She restarted Eliquis 2.5 mg twice a day.  So far her hemoglobin is being stable. We will continue to monitor hemoglobin hopefully if this maintains to be stable she clinically is improving she could potentially be discharged home tomorrow.  CODE STATUS-DNR DVT prophylaxis-on Eliquis for atrial fibrillation   For questions or updates, please contact [facility] Please consult [email or web address] for contact info under Cardiology/STEMI.      Signed, Berniece Salines, DO  10/19/2021, 10:37 AM

## 2021-10-20 ENCOUNTER — Other Ambulatory Visit (HOSPITAL_COMMUNITY): Payer: Self-pay

## 2021-10-20 DIAGNOSIS — I4891 Unspecified atrial fibrillation: Secondary | ICD-10-CM | POA: Diagnosis not present

## 2021-10-20 LAB — MAGNESIUM: Magnesium: 2.9 mg/dL — ABNORMAL HIGH (ref 1.7–2.4)

## 2021-10-20 LAB — CBC
HCT: 25.9 % — ABNORMAL LOW (ref 36.0–46.0)
Hemoglobin: 8.8 g/dL — ABNORMAL LOW (ref 12.0–15.0)
MCH: 26.9 pg (ref 26.0–34.0)
MCHC: 34 g/dL (ref 30.0–36.0)
MCV: 79.2 fL — ABNORMAL LOW (ref 80.0–100.0)
Platelets: 175 10*3/uL (ref 150–400)
RBC: 3.27 MIL/uL — ABNORMAL LOW (ref 3.87–5.11)
RDW: 17.2 % — ABNORMAL HIGH (ref 11.5–15.5)
WBC: 18.1 10*3/uL — ABNORMAL HIGH (ref 4.0–10.5)
nRBC: 0 % (ref 0.0–0.2)

## 2021-10-20 LAB — BASIC METABOLIC PANEL
Anion gap: 9 (ref 5–15)
BUN: 33 mg/dL — ABNORMAL HIGH (ref 8–23)
CO2: 20 mmol/L — ABNORMAL LOW (ref 22–32)
Calcium: 7.8 mg/dL — ABNORMAL LOW (ref 8.9–10.3)
Chloride: 105 mmol/L (ref 98–111)
Creatinine, Ser: 1.26 mg/dL — ABNORMAL HIGH (ref 0.44–1.00)
GFR, Estimated: 39 mL/min — ABNORMAL LOW (ref >60)
Glucose, Bld: 104 mg/dL — ABNORMAL HIGH (ref 70–99)
Potassium: 3.6 mmol/L (ref 3.5–5.1)
Sodium: 134 mmol/L — ABNORMAL LOW (ref 135–145)

## 2021-10-20 LAB — CULTURE, BLOOD (ROUTINE X 2)
Special Requests: ADEQUATE
Special Requests: ADEQUATE

## 2021-10-20 LAB — LEGIONELLA PNEUMOPHILA SEROGP 1 UR AG: L. pneumophila Serogp 1 Ur Ag: NEGATIVE

## 2021-10-20 LAB — URINE CULTURE: Culture: >=100000 — AB

## 2021-10-20 MED ORDER — APIXABAN 2.5 MG PO TABS
2.5000 mg | ORAL_TABLET | Freq: Two times a day (BID) | ORAL | 3 refills | Status: AC
  Filled 2021-10-20: qty 60, 30d supply, fill #0

## 2021-10-20 MED ORDER — CEPHALEXIN 500 MG PO CAPS
500.0000 mg | ORAL_CAPSULE | Freq: Three times a day (TID) | ORAL | 0 refills | Status: AC
  Filled 2021-10-20: qty 15, 5d supply, fill #0

## 2021-10-20 NOTE — Final Progress Note (Signed)
Patien's IV's removed and AVS education given. TOC completed medication education. NT accompanied patient and family to vehicle.

## 2021-10-20 NOTE — Progress Notes (Addendum)
Progress Note  Patient Name: Teresa Gonzalez Date of Encounter: 10/20/2021  Primary Cardiologist: None   Subjective   Patient seen and examined her bedside.She was sitting up in bed when I arrived- sleeping but opened eyes to voice.   Inpatient Medications    Scheduled Meds:  apixaban  2.5 mg Oral BID   Chlorhexidine Gluconate Cloth  6 each Topical Daily   diclofenac Sodium  2 g Topical QID   levothyroxine  50 mcg Oral QAC breakfast   mouth rinse  15 mL Mouth Rinse BID   multivitamin with minerals  1 tablet Oral Daily   pantoprazole  40 mg Oral Daily   raloxifene  60 mg Oral Daily   Continuous Infusions:  cefTRIAXone (ROCEPHIN)  IV Stopped (10/19/21 2104)   dextrose 5% lactated ringers Stopped (10/19/21 2200)   PRN Meds: acetaminophen, acetaminophen, loperamide   Vital Signs    Vitals:   10/20/21 0800 10/20/21 0819 10/20/21 0900 10/20/21 1000  BP: (!) 143/55  131/64 (!) 128/58  Pulse: 81  82 70  Resp: (!) 23  18 15   Temp:  97.6 F (36.4 C)    TempSrc:  Oral    SpO2: 100%  100% 98%  Weight:      Height:        Intake/Output Summary (Last 24 hours) at 10/20/2021 1037 Last data filed at 10/20/2021 0800 Gross per 24 hour  Intake 967.63 ml  Output 300 ml  Net 667.63 ml   Filed Weights   10/16/21 0805 10/19/21 0500 10/20/21 0600  Weight: 45.4 kg 46.1 kg 48.9 kg    Telemetry    - Personally Reviewed  ECG    None today- Personally Reviewed  Physical Exam    General: Comfortable, sitting up in a chair Head: Atraumatic, normal size  Eyes: PEERLA, EOMI  Neck: Supple, normal JVD Cardiac: Normal S1, S2; RRR; no murmurs, rubs, or gallops Lungs: Clear to auscultation bilaterally Abd: Soft, nontender, no hepatomegaly  Ext: warm, no edema Musculoskeletal: No deformities, BUE and BLE strength normal and equal Skin: Warm and dry, no rashes   Neuro: Alert and oriented to person, place, time, and situation, CNII-XII grossly intact, no focal deficits  Psych:  Normal mood and affect   Labs    Chemistry Recent Labs  Lab 10/15/21 1645 10/16/21 0554 10/17/21 1653 10/18/21 0037 10/19/21 0056 10/20/21 0107  NA 135   < > 131* 129* 137 134*  K 3.8   < > 3.7 3.7 3.3* 3.6  CL 107   < > 99 100 106 105  CO2 21*   < > 19* 19* 20* 20*  GLUCOSE 97   < > 168* 152* 131* 104*  BUN 12   < > 25* 27* 34* 33*  CREATININE 0.87   < > 1.29* 1.34* 1.46* 1.26*  CALCIUM 7.8*   < > 7.8* 7.4* 7.3* 7.8*  PROT 5.3*  --  5.9* 4.8*  --   --   ALBUMIN 2.6*  --  2.4* 1.9*  --   --   AST 21  --  36 27  --   --   ALT 11  --  17 14  --   --   ALKPHOS 61  --  169* 120  --   --   BILITOT 0.5  --  0.5 0.6  --   --   GFRNONAA >60   < > 38* 36* 33* 39*  ANIONGAP 7   < > 13  10 11 9    < > = values in this interval not displayed.     Hematology Recent Labs  Lab 10/18/21 0037 10/19/21 0056 10/20/21 0107  WBC 24.8* 26.8* 18.1*  RBC 3.19* 3.09* 3.27*  HGB 8.6* 8.3* 8.8*  HCT 25.9* 24.1* 25.9*  MCV 81.2 78.0* 79.2*  MCH 27.0 26.9 26.9  MCHC 33.2 34.4 34.0  RDW 16.8* 16.9* 17.2*  PLT 213 163 175    Cardiac EnzymesNo results for input(s): TROPONINI in the last 168 hours. No results for input(s): TROPIPOC in the last 168 hours.   BNPNo results for input(s): BNP, PROBNP in the last 168 hours.   DDimer No results for input(s): DDIMER in the last 168 hours.   Radiology    DG Swallowing Func-Speech Pathology  Result Date: 10/19/2021 Table formatting from the original result was not included. Objective Swallowing Evaluation: Type of Study: MBS-Modified Barium Swallow Study  Patient Details Name: LENISHA LACAP MRN: [id number] Date of Birth: 1922/06/15 Today's Date: 10/19/2021 Time: SLP Start Time (ACUTE ONLY): 44 -SLP Stop Time (ACUTE ONLY): 5400 SLP Time Calculation (min) (ACUTE ONLY): 17 min Past Medical History: Past Medical History: Diagnosis Date  Atrial fibrillation ([place])   Depression   Depression   Hypothyroidism   Insomnia   Left carpal tunnel syndrome   By nerve  conduction study  Peripheral neuropathy   S/P small bowel resection   with Diarrhea  SBO (small bowel obstruction) ([place]) 06/2019  Status post radiation therapy   Uterine cancer ([place])   Vitamin D deficiency  Past Surgical History: Past Surgical History: Procedure Laterality Date  ABDOMINAL HYSTERECTOMY    CATARACT EXTRACTION Bilateral  HPI: SHERELLE CASTELLI is a 88 y.o. female with PAF (on xarelto), hypothyroidism, SBO and partial small bowel resection c/b chronic diarrhea, and remote uterine CA who is being seen 10/16/2021 for the evaluation of Afib with RVR. URI over past month with residual cough. Caregiver denies pt having any history of swallowing difficulty.  Subjective: alert, cooperative, says she coughs with meals intermittently, but that it is not a new problem  Recommendations for follow up therapy are one component of a multi-disciplinary discharge planning process, led by the attending physician.  Recommendations may be updated based on patient status, additional functional criteria and insurance authorization. Assessment / Plan / Recommendation Clinical Impressions 10/19/2021 Clinical Impression Pt's oropharyngeal swallow is within gross functional limits when considering age and missing dentition. She does need additional time to adequately masticate a small bite of graham cracker, but she acknowledges this and takes the time needed to prepare and clear her food from her oral cavity. No aspiration is observed across consistencies despite throat clearing noted. Upon brief scan of the esophagus, pt was found to have barium that did not appear to exit the esophagus, but MD was not present to confirm. Note that pt reports she has had chronic, but intermittent coughing for "a long time." Suspect that she may have a more chronic esophageal component contributing to symptoms and possible post-prandial aspiration risk. SLP provided education about aspiration and esophageal precautions and encouraged her to select  foods that would be soft enough for her to adequately chew. Will f/u briefly while she remains inpatient given concerns noted at bedside. SLP Visit Diagnosis Dysphagia, unspecified (R13.10) Attention and concentration deficit following -- Frontal lobe and executive function deficit following -- Impact on safety and function Mild aspiration risk;Moderate aspiration risk   Treatment Recommendations 10/19/2021 Treatment Recommendations Therapy as outlined in treatment  plan below   Prognosis 10/19/2021 Prognosis for Safe Diet Advancement Good Barriers to Reach Goals -- Barriers/Prognosis Comment -- Diet Recommendations 10/19/2021 SLP Diet Recommendations Regular solids;Thin liquid Liquid Administration via Cup;Straw Medication Administration Whole meds with liquid Compensations Small sips/bites Postural Changes Seated upright at 90 degrees;Remain semi-upright after after feeds/meals (Comment)   Other Recommendations 10/19/2021 Recommended Consults -- Oral Care Recommendations Oral care BID Other Recommendations -- Follow Up Recommendations No SLP follow up Assistance recommended at discharge Intermittent Supervision/Assistance Functional Status Assessment Patient has had a recent decline in their functional status and demonstrates the ability to make significant improvements in function in a reasonable and predictable amount of time. Frequency and Duration  10/19/2021 Speech Therapy Frequency (ACUTE ONLY) min 1 x/week Treatment Duration 1 week   Oral Phase 10/19/2021 Oral Phase WFL Oral - Pudding Teaspoon -- Oral - Pudding Cup -- Oral - Honey Teaspoon -- Oral - Honey Cup -- Oral - Nectar Teaspoon -- Oral - Nectar Cup -- Oral - Nectar Straw -- Oral - Thin Teaspoon -- Oral - Thin Cup -- Oral - Thin Straw -- Oral - Puree -- Oral - Mech Soft -- Oral - Regular -- Oral - Multi-Consistency -- Oral - Pill -- Oral Phase - Comment --  Pharyngeal Phase 10/19/2021 Pharyngeal Phase WFL Pharyngeal- Pudding Teaspoon -- Pharyngeal -- Pharyngeal-  Pudding Cup -- Pharyngeal -- Pharyngeal- Honey Teaspoon -- Pharyngeal -- Pharyngeal- Honey Cup -- Pharyngeal -- Pharyngeal- Nectar Teaspoon -- Pharyngeal -- Pharyngeal- Nectar Cup -- Pharyngeal -- Pharyngeal- Nectar Straw -- Pharyngeal -- Pharyngeal- Thin Teaspoon -- Pharyngeal -- Pharyngeal- Thin Cup -- Pharyngeal -- Pharyngeal- Thin Straw -- Pharyngeal -- Pharyngeal- Puree -- Pharyngeal -- Pharyngeal- Mechanical Soft -- Pharyngeal -- Pharyngeal- Regular -- Pharyngeal -- Pharyngeal- Multi-consistency -- Pharyngeal -- Pharyngeal- Pill -- Pharyngeal -- Pharyngeal Comment --  Cervical Esophageal Phase  10/19/2021 Cervical Esophageal Phase WFL Pudding Teaspoon -- Pudding Cup -- Honey Teaspoon -- Honey Cup -- Nectar Teaspoon -- Nectar Cup -- Nectar Straw -- Thin Teaspoon -- Thin Cup -- Thin Straw -- Puree -- Mechanical Soft -- Regular -- Multi-consistency -- Pill -- Cervical Esophageal Comment -- Osie Bond., M.A. CCC-SLP Acute Rehabilitation Services Pager [phone] Office [phone] 10/19/2021, 3:15 PM                      Cardiac Studies   Echo  10/15/21 ompared with the echo 07/2015, wall motion abnormalities are new. Left ventricular ejection fraction, by estimation, is 45 to 50%. The left ventricle has mildly decreased function. The left ventricle demonstrates regional wall motion abnormalities (see scoring diagram/findings for description). There is moderate left ventricular hypertrophy. Left ventricular diastolic parameters are consistent with Grade I diastolic dysfunction (impaired relaxation). 1. Right ventricular systolic function is normal. The right ventricular size is normal. There is mildly elevated pulmonary artery systolic pressure. 2. The mitral valve is normal in structure. Mild mitral valve regurgitation. No evidence of mitral stenosis. 3. The aortic valve is tricuspid. Aortic valve regurgitation is trivial. No aortic stenosis is present. 4. The inferior vena cava is normal in size  with greater than 50% respiratory variability, suggesting right atrial pressure of 3 mmHg.  Patient Profile     88 y.o. female with longstanding paroxysmal atrial fibrillation previously was on Xarelto has now been transitioned to Eliquis, here with recurrent atrial fibrillation due to recent upper respiratory coughing.  Assessment & Plan    Paroxysmal atrial fibrillation-She is currently rate controlled for now we will  hold off on adding any AV nodal blockers given that her blood pressure is also on the lower side.  She restarted Eliquis 2.5 mg twice a day.  So far her hemoglobin is being stable. Patient will be discharged today.  CODE STATUS-DNR DVT prophylaxis-on Eliquis for atrial fibrillation   For questions or updates, please contact [facility] Please consult [email or web address] for contact info under Cardiology/STEMI.      Signed, Berniece Salines, DO  10/20/2021, 10:37 AM

## 2021-10-20 NOTE — Discharge Summary (Signed)
Discharge Summary    Patient ID: Teresa Gonzalez MRN: [id number]; DOB: 05-17-1922  Admit date: 10/15/2021 Discharge date: 10/20/2021  PCP:  Shon Baton, MD   [facility] Providers Cardiologist:  None   {    Discharge Diagnoses    Principal Problem:   Atrial fibrillation with rapid ventricular response [facility]) Active Problems:   Atrial fibrillation ([place])   Sepsis ([place])   AKI (acute kidney injury) ([place])   Pressure injury of skin   Supratherapeutic PT/INR and PTT Right lower lobe pneumonia with suspected aspiration E.coli UTI bacteremia  Anemia  Hypothyroidism  Diagnostic Studies/Procedures    Echo 10/16/21  1. Compared with the echo 07/2015, wall motion abnormalities are new.  Left ventricular ejection fraction, by estimation, is 45 to 50%. The left  ventricle has mildly decreased function. The left ventricle demonstrates  regional wall motion abnormalities  (see scoring diagram/findings for description). There is moderate left  ventricular hypertrophy. Left ventricular diastolic parameters are  consistent with Grade I diastolic dysfunction (impaired relaxation).   2. Right ventricular systolic function is normal. The right ventricular  size is normal. There is mildly elevated pulmonary artery systolic  pressure.   3. The mitral valve is normal in structure. Mild mitral valve  regurgitation. No evidence of mitral stenosis.   4. The aortic valve is tricuspid. Aortic valve regurgitation is trivial.  No aortic stenosis is present.   5. The inferior vena cava is normal in size with greater than 50%  respiratory variability, suggesting right atrial pressure of 3 mmHg.   History of Present Illness     Teresa Gonzalez is a 88 y.o. female with PAF (on xarelto), hypothyroidism, SBO and partial small bowel resection c/b chronic diarrhea, and remote uterine CA admitted with afib RVR.   Patient presented with fatigue, SOB, palpitations and chest tightness. She has caregivers who work  with her 24/7 that evaluated her and noted that she was tachycardic to the 150s. Given her tachycardia and symptoms, the patient was brought to the ED. Note that she reports not having issues with her Afib in several months. She takes her metoprolol and xarelto as prescribed with the assistance of her caregivers and has not missed any doses. She does report having URI symptoms since last month with a residual cough. Prior COVID and flu testing were negative per her daughter's report. She has been taking tessalon pearls recently for the cough, but otherwise has been doing well without any major health changes. She denies tobacco, ETOH or illicit drug use.    In the ED her VS were afebrile, HR 138, BP 164/105, RR 23 and satting 99% on RA. Labs notable for WBC 12.1. hbg 11.4, platelets 441, and troponins 70 -> 136. EKG showed Afib with RVR. CXR showed bronchitic changes without focal opacification. In the ED she was given 5 mg IV metoprolol x1 and converted to NSR. Cardiology is consulted for her elevated troponin and Afib.    Hospital Course     Consultants: PCCM & Hematomy  Atrial fibrillation with RVR  -The patient was converted to sinus rhythm after IV metoprolol in emergency room.  Started on p.o. metoprolol however heart rate remained labile with recurrent atrial fibrillation.  She was started on amiodarone however acutely became short of breath, hypotensive and tachypneic.  There was concern for aspiration with lactic acid of 3.8.  Patient was transferred to ICU and seen by critical care service.  Given gentle fluids. Stopped amiodarone and metoprolol.Her echocardiogram showed  mildly reduced LV function at 45 to 50% and wall motion abnormality.  Xarelto changed to Eliquis for supratherapeutic INR per hematology.  The patient mostly remain in sinus rhythm with brief A. fib episode.  Heart rate relatively stable without addition of AV nodal blocking agent.  Hemoglobin remained stable at 8.8.     Supratherapeutic PT/INR and PTT -Patient was seen by hematology for supratherapeutic PT/INR (INR of 10). Did not felt any obvious underlying liver disease given normal LFTs and bilirubin. It was felt that her elevated PT/PTT are results of Xarelto, malnutrition and possible vitK deficiency from chronic diarrhea.  Started on low-dose Eliquis per hematology recommendations.   3. Elevated Troponin  -Felt demand ischemia from elevated heart rate  4.  Sepsis/right lower lobe pneumonia with suspected aspiration/Ecoli UTI bacteremia  -Acute onset shortness of breath hypotensive with elevated lactic acid after starting amiodarone.  Seen by critical care team.  Also has choking spell.  Treated with IV antibiotic and transitioned to PO antibiotics at discharge.   5.  Anemia -Baseline hemoglobin around 10.  After starting Eliquis hemoglobin remained stable above 8.  No obvious signs of bleeding. Hx of hemorrhoids.   6.  Hypothyroidism -Continue home Synthroid  7. AKI - Suspected from ATN due to sepsis  - Hydrated. Avoid nephrotoxic agent  - Scr stable at 1.26 at discharge   8. Code DNR/DNI  The patient been seen by Dr. Harriet Masson  today and deemed ready for discharge home. All follow-up appointments have been scheduled. Discharge medications are listed below.    They patient will follow up in afib clinic, likely once. Close follow up with PCP later this week. Discussed with daughter and care giver at bedside.   Did the patient have an acute coronary syndrome (MI, NSTEMI, STEMI, etc) this admission?:  No                               Did the patient have a percutaneous coronary intervention (stent / angioplasty)?: No   _____________  Discharge Vitals Blood pressure (!) 128/58, pulse 70, temperature 98 F (36.7 C), temperature source Oral, resp. rate 15, height 5\' 4"  (1.626 m), weight 48.9 kg, SpO2 98 %.  Filed Weights   10/16/21 0805 10/19/21 0500 10/20/21 0600  Weight: 45.4 kg 46.1 kg 48.9 kg     Labs & Radiologic Studies    CBC Recent Labs    10/19/21 0056 10/20/21 0107  WBC 26.8* 18.1*  HGB 8.3* 8.8*  HCT 24.1* 25.9*  MCV 78.0* 79.2*  PLT 163 254   Basic Metabolic Panel Recent Labs    10/18/21 0037 10/19/21 0056 10/20/21 0107  NA 129* 137 134*  K 3.7 3.3* 3.6  CL 100 106 105  CO2 19* 20* 20*  GLUCOSE 152* 131* 104*  BUN 27* 34* 33*  CREATININE 1.34* 1.46* 1.26*  CALCIUM 7.4* 7.3* 7.8*  MG 2.8* 2.3 2.9*  PHOS 2.9  --   --    Liver Function Tests Recent Labs    10/17/21 1653 10/18/21 0037  AST 36 27  ALT 17 14  ALKPHOS 169* 120  BILITOT 0.5 0.6  PROT 5.9* 4.8*  ALBUMIN 2.4* 1.9*   No results for input(s): LIPASE, AMYLASE in the last 72 hours. High Sensitivity Troponin:   Recent Labs  Lab 10/15/21 1654 10/15/21 1944 10/15/21 2215  TROPONINIHS 70* 136* 141*   _____________  DG Chest Port 1 View  Result  Date: 10/17/2021 CLINICAL DATA:  Tachypnea EXAM: PORTABLE CHEST 1 VIEW COMPARISON:  10/15/2021, 10/14/2019 FINDINGS: Mild cardiomegaly with aortic atherosclerosis. New hazy opacity in the right infrahilar lung. No visible pneumothorax. Chronic appearing left sixth rib fracture. IMPRESSION: 1. New hazy opacity in the right infrahilar lung may reflect atelectasis, pneumonia, or aspiration. 2. Cardiomegaly Electronically Signed   By: Donavan Foil M.D.   On: 10/17/2021 17:26   DG Chest Portable 1 View  Result Date: 10/15/2021 CLINICAL DATA:  Tachycardia, arrhythmia EXAM: PORTABLE CHEST 1 VIEW COMPARISON:  Portable exam 1615 hours compared to 10/14/2019 FINDINGS: Normal heart size, mediastinal contours, and pulmonary vascularity. Atherosclerotic calcification aorta. Bronchitic changes with chronic accentuation of medial RIGHT basilar markings. No acute infiltrate, pleural effusion, or pneumothorax. Bones demineralized with thoracolumbar scoliosis. IMPRESSION: Bronchitic changes with chronic accentuation of RIGHT infrahilar markings. No acute abnormality  Electronically Signed   By: Lavonia Dana M.D.   On: 10/15/2021 16:21   DG Swallowing Func-Speech Pathology  Result Date: 10/19/2021 Table formatting from the original result was not included. Objective Swallowing Evaluation: Type of Study: MBS-Modified Barium Swallow Study  Patient Details Name: Teresa Gonzalez MRN: [id number] Date of Birth: 1922/03/16 Today's Date: 10/19/2021 Time: SLP Start Time (ACUTE ONLY): 63 -SLP Stop Time (ACUTE ONLY): 6789 SLP Time Calculation (min) (ACUTE ONLY): 17 min Past Medical History: Past Medical History: Diagnosis Date  Atrial fibrillation ([place])   Depression   Depression   Hypothyroidism   Insomnia   Left carpal tunnel syndrome   By nerve conduction study  Peripheral neuropathy   S/P small bowel resection   with Diarrhea  SBO (small bowel obstruction) ([place]) 06/2019  Status post radiation therapy   Uterine cancer ([place])   Vitamin D deficiency  Past Surgical History: Past Surgical History: Procedure Laterality Date  ABDOMINAL HYSTERECTOMY    CATARACT EXTRACTION Bilateral  HPI: Teresa Gonzalez is a 88 y.o. female with PAF (on xarelto), hypothyroidism, SBO and partial small bowel resection c/b chronic diarrhea, and remote uterine CA who is being seen 10/16/2021 for the evaluation of Afib with RVR. URI over past month with residual cough. Caregiver denies pt having any history of swallowing difficulty.  Subjective: alert, cooperative, says she coughs with meals intermittently, but that it is not a new problem  Recommendations for follow up therapy are one component of a multi-disciplinary discharge planning process, led by the attending physician.  Recommendations may be updated based on patient status, additional functional criteria and insurance authorization. Assessment / Plan / Recommendation Clinical Impressions 10/19/2021 Clinical Impression Pt's oropharyngeal swallow is within gross functional limits when considering age and missing dentition. She does need additional time to adequately  masticate a small bite of graham cracker, but she acknowledges this and takes the time needed to prepare and clear her food from her oral cavity. No aspiration is observed across consistencies despite throat clearing noted. Upon brief scan of the esophagus, pt was found to have barium that did not appear to exit the esophagus, but MD was not present to confirm. Note that pt reports she has had chronic, but intermittent coughing for "a long time." Suspect that she may have a more chronic esophageal component contributing to symptoms and possible post-prandial aspiration risk. SLP provided education about aspiration and esophageal precautions and encouraged her to select foods that would be soft enough for her to adequately chew. Will f/u briefly while she remains inpatient given concerns noted at bedside. SLP Visit Diagnosis Dysphagia, unspecified (R13.10) Attention and concentration  deficit following -- Frontal lobe and executive function deficit following -- Impact on safety and function Mild aspiration risk;Moderate aspiration risk   Treatment Recommendations 10/19/2021 Treatment Recommendations Therapy as outlined in treatment plan below   Prognosis 10/19/2021 Prognosis for Safe Diet Advancement Good Barriers to Reach Goals -- Barriers/Prognosis Comment -- Diet Recommendations 10/19/2021 SLP Diet Recommendations Regular solids;Thin liquid Liquid Administration via Cup;Straw Medication Administration Whole meds with liquid Compensations Small sips/bites Postural Changes Seated upright at 90 degrees;Remain semi-upright after after feeds/meals (Comment)   Other Recommendations 10/19/2021 Recommended Consults -- Oral Care Recommendations Oral care BID Other Recommendations -- Follow Up Recommendations No SLP follow up Assistance recommended at discharge Intermittent Supervision/Assistance Functional Status Assessment Patient has had a recent decline in their functional status and demonstrates the ability to make significant  improvements in function in a reasonable and predictable amount of time. Frequency and Duration  10/19/2021 Speech Therapy Frequency (ACUTE ONLY) min 1 x/week Treatment Duration 1 week   Oral Phase 10/19/2021 Oral Phase WFL Oral - Pudding Teaspoon -- Oral - Pudding Cup -- Oral - Honey Teaspoon -- Oral - Honey Cup -- Oral - Nectar Teaspoon -- Oral - Nectar Cup -- Oral - Nectar Straw -- Oral - Thin Teaspoon -- Oral - Thin Cup -- Oral - Thin Straw -- Oral - Puree -- Oral - Mech Soft -- Oral - Regular -- Oral - Multi-Consistency -- Oral - Pill -- Oral Phase - Comment --  Pharyngeal Phase 10/19/2021 Pharyngeal Phase WFL Pharyngeal- Pudding Teaspoon -- Pharyngeal -- Pharyngeal- Pudding Cup -- Pharyngeal -- Pharyngeal- Honey Teaspoon -- Pharyngeal -- Pharyngeal- Honey Cup -- Pharyngeal -- Pharyngeal- Nectar Teaspoon -- Pharyngeal -- Pharyngeal- Nectar Cup -- Pharyngeal -- Pharyngeal- Nectar Straw -- Pharyngeal -- Pharyngeal- Thin Teaspoon -- Pharyngeal -- Pharyngeal- Thin Cup -- Pharyngeal -- Pharyngeal- Thin Straw -- Pharyngeal -- Pharyngeal- Puree -- Pharyngeal -- Pharyngeal- Mechanical Soft -- Pharyngeal -- Pharyngeal- Regular -- Pharyngeal -- Pharyngeal- Multi-consistency -- Pharyngeal -- Pharyngeal- Pill -- Pharyngeal -- Pharyngeal Comment --  Cervical Esophageal Phase  10/19/2021 Cervical Esophageal Phase WFL Pudding Teaspoon -- Pudding Cup -- Honey Teaspoon -- Honey Cup -- Nectar Teaspoon -- Nectar Cup -- Nectar Straw -- Thin Teaspoon -- Thin Cup -- Thin Straw -- Puree -- Mechanical Soft -- Regular -- Multi-consistency -- Pill -- Cervical Esophageal Comment -- Osie Bond., M.A. [place] [facility] Pager [phone] Office [phone] 10/19/2021, 3:15 PM                     ECHOCARDIOGRAM COMPLETE  Result Date: 10/16/2021    ECHOCARDIOGRAM REPORT   Patient Name:   Teresa Gonzalez Date of Exam: 10/16/2021 Medical Rec #:  [id number]     Height:       64.0 in Accession #:    [id number]    Weight:       100.0 lb  Date of Birth:  October 07, 1922     BSA:          1.457 m Patient Age:    68 years      BP:           112/69 mmHg Patient Gender: F             HR:           87 bpm. Exam Location:  Inpatient Procedure: 2D Echo Indications:    Atrial fibrillation  History:        Patient has no prior history of Echocardiogram examinations.  Sonographer:    SP Referring Phys: [id number] Hershal Coria IMPRESSIONS  1. Compared with the echo 07/2015, wall motion abnormalities are new. Left ventricular ejection fraction, by estimation, is 45 to 50%. The left ventricle has mildly decreased function. The left ventricle demonstrates regional wall motion abnormalities (see scoring diagram/findings for description). There is moderate left ventricular hypertrophy. Left ventricular diastolic parameters are consistent with Grade I diastolic dysfunction (impaired relaxation).  2. Right ventricular systolic function is normal. The right ventricular size is normal. There is mildly elevated pulmonary artery systolic pressure.  3. The mitral valve is normal in structure. Mild mitral valve regurgitation. No evidence of mitral stenosis.  4. The aortic valve is tricuspid. Aortic valve regurgitation is trivial. No aortic stenosis is present.  5. The inferior vena cava is normal in size with greater than 50% respiratory variability, suggesting right atrial pressure of 3 mmHg. FINDINGS  Left Ventricle: Compared with the echo 07/2015, wall motion abnormalities are new. Left ventricular ejection fraction, by estimation, is 45 to 50%. The left ventricle has mildly decreased function. The left ventricle demonstrates regional wall motion abnormalities. The left ventricular internal cavity size was normal in size. There is moderate left ventricular hypertrophy. Left ventricular diastolic parameters are consistent with Grade I diastolic dysfunction (impaired relaxation). Normal left ventricular filling pressure.  LV Wall Scoring: The entire anterior wall, entire  septum, apical lateral segment, and apex are hypokinetic. The antero-lateral wall, entire inferior wall, and posterior wall are normal. Right Ventricle: The right ventricular size is normal. No increase in right ventricular wall thickness. Right ventricular systolic function is normal. There is mildly elevated pulmonary artery systolic pressure. The tricuspid regurgitant velocity is 3.08  m/s, and with an assumed right atrial pressure of 3 mmHg, the estimated right ventricular systolic pressure is 84.1 mmHg. Left Atrium: Left atrial size was normal in size. Right Atrium: Right atrial size was normal in size. Pericardium: Trivial pericardial effusion is present. Mitral Valve: The mitral valve is normal in structure. Mild mitral valve regurgitation. No evidence of mitral valve stenosis. Tricuspid Valve: The tricuspid valve is normal in structure. Tricuspid valve regurgitation is mild . No evidence of tricuspid stenosis. Aortic Valve: The aortic valve is tricuspid. Aortic valve regurgitation is trivial. No aortic stenosis is present. Aortic valve mean gradient measures 6.0 mmHg. Aortic valve peak gradient measures 11.2 mmHg. Aortic valve area, by VTI measures 2.10 cm. Pulmonic Valve: The pulmonic valve was normal in structure. Pulmonic valve regurgitation is trivial. No evidence of pulmonic stenosis. Aorta: The aortic root is normal in size and structure. Venous: The inferior vena cava is normal in size with greater than 50% respiratory variability, suggesting right atrial pressure of 3 mmHg. IAS/Shunts: No atrial level shunt detected by color flow Doppler.  LEFT VENTRICLE PLAX 2D LVIDd:         3.70 cm   Diastology LVIDs:         2.70 cm   LV e' medial:    2.83 cm/s LV PW:         1.20 cm   LV E/e' medial:  22.4 LV IVS:        1.40 cm   LV e' lateral:   7.51 cm/s LVOT diam:     1.90 cm   LV E/e' lateral: 8.4 LV SV:         55 LV SV Index:   38 LVOT Area:     2.84 cm  RIGHT VENTRICLE  IVC RV S prime:      15.00 cm/s  IVC diam: 1.70 cm TAPSE (M-mode): 2.5 cm LEFT ATRIUM             Index        RIGHT ATRIUM           Index LA diam:        3.60 cm 2.47 cm/m   RA Area:     13.60 cm LA Vol (A2C):   27.6 ml 18.94 ml/m  RA Volume:   33.90 ml  23.26 ml/m LA Vol (A4C):   42.1 ml 28.89 ml/m LA Biplane Vol: 34.3 ml 23.54 ml/m  AORTIC VALVE AV Area (Vmax):    2.21 cm AV Area (Vmean):   2.06 cm AV Area (VTI):     2.10 cm AV Vmax:           167.00 cm/s AV Vmean:          113.000 cm/s AV VTI:            0.262 m AV Peak Grad:      11.2 mmHg AV Mean Grad:      6.0 mmHg LVOT Vmax:         130.00 cm/s LVOT Vmean:        82.000 cm/s LVOT VTI:          0.194 m LVOT/AV VTI ratio: 0.74  AORTA Ao Root diam: 2.70 cm Ao Asc diam:  2.90 cm MITRAL VALVE               TRICUSPID VALVE MV Area (PHT): 3.99 cm    TR Peak grad:   37.9 mmHg MV Decel Time: 190 msec    TR Vmax:        308.00 cm/s MV E velocity: 63.40 cm/s MV A velocity: 78.00 cm/s  SHUNTS MV E/A ratio:  0.81        Systemic VTI:  0.19 m                            Systemic Diam: 1.90 cm Skeet Latch MD Electronically signed by Skeet Latch MD Signature Date/Time: 10/16/2021/4:58:26 PM    Final    Disposition   Pt is being discharged home today in good condition.  Follow-up Plans & Appointments     Follow-up Information     [facility] Follow up on 11/03/2021.   Specialty: Cardiology Why: @2 :30pm for hospital follow up. Please call for parking direction and code Contact information: [address] [id number] [place] [phone]               Discharge Instructions     Amb referral to AFIB Clinic   Complete by: As directed    Diet - low sodium heart healthy   Complete by: As directed    Increase activity slowly   Complete by: As directed    No wound care   Complete by: As directed        Discharge Medications   Allergies as of 10/20/2021       Reactions   Codeine  Itching   Codeine Itching   Penicillins Swelling   Penicillins Itching, Swelling        Medication List     STOP taking these medications    metoprolol tartrate 25 MG tablet Commonly known as: LOPRESSOR   Xarelto 10 MG Tabs tablet Generic drug:  rivaroxaban       TAKE these medications    acetaminophen 500 MG tablet Commonly known as: TYLENOL Take 1,000 mg by mouth every 6 (six) hours as needed for mild pain or moderate pain.   apixaban 2.5 MG Tabs tablet Commonly known as: ELIQUIS Take 1 tablet (2.5 mg total) by mouth 2 (two) times daily.   benzonatate 200 MG capsule Commonly known as: TESSALON Take 200 mg by mouth 3 (three) times daily as needed for cough.   cephALEXin 500 MG capsule Commonly known as: KEFLEX Take 1 capsule (500 mg total) by mouth 3 (three) times daily for 5 days.   furosemide 20 MG tablet Commonly known as: LASIX Take 20 mg by mouth daily as needed for fluid.   guaiFENesin 100 MG/5ML liquid Commonly known as: ROBITUSSIN Take 20 mLs by mouth every 12 (twelve) hours as needed for cough or to loosen phlegm.   loperamide 2 MG capsule Commonly known as: IMODIUM Take 2 mg by mouth 3 (three) times daily.   magnesium oxide 400 (241.3 Mg) MG tablet Commonly known as: MAG-OX Take 1 tablet (400 mg total) by mouth daily. What changed: when to take this   meclizine 12.5 MG tablet Commonly known as: ANTIVERT Take 12.5 mg by mouth 3 (three) times daily as needed for dizziness.   multivitamin with minerals tablet Take 1 tablet by mouth daily.   omeprazole 40 MG capsule Commonly known as: PRILOSEC Take 40 mg by mouth daily.   raloxifene 60 MG tablet Commonly known as: EVISTA Take 60 mg by mouth daily.   Synthroid 50 MCG tablet Generic drug: levothyroxine Take 50 mcg by mouth daily.   VITAMIN B1-B12 IJ Inject 1 vial as directed every 21 ( twenty-one) days.   Vitamin D (Ergocalciferol) 1.25 MG (50000 UNIT) Caps capsule Commonly known as:  DRISDOL Take 50,000 Units by mouth See admin instructions. Takes on Sunday, Monday, Wednesday, Friday         Outstanding Labs/Studies   CBC and BMP  during PCP follow up   Duration of Discharge Encounter   Greater than 30 minutes including physician time.  Jarrett Soho, PA 10/20/2021, 11:18 AM

## 2021-10-20 NOTE — TOC Benefit Eligibility Note (Signed)
Patient Teacher, English as a foreign language completed.    The patient is currently admitted and upon discharge could be taking Eliquis 2.5 mg.  The current 30 day co-pay is, $45.00.   The patient is insured through [place], [place] Patient Advocate Specialist [place] Patient Advocate Team Direct Number: [phone]  Fax: [phone]

## 2021-10-20 NOTE — Evaluation (Signed)
Physical Therapy Evaluation Patient Details Name: Teresa Gonzalez MRN: [id number] DOB: Nov 20, 1921 Today's Date: 10/20/2021  History of Present Illness  Pt adm 1/6 with afib with RVR. On 1/7 pt with coughing/choking episode and incr O2 needs. Pt with probable aspiration PNA and developing septic shock. Pt also with UTI. PMH - afib, uterine CA, bowel resection, peripheral neuropathy  Clinical Impression  Pt presents to PT with decreased mobility due to decreased strength, decreased balance, and decreased functional activity tolerance. Pt has 24 personal caregiver support at home. She can benefit from HHPT to assist with return to her more mobile baseline enabling her to get up at home more independently.        Recommendations for follow up therapy are one component of a multi-disciplinary discharge planning process, led by the attending physician.  Recommendations may be updated based on patient status, additional functional criteria and insurance authorization.  Follow Up Recommendations Home health PT    Assistance Recommended at Discharge Frequent or constant Supervision/Assistance  Patient can return home with the following  A lot of help with walking and/or transfers;Assist for transportation;A lot of help with bathing/dressing/bathroom;Assistance with cooking/housework    Equipment Recommendations None recommended by PT  Recommendations for Other Services       Functional Status Assessment Patient has had a recent decline in their functional status and/or demonstrates limited ability to make significant improvements in function in a reasonable and predictable amount of time     Precautions / Restrictions Precautions Precautions: Fall      Mobility  Bed Mobility               General bed mobility comments: Pt up in chair    Transfers Overall transfer level: Needs assistance Equipment used: Rolling walker (2 wheels) Transfers: Sit to/from Stand Sit to Stand: Mod  assist           General transfer comment: Assist to bring hips up and for balance. Blocking feet due to sliding. Performed multiple times due to pericare after incontinent of stool    Ambulation/Gait                  Stairs            Wheelchair Mobility    Modified Rankin (Stroke Patients Only)       Balance Overall balance assessment: Needs assistance Sitting-balance support: Bilateral upper extremity supported;Feet supported Sitting balance-Leahy Scale: Poor Sitting balance - Comments: UE support and min to min guard Postural control: Posterior lean Standing balance support: Bilateral upper extremity supported;During functional activity;Reliant on assistive device for balance Standing balance-Leahy Scale: Poor Standing balance comment: walker and min to mod assist for static standing. Pt stood for 3-4 minutes for extensive pericare                             Pertinent Vitals/Pain      Home Living Family/patient expects to be discharged to:: Private residence Living Arrangements: Other (Comment) (Has 24 hour caregivers) Available Help at Discharge: Personal care attendant;Available 24 hours/day Type of Home: House Home Access: Level entry       Home Layout: Two level;Able to live on main level with bedroom/bathroom Home Equipment: Rollator (4 wheels);Transport chair;Shower seat - built in;Grab bars - tub/shower;Grab bars - toilet;Hand held shower head;Wheelchair - manual;Cane - single point      Prior Function Prior Level of Function : Needs assist  Physical Assist : Mobility (physical);ADLs (physical) Mobility (physical): Bed mobility;Transfers ADLs (physical): Bathing;Dressing;Toileting Mobility Comments: Has been performing stand pivot to/from the w/c with min guard. Was ambulating short distances until 3-4 months ago ADLs Comments: Caregivers assist with bathing, dressing, and toileting     Hand Dominance   Dominant  Hand: Right    Extremity/Trunk Assessment   Upper Extremity Assessment Upper Extremity Assessment: Generalized weakness    Lower Extremity Assessment Lower Extremity Assessment: Generalized weakness       Communication   Communication: HOH  Cognition Arousal/Alertness: Awake/alert Behavior During Therapy: WFL for tasks assessed/performed Overall Cognitive Status: History of cognitive impairments - at baseline                                          General Comments General comments (skin integrity, edema, etc.): SpO2 >90% on RA at rest and with activity    Exercises     Assessment/Plan    PT Assessment All further PT needs can be met in the next venue of care  PT Problem List Decreased strength;Decreased activity tolerance;Decreased balance;Decreased mobility       PT Treatment Interventions      PT Goals (Current goals can be found in the Care Plan section)  Acute Rehab PT Goals Patient Stated Goal: go home PT Goal Formulation: All assessment and education complete, DC therapy    Frequency       Co-evaluation               AM-PAC PT "6 Clicks" Mobility  Outcome Measure Help needed turning from your back to your side while in a flat bed without using bedrails?: A Lot Help needed moving from lying on your back to sitting on the side of a flat bed without using bedrails?: A Lot Help needed moving to and from a bed to a chair (including a wheelchair)?: A Lot Help needed standing up from a chair using your arms (e.g., wheelchair or bedside chair)?: A Lot Help needed to walk in hospital room?: Total Help needed climbing 3-5 steps with a railing? : Total 6 Click Score: 10    End of Session   Activity Tolerance: Patient limited by fatigue Patient left: in chair;with call bell/phone within reach;with nursing/sitter in room;with family/visitor present   PT Visit Diagnosis: Other abnormalities of gait and mobility (R26.89);Muscle weakness  (generalized) (M62.81)    Time: 1110-1146 PT Time Calculation (min) (ACUTE ONLY): 36 min   Charges:   PT Evaluation $PT Eval Moderate Complexity: 1 Mod PT Treatments $Therapeutic Activity: 8-22 mins        [place] Pager [phone] Office [place] 10/20/2021, 12:02 PM

## 2021-10-20 NOTE — Discharge Instructions (Signed)

## 2021-10-20 NOTE — TOC Initial Note (Signed)
Transition of Care [facility]) - Initial/Assessment Note    Patient Details  Name: Teresa Gonzalez MRN: [id number] Date of Birth: 1922-08-04  [facility] CM/SW Contact:    Bethena Roys, RN Phone Number: 10/20/2021, 12:10 PM  Clinical Narrative:  Case Manager received notification that the patient will need home health physical therapy. Case Manager spoke with patient and daughter Teresa Gonzalez. Daughter Teresa Gonzalez is agreeable to home health. Medicare.gov list provided and choice is [place]. Referral submitted to [facility] and they will be able to service the patient within 24-48 hours post transition home. Per daughter, patient has durable medical equipment (DME) cane and rolling walker in the home. No DME needs identified. No further needs identified for home at this time.              Expected Discharge Plan: [place] Barriers to Discharge: No Barriers Identified   Patient Goals and CMS Choice Patient states their goals for this hospitalization and ongoing recovery are:: to return home with home health services. CMS Medicare.gov Compare Post Acute Care list provided to:: Patient Choice offered to / list presented to : Adult Children  Expected Discharge Plan and Services Expected Discharge Plan: [place]   Discharge Planning Services: CM Consult Post Acute Care Choice: [place] arrangements for the past 2 months: Single Family Home Expected Discharge Date: 10/20/21               DME Arranged: N/A DME Agency: NA       HH Arranged: PT HH Agency: [place] Date [place]: 10/20/21 Time HH Agency Contacted: 1200 Representative spoke with at [place]: [place] Arrangements/Services Living arrangements for the past 2 months: [place] with:: Adult Children Patient language and need for interpreter reviewed:: Yes Do you feel safe going back to the place  where you live?: Yes      Need for Family Participation in Patient Care: Yes (Comment) Care giver support system in place?: Yes (comment) Current home services: DME, Sitter (Has DME cane and rolling walkers and private sitter. 24/ hr supervision.) Criminal Activity/Legal Involvement Pertinent to Current Situation/Hospitalization: No - Comment as needed  Activities of Daily Living   ADL Screening (condition at time of admission) Patient's cognitive ability adequate to safely complete daily activities?: Yes Is the patient deaf or have difficulty hearing?: Yes Does the patient have difficulty seeing, even when wearing glasses/contacts?: No Does the patient have difficulty concentrating, remembering, or making decisions?: No Patient able to express need for assistance with ADLs?: Yes Does the patient have difficulty dressing or bathing?: Yes Independently performs ADLs?: No Dressing (OT): Needs assistance Feeding: Needs assistance Is this a change from baseline?: Pre-admission baseline In/Out Bed: Needs assistance Weakness of Legs: Both Weakness of Arms/Hands: None  Permission Sought/Granted Permission sought to share information with : Family Supports, Customer service manager, Case Optician, dispensing granted to share information with : Yes, Verbal Permission Granted     Permission granted to share info w AGENCY: [facility]        Emotional Assessment Appearance:: Appears stated age Attitude/Demeanor/Rapport: Unable to Assess Affect (typically observed): Unable to Assess Orientation: : Oriented to Self Alcohol / Substance Use: Not Applicable Psych Involvement: No (comment)  Admission diagnosis:  Paroxysmal atrial fibrillation ([place]) [I48.0] Atrial fibrillation with rapid ventricular response (HCC) [I48.91] Atrial fibrillation [facility]) [I48.91] Patient Active Problem List  Diagnosis Date Noted   Pressure injury of skin 10/18/2021   Sepsis ([place])    AKI (acute  kidney injury) ([place])    Atrial fibrillation with rapid ventricular response ([place]) 10/15/2021   SBO (small bowel obstruction) ([place]) 06/27/2019   Hyperglycemia 06/27/2019   Vertigo 03/30/2017   General weakness 03/30/2017   History of recurrent UTIs 03/30/2017   Hypoglycemia 02/19/2017   UTI (urinary tract infection) 02/18/2017   Generalized weakness 02/18/2017   Hypokalemia 02/18/2017   Hypocalcemia 02/18/2017   Hypomagnesemia 02/18/2017   Atypical chest pain 11/15/2016   Chronic diarrhea    Atrial fibrillation (HCC)    Hyponatremia    Hypothyroidism    Osteoporosis    Abnormality of gait 01/11/2012   Disturbance of skin sensation 01/11/2012   Degeneration of lumbar or lumbosacral intervertebral disc 01/11/2012   Lumbosacral root lesions, not elsewhere classified 01/11/2012   Polyneuropathy in other diseases classified elsewhere ([place]) 01/11/2012   PCP:  Shon Baton, MD Pharmacy:   [facility] [place], [place] [address] AT [facility] [address] [place] [address] Phone: [phone] Fax: [phone]  [facility] [address] [place] [address] Phone: [phone] Fax: [phone]    Readmission Risk Interventions No flowsheet data found.

## 2021-10-21 LAB — PTT FACTOR INHIBITOR (MIXING STUDY)
aPTT 1:1 NP Incub. Mix Ctl: 37.5 s — ABNORMAL HIGH (ref 22.9–30.2)
aPTT 1:1 NP Mix, 60 Min,Incub.: 37.5 s — ABNORMAL HIGH (ref 22.9–30.2)
aPTT 1:1 Normal Plasma: 32.2 s — ABNORMAL HIGH (ref 22.9–30.2)
aPTT: 68.2 s — ABNORMAL HIGH (ref 22.9–30.2)

## 2021-10-21 LAB — PT FACTOR INHIBITOR (MIXING STUDY)
1 HR INCUB PT 1:1NP: 13.2 s — ABNORMAL HIGH (ref 9.1–12.0)
PT 1:1NP: 13.1 s — ABNORMAL HIGH (ref 9.1–12.0)
PT: >113.6 s — ABNORMAL HIGH (ref 9.1–12.0)

## 2021-10-22 ENCOUNTER — Encounter (HOSPITAL_BASED_OUTPATIENT_CLINIC_OR_DEPARTMENT_OTHER): Payer: Self-pay | Admitting: Emergency Medicine

## 2021-10-22 ENCOUNTER — Other Ambulatory Visit: Payer: Self-pay

## 2021-10-22 ENCOUNTER — Emergency Department (HOSPITAL_BASED_OUTPATIENT_CLINIC_OR_DEPARTMENT_OTHER): Payer: Medicare HMO

## 2021-10-22 ENCOUNTER — Emergency Department (HOSPITAL_BASED_OUTPATIENT_CLINIC_OR_DEPARTMENT_OTHER)
Admission: EM | Admit: 2021-10-22 | Discharge: 2021-10-22 | Disposition: A | Discharge status: Home / Self Care | Payer: Medicare HMO | Attending: Emergency Medicine | Admitting: Emergency Medicine

## 2021-10-22 DIAGNOSIS — N281 Cyst of kidney, acquired: Secondary | ICD-10-CM | POA: Diagnosis not present

## 2021-10-22 DIAGNOSIS — M545 Low back pain, unspecified: Secondary | ICD-10-CM | POA: Diagnosis not present

## 2021-10-22 DIAGNOSIS — K449 Diaphragmatic hernia without obstruction or gangrene: Secondary | ICD-10-CM | POA: Diagnosis not present

## 2021-10-22 DIAGNOSIS — J90 Pleural effusion, not elsewhere classified: Secondary | ICD-10-CM | POA: Diagnosis not present

## 2021-10-22 DIAGNOSIS — K5730 Diverticulosis of large intestine without perforation or abscess without bleeding: Secondary | ICD-10-CM | POA: Diagnosis not present

## 2021-10-22 DIAGNOSIS — R109 Unspecified abdominal pain: Secondary | ICD-10-CM | POA: Diagnosis present

## 2021-10-22 DIAGNOSIS — M8588 Other specified disorders of bone density and structure, other site: Secondary | ICD-10-CM | POA: Diagnosis not present

## 2021-10-22 DIAGNOSIS — M5136 Other intervertebral disc degeneration, lumbar region: Secondary | ICD-10-CM | POA: Diagnosis not present

## 2021-10-22 DIAGNOSIS — M549 Dorsalgia, unspecified: Secondary | ICD-10-CM

## 2021-10-22 DIAGNOSIS — Z7901 Long term (current) use of anticoagulants: Secondary | ICD-10-CM | POA: Diagnosis not present

## 2021-10-22 LAB — CBC WITH DIFFERENTIAL/PLATELET
Abs Immature Granulocytes: 0.21 10*3/uL — ABNORMAL HIGH (ref 0.00–0.07)
Basophils Absolute: 0 10*3/uL (ref 0.0–0.1)
Basophils Relative: 0 %
Eosinophils Absolute: 0 10*3/uL (ref 0.0–0.5)
Eosinophils Relative: 0 %
HCT: 29 % — ABNORMAL LOW (ref 36.0–46.0)
Hemoglobin: 9.5 g/dL — ABNORMAL LOW (ref 12.0–15.0)
Immature Granulocytes: 1 %
Lymphocytes Relative: 11 %
Lymphs Abs: 1.9 10*3/uL (ref 0.7–4.0)
MCH: 26.1 pg (ref 26.0–34.0)
MCHC: 32.8 g/dL (ref 30.0–36.0)
MCV: 79.7 fL — ABNORMAL LOW (ref 80.0–100.0)
Monocytes Absolute: 1.5 10*3/uL — ABNORMAL HIGH (ref 0.1–1.0)
Monocytes Relative: 9 %
Neutro Abs: 13.1 10*3/uL — ABNORMAL HIGH (ref 1.7–7.7)
Neutrophils Relative %: 79 %
Platelets: 249 10*3/uL (ref 150–400)
RBC: 3.64 MIL/uL — ABNORMAL LOW (ref 3.87–5.11)
RDW: 17.2 % — ABNORMAL HIGH (ref 11.5–15.5)
WBC: 16.8 10*3/uL — ABNORMAL HIGH (ref 4.0–10.5)
nRBC: 0 % (ref 0.0–0.2)

## 2021-10-22 LAB — URINALYSIS, ROUTINE W REFLEX MICROSCOPIC
Bilirubin Urine: NEGATIVE
Glucose, UA: NEGATIVE mg/dL
Ketones, ur: NEGATIVE mg/dL
Nitrite: NEGATIVE
Specific Gravity, Urine: 1.011 (ref 1.005–1.030)
pH: 5 (ref 5.0–8.0)

## 2021-10-22 LAB — BASIC METABOLIC PANEL
Anion gap: 10 (ref 5–15)
BUN: 29 mg/dL — ABNORMAL HIGH (ref 8–23)
CO2: 23 mmol/L (ref 22–32)
Calcium: 8.3 mg/dL — ABNORMAL LOW (ref 8.9–10.3)
Chloride: 104 mmol/L (ref 98–111)
Creatinine, Ser: 0.99 mg/dL (ref 0.44–1.00)
GFR, Estimated: 52 mL/min — ABNORMAL LOW (ref >60)
Glucose, Bld: 95 mg/dL (ref 70–99)
Potassium: 3.9 mmol/L (ref 3.5–5.1)
Sodium: 137 mmol/L (ref 135–145)

## 2021-10-22 MED ORDER — SODIUM CHLORIDE 0.9 % IV BOLUS
1000.0000 mL | Freq: Once | INTRAVENOUS | Status: AC
  Administered 2021-10-22: 1000 mL via INTRAVENOUS

## 2021-10-22 MED ORDER — TRAMADOL HCL 50 MG PO TABS: 50.0000 mg | ORAL_TABLET | Freq: Two times a day (BID) | ORAL | 0 refills | Status: AC | PRN

## 2021-10-22 MED ORDER — FENTANYL CITRATE PF 50 MCG/ML IJ SOSY
50.0000 ug | PREFILLED_SYRINGE | Freq: Once | INTRAMUSCULAR | Status: AC
  Administered 2021-10-22: 50 ug via INTRAVENOUS
  Filled 2021-10-22: qty 1

## 2021-10-22 NOTE — ED Notes (Signed)
Rolled pt. over , no visible signs of trauma, no skin breakdowns, tears or abrasions on lower backside.

## 2021-10-22 NOTE — Discharge Instructions (Addendum)
You have been seen and discharged from the emergency department.  Your blood work and urine looks appropriate.  Your CAT scan identifies chronic lower back, tailbone and pelvic fractures.  This is most likely the source of your pain.  Take Tylenol as needed for pain control.  Take stronger pain medicine as needed.  This pain medicine may cause mild itching, you can take Benadryl to help with the symptoms.  Stay well-hydrated.  Do not mix this medication with alcohol or other sedating medications. Do not drive or do heavy physical activity until you know how this medication affects you.  It may cause drowsiness.  Follow-up with your primary provider for further evaluation and further care. Take home medications as prescribed. If you have any worsening symptoms or further concerns for your health please return to an emergency department for further evaluation.

## 2021-10-22 NOTE — ED Triage Notes (Signed)
Pt c/o tailbone pain since being hospitalized this week. Her caregiver is concerned that she may have sustained an injury getting on the bedpan. She states pt screams in pain.

## 2021-10-22 NOTE — ED Provider Notes (Signed)
[facility] EMERGENCY DEPT Provider Note   CSN: [id number] Arrival date & time: 10/22/21  1009     History  Chief Complaint  Patient presents with   tailbone pain    Teresa Gonzalez is a 88 y.o. female.  HPI  88 year old female with past medical history of A. fib anticoagulated, general mental/memory decline, recent admission with UTI currently on Keflex presents emergency department concern for tailbone/lower back/left flank pain.  Patient is accompanied by family member and current caregiver.  They state since she was discharged she has been complaining of this pain.  They have concerned that while she was at the hospital or using the bedpan there could have been some sort of trauma or fracture that is causing her discomfort.  Is been taking the Keflex.  No change in her GU symptoms or recent fever, nausea/vomiting.  Patient is a poor historian and history obtained from family/caregiver but this is baseline.  No other acute change in the patient's health.  Home Medications Prior to Admission medications   Medication Sig Start Date End Date Taking? Authorizing Provider  acetaminophen (TYLENOL) 500 MG tablet Take 1,000 mg by mouth every 6 (six) hours as needed for mild pain or moderate pain.   Yes [provider]  apixaban (ELIQUIS) 2.5 MG TABS tablet Take 1 tablet (2.5 mg total) by mouth 2 (two) times daily. 10/20/21  Yes Tobb, Kardie, DO  cephALEXin (KEFLEX) 500 MG capsule Take 1 capsule (500 mg total) by mouth 3 (three) times daily for 5 days. 10/20/21 10/25/21 Yes Tobb, Kardie, DO  furosemide (LASIX) 20 MG tablet Take 20 mg by mouth daily as needed for fluid.   Yes [provider]  loperamide (IMODIUM) 2 MG capsule Take 2 mg by mouth 3 (three) times daily.   Yes [provider]  magnesium oxide (MAG-OX) 400 (241.3 Mg) MG tablet Take 1 tablet (400 mg total) by mouth daily. Patient taking differently: Take 400 mg by mouth 2 (two) times daily.  04/01/17  Yes Nita Sells, MD  Multiple Vitamins-Minerals (MULTIVITAMIN WITH MINERALS) tablet Take 1 tablet by mouth daily.   Yes [provider]  omeprazole (PRILOSEC) 40 MG capsule Take 40 mg by mouth daily. 09/04/19  Yes [provider]  raloxifene (EVISTA) 60 MG tablet Take 60 mg by mouth daily. 02/18/20  Yes [provider]  SYNTHROID 50 MCG tablet Take 50 mcg by mouth daily. 02/04/20  Yes [provider]  VITAMIN B1-B12 IJ Inject 1 vial as directed every 21 ( twenty-one) days.   Yes [provider]  Vitamin D, Ergocalciferol, (DRISDOL) 50000 UNITS CAPS Take 50,000 Units by mouth See admin instructions. Takes on Sunday, Monday, Wednesday, Friday   Yes [provider]  benzonatate (TESSALON) 200 MG capsule Take 200 mg by mouth 3 (three) times daily as needed for cough. Patient not taking: Reported on 10/22/2021 10/13/21   [provider]      Allergies    Codeine, Codeine, Penicillins, and Penicillins    Review of Systems   Review of Systems  Reason unable to perform ROS: baseline memory/mental status.   Physical Exam Updated Vital Signs BP (!) 146/73    Pulse 98    Temp 98.6 F (37 C)    Resp (!) 21    Ht 5\' 4"  (1.626 m)    Wt 45.8 kg    SpO2 95%    BMI 17.34 kg/m  Physical Exam Vitals and nursing note reviewed.  Constitutional:  General: She is not in acute distress.    Comments: Very thin and frail  HENT:     Head: Normocephalic.     Mouth/Throat:     Mouth: Mucous membranes are moist.  Cardiovascular:     Rate and Rhythm: Normal rate.  Pulmonary:     Effort: Pulmonary effort is normal. No respiratory distress.  Abdominal:     Palpations: Abdomen is soft.     Tenderness: There is no abdominal tenderness. There is no guarding.     Comments: No ecchymosis  Musculoskeletal:     Comments: Inconsistent tenderness to palpation of the lumbar midline spine and left lateral lumbar musculature, possible CVA  area, no rashes/skin breakdown/signs of trauma.  Skin:    General: Skin is warm.     Findings: No bruising or rash.  Neurological:     Mental Status: She is alert. Mental status is at baseline.    ED Results / Procedures / Treatments   Labs (all labs ordered are listed, but only abnormal results are displayed) Labs Reviewed  CBC WITH DIFFERENTIAL/PLATELET  BASIC METABOLIC PANEL  URINALYSIS, ROUTINE W REFLEX MICROSCOPIC    EKG None  Radiology No results found.  Procedures Procedures    Medications Ordered in ED Medications  sodium chloride 0.9 % bolus 1,000 mL (has no administration in time range)  fentaNYL (SUBLIMAZE) injection 50 mcg (has no administration in time range)    ED Course/ Medical Decision Making/ A&P                           Medical Decision Making  This patient presents to the ED for concern of lower back pain, this involves an extensive number of treatment options, and is a complaint that carries with it a high risk of complications and morbidity.  The differential diagnosis includes fracture, vascular abnormality, kidney stone, pyelonephritis, abdominal pathology   Additional history obtained: -Additional history obtained from family and caregiver at bedside -External records from outside source obtained and reviewed including: Chart review including previous notes, labs, imaging, consultation notes   Lab Tests: -I ordered, reviewed, and interpreted labs.  The pertinent results include: Baseline blood work, urinalysis that shows no UTI   Imaging Studies ordered: -I ordered imaging studies including CT of the lumbar spine and pelvis and CT renal study -I independently visualized and interpreted imaging which showed old fractures in the lumbar spine, pelvic and S1 -I agree with the radiologist interpretation   Medicines ordered and prescription drug management: -I ordered medication including pain medicine for symptoms -Reevaluation of the  patient after these medicines showed that the patient improved -I have reviewed the patients home medicines and have made adjustments as needed   ED Course: 88 year old female presents emergency department with lower back pain, tailbone pain and hip pain.  Patient was recently hospitalized for UTI, currently on antibiotic.  Pain started after a certain event with a bedpan at the hospital, concern for traumatic injury.  Metabolic work-up is reassuring, urinalysis shows no ongoing UTI.  CT renal study shows no acute urinary abnormality.  CT of the lumbar spine and pelvis shows old lumbar/S1 and pelvic fractures without any acute change.  This could be the source of the patient's pain from a musculoskeletal standpoint.  The bladder was large on the CAT scan, bladder scan shows no signs of urinary retention.  We are able to get a urine sample via pure wick.  No signs of  vascular abnormality in the abdomen, equal perfusion of the lower extremities.  Plan to treat the patient as musculoskeletal pain and outpatient follow-up.  Patient does get itchiness decoding, plan to try low dose and infrequent tramadol with instructions to use Benadryl as needed.   Reevaluation: After the interventions noted above, I reevaluated the patient and found that they have :improved   Dispostion: Patient at this time appears safe and stable for discharge and close outpatient follow up. Discharge plan and strict return to ED precautions discussed, patient verbalizes understanding and agreement.        Final Clinical Impression(s) / ED Diagnoses Final diagnoses:  None    Rx / DC Orders ED Discharge Orders     None         Lorelle Gibbs, DO 10/22/21 1558

## 2021-10-23 DIAGNOSIS — I248 Other forms of acute ischemic heart disease: Secondary | ICD-10-CM | POA: Diagnosis not present

## 2021-10-23 DIAGNOSIS — K529 Noninfective gastroenteritis and colitis, unspecified: Secondary | ICD-10-CM | POA: Diagnosis not present

## 2021-10-23 DIAGNOSIS — I480 Paroxysmal atrial fibrillation: Secondary | ICD-10-CM | POA: Diagnosis not present

## 2021-10-23 DIAGNOSIS — E559 Vitamin D deficiency, unspecified: Secondary | ICD-10-CM | POA: Diagnosis not present

## 2021-10-23 DIAGNOSIS — N390 Urinary tract infection, site not specified: Secondary | ICD-10-CM | POA: Diagnosis not present

## 2021-10-23 DIAGNOSIS — J155 Pneumonia due to Escherichia coli: Secondary | ICD-10-CM | POA: Diagnosis not present

## 2021-10-23 DIAGNOSIS — E039 Hypothyroidism, unspecified: Secondary | ICD-10-CM | POA: Diagnosis not present

## 2021-10-23 DIAGNOSIS — D649 Anemia, unspecified: Secondary | ICD-10-CM | POA: Diagnosis not present

## 2021-10-23 DIAGNOSIS — A4151 Sepsis due to Escherichia coli [E. coli]: Secondary | ICD-10-CM | POA: Diagnosis not present

## 2021-10-26 DIAGNOSIS — I4891 Unspecified atrial fibrillation: Secondary | ICD-10-CM | POA: Diagnosis not present

## 2021-10-26 DIAGNOSIS — A419 Sepsis, unspecified organism: Secondary | ICD-10-CM | POA: Diagnosis not present

## 2021-10-26 DIAGNOSIS — I480 Paroxysmal atrial fibrillation: Secondary | ICD-10-CM | POA: Diagnosis not present

## 2021-10-26 DIAGNOSIS — J189 Pneumonia, unspecified organism: Secondary | ICD-10-CM | POA: Diagnosis not present

## 2021-10-26 DIAGNOSIS — R791 Abnormal coagulation profile: Secondary | ICD-10-CM | POA: Diagnosis not present

## 2021-10-26 DIAGNOSIS — N390 Urinary tract infection, site not specified: Secondary | ICD-10-CM | POA: Diagnosis not present

## 2021-10-26 DIAGNOSIS — R778 Other specified abnormalities of plasma proteins: Secondary | ICD-10-CM | POA: Diagnosis not present

## 2021-10-26 DIAGNOSIS — N179 Acute kidney failure, unspecified: Secondary | ICD-10-CM | POA: Diagnosis not present

## 2021-10-26 DIAGNOSIS — I5022 Chronic systolic (congestive) heart failure: Secondary | ICD-10-CM | POA: Diagnosis not present

## 2021-10-29 ENCOUNTER — Telehealth (HOSPITAL_COMMUNITY): Payer: Self-pay | Admitting: Pharmacist

## 2021-10-29 ENCOUNTER — Other Ambulatory Visit (HOSPITAL_COMMUNITY): Payer: Self-pay

## 2021-10-29 NOTE — Telephone Encounter (Signed)
Pharmacy Transitions of Care Follow-up Telephone Call  Date of discharge: 10/20/21  Discharge Diagnosis: a.fib  How have you been since you were released from the hospital? Patient has been placed on hospice since discharge, spoke with patient's daughter, Thayer Headings, and her in-home caretaker, Helene Kelp.    Medication changes made at discharge:  - START: Eliquis   Medication changes verified by the patient? yes    Medication Accessibility:  Home Pharmacy: did not discuss   Was the patient provided with refills on discharged medications? yes   Have all prescriptions been transferred from [facility] to home pharmacy? no   Is the patient able to afford medications? Under hospice care Notable copays: eliquis    Medication Review:  APIXABAN (ELIQUIS)  Apixaban 2.5 mg BID initiated on 10/20/21  - Discussed importance of taking medication around the same time everyday  - Reviewed potential DDIs with patient  - Advised patient of medications to avoid (NSAIDs, ASA)  - Educated that Tylenol (acetaminophen) will be the preferred analgesic to prevent risk of bleeding  - Emphasized importance of monitoring for signs and symptoms of bleeding (abnormal bruising, prolonged bleeding, nose bleeds, bleeding from gums, discolored urine, black tarry stools)  - Advised patient to alert all providers of anticoagulation therapy prior to starting a new medication or having a procedure    Follow-up Appointments:  Pt is now being cared for by Hospice.    Final Patient Assessment: Spoke with patient's daughter and caretaker.  They are aware of medications and report compliance.  No bleeding or adverse events at this time. Patient is now under hospice care.

## 2021-11-03 ENCOUNTER — Ambulatory Visit (HOSPITAL_COMMUNITY): Payer: Medicare HMO | Admitting: Physician Assistant

## 2021-11-11 DEATH — deceased

## 2021-12-01 ENCOUNTER — Telehealth: Payer: Self-pay | Admitting: Hematology

## 2021-12-01 NOTE — Telephone Encounter (Signed)
Called pt to sch a hospital f/u per 2/21 staff msg from Dr. Burr Medico. Pt did not answer. Left msg for pt to call back to sch this appt.

## 2022-05-18 ENCOUNTER — Other Ambulatory Visit (HOSPITAL_COMMUNITY): Payer: Self-pay
